# Patient Record
Sex: Female | Born: 1945
Health system: Southern US, Community
[De-identification: ages and names within clinical notes are randomized; demographics above are authoritative.]

## PROBLEM LIST (undated history)

## (undated) DIAGNOSIS — M810 Age-related osteoporosis without current pathological fracture: Secondary | ICD-10-CM

## (undated) DIAGNOSIS — D7289 Other specified disorders of white blood cells: Secondary | ICD-10-CM

## (undated) DIAGNOSIS — E876 Hypokalemia: Secondary | ICD-10-CM

## (undated) DIAGNOSIS — M1990 Unspecified osteoarthritis, unspecified site: Secondary | ICD-10-CM

## (undated) DIAGNOSIS — G43909 Migraine, unspecified, not intractable, without status migrainosus: Secondary | ICD-10-CM

## (undated) DIAGNOSIS — J4489 Other specified chronic obstructive pulmonary disease: Secondary | ICD-10-CM

## (undated) DIAGNOSIS — F32A Depression, unspecified: Secondary | ICD-10-CM

## (undated) DIAGNOSIS — J438 Other emphysema: Secondary | ICD-10-CM

## (undated) DIAGNOSIS — M7750 Other enthesopathy of unspecified foot: Secondary | ICD-10-CM

## (undated) DIAGNOSIS — H269 Unspecified cataract: Secondary | ICD-10-CM

## (undated) DIAGNOSIS — I10 Essential (primary) hypertension: Secondary | ICD-10-CM

## (undated) DIAGNOSIS — E785 Hyperlipidemia, unspecified: Secondary | ICD-10-CM

## (undated) DIAGNOSIS — R930 Abnormal findings on diagnostic imaging of skull and head, not elsewhere classified: Secondary | ICD-10-CM

## (undated) DIAGNOSIS — F329 Major depressive disorder, single episode, unspecified: Secondary | ICD-10-CM

## (undated) DIAGNOSIS — R002 Palpitations: Secondary | ICD-10-CM

## (undated) DIAGNOSIS — R0902 Hypoxemia: Secondary | ICD-10-CM

## (undated) DIAGNOSIS — F3289 Other specified depressive episodes: Secondary | ICD-10-CM

## (undated) DIAGNOSIS — D72829 Elevated white blood cell count, unspecified: Secondary | ICD-10-CM

## (undated) DIAGNOSIS — G709 Myoneural disorder, unspecified: Secondary | ICD-10-CM

## (undated) DIAGNOSIS — J439 Emphysema, unspecified: Secondary | ICD-10-CM

## (undated) DIAGNOSIS — Z720 Tobacco use: Secondary | ICD-10-CM

## (undated) DIAGNOSIS — A770 Spotted fever due to Rickettsia rickettsii: Secondary | ICD-10-CM

## (undated) DIAGNOSIS — E079 Disorder of thyroid, unspecified: Secondary | ICD-10-CM

## (undated) DIAGNOSIS — J449 Chronic obstructive pulmonary disease, unspecified: Secondary | ICD-10-CM

## (undated) HISTORY — DX: Other specified chronic obstructive pulmonary disease: J44.89

## (undated) HISTORY — DX: Other enthesopathy of unspecified foot and ankle: M77.50

## (undated) HISTORY — DX: Essential (primary) hypertension: I10

## (undated) HISTORY — DX: Migraine, unspecified, not intractable, without status migrainosus: G43.909

## (undated) HISTORY — DX: Other emphysema: J43.8

## (undated) HISTORY — DX: Myoneural disorder, unspecified: G70.9

## (undated) HISTORY — DX: Other specified disorders of white blood cells: D72.89

## (undated) HISTORY — DX: Elevated white blood cell count, unspecified: D72.829

## (undated) HISTORY — DX: Palpitations: R00.2

## (undated) HISTORY — DX: Hypokalemia: E87.6

## (undated) HISTORY — DX: Unspecified cataract: H26.9

## (undated) HISTORY — PX: TUBAL LIGATION: SHX77

## (undated) HISTORY — DX: Age-related osteoporosis without current pathological fracture: M81.0

## (undated) HISTORY — DX: Other specified depressive episodes: F32.89

## (undated) HISTORY — DX: Abnormal findings on diagnostic imaging of skull and head, not elsewhere classified: R93.0

## (undated) HISTORY — DX: Major depressive disorder, single episode, unspecified: F32.9

## (undated) HISTORY — DX: Unspecified osteoarthritis, unspecified site: M19.90

## (undated) HISTORY — DX: Hypoxemia: R09.02

## (undated) HISTORY — DX: Hyperlipidemia, unspecified: E78.5

## (undated) HISTORY — DX: Disorder of thyroid, unspecified: E07.9

## (undated) HISTORY — PX: EYE SURGERY: SHX253

## (undated) HISTORY — DX: Emphysema, unspecified: J43.9

## (undated) HISTORY — DX: Depression, unspecified: F32.A

## (undated) HISTORY — DX: Tobacco use: Z72.0

## (undated) HISTORY — PX: FRACTURE SURGERY: SHX138

## (undated) HISTORY — DX: Chronic obstructive pulmonary disease, unspecified: J44.9

---
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
## 1997-09-18 ENCOUNTER — Emergency Department (HOSPITAL_COMMUNITY): Admission: EM | Admit: 1997-09-18 | Discharge: 1997-09-18 | Payer: Self-pay | Admitting: Emergency Medicine

## 1998-08-02 ENCOUNTER — Emergency Department (HOSPITAL_COMMUNITY): Admission: EM | Admit: 1998-08-02 | Discharge: 1998-08-02 | Payer: Self-pay | Admitting: Emergency Medicine

## 1999-07-24 ENCOUNTER — Emergency Department (HOSPITAL_COMMUNITY): Admission: EM | Admit: 1999-07-24 | Discharge: 1999-07-24 | Payer: Self-pay | Admitting: Emergency Medicine

## 1999-07-24 ENCOUNTER — Encounter: Payer: Self-pay | Admitting: Emergency Medicine

## 2000-02-15 ENCOUNTER — Other Ambulatory Visit: Admission: RE | Admit: 2000-02-15 | Discharge: 2000-02-15 | Payer: Self-pay | Admitting: Gynecology

## 2000-08-07 ENCOUNTER — Encounter: Admission: RE | Admit: 2000-08-07 | Discharge: 2000-08-07 | Payer: Self-pay | Admitting: Gynecology

## 2000-08-07 ENCOUNTER — Encounter: Payer: Self-pay | Admitting: Gynecology

## 2000-12-27 ENCOUNTER — Encounter: Payer: Self-pay | Admitting: Emergency Medicine

## 2000-12-27 ENCOUNTER — Inpatient Hospital Stay (HOSPITAL_COMMUNITY): Admission: EM | Admit: 2000-12-27 | Discharge: 2000-12-30 | Payer: Self-pay | Admitting: Emergency Medicine

## 2001-08-12 ENCOUNTER — Encounter: Payer: Self-pay | Admitting: Internal Medicine

## 2001-08-12 ENCOUNTER — Encounter: Admission: RE | Admit: 2001-08-12 | Discharge: 2001-08-12 | Payer: Self-pay | Admitting: Internal Medicine

## 2002-07-16 ENCOUNTER — Other Ambulatory Visit: Admission: RE | Admit: 2002-07-16 | Discharge: 2002-07-16 | Payer: Self-pay | Admitting: Gynecology

## 2002-08-15 ENCOUNTER — Encounter: Payer: Self-pay | Admitting: Gynecology

## 2002-08-15 ENCOUNTER — Encounter: Admission: RE | Admit: 2002-08-15 | Discharge: 2002-08-15 | Payer: Self-pay | Admitting: Gynecology

## 2003-04-12 ENCOUNTER — Emergency Department (HOSPITAL_COMMUNITY): Admission: EM | Admit: 2003-04-12 | Discharge: 2003-04-12 | Payer: Self-pay | Admitting: Emergency Medicine

## 2003-07-20 ENCOUNTER — Other Ambulatory Visit: Admission: RE | Admit: 2003-07-20 | Discharge: 2003-07-20 | Payer: Self-pay | Admitting: Gynecology

## 2003-09-04 ENCOUNTER — Encounter: Admission: RE | Admit: 2003-09-04 | Discharge: 2003-09-04 | Payer: Self-pay | Admitting: Gynecology

## 2005-03-29 ENCOUNTER — Encounter: Admission: RE | Admit: 2005-03-29 | Discharge: 2005-03-29 | Payer: Self-pay | Admitting: Gynecology

## 2005-03-29 ENCOUNTER — Other Ambulatory Visit: Admission: RE | Admit: 2005-03-29 | Discharge: 2005-03-29 | Payer: Self-pay | Admitting: Gynecology

## 2005-09-06 ENCOUNTER — Encounter: Admission: RE | Admit: 2005-09-06 | Discharge: 2005-09-06 | Payer: Self-pay | Admitting: Orthopedic Surgery

## 2006-04-23 ENCOUNTER — Encounter: Admission: RE | Admit: 2006-04-23 | Discharge: 2006-04-23 | Payer: Self-pay | Admitting: Gynecology

## 2006-04-23 ENCOUNTER — Other Ambulatory Visit: Admission: RE | Admit: 2006-04-23 | Discharge: 2006-04-23 | Payer: Self-pay | Admitting: Gynecology

## 2007-05-01 ENCOUNTER — Encounter: Admission: RE | Admit: 2007-05-01 | Discharge: 2007-05-01 | Payer: Self-pay | Admitting: Gynecology

## 2007-07-10 LAB — HM DEXA SCAN

## 2008-05-07 ENCOUNTER — Encounter: Admission: RE | Admit: 2008-05-07 | Discharge: 2008-05-07 | Payer: Self-pay | Admitting: Gynecology

## 2008-07-22 ENCOUNTER — Ambulatory Visit: Payer: Self-pay | Admitting: Internal Medicine

## 2008-07-23 ENCOUNTER — Telehealth: Payer: Self-pay | Admitting: Internal Medicine

## 2008-08-12 ENCOUNTER — Telehealth: Payer: Self-pay | Admitting: Internal Medicine

## 2008-08-14 ENCOUNTER — Ambulatory Visit: Payer: Self-pay | Admitting: Internal Medicine

## 2008-08-14 HISTORY — PX: COLONOSCOPY: SHX174

## 2009-04-06 LAB — HM COLONOSCOPY: HM Colonoscopy: NORMAL

## 2009-05-10 ENCOUNTER — Encounter: Admission: RE | Admit: 2009-05-10 | Discharge: 2009-05-10 | Payer: Self-pay | Admitting: Gynecology

## 2010-06-03 ENCOUNTER — Other Ambulatory Visit: Payer: Self-pay | Admitting: Gynecology

## 2010-06-03 DIAGNOSIS — Z1231 Encounter for screening mammogram for malignant neoplasm of breast: Secondary | ICD-10-CM

## 2010-06-21 ENCOUNTER — Ambulatory Visit
Admission: RE | Admit: 2010-06-21 | Discharge: 2010-06-21 | Disposition: A | Discharge status: Home / Self Care | Payer: Medicare Other | Source: Ambulatory Visit | Attending: Gynecology | Admitting: Gynecology

## 2010-06-21 DIAGNOSIS — Z1231 Encounter for screening mammogram for malignant neoplasm of breast: Secondary | ICD-10-CM

## 2010-08-19 NOTE — Discharge Summary (Signed)
[place]  Patient:    BARB, SHEAR Visit Number: [id number] MRN: [id number]          Service Type: TRA Location: [id number] Attending Physician:  Trauma, Md Dictated by:   Shawn Rayburn, P.A. Admit Date:  12/26/2000 Discharge Date: 12/30/2000                             Discharge Summary  ADMITTING TRAUMA SURGEON:  Dr. Johna Sheriff.  CONSULTANTS:  None.  DISCHARGE DIAGNOSES: 1. Blunt abdominal and chest trauma. 2. Spleen laceration. 3. Liver laceration. 4. Left eighth rib fracture. 5. Pharyngitis.  HISTORY:  This is a 65 year old female who was kicked by a horse in the left upper quadrant at approximately 9:30 p.m. on the day of admission, December 27, 2000.  She was brought to the [facility] Emergency Room by EMS with stable vital signs and complaining of diffuse abdominal pain.  On exam she had moderate diffuse tenderness about the abdomen and a bruise over the left upper quadrant.  Workup at this time, chest x-ray showed a fracture of the left eighth rib with no pneumothorax.  CT of the abdomen showed a minor laceration to the left lobe of the liver and laceration to the lower pole of the spleen with moderate blood in the pelvis.  The patient was admitted for observation for bleeding and occult bowel injury as well as for pain control.  The patient remained hemodynamically stable throughout this admission.  Her hemoglobin and hematocrit have stabilized with a hemoglobin of 11.9, hematocrit of 35.1, admission hemoglobin was 14.0, hematocrit 42.0.  White blood cell count 9100 and platelets of 243,000.  The patient was mobilized and tolerated this well.  She is taking Darvocet only for pain.  Her main complaint at the time of discharge was pharyngitis.  She did have some blistering and erythema, but no exudates in her throat.  This was cultured and the patient will be empirically sent home on amoxicillin.  DISCHARGE  MEDICATIONS:  Other medications at the time of discharge include: 1. Darvocet-N 100 one to two p.o. q.4-6h. p.r.n. pain. 2. Prozac per usual home dose.  ACTIVITY:  To tolerance.  No driving until follow up with trauma service.  FOLLOW-UP:  Follow up with trauma service on October 8, at 9:30 in the morning. Dictated by:   Shawn Rayburn, P.A. Attending Physician:  Trauma, Md DD:  12/30/00 TD:  12/30/00 Job: [id number]

## 2010-09-25 ENCOUNTER — Emergency Department (HOSPITAL_COMMUNITY): Payer: Medicare Other

## 2010-09-25 ENCOUNTER — Emergency Department (HOSPITAL_COMMUNITY)
Admission: EM | Admit: 2010-09-25 | Discharge: 2010-09-25 | Disposition: A | Discharge status: Home / Self Care | Payer: Medicare Other | Attending: Emergency Medicine | Admitting: Emergency Medicine

## 2010-09-25 DIAGNOSIS — R059 Cough, unspecified: Secondary | ICD-10-CM | POA: Insufficient documentation

## 2010-09-25 DIAGNOSIS — I10 Essential (primary) hypertension: Secondary | ICD-10-CM | POA: Insufficient documentation

## 2010-09-25 DIAGNOSIS — J4489 Other specified chronic obstructive pulmonary disease: Secondary | ICD-10-CM | POA: Insufficient documentation

## 2010-09-25 DIAGNOSIS — F17200 Nicotine dependence, unspecified, uncomplicated: Secondary | ICD-10-CM | POA: Insufficient documentation

## 2010-09-25 DIAGNOSIS — R05 Cough: Secondary | ICD-10-CM | POA: Insufficient documentation

## 2010-09-25 DIAGNOSIS — E876 Hypokalemia: Secondary | ICD-10-CM | POA: Insufficient documentation

## 2010-09-25 DIAGNOSIS — J449 Chronic obstructive pulmonary disease, unspecified: Secondary | ICD-10-CM | POA: Insufficient documentation

## 2010-09-25 DIAGNOSIS — R0602 Shortness of breath: Secondary | ICD-10-CM | POA: Insufficient documentation

## 2010-09-25 DIAGNOSIS — E7800 Pure hypercholesterolemia, unspecified: Secondary | ICD-10-CM | POA: Insufficient documentation

## 2010-09-25 LAB — DIFFERENTIAL
Basophils Absolute: 0 10*3/uL (ref 0.0–0.1)
Basophils Relative: 0 % (ref 0–1)
Eosinophils Absolute: 0.3 10*3/uL (ref 0.0–0.7)
Eosinophils Relative: 2 % (ref 0–5)
Lymphocytes Relative: 26 % (ref 12–46)
Lymphs Abs: 3 10*3/uL (ref 0.7–4.0)
Monocytes Absolute: 0.7 10*3/uL (ref 0.1–1.0)
Monocytes Relative: 6 % (ref 3–12)
Neutro Abs: 7.4 10*3/uL (ref 1.7–7.7)
Neutrophils Relative %: 65 % (ref 43–77)

## 2010-09-25 LAB — D-DIMER, QUANTITATIVE: D-Dimer, Quant: 0.32 ug/mL-FEU (ref 0.00–0.48)

## 2010-09-25 LAB — CBC
HCT: 42 % (ref 36.0–46.0)
Hemoglobin: 14.7 g/dL (ref 12.0–15.0)
MCH: 31.7 pg (ref 26.0–34.0)
MCHC: 35 g/dL (ref 30.0–36.0)
RBC: 4.64 MIL/uL (ref 3.87–5.11)
RDW: 13.2 % (ref 11.5–15.5)

## 2010-09-25 LAB — BASIC METABOLIC PANEL
BUN: 13 mg/dL (ref 6–23)
CO2: 27 mEq/L (ref 19–32)
Calcium: 8.9 mg/dL (ref 8.4–10.5)
Chloride: 101 mEq/L (ref 96–112)
Creatinine, Ser: 0.8 mg/dL (ref 0.50–1.10)
GFR calc Af Amer: >60 mL/min (ref >60)
GFR calc non Af Amer: >60 mL/min (ref >60)
Glucose, Bld: 143 mg/dL — ABNORMAL HIGH (ref 70–99)
Sodium: 138 mEq/L (ref 135–145)

## 2010-09-25 LAB — CK TOTAL AND CKMB (NOT AT ARMC)
CK, MB: 1.8 ng/mL (ref 0.3–4.0)
Total CK: 65 U/L (ref 7–177)

## 2010-09-25 LAB — PRO B NATRIURETIC PEPTIDE: Pro B Natriuretic peptide (BNP): 55.9 pg/mL (ref 0–125)

## 2010-09-25 LAB — TROPONIN I: Troponin I: <0.3 ng/mL (ref <0.30)

## 2010-10-27 ENCOUNTER — Ambulatory Visit
Admission: RE | Admit: 2010-10-27 | Discharge: 2010-10-27 | Disposition: A | Discharge status: Home / Self Care | Payer: Medicare Other | Source: Ambulatory Visit | Attending: Internal Medicine | Admitting: Internal Medicine

## 2010-10-27 ENCOUNTER — Other Ambulatory Visit: Payer: Self-pay | Admitting: Internal Medicine

## 2010-10-27 DIAGNOSIS — Z09 Encounter for follow-up examination after completed treatment for conditions other than malignant neoplasm: Secondary | ICD-10-CM

## 2010-11-09 ENCOUNTER — Encounter: Payer: Self-pay | Admitting: Internal Medicine

## 2010-11-10 ENCOUNTER — Ambulatory Visit (INDEPENDENT_AMBULATORY_CARE_PROVIDER_SITE_OTHER): Payer: Medicare Other | Admitting: Internal Medicine

## 2010-11-10 ENCOUNTER — Encounter: Payer: Self-pay | Admitting: Internal Medicine

## 2010-11-10 VITALS — BP 118/82 | HR 81 | Temp 98.0°F | Ht 65.0 in | Wt 154.8 lb

## 2010-11-10 DIAGNOSIS — D72829 Elevated white blood cell count, unspecified: Secondary | ICD-10-CM

## 2010-11-10 DIAGNOSIS — F17210 Nicotine dependence, cigarettes, uncomplicated: Secondary | ICD-10-CM | POA: Insufficient documentation

## 2010-11-10 DIAGNOSIS — Z720 Tobacco use: Secondary | ICD-10-CM

## 2010-11-10 DIAGNOSIS — F17200 Nicotine dependence, unspecified, uncomplicated: Secondary | ICD-10-CM

## 2010-11-10 DIAGNOSIS — J449 Chronic obstructive pulmonary disease, unspecified: Secondary | ICD-10-CM

## 2010-11-10 DIAGNOSIS — J4489 Other specified chronic obstructive pulmonary disease: Secondary | ICD-10-CM

## 2010-11-10 NOTE — Assessment & Plan Note (Signed)
#  COPD I think copd/emphysema explains shortness of breath and coughing Nurse will walk you for oxygen levels Please have full PFT breathing test and return to see me; if my appointment is too far out you can see me NP if you wish just to get started on medication

## 2010-11-10 NOTE — Patient Instructions (Signed)
#  COPD I think copd/emphysema explains shortness of breath and coughing Nurse will walk you for oxygen levels Please have full PFT breathing test and return to see me; if my appointment is too far out you can see me NP if you wish just to get started on medication #High white count  - not sure if related to lungs  - please follow with hematologist #Smoking - please work on quitting smoking  - write down some ways you can cut down smoking or quit and bring it with you at next visit #Followup - after PFT

## 2010-11-10 NOTE — Assessment & Plan Note (Signed)
#  High white count  - not sure if related to lungs  - please follow with hematologist

## 2010-11-10 NOTE — Progress Notes (Signed)
Subjective:    Patient ID: Gabrielle White, female    DOB: [birth year], 65 y.o.   MRN: [id number]  HPI IOV 11/10/2010: 65 year old female. Smoker. Referred by Dr. Allena Katz for possible copd.   Reports insidious onset chronic cough for few years. Slowly progressive. Moderate in severity. Associated thin white sputum + of small amounts present. Associated dogs, cats and horses present but she is not sure that this makes cough worse though she feels that the house she has lived in for past 5 years might be related to cough onset though she does not know what. States house is clean though dog hair present. Cough made worse occ by lying down and cig smoke but not always. Cough improved by a 5 day prednisone course in end June 2012 when she went to ER for acute dyspnea. Of note, Kouffman Reflux Symptom Index Score (RSI) is 11 and therefore against dx of LPR.   Also, reports 1  Year chronic dyspnea of insidious onset. Heat and activity makes dyspnea worse. Walking dogs 1/2 mile is no problem but any heavier activity more than walking makes her more dyspneic. Also, progressive. Rates dyspnea as moderate. Improved by rest  On 09/25/2010 wennt to ER with acute dyspnea after being in horse stall - ddimer, ck, ekg, trop (reviewed) all normal. CXR showed emphysema and was sent home on 5 day prednisone that helped immensely ("oh yeah"). Note, did not desaturate walking 185 feet x 3 laps in office  Ongoing tobacco abuse. Quit for 15 years at age 31. 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 Interested in quitting smoking. Has taken chantix in past but does not remember if any problems with it.   Denies associated chest pain, gerd, edema, sputum, inhaler intake but noted to have WC 11.5k - 15K past 2 months on labs. Heme cx pending per hx   Review of Systems  Constitutional: Negative for fever and unexpected weight change.  HENT: Negative for ear pain, nosebleeds,  congestion, sore throat, rhinorrhea, sneezing, trouble swallowing, dental problem, postnasal drip and sinus pressure.   Eyes: Negative for redness and itching.  Respiratory: Positive for cough and shortness of breath. Negative for chest tightness and wheezing.   Cardiovascular: Negative for palpitations and leg swelling.  Gastrointestinal: Negative for nausea and vomiting.  Genitourinary: Negative for dysuria.  Musculoskeletal: Negative for joint swelling.  Skin: Negative for rash.  Neurological: Negative for headaches.  Hematological: Does not bruise/bleed easily.  Psychiatric/Behavioral: Negative for dysphoric mood. The patient is not nervous/anxious.        Objective:   Physical Exam  Vitals reviewed. Constitutional: She is oriented to person, place, and time. She appears well-developed and well-nourished. No distress.  HENT:  Head: Normocephalic and atraumatic.  Right Ear: External ear normal.  Left Ear: External ear normal.  Mouth/Throat: Oropharynx is clear and moist. No oropharyngeal exudate.  Eyes: Conjunctivae and EOM are normal. Pupils are equal, round, and reactive to light. Right eye exhibits no discharge. Left eye exhibits no discharge. No scleral icterus.  Neck: Normal range of motion. Neck supple. No JVD present. No tracheal deviation present. No thyromegaly present.  Cardiovascular: Normal rate, regular rhythm, normal heart sounds and intact distal pulses.  Exam reveals no gallop and no friction rub.   No murmur heard. Pulmonary/Chest: Effort normal and breath sounds normal. No respiratory distress. She has no wheezes. She has no rales. She exhibits no tenderness.  Abdominal: Soft. Bowel sounds are  normal. She exhibits no distension and no mass. There is no tenderness. There is no rebound and no guarding.  Musculoskeletal: Normal range of motion. She exhibits no edema and no tenderness.  Lymphadenopathy:    She has no cervical adenopathy.  Neurological: She is alert  and oriented to person, place, and time. She has normal reflexes. No cranial nerve deficit. She exhibits normal muscle tone. Coordination normal.  Skin: Skin is warm and dry. No rash noted. She is not diaphoretic. No erythema. No pallor.  Psychiatric: She has a normal mood and affect. Her behavior is normal. Judgment and thought content normal.          Assessment & Plan:

## 2010-11-10 NOTE — Assessment & Plan Note (Signed)
#  Smoking - please work on quitting smoking  - write down some ways you can cut down smoking or quit and bring it with you at next visit #Followup - after PFT

## 2010-11-18 ENCOUNTER — Encounter: Payer: Self-pay | Admitting: Adult Health

## 2010-11-18 ENCOUNTER — Other Ambulatory Visit (HOSPITAL_COMMUNITY): Payer: Self-pay | Admitting: Oncology

## 2010-11-18 ENCOUNTER — Encounter (HOSPITAL_BASED_OUTPATIENT_CLINIC_OR_DEPARTMENT_OTHER): Payer: Medicare Other | Admitting: Oncology

## 2010-11-18 ENCOUNTER — Ambulatory Visit (INDEPENDENT_AMBULATORY_CARE_PROVIDER_SITE_OTHER): Payer: Medicare Other | Admitting: Internal Medicine

## 2010-11-18 ENCOUNTER — Ambulatory Visit (INDEPENDENT_AMBULATORY_CARE_PROVIDER_SITE_OTHER): Payer: Medicare Other | Admitting: Adult Health

## 2010-11-18 VITALS — BP 112/74 | HR 71 | Temp 97.3°F | Ht 64.0 in | Wt 156.0 lb

## 2010-11-18 DIAGNOSIS — J449 Chronic obstructive pulmonary disease, unspecified: Secondary | ICD-10-CM

## 2010-11-18 DIAGNOSIS — R739 Hyperglycemia, unspecified: Secondary | ICD-10-CM | POA: Insufficient documentation

## 2010-11-18 DIAGNOSIS — D72829 Elevated white blood cell count, unspecified: Secondary | ICD-10-CM

## 2010-11-18 LAB — CBC WITH DIFFERENTIAL/PLATELET
BASO%: 1.1 % (ref 0.0–2.0)
EOS%: 2 % (ref 0.0–7.0)
HCT: 40.7 % (ref 34.8–46.6)
LYMPH%: 28.3 % (ref 14.0–49.7)
MCH: 32 pg (ref 25.1–34.0)
MCHC: 34.2 g/dL (ref 31.5–36.0)
MONO#: 0.5 10*3/uL (ref 0.1–0.9)
MONO%: 4.2 % (ref 0.0–14.0)
NEUT%: 64.4 % (ref 38.4–76.8)
Platelets: 235 10*3/uL (ref 145–400)
RBC: 4.35 10*6/uL (ref 3.70–5.45)
WBC: 11.3 10*3/uL — ABNORMAL HIGH (ref 3.9–10.3)

## 2010-11-18 LAB — PULMONARY FUNCTION TEST

## 2010-11-18 LAB — CHCC SMEAR

## 2010-11-18 NOTE — Progress Notes (Signed)
PFT done today. 

## 2010-11-18 NOTE — Patient Instructions (Addendum)
Most important goal is to quit smoking  Try a lot of the helpful quit smoking suggestions we discussed.  follow up Dr. Marchelle Gearing in 3 months and As needed

## 2010-11-19 LAB — SEDIMENTATION RATE: Sed Rate: 1 mm/h (ref 0–22)

## 2010-11-19 LAB — COMPREHENSIVE METABOLIC PANEL WITH GFR
ALT: 12 U/L (ref 0–35)
AST: 14 U/L (ref 0–37)
Albumin: 4.1 g/dL (ref 3.5–5.2)
Alkaline Phosphatase: 90 U/L (ref 39–117)
BUN: 11 mg/dL (ref 6–23)
CO2: 29 meq/L (ref 19–32)
Calcium: 9.4 mg/dL (ref 8.4–10.5)
Chloride: 101 meq/L (ref 96–112)
Creatinine, Ser: 0.82 mg/dL (ref 0.50–1.10)
Glucose, Bld: 162 mg/dL — ABNORMAL HIGH (ref 70–99)
Potassium: 3.7 meq/L (ref 3.5–5.3)
Sodium: 141 meq/L (ref 135–145)
Total Bilirubin: 0.5 mg/dL (ref 0.3–1.2)
Total Protein: 6.2 g/dL (ref 6.0–8.3)

## 2010-11-24 NOTE — Progress Notes (Signed)
Subjective:    Patient ID: Gabrielle White, female    DOB: 12-05-1945, 65 y.o.   MRN: [id number]  HPI IOV 11/10/2010: 65 year old female. Smoker. Referred by Dr. Allena Katz for possible copd.   Reports insidious onset chronic cough for few years. Slowly progressive. Moderate in severity. Associated thin white sputum + of small amounts present. Associated dogs, cats and horses present but she is not sure that this makes cough worse though she feels that the house she has lived in for past 5 years might be related to cough onset though she does not know what. States house is clean though dog hair present. Cough made worse occ by lying down and cig smoke but not always. Cough improved by a 5 day prednisone course in end June 2012 when she went to ER for acute dyspnea. Of note, Kouffman Reflux Symptom Index Score (RSI) is 11 and therefore against dx of LPR.  Also, reports 1  Year chronic dyspnea of insidious onset. Heat and activity makes dyspnea worse. Walking dogs 1/2 mile is no problem but any heavier activity more than walking makes her more dyspneic. Also, progressive. Rates dyspnea as moderate. Improved by rest  On   09/25/2010 wennt to ER with acute dyspnea after being in horse stall - ddimer, ck, ekg, trop (reviewed) all normal. CXR showed emphysema and was sent home on 5 day prednisone that helped immensely ("oh yeah"). Note, did not desaturate walking 185 feet x 3 laps in office Ongoing tobacco abuse. Quit for 15 years at age 30. 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 Interested in quitting smoking. Has taken chantix in past but does not remember if any problems with it.  Denies associated chest pain, gerd, edema, sputum, inhaler intake but noted to have WC 11.5k - 15K past 2 months on labs. Heme cx pending per hx  11/18/10 Follow up and PFT review Pt returns for follow up . Today PFTs showed. FEV1 of 1.91 L (90%), ratio of 73, DLCO 75%, no sign change  with SABA .  She continues to smoke. We discussed several options for smoking cesstation w/ pt education .  She is maintained on Symbicort and tolerating well with no increased use of SABA .   Review of Systems  Constitutional: Negative for fever and unexpected weight change.  HENT: Negative for ear pain, nosebleeds, congestion, sore throat, rhinorrhea, sneezing, trouble swallowing, dental problem, postnasal drip and sinus pressure.   Eyes: Negative for redness and itching.  Respiratory: Positive for cough and shortness of breath. Negative for chest tightness and wheezing.   Cardiovascular: Negative for palpitations and leg swelling.  Gastrointestinal: Negative for nausea and vomiting.  Genitourinary: Negative for dysuria.  Musculoskeletal: Negative for joint swelling.  Skin: Negative for rash.  Neurological: Negative for headaches.  Hematological: Does not bruise/bleed easily.  Psychiatric/Behavioral: Negative for dysphoric mood. The patient is not nervous/anxious.        Objective:   Physical Exam  GEN: A/Ox3; pleasant , NAD, well nourished   HEENT:  [place]/AT,  EACs-clear, TMs-wnl, NOSE-clear, THROAT-clear, no lesions, no postnasal drip or exudate noted.   NECK:  Supple w/ fair ROM; no JVD; normal carotid impulses w/o bruits; no thyromegaly or nodules palpated; no lymphadenopathy.  RESP  Clear  P & A; w/o, wheezes/ rales/ or rhonchi.no accessory muscle use, no dullness to percussion  CARD:  RRR, no m/r/g  , no peripheral edema, pulses intact, no cyanosis or clubbing.  GI:   Soft & nt; nml bowel sounds; no organomegaly or masses detected.  Musco: Warm bil, no deformities or joint swelling noted.   Neuro: alert, no focal deficits noted.    Skin: Warm, no lesions or rashes          Assessment & Plan:

## 2010-11-24 NOTE — Assessment & Plan Note (Addendum)
PFT today shows preserved lung fxn . We discussed that her main goal is to quit smoking.  Long discussion on smoking cesstation   Plan:  Most important goal is to quit smoking  Try a lot of the helpful quit smoking suggestions we discussed.  follow up Dr. Marchelle Gearing in 3 months and As needed

## 2010-11-25 NOTE — Progress Notes (Signed)
Reviweed spirometry which is normal ? Effect of drug. Smoking cessation main issue . I agree with NP Plan

## 2010-12-13 ENCOUNTER — Encounter: Payer: Self-pay | Admitting: Internal Medicine

## 2010-12-19 ENCOUNTER — Encounter (HOSPITAL_BASED_OUTPATIENT_CLINIC_OR_DEPARTMENT_OTHER): Payer: Medicare Other | Admitting: Oncology

## 2010-12-19 ENCOUNTER — Other Ambulatory Visit (HOSPITAL_COMMUNITY): Payer: Self-pay | Admitting: Oncology

## 2010-12-19 DIAGNOSIS — D72829 Elevated white blood cell count, unspecified: Secondary | ICD-10-CM

## 2010-12-19 LAB — CBC WITH DIFFERENTIAL/PLATELET
BASO%: 0.4 % (ref 0.0–2.0)
HCT: 43 % (ref 34.8–46.6)
LYMPH%: 34.2 % (ref 14.0–49.7)
MCHC: 34.4 g/dL (ref 31.5–36.0)
MCV: 93 fL (ref 79.5–101.0)
MONO#: 0.7 10*3/uL (ref 0.1–0.9)
MONO%: 6.6 % (ref 0.0–14.0)
NEUT%: 56.4 % (ref 38.4–76.8)
Platelets: 262 10*3/uL (ref 145–400)
WBC: 10.9 10*3/uL — ABNORMAL HIGH (ref 3.9–10.3)

## 2011-01-13 ENCOUNTER — Encounter (HOSPITAL_BASED_OUTPATIENT_CLINIC_OR_DEPARTMENT_OTHER): Payer: Medicare Other | Admitting: Oncology

## 2011-01-13 ENCOUNTER — Other Ambulatory Visit (HOSPITAL_COMMUNITY): Payer: Self-pay | Admitting: Oncology

## 2011-01-13 DIAGNOSIS — D72829 Elevated white blood cell count, unspecified: Secondary | ICD-10-CM

## 2011-01-13 LAB — CBC WITH DIFFERENTIAL/PLATELET
BASO%: 0.4 % (ref 0.0–2.0)
EOS%: 3.6 % (ref 0.0–7.0)
HCT: 44.6 % (ref 34.8–46.6)
LYMPH%: 36.5 % (ref 14.0–49.7)
MCH: 31.6 pg (ref 25.1–34.0)
MCHC: 34 g/dL (ref 31.5–36.0)
NEUT%: 52.2 % (ref 38.4–76.8)
Platelets: 257 10*3/uL (ref 145–400)
RBC: 4.79 10*6/uL (ref 3.70–5.45)
lymph#: 4.2 10*3/uL — ABNORMAL HIGH (ref 0.9–3.3)

## 2011-02-03 ENCOUNTER — Ambulatory Visit: Payer: Medicare Other | Admitting: Internal Medicine

## 2011-02-03 ENCOUNTER — Encounter: Payer: Self-pay | Admitting: Internal Medicine

## 2011-02-03 ENCOUNTER — Telehealth: Payer: Self-pay | Admitting: *Deleted

## 2011-02-03 ENCOUNTER — Ambulatory Visit (INDEPENDENT_AMBULATORY_CARE_PROVIDER_SITE_OTHER): Payer: Medicare Other | Admitting: Internal Medicine

## 2011-02-03 VITALS — BP 110/70 | HR 81 | Temp 98.3°F | Ht 65.0 in | Wt 157.8 lb

## 2011-02-03 DIAGNOSIS — Z720 Tobacco use: Secondary | ICD-10-CM

## 2011-02-03 DIAGNOSIS — R0989 Other specified symptoms and signs involving the circulatory and respiratory systems: Secondary | ICD-10-CM

## 2011-02-03 DIAGNOSIS — R0600 Dyspnea, unspecified: Secondary | ICD-10-CM

## 2011-02-03 DIAGNOSIS — R05 Cough: Secondary | ICD-10-CM

## 2011-02-03 DIAGNOSIS — R0609 Other forms of dyspnea: Secondary | ICD-10-CM

## 2011-02-03 DIAGNOSIS — J449 Chronic obstructive pulmonary disease, unspecified: Secondary | ICD-10-CM | POA: Insufficient documentation

## 2011-02-03 DIAGNOSIS — R059 Cough, unspecified: Secondary | ICD-10-CM

## 2011-02-03 DIAGNOSIS — F17200 Nicotine dependence, unspecified, uncomplicated: Secondary | ICD-10-CM

## 2011-02-03 NOTE — Assessment & Plan Note (Signed)
In June 2012 RSI score was < 15 and against LPR cough. Hx wise sounds much like chronic bronchitis. Symbicort did not help though her compliance was in doubt. PFTs only show mild reduction in dlco. I will get CPST and reassess. Might need to get CT chest depending on CPST results

## 2011-02-03 NOTE — Patient Instructions (Signed)
#  smoking  - try to work on quitting #cough  - unclear why but let us figure out the shortness of breath first #shortness of breath  - unclear why so have CPX bike exercise test with exercise induced challenge for asthma #Followup  - after cpx bike test

## 2011-02-03 NOTE — Progress Notes (Signed)
Subjective:    Patient ID: Gabrielle White, female    DOB: 23-Apr-1945, 65 y.o.   MRN: [id number]  HPI IOV 11/10/2010: 65 year old female. Smoker. Referred by Dr. Allena Katz for possible copd.   Reports insidious onset chronic cough for few years. Slowly progressive. Moderate in severity. Associated thin white sputum + of small amounts present. Associated dogs, cats and horses present but she is not sure that this makes cough worse though she feels that the house she has lived in for past 5 years might be related to cough onset though she does not know what. States house is clean though dog hair present. Cough made worse occ by lying down and cig smoke but not always. Cough improved by a 5 day prednisone course in end June 2012 when she went to ER for acute dyspnea. Of note, Kouffman Reflux Symptom Index Score (RSI) is 11 and therefore against dx of LPR.   Also, reports 1  Year chronic dyspnea of insidious onset. Heat and activity makes dyspnea worse. Walking dogs 1/2 mile is no problem but any heavier activity more than walking makes her more dyspneic. Also, progressive. Rates dyspnea as moderate. Improved by rest  On   09/25/2010 wennt to ER with acute dyspnea after being in horse stall - ddimer, ck, ekg, trop (reviewed) all normal. CXR showed emphysema and was sent home on 5 day prednisone that helped immensely ("oh yeah"). Note, did not desaturate walking 185 feet x 3 laps in office Ongoing tobacco abuse. Quit for 15 years at age 70. 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 Interested in quitting smoking. Has taken chantix in past but does not remember if any problems with it. Denies associated chest pain, gerd, edema, sputum, inhaler intake but noted to have WC 11.5k - 15K past 2 months on labs. Heme cx pending per hx oday  . She continues to smoke. We discussed several options for smoking cesstation w/ pt education . She is maintained on Symbicort and  tolerating well with no increased use of SABA .   REC Most important goal is to quit smoking  Try a lot of the helpful quit smoking suggestions we discussed.  follow up Dr. Marchelle Gearing in 3 months and As needed   OV 02/03/11: Followup smoking, cough, dyspnea.   Still smoking. Unable to quit  Still with cough. Moderate intensity. Increased when she lies down or when she laughs. Does not feel tickle in throat. Associated white-yellow mucus present; small amounts. Occ sinus drainage +. Very rare heartburn +. Overall stable wihtout change   Still dyspneic. Unchanged. Dyspnea with exertion. Relieved by rest. For class 2-3 activities. Did not think symbicort helped when she took it for 1 month and then quit. Not taken symbicort in 2-3 months Also reports 4 dogs and 1 cat in small house indoors   PFTs 11/18/10: FEV1 of 1.91 L (90%), ratio of 73, DLCO  75% No sign change with SABA  Past, social, Family: Feels tired all the time. Worse end of the day. She is wondering if this is related to SSRI, and xanax. Feels bored and lonely other than being on farm and horses. Misses having a female companion physically and emotionally in life. No crying spells. Due to see PMD in dec 2012. Also reports 4 dogs and 1 cat in small house indoors    Review of Systems  Constitutional: Negative for fever and unexpected weight change.  HENT: Negative for ear  pain, nosebleeds, congestion, sore throat, rhinorrhea, sneezing, trouble swallowing, dental problem, postnasal drip and sinus pressure.   Eyes: Negative for redness and itching.  Respiratory: Positive for cough and shortness of breath. Negative for chest tightness and wheezing.   Cardiovascular: Negative for palpitations and leg swelling.  Gastrointestinal: Negative for nausea and vomiting.  Genitourinary: Negative for dysuria.  Musculoskeletal: Negative for joint swelling.  Skin: Negative for rash.  Neurological: Negative for headaches.  Hematological: Does not  bruise/bleed easily.  Psychiatric/Behavioral: Negative for dysphoric mood. The patient is not nervous/anxious.        Objective:   Physical Exam GEN: A/Ox3; pleasant , NAD, well nourished   HEENT:  /AT,  EACs-clear, TMs-wnl, NOSE-clear, THROAT-clear, no lesions, no postnasal drip or exudate noted.   NECK:  Supple w/ fair ROM; no JVD; normal carotid impulses w/o bruits; no thyromegaly or nodules palpated; no lymphadenopathy.  RESP  Clear  P & A; w/o, wheezes/ rales/ or rhonchi.no accessory muscle use, no dullness to percussion  CARD:  RRR, no m/r/g  , no peripheral edema, pulses intact, no cyanosis or clubbing.  GI:   Soft & nt; nml bowel sounds; no organomegaly or masses detected.  Musco: Warm bil, no deformities or joint swelling noted.   Neuro: alert, no focal deficits noted.    Skin: Warm, no lesions or rashes            Assessment & Plan:

## 2011-02-03 NOTE — Assessment & Plan Note (Signed)
PFTs only  Have mild reduction in dlco. CXR is clear. Dyspnea is class 2 and out of proportion to cxr and pft findings. Discussed empiric symbicort but this did not help in past. So, we agreed to move forward with CPST wit EIB challenge. ROV after CPST

## 2011-02-03 NOTE — Assessment & Plan Note (Signed)
Mentioned she has to quit smoking

## 2011-02-03 NOTE — Telephone Encounter (Signed)
Error

## 2011-02-10 ENCOUNTER — Other Ambulatory Visit (HOSPITAL_COMMUNITY): Payer: Self-pay | Admitting: Oncology

## 2011-02-10 ENCOUNTER — Other Ambulatory Visit (HOSPITAL_BASED_OUTPATIENT_CLINIC_OR_DEPARTMENT_OTHER): Payer: Medicare Other

## 2011-02-10 DIAGNOSIS — D72829 Elevated white blood cell count, unspecified: Secondary | ICD-10-CM

## 2011-02-10 LAB — CBC WITH DIFFERENTIAL/PLATELET
BASO%: 0.6 % (ref 0.0–2.0)
Basophils Absolute: 0.1 10*3/uL (ref 0.0–0.1)
Eosinophils Absolute: 0.4 10*3/uL (ref 0.0–0.5)
HCT: 41.7 % (ref 34.8–46.6)
HGB: 14.1 g/dL (ref 11.6–15.9)
LYMPH%: 43.1 % (ref 14.0–49.7)
MCHC: 33.9 g/dL (ref 31.5–36.0)
MONO#: 0.6 10*3/uL (ref 0.1–0.9)
NEUT%: 46.9 % (ref 38.4–76.8)
Platelets: 256 10*3/uL (ref 145–400)
WBC: 10.3 10*3/uL (ref 3.9–10.3)

## 2011-02-13 ENCOUNTER — Encounter (HOSPITAL_COMMUNITY): Payer: Medicare Other

## 2011-02-16 ENCOUNTER — Telehealth: Payer: Self-pay | Admitting: Oncology

## 2011-02-16 NOTE — Telephone Encounter (Signed)
Lvm advising 12/14 appt has been cx'd due to Epic. Advised in vm, we will call back to r/s.

## 2011-02-17 ENCOUNTER — Telehealth: Payer: Self-pay

## 2011-02-17 NOTE — Telephone Encounter (Signed)
Pt called b/c she received a phone call but did not understand the message. Clarified that we cancelled her Dec appt d/t EPIC and will call her to reschedule probably sometime in January.

## 2011-03-06 ENCOUNTER — Telehealth: Payer: Self-pay | Admitting: Oncology

## 2011-03-06 NOTE — Telephone Encounter (Signed)
S/w pt, gave appt 04/17/11 @ 3.30pm.

## 2011-04-13 ENCOUNTER — Telehealth: Payer: Self-pay | Admitting: Oncology

## 2011-04-13 NOTE — Telephone Encounter (Signed)
PT CALLED TO CANCEL HER 04/17/11 LAB/MD AND STATES TAHT SHE WILL C/B TO R/S,ADVISED HER HOW HIS [facility] HAS LOOKING FOR THE NEXT FEW MONTHS AND ENCOURAGED HER TO R/S BUT SHE WOULD NOT.   AOM

## 2011-04-17 ENCOUNTER — Ambulatory Visit: Payer: Medicare Other | Admitting: Oncology

## 2011-04-17 ENCOUNTER — Other Ambulatory Visit: Payer: Medicare Other

## 2011-06-07 ENCOUNTER — Other Ambulatory Visit: Payer: Self-pay | Admitting: Gynecology

## 2011-06-07 DIAGNOSIS — Z1231 Encounter for screening mammogram for malignant neoplasm of breast: Secondary | ICD-10-CM

## 2011-06-23 ENCOUNTER — Ambulatory Visit
Admission: RE | Admit: 2011-06-23 | Discharge: 2011-06-23 | Disposition: A | Discharge status: Home / Self Care | Payer: Medicare Other | Source: Ambulatory Visit | Attending: Gynecology | Admitting: Gynecology

## 2011-06-23 DIAGNOSIS — Z1231 Encounter for screening mammogram for malignant neoplasm of breast: Secondary | ICD-10-CM

## 2012-08-05 ENCOUNTER — Other Ambulatory Visit: Payer: Self-pay

## 2012-08-05 DIAGNOSIS — Z1231 Encounter for screening mammogram for malignant neoplasm of breast: Secondary | ICD-10-CM

## 2012-08-19 ENCOUNTER — Encounter: Payer: Self-pay | Admitting: *Deleted

## 2012-08-20 ENCOUNTER — Encounter: Payer: Self-pay | Admitting: Internal Medicine

## 2012-08-20 ENCOUNTER — Ambulatory Visit (INDEPENDENT_AMBULATORY_CARE_PROVIDER_SITE_OTHER): Payer: 59 | Admitting: Internal Medicine

## 2012-08-20 VITALS — BP 122/84 | HR 70 | Temp 98.3°F | Resp 14 | Ht 65.0 in | Wt 162.6 lb

## 2012-08-20 DIAGNOSIS — R0989 Other specified symptoms and signs involving the circulatory and respiratory systems: Secondary | ICD-10-CM

## 2012-08-20 DIAGNOSIS — R5381 Other malaise: Secondary | ICD-10-CM

## 2012-08-20 DIAGNOSIS — R059 Cough, unspecified: Secondary | ICD-10-CM

## 2012-08-20 DIAGNOSIS — I10 Essential (primary) hypertension: Secondary | ICD-10-CM

## 2012-08-20 DIAGNOSIS — M79609 Pain in unspecified limb: Secondary | ICD-10-CM

## 2012-08-20 DIAGNOSIS — E039 Hypothyroidism, unspecified: Secondary | ICD-10-CM

## 2012-08-20 DIAGNOSIS — R5383 Other fatigue: Secondary | ICD-10-CM

## 2012-08-20 DIAGNOSIS — R05 Cough: Secondary | ICD-10-CM

## 2012-08-20 DIAGNOSIS — E785 Hyperlipidemia, unspecified: Secondary | ICD-10-CM

## 2012-08-20 DIAGNOSIS — F17200 Nicotine dependence, unspecified, uncomplicated: Secondary | ICD-10-CM

## 2012-08-20 DIAGNOSIS — M79645 Pain in left finger(s): Secondary | ICD-10-CM

## 2012-08-20 DIAGNOSIS — R0600 Dyspnea, unspecified: Secondary | ICD-10-CM

## 2012-08-20 DIAGNOSIS — Z720 Tobacco use: Secondary | ICD-10-CM

## 2012-08-20 NOTE — Patient Instructions (Signed)
Stop atorvastatin

## 2012-08-21 ENCOUNTER — Telehealth: Payer: Self-pay | Admitting: *Deleted

## 2012-08-21 DIAGNOSIS — M79645 Pain in left finger(s): Secondary | ICD-10-CM | POA: Insufficient documentation

## 2012-08-21 LAB — COMPREHENSIVE METABOLIC PANEL
ALT: 13 IU/L (ref 0–32)
AST: 13 IU/L (ref 0–40)
Albumin/Globulin Ratio: 2 (ref 1.1–2.5)
Alkaline Phosphatase: 109 IU/L (ref 39–117)
BUN/Creatinine Ratio: 18 (ref 11–26)
Creatinine, Ser: 0.76 mg/dL (ref 0.57–1.00)
GFR calc Af Amer: 94 mL/min/{1.73_m2} (ref >59)
GFR calc non Af Amer: 81 mL/min/{1.73_m2} (ref >59)
Globulin, Total: 2.2 g/dL (ref 1.5–4.5)
Potassium: 4.7 mmol/L (ref 3.5–5.2)
Sodium: 142 mmol/L (ref 134–144)
Total Bilirubin: 0.2 mg/dL (ref 0.0–1.2)

## 2012-08-21 LAB — SPECIMEN STATUS REPORT

## 2012-08-21 LAB — CBC WITH DIFFERENTIAL/PLATELET
Basos: 1 % (ref 0–3)
Eos: 5 % (ref 0–5)
Immature Grans (Abs): 0 10*3/uL (ref 0.0–0.1)
Lymphs: 41 % (ref 14–46)
Neutrophils Relative %: 46 % (ref 40–74)
RBC: 4.75 x10E6/uL (ref 3.77–5.28)
WBC: 10.8 10*3/uL (ref 3.4–10.8)

## 2012-08-21 LAB — LIPID PANEL
HDL: 43 mg/dL (ref >39)
LDL Calculated: 68 mg/dL (ref 0–99)
VLDL Cholesterol Cal: 42 mg/dL — ABNORMAL HIGH (ref 5–40)

## 2012-08-21 NOTE — Telephone Encounter (Signed)
Patient stated she would just wait for [place] to test her oxygen

## 2012-08-21 NOTE — Telephone Encounter (Signed)
Patient called and was wondering about her referrals. I told her that her orthopedic referral will be don this week. As far as the oxygen goes she would have to be seen because we would have to test her oxygen with and with out oxygen,

## 2012-08-21 NOTE — Addendum Note (Signed)
Addended by: Kimber Relic on: 08/21/2012 10:12 AM   Modules accepted: Orders

## 2012-08-21 NOTE — Progress Notes (Signed)
  Subjective:    Patient ID: Gabrielle White, female    DOB: 1945-12-19, 67 y.o.   MRN: [id number]  HPI Dyspnea: Chronically short of breath. Continues to smoke. Chronic cough.  Tobacco abuse: Continues to smoke, despite pulmonary symptoms.  Chronic cough: Related to smoking and chronic bronchial irritation  Other and unspecified hyperlipidemia: Has been using atorvastatin. She is not sure whether this is part of the reason that she feels so fatigued.  Unspecified hypothyroidism : Followup needed for recheck. It has been over a year since TSH was done.  Other malaise and fatigue: Exhausted" all the time". Says she is sleeping well. No other physical complaints. Denies headaches. No fever, night sweats, or palpitations.  Review of Systems  Constitutional: Positive for fatigue. Negative for fever, chills, diaphoresis, activity change, appetite change and unexpected weight change.  HENT: Negative.   Eyes: Negative.   Respiratory: Positive for cough and shortness of breath. Negative for wheezing.   Cardiovascular: Negative for chest pain, palpitations and leg swelling.  Gastrointestinal: Negative for nausea, vomiting, abdominal pain and abdominal distention.  Endocrine:       History hypothyroidism  Genitourinary: Negative.   Musculoskeletal: Negative for myalgias, back pain, arthralgias and gait problem.  Skin: Negative.   Neurological: Negative for dizziness, tremors, syncope, speech difficulty, weakness, light-headedness and headaches.  Psychiatric/Behavioral:       Feeling depressed sometimes.       Objective:   Physical Exam  Constitutional: She is oriented to person, place, and time. She appears well-developed and well-nourished. No distress.  HENT:  Head: Normocephalic and atraumatic.  Right Ear: External ear normal.  Left Ear: External ear normal.  Nose: Nose normal.  Eyes: Conjunctivae and EOM are normal. Pupils are equal, round, and reactive to light. Left eye exhibits  no discharge.  Corrective lenses.  Neck: Normal range of motion. Neck supple. No JVD present. No tracheal deviation present. No thyromegaly present.  Cardiovascular: Normal rate, regular rhythm, normal heart sounds and intact distal pulses.  Exam reveals no gallop and no friction rub.   No murmur heard. Pulmonary/Chest: Effort normal and breath sounds normal. No respiratory distress. She has no wheezes. She has no rales.  Abdominal: Soft. Bowel sounds are normal. She exhibits no distension and no mass. There is no tenderness.  Musculoskeletal: Normal range of motion. She exhibits no edema and no tenderness.  Lymphadenopathy:    She has no cervical adenopathy.  Neurological: She is alert and oriented to person, place, and time. No cranial nerve deficit. Coordination normal.  Skin: Skin is warm and dry. No rash noted. She is not diaphoretic. No erythema. No pallor.  Psychiatric: She has a normal mood and affect. Her behavior is normal. Thought content normal.          Assessment & Plan:  1. Dyspnea Chronic lung disease. Aggravated by persistent smoking.  2. Tobacco abuse Encourage to stop smoking.  3. Chronic cough As result of chronic bronchial irritation and smoking.  4. Other and unspecified hyperlipidemia Recheck lipid panel  5. Unspecified hypothyroidism Recheck TSH - TSH - Lipid panel - CBC with Differential; Future - CBC with Differential  6. Other malaise and fatigue Recheck TSH. Stop atorvastatin. Return in 2 months with repeat lipid panel. - CMP - CBC with Differential

## 2012-09-07 ENCOUNTER — Other Ambulatory Visit: Payer: Self-pay | Admitting: Internal Medicine

## 2012-09-09 ENCOUNTER — Ambulatory Visit: Payer: Medicare Other

## 2012-10-07 ENCOUNTER — Encounter: Payer: Self-pay | Admitting: Internal Medicine

## 2012-10-22 ENCOUNTER — Ambulatory Visit (INDEPENDENT_AMBULATORY_CARE_PROVIDER_SITE_OTHER): Payer: 59 | Admitting: Internal Medicine

## 2012-10-22 ENCOUNTER — Encounter: Payer: Self-pay | Admitting: Internal Medicine

## 2012-10-22 VITALS — BP 110/62 | HR 85 | Temp 97.4°F | Resp 16 | Ht 65.0 in | Wt 157.6 lb

## 2012-10-22 DIAGNOSIS — Z720 Tobacco use: Secondary | ICD-10-CM

## 2012-10-22 DIAGNOSIS — M79645 Pain in left finger(s): Secondary | ICD-10-CM

## 2012-10-22 DIAGNOSIS — R5383 Other fatigue: Secondary | ICD-10-CM

## 2012-10-22 DIAGNOSIS — R5381 Other malaise: Secondary | ICD-10-CM

## 2012-10-22 DIAGNOSIS — E039 Hypothyroidism, unspecified: Secondary | ICD-10-CM

## 2012-10-22 DIAGNOSIS — F17200 Nicotine dependence, unspecified, uncomplicated: Secondary | ICD-10-CM

## 2012-10-22 DIAGNOSIS — R002 Palpitations: Secondary | ICD-10-CM | POA: Insufficient documentation

## 2012-10-22 DIAGNOSIS — M79609 Pain in unspecified limb: Secondary | ICD-10-CM

## 2012-10-22 DIAGNOSIS — E785 Hyperlipidemia, unspecified: Secondary | ICD-10-CM

## 2012-10-22 DIAGNOSIS — R05 Cough: Secondary | ICD-10-CM

## 2012-10-22 DIAGNOSIS — R0989 Other specified symptoms and signs involving the circulatory and respiratory systems: Secondary | ICD-10-CM

## 2012-10-22 DIAGNOSIS — R0600 Dyspnea, unspecified: Secondary | ICD-10-CM

## 2012-10-22 MED ORDER — ATORVASTATIN CALCIUM 40 MG PO TABS: ORAL_TABLET | ORAL | Status: DC

## 2012-10-22 MED ORDER — LEVOTHYROXINE SODIUM 25 MCG PO TABS: ORAL_TABLET | ORAL | Status: DC

## 2012-10-22 NOTE — Progress Notes (Signed)
Subjective:    Patient ID: Gabrielle White, female    DOB: 1945/06/29, 67 y.o.   MRN: [id number]  HPI  Dyspnea: Chronically short of breath. Felt worse a few weeks ago. Worried about O2 Sat. Today it is fine.  Tobacco abuse: Continues to smoke, despite pulmonary symptoms. Continues to smoke. Chronic cough.  Chronic cough: Persistent related to smoking and chronic bronchial irritation  Other and unspecified hyperlipidemia: Stopped atorvastatin last visit. She was not sure whether this is part of the reason that she feels so fatigued. Has not felt any better off it. She does not want to go back on it.  Unspecified hypothyroidism : slightly low TSH in may 2014.  Other malaise and fatigue: Exhausted" all the time". Says she is sleeping well. No other physical complaints. Denies headaches. No fever, night sweats, or palpitations.  Horse stepped on left foot a few years ago. Has an altered sensation in 3-4-5th toes.  Pain in the left 5th finger is better.  Does not have chest pain, but she has awareness of "heavy" heart beats.  Current Outpatient Prescriptions on File Prior to Visit  Medication Sig Dispense Refill  . albuterol (PROVENTIL,VENTOLIN) 90 MCG/ACT inhaler Inhale 2 puffs into the lungs every 4 (four) hours as needed.        . ALPRAZolam (XANAX) 0.5 MG tablet Take 0.5 mg by mouth at bedtime as needed.        . budesonide-formoterol (SYMBICORT) 80-4.5 MCG/ACT inhaler Inhale 2 puffs into the lungs 2 (two) times daily.        . Cholecalciferol (VITAMIN D3) 1000 UNITS CAPS Take 1 capsule by mouth daily.        . hydrochlorothiazide 25 MG tablet Take 25 mg by mouth daily.        Marland Kitchen losartan (COZAAR) 50 MG tablet Take one tablet once daily      . sertraline (ZOLOFT) 100 MG tablet Take 100 mg by mouth daily.        . VENTOLIN HFA 108 (90 BASE) MCG/ACT inhaler INHALE TWO PUFFS EVERY 4 HOURS AS NEEDED FOR COUGH OR SHORTNESS OF BREATH.  18 each  0        Review of Systems   Constitutional: Positive for fatigue. Negative for fever, chills, diaphoresis, activity change, appetite change and unexpected weight change.  HENT: Negative.   Eyes: Negative.   Respiratory: Positive for cough and shortness of breath. Negative for wheezing.   Cardiovascular: Negative for chest pain, palpitations and leg swelling.  Gastrointestinal: Negative for nausea, vomiting, abdominal pain and abdominal distention.  Endocrine:       History hypothyroidism  Genitourinary: Negative.   Musculoskeletal: Negative for myalgias, back pain, arthralgias and gait problem.  Skin: Negative.   Neurological: Negative for dizziness, tremors, syncope, speech difficulty, weakness, light-headedness and headaches.  Psychiatric/Behavioral:       Feeling depressed sometimes.       Objective:BP 110/62  Pulse 85  Temp(Src) 97.4 F (36.3 C) (Oral)  Resp 16  Ht 5\' 5"  (1.651 m)  Wt 157 lb 9.6 oz (71.487 kg)  BMI 26.23 kg/m2  SpO2 99%    Physical Exam  Constitutional: She is oriented to person, place, and time. She appears well-developed and well-nourished. No distress.  HENT:  Head: Normocephalic and atraumatic.  Right Ear: External ear normal.  Left Ear: External ear normal.  Nose: Nose normal.  Eyes: Conjunctivae and EOM are normal. Pupils are equal, round, and reactive to light. Left eye exhibits no  discharge.  Corrective lenses.  Neck: Normal range of motion. Neck supple. No JVD present. No tracheal deviation present. No thyromegaly present.  Cardiovascular: Normal rate, regular rhythm, normal heart sounds and intact distal pulses.  Exam reveals no gallop and no friction rub.   No murmur heard. Pulmonary/Chest: Effort normal and breath sounds normal. No respiratory distress. She has no wheezes. She has no rales.  Abdominal: Soft. Bowel sounds are normal. She exhibits no distension and no mass. There is no tenderness.  Musculoskeletal: Normal range of motion. She exhibits no edema and no  tenderness.  Lymphadenopathy:    She has no cervical adenopathy.  Neurological: She is alert and oriented to person, place, and time. No cranial nerve deficit. Coordination normal.  Skin: Skin is warm and dry. No rash noted. She is not diaphoretic. No erythema. No pallor.  Psychiatric: She has a normal mood and affect. Her behavior is normal. Thought content normal.     Office Visit on 08/20/2012  Component Date Value Range Status  . Glucose 08/20/2012 94  65 - 99 mg/dL Final  . BUN 82/95/6213 14  8 - 27 mg/dL Final  . Creatinine, Ser 08/20/2012 0.76  0.57 - 1.00 mg/dL Final  . GFR calc non Af Amer 08/20/2012 81  >59 mL/min/1.73 Final  . GFR calc Af Amer 08/20/2012 94  >59 mL/min/1.73 Final  . BUN/Creatinine Ratio 08/20/2012 18  11 - 26 Final  . Sodium 08/20/2012 142  134 - 144 mmol/L Final  . Potassium 08/20/2012 4.7  3.5 - 5.2 mmol/L Final  . Chloride 08/20/2012 102  97 - 108 mmol/L Final  . CO2 08/20/2012 26  19 - 28 mmol/L Final  . Calcium 08/20/2012 10.2  8.6 - 10.2 mg/dL Final  . Total Protein 08/20/2012 6.6  6.0 - 8.5 g/dL Final  . Albumin 08/65/7846 4.4  3.6 - 4.8 g/dL Final  . Globulin, Total 08/20/2012 2.2  1.5 - 4.5 g/dL Final  . Albumin/Globulin Ratio 08/20/2012 2.0  1.1 - 2.5 Final  . Total Bilirubin 08/20/2012 0.2  0.0 - 1.2 mg/dL Final  . Alkaline Phosphatase 08/20/2012 109  39 - 117 IU/L Final  . AST 08/20/2012 13  0 - 40 IU/L Final  . ALT 08/20/2012 13  0 - 32 IU/L Final  . WBC 08/20/2012 CANCELED   Final-Edited   Comment: Please refer to the following specimen for additional lab results.                          see [phone]                                                    Result canceled by the ancillary  . nRBC 08/20/2012 CANCELED   Final-Edited   Comment: Please refer to the following specimen for additional lab results.                          see [phone]  Result canceled by the ancillary   . TSH 08/20/2012 0.427* 0.450 - 4.500 uIU/mL Final  . Cholesterol, Total 08/20/2012 153  100 - 199 mg/dL Final  . Triglycerides 08/20/2012 211* 0 - 149 mg/dL Final  . HDL 16/01/9603 43  >39 mg/dL Final   Comment: According to ATP-III Guidelines, HDL-C >59 mg/dL is considered a                          negative risk factor for CHD.  Marland Kitchen VLDL Cholesterol Cal 08/20/2012 42* 5 - 40 mg/dL Final  . LDL Calculated 08/20/2012 68  0 - 99 mg/dL Final  . Chol/HDL Ratio 08/20/2012 3.6  0.0 - 4.4 ratio units Final  . WBC 08/20/2012 10.8  3.4 - 10.8 x10E3/uL Final  . RBC 08/20/2012 4.75  3.77 - 5.28 x10E6/uL Final  . Hemoglobin 08/20/2012 14.8  11.1 - 15.9 g/dL Final  . HCT 54/12/8117 43.3  34.0 - 46.6 % Final  . MCV 08/20/2012 91  79 - 97 fL Final  . MCH 08/20/2012 31.2  26.6 - 33.0 pg Final  . MCHC 08/20/2012 34.2  31.5 - 35.7 g/dL Final  . RDW 14/78/2956 13.1  12.3 - 15.4 % Final  . Neutrophils Relative % 08/20/2012 46  40 - 74 % Final  . Lymphs 08/20/2012 41  14 - 46 % Final  . Monocytes 08/20/2012 7  4 - 12 % Final  . Eos 08/20/2012 5  0 - 5 % Final  . Basos 08/20/2012 1  0 - 3 % Final  . Neutrophils Absolute 08/20/2012 5.0  1.4 - 7.0 x10E3/uL Final  . Lymphocytes Absolute 08/20/2012 4.4* 0.7 - 3.1 x10E3/uL Final  . Monocytes Absolute 08/20/2012 0.7  0.1 - 0.9 x10E3/uL Final  . Eosinophils Absolute 08/20/2012 0.5* 0.0 - 0.4 x10E3/uL Final  . Basophils Absolute 08/20/2012 0.1  0.0 - 0.2 x10E3/uL Final  . Immature Granulocytes 08/20/2012 0  0 - 2 % Final  . Immature Grans (Abs) 08/20/2012 0.0  0.0 - 0.1 x10E3/uL Final  . specimen status report 08/20/2012    Preliminary   Comment: Ambiguous Test Order                          Ambiguous Test Order         Assessment & Plan:  1. Dyspnea Chronic lung disease. Aggravated by persistent smoking.  2. Tobacco abuse Encourage to stop smoking.  3. Chronic cough As result of chronic bronchial irritation and smoking.  4. Other and unspecified  hyperlipidemia Resume atorvastatin. Return in 3 months with repeat lipid panel.  5. Unspecified hypothyroidism Recheck TSH prior to next visit  6. Other malaise and fatigue: persistent. Undetermined etiology.  7. Palpitations: EKG today

## 2012-10-22 NOTE — Patient Instructions (Signed)
Get Zostavax at you pharmacy and let me know when you have had the injection.

## 2012-10-27 ENCOUNTER — Other Ambulatory Visit: Payer: Self-pay | Admitting: Internal Medicine

## 2012-10-29 ENCOUNTER — Encounter: Payer: Self-pay | Admitting: Internal Medicine

## 2012-10-29 ENCOUNTER — Other Ambulatory Visit: Payer: Self-pay | Admitting: Geriatric Medicine

## 2012-10-29 ENCOUNTER — Other Ambulatory Visit: Payer: Self-pay | Admitting: Internal Medicine

## 2012-10-29 MED ORDER — ALPRAZOLAM 0.5 MG PO TABS: 0.5000 mg | ORAL_TABLET | Freq: Every evening | ORAL | Status: DC | PRN

## 2012-11-04 ENCOUNTER — Encounter: Payer: Self-pay | Admitting: Internal Medicine

## 2012-11-18 ENCOUNTER — Encounter: Payer: Self-pay | Admitting: Nurse Practitioner

## 2012-11-18 ENCOUNTER — Ambulatory Visit (INDEPENDENT_AMBULATORY_CARE_PROVIDER_SITE_OTHER): Payer: 59 | Admitting: Nurse Practitioner

## 2012-11-18 VITALS — BP 126/80 | HR 76 | Temp 98.7°F | Resp 18 | Wt 159.4 lb

## 2012-11-18 DIAGNOSIS — J449 Chronic obstructive pulmonary disease, unspecified: Secondary | ICD-10-CM | POA: Insufficient documentation

## 2012-11-18 DIAGNOSIS — J4489 Other specified chronic obstructive pulmonary disease: Secondary | ICD-10-CM

## 2012-11-18 MED ORDER — BUDESONIDE-FORMOTEROL FUMARATE 80-4.5 MCG/ACT IN AERO: 2.0000 | INHALATION_SPRAY | Freq: Two times a day (BID) | RESPIRATORY_TRACT | Status: DC

## 2012-11-18 NOTE — Progress Notes (Signed)
Patient ID: Gabrielle White, female   DOB: 1945-07-16, 67 y.o.   MRN: [id number]   Allergies  Allergen Reactions  . Cafergot   . Codeine   . Fenoprofen Calcium   . Nalfon [Fenoprofen]     Chief Complaint  Patient presents with  . Acute Visit    trouble breathing    HPI: Patient is a 67 y.o. female seen in the office today for cough and shortness of breath after taking albuterol 2 days ago. Overall shortness of breath is better but was concerned due to the coughing after taking the medication.  Pt was previously not on any rescue inhaler until 1 month ago also does not take symbicort. pt reports she is a current smoker; took albuterol 2 days ago twice for increase in shortness of breath after being outside and after her second dose she noted a worsening cough. No fevers or chills. No worsening cough or congestion, no worsening cough since. Reports aggravating factors includes the 4 dogs in her house and heat outside, pt also has horses. Pt reports breathing has improved each day and she currently is at baseline. Review of Systems:  Review of Systems  Constitutional: Negative for fever, chills and malaise/fatigue.  Respiratory: Positive for cough and shortness of breath. Negative for sputum production and wheezing.        Pt with chronic cough and shortness of breath. As of today this is at baseline per pt.   Cardiovascular: Negative for chest pain and palpitations.  Neurological: Negative for weakness and headaches.     Past Medical History  Diagnosis Date  . Emphysema   . Hyperlipidemia   . Leukocytosis   . Tobacco abuse   . Depressive disorder   . HTN (hypertension)   . Nonspecific (abnormal) findings on radiological and other examination of skull and head   . Enthesopathy of ankle and tarsus, unspecified   . Hypopotassemia   . Other specified disease of white blood cells   . Chronic airway obstruction, not elsewhere classified   . Palpitations   . Other emphysema   .  Osteoarthrosis, unspecified whether generalized or localized, unspecified site   . Depressive disorder, not elsewhere classified   . Migraine, unspecified, without mention of intractable migraine without mention of status migrainosus    Past Surgical History  Procedure Laterality Date  . Cesarean section    . Colonoscopy  08/14/2008    Dr.. Lina Sar   Social History:   reports that she has been smoking Cigarettes.  She has a 28 pack-year smoking history. She does not have any smokeless tobacco history on file. She reports that  drinks alcohol. She reports that she does not use illicit drugs.  Family History  Problem Relation Age of Onset  . Alzheimer's disease Father   . Heart disease Father   . Diabetes Father   . Skin cancer Father   . Heart disease Mother   . Breast cancer Mother     Medications: Patient's Medications  New Prescriptions   No medications on file  Previous Medications   ALBUTEROL (PROVENTIL,VENTOLIN) 90 MCG/ACT INHALER    Inhale 2 puffs into the lungs every 4 (four) hours as needed.     ALPRAZOLAM (XANAX) 0.5 MG TABLET    TAKE ONE TABLET BY MOUTH EVERY NIGHT AT BEDTIME AS NEEDED FOR SLEEP   ATORVASTATIN (LIPITOR) 40 MG TABLET    One daily to lower cholesterol   BUDESONIDE-FORMOTEROL (SYMBICORT) 80-4.5 MCG/ACT INHALER    Inhale  2 puffs into the lungs 2 (two) times daily.     CHOLECALCIFEROL (VITAMIN D3) 1000 UNITS CAPS    Take 1 capsule by mouth daily.     HYDROCHLOROTHIAZIDE 25 MG TABLET    Take 25 mg by mouth daily.     LEVOTHYROXINE (LEVOTHROID) 25 MCG TABLET    One daily for thyroid supplement   LOSARTAN (COZAAR) 50 MG TABLET    Take one tablet once daily   SERTRALINE (ZOLOFT) 100 MG TABLET    Take 100 mg by mouth daily.     VENTOLIN HFA 108 (90 BASE) MCG/ACT INHALER    INHALE TWO PUFFS EVERY 4 HOURS AS NEEDED FOR COUGH OR SHORTNESS OF BREATH.  Modified Medications   No medications on file  Discontinued Medications   No medications on file      Physical Exam:  Filed Vitals:   11/18/12 1556  BP: 126/80  Pulse: 76  Temp: 98.7 F (37.1 C)  TempSrc: Oral  Resp: 18  Weight: 159 lb 6.4 oz (72.303 kg)  SpO2: 95%    Physical Exam  Constitutional: She is oriented to person, place, and time and well-developed, well-nourished, and in no distress. No distress.  Cardiovascular: Normal rate, regular rhythm and normal heart sounds.   Pulmonary/Chest: Effort normal. No respiratory distress. She has wheezes (throughout).  Abdominal: Soft. Bowel sounds are normal. She exhibits no distension.  Musculoskeletal: She exhibits no edema and no tenderness.  Neurological: She is alert and oriented to person, place, and time.  Skin: Skin is warm and dry. She is not diaphoretic.  Psychiatric: Affect normal.     Labs reviewed: Basic Metabolic Panel:  Recent Labs  16/10/96 1649  NA 142  K 4.7  CL 102  CO2 26  GLUCOSE 94  BUN 14  CREATININE 0.76  CALCIUM 10.2  TSH 0.427*   Liver Function Tests:  Recent Labs  08/20/12 1649  AST 13  ALT 13  ALKPHOS 109  BILITOT 0.2  PROT 6.6   No results found for this basename: LIPASE, AMYLASE,  in the last 8760 hours No results found for this basename: AMMONIA,  in the last 8760 hours CBC:  Recent Labs  08/20/12 1649 08/20/12 1702  WBC CANCELED 10.8  NEUTROABS  --  5.0  HGB  --  14.8  HCT  --  43.3  MCV  --  91   Lipid Panel:  Recent Labs  08/20/12 1649  HDL 43  LDLCALC 68  TRIG 211*  CHOLHDL 3.6     Assessment/Plan  1. Chronic obstructive asthma, unspecified Pt appears stable at this time and is reluctant to try anything new at this time. Does not feel she needs any additional therapy or treatment.  Encouraged pt to take medication as prescribed- will refill symbicort for her to take twice daily Encouraged her to follow up with pulmonary and quit smoking, avoid aggravating factors  Can take mucinex DM 1 tablet 1 12 has needed for cough and congestion with  increase water intake To use albuterol as needed  To follow up if symptoms get worse.

## 2012-11-18 NOTE — Patient Instructions (Addendum)
To take symbicort 2 puffs twice daily Avoid irritant and exposures that make breathing worse QUIT smoking Follow up with pulmonary  May take mucinex DM 1 tablet twice daily with full glass of water for cough

## 2012-12-03 ENCOUNTER — Ambulatory Visit
Admission: RE | Admit: 2012-12-03 | Discharge: 2012-12-03 | Disposition: A | Discharge status: Home / Self Care | Payer: Medicare HMO | Source: Ambulatory Visit

## 2012-12-03 DIAGNOSIS — Z1231 Encounter for screening mammogram for malignant neoplasm of breast: Secondary | ICD-10-CM

## 2012-12-16 ENCOUNTER — Other Ambulatory Visit: Payer: Self-pay | Admitting: *Deleted

## 2012-12-16 MED ORDER — SERTRALINE HCL 100 MG PO TABS: 100.0000 mg | ORAL_TABLET | Freq: Every day | ORAL | Status: DC

## 2012-12-20 ENCOUNTER — Encounter: Payer: Self-pay | Admitting: Internal Medicine

## 2012-12-20 ENCOUNTER — Ambulatory Visit (INDEPENDENT_AMBULATORY_CARE_PROVIDER_SITE_OTHER): Payer: 59 | Admitting: Internal Medicine

## 2012-12-20 ENCOUNTER — Ambulatory Visit
Admission: RE | Admit: 2012-12-20 | Discharge: 2012-12-20 | Disposition: A | Discharge status: Home / Self Care | Payer: Medicare PPO | Source: Ambulatory Visit | Attending: Internal Medicine | Admitting: Internal Medicine

## 2012-12-20 VITALS — BP 138/80 | HR 64 | Temp 97.3°F | Wt 157.6 lb

## 2012-12-20 DIAGNOSIS — J209 Acute bronchitis, unspecified: Secondary | ICD-10-CM

## 2012-12-20 DIAGNOSIS — Z716 Tobacco abuse counseling: Secondary | ICD-10-CM

## 2012-12-20 DIAGNOSIS — J438 Other emphysema: Secondary | ICD-10-CM

## 2012-12-20 DIAGNOSIS — E785 Hyperlipidemia, unspecified: Secondary | ICD-10-CM

## 2012-12-20 DIAGNOSIS — J439 Emphysema, unspecified: Secondary | ICD-10-CM

## 2012-12-20 DIAGNOSIS — I10 Essential (primary) hypertension: Secondary | ICD-10-CM

## 2012-12-20 DIAGNOSIS — Z7189 Other specified counseling: Secondary | ICD-10-CM

## 2012-12-20 MED ORDER — LOSARTAN POTASSIUM 50 MG PO TABS: ORAL_TABLET | ORAL | Status: DC

## 2012-12-20 MED ORDER — ALBUTEROL SULFATE HFA 108 (90 BASE) MCG/ACT IN AERS: INHALATION_SPRAY | RESPIRATORY_TRACT | Status: DC

## 2012-12-20 MED ORDER — ATORVASTATIN CALCIUM 40 MG PO TABS: ORAL_TABLET | ORAL | Status: DC

## 2012-12-20 NOTE — Progress Notes (Signed)
Patient ID: Gabrielle [name], female   DOB: [birth year], 67 y.o.   MRN: [id number] Location:  [facility]   Allergies  Allergen Reactions  . Cafergot   . Codeine   . Fenoprofen Calcium   . Nalfon [Fenoprofen]     Chief Complaint  Patient presents with  . Cough     Ongoing concern since last OV 11/18/2012. Patient coughed so much she is having pain in rib area   . Medication Refill    renew Atorvastatin, albuterol inhaler, and losartan     HPI: Patient is a 67 y.o. female seen in the office today for coughing for a month.   Has cut down on smoking--does want to quit.  Having periods of coughing for 20 mins in the middle of the night.  Wonders if she has allergies b/c she has dogs, dust, and maybe mold.   Nose gets stuffed up and runny.   Substitute teaches.  Coughs there too and does not cough as much when outside.   Dogs had been outside before--never all 4 in house at once at a time.  Some have long and one with short hair.  Tries to keep the place vacuumed up well.  Also has cat, horses, pig.   Has allergen thing she sprays.  Has two air purifiers.   Has chronic tinnitus.  A little bit of sinus pressure last week and one day had swelling beneath eyes.  Will sometimes hurt on right cheek.  Never previously bothered by sinuses.   Gabrielle [name] prescribed symbicort but [facility] didn't get it for some reason.  Wants scripts sent to [facility] now 90 days.  Review of Systems:  Review of Systems  Constitutional: Positive for malaise/fatigue. Negative for fever.  HENT: Positive for sore throat and tinnitus. Negative for ear pain.   Eyes: Negative for blurred vision.  Respiratory: Positive for cough, sputum production, shortness of breath and wheezing. Negative for hemoptysis.   Cardiovascular: Negative for chest pain.  Gastrointestinal: Negative for abdominal pain.  Genitourinary: Negative for dysuria.  Musculoskeletal: Negative for myalgias.   Skin: Negative for rash.  Neurological: Positive for headaches. Negative for dizziness.  Psychiatric/Behavioral: Negative for depression.     Past Medical History  Diagnosis Date  . Emphysema   . Hyperlipidemia   . Leukocytosis   . Tobacco abuse   . Depressive disorder   . HTN (hypertension)   . Nonspecific (abnormal) findings on radiological and other examination of skull and head   . Enthesopathy of ankle and tarsus, unspecified   . Hypopotassemia   . Other specified disease of white blood cells   . Chronic airway obstruction, not elsewhere classified   . Palpitations   . Other emphysema   . Osteoarthrosis, unspecified whether generalized or localized, unspecified site   . Depressive disorder, not elsewhere classified   . Migraine, unspecified, without mention of intractable migraine without mention of status migrainosus     Past Surgical History  Procedure Laterality Date  . Cesarean section    . Colonoscopy  08/14/2008    Dr.. Lina Sar    Social History:   reports that she has been smoking Cigarettes.  She has a 28 pack-year smoking history. She does not have any smokeless tobacco history on file. She reports that  drinks alcohol. She reports that she does not use illicit drugs.  Family History  Problem Relation Age of Onset  . Alzheimer's disease Father   . Heart  disease Father   . Diabetes Father   . Skin cancer Father   . Heart disease Mother   . Breast cancer Mother     Medications: Patient's Medications  New Prescriptions   No medications on file  Previous Medications   ALPRAZOLAM (XANAX) 0.5 MG TABLET    TAKE ONE TABLET BY MOUTH EVERY NIGHT AT BEDTIME AS NEEDED FOR SLEEP   ATORVASTATIN (LIPITOR) 40 MG TABLET    One daily to lower cholesterol   CHOLECALCIFEROL (VITAMIN D3) 1000 UNITS CAPS    Take 1 capsule by mouth daily.     LEVOTHYROXINE (LEVOTHROID) 25 MCG TABLET    One daily for thyroid supplement   LOSARTAN (COZAAR) 50 MG TABLET    Take one  tablet once daily   SERTRALINE (ZOLOFT) 100 MG TABLET    Take 1 tablet (100 mg total) by mouth daily.   VENTOLIN HFA 108 (90 BASE) MCG/ACT INHALER    INHALE TWO PUFFS EVERY 4 HOURS AS NEEDED FOR COUGH OR SHORTNESS OF BREATH.  Modified Medications   No medications on file  Discontinued Medications   BUDESONIDE-FORMOTEROL (SYMBICORT) 80-4.5 MCG/ACT INHALER    Inhale 2 puffs into the lungs 2 (two) times daily.   HYDROCHLOROTHIAZIDE 25 MG TABLET    Take 25 mg by mouth daily.       Physical Exam: Filed Vitals:   12/20/12 1055  BP: 138/80  Pulse: 64  Temp: 97.3 F (36.3 C)  TempSrc: Oral  Weight: 157 lb 9.6 oz (71.487 kg)  SpO2: 97%  Physical Exam  Constitutional: She is oriented to person, place, and time. She appears well-developed and well-nourished. No distress.  [name] female  HENT:  Head: Normocephalic and atraumatic.  Cardiovascular: Normal rate, regular rhythm, normal heart sounds and intact distal pulses.   Pulmonary/Chest: Effort normal. She has no wheezes. She has no rales.  Coarse wet rhonchi throughout bilateral lungs on anterior and posterior exams  Abdominal: Soft. Bowel sounds are normal. She exhibits no distension. There is no tenderness.  Musculoskeletal: Normal range of motion.  Neurological: She is alert and oriented to person, place, and time.  Skin: Skin is warm and dry. There is pallor.    Labs reviewed: Basic Metabolic Panel:  Recent Labs  21/30/86 1649  NA 142  K 4.7  CL 102  CO2 26  GLUCOSE 94  BUN 14  CREATININE 0.76  CALCIUM 10.2  TSH 0.427*   Liver Function Tests:  Recent Labs  08/20/12 1649  AST 13  ALT 13  ALKPHOS 109  BILITOT 0.2  PROT 6.6  CBC:  Recent Labs  08/20/12 1649 08/20/12 1702  WBC CANCELED 10.8  NEUTROABS  --  5.0  HGB  --  14.8  HCT  --  43.3  MCV  --  91   Lipid Panel:  Recent Labs  08/20/12 1649  HDL 43  LDLCALC 68  TRIG 211*  CHOLHDL 3.6    Assessment/Plan 1. Other and unspecified  hyperlipidemia - continued current therapy with lipitor - atorvastatin (LIPITOR) 40 MG tablet; One daily to lower cholesterol  Dispense: 90 tablet; Refill: 1  2. Unspecified essential hypertension -at goal with curent bp meds--wants prescriptions sent to [facility]--needs to call us back with pharmacy name so they can be faxed - losartan (COZAAR) 50 MG tablet; Take one tablet once daily  Dispense: 90 tablet; Refill: 1  3. Emphysema -seems she is having a subacute exacerbation with current symptoms - albuterol (VENTOLIN HFA) 108 (90 BASE) MCG/ACT  inhaler; INHALE TWO PUFFS EVERY 4 HOURS AS NEEDED FOR COUGH OR SHORTNESS OF BREATH.  Dispense: 18 each; Refill: 3 - DG Chest 2 View; Future to check for acute  Bronchitic changes  4. Acute bronchitis - DG Chest 2 View; Future -will need steroids, possibly abx depending on CXR results  5. Encounter for smoking cessation counseling -pt is interested in quitting again--did in the past when she was pregnant, but picked the habit up again -will call the smoking cessation support group number that was provided today  Labs/tests ordered:  CXR Next appt: as scheduled with Dr. Chilton Si

## 2012-12-20 NOTE — Progress Notes (Signed)
Patient ID: NAKEMA FAKE, female   DOB: [birth year], 67 y.o.   MRN: [id number] Chest xray results returned without any acute changes.  Shows chronic bronchitis only.  I recommend a prednisone taper to help with the inflammation that seems to be worse in her bronchi at this time based on her exam.  Also, drink plenty of water.  Avoid smoking.  If she is not getting better, may want to consider seeing an allergy specialist (with new changes of 4 dogs inside at once).

## 2012-12-24 ENCOUNTER — Telehealth: Payer: Self-pay

## 2012-12-24 NOTE — Telephone Encounter (Signed)
Spoke with patient, discussed results below.Patient verbalized understanding,  RX called into [facility] for Prednisone Taper Pak   Chest xray results returned without any acute changes. Shows chronic bronchitis only. I recommend a prednisone taper to help with the inflammation that seems to be worse in her bronchi at this time based on her exam. Also, drink plenty of water. Avoid smoking. If she is not getting better, may want to consider seeing an allergy specialist (with new changes of 4 dogs inside at once).

## 2012-12-26 ENCOUNTER — Telehealth: Payer: Self-pay

## 2012-12-26 NOTE — Telephone Encounter (Signed)
Left message on VM for patient to return call- reason for call-? Name of mail order company to send rx's, patient was here on the 19th and indicated [name] has a new mail order company but was unsure of the name of company.

## 2013-01-01 NOTE — Telephone Encounter (Signed)
Patient returned call- left message on triage voicemail: Patients mail order company is [employer]

## 2013-01-06 ENCOUNTER — Telehealth: Payer: Self-pay | Admitting: *Deleted
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
## 2013-01-07 NOTE — Telephone Encounter (Signed)
LMOM to return call.

## 2013-01-08 ENCOUNTER — Ambulatory Visit (INDEPENDENT_AMBULATORY_CARE_PROVIDER_SITE_OTHER): Payer: 59 | Admitting: Nurse Practitioner

## 2013-01-08 ENCOUNTER — Encounter: Payer: Self-pay | Admitting: Nurse Practitioner

## 2013-01-08 VITALS — BP 110/70 | HR 74 | Temp 97.7°F | Wt 155.2 lb

## 2013-01-08 DIAGNOSIS — J439 Emphysema, unspecified: Secondary | ICD-10-CM

## 2013-01-08 DIAGNOSIS — J438 Other emphysema: Secondary | ICD-10-CM

## 2013-01-08 DIAGNOSIS — F411 Generalized anxiety disorder: Secondary | ICD-10-CM | POA: Insufficient documentation

## 2013-01-08 DIAGNOSIS — Z23 Encounter for immunization: Secondary | ICD-10-CM

## 2013-01-08 DIAGNOSIS — J441 Chronic obstructive pulmonary disease with (acute) exacerbation: Secondary | ICD-10-CM

## 2013-01-08 MED ORDER — ALBUTEROL SULFATE HFA 108 (90 BASE) MCG/ACT IN AERS: INHALATION_SPRAY | RESPIRATORY_TRACT | Status: DC

## 2013-01-08 MED ORDER — PREDNISONE 20 MG PO TABS: ORAL_TABLET | ORAL | Status: DC

## 2013-01-08 MED ORDER — ALPRAZOLAM 0.5 MG PO TABS: ORAL_TABLET | ORAL | Status: DC

## 2013-01-08 MED ORDER — ALBUTEROL SULFATE HFA 108 (90 BASE) MCG/ACT IN AERS
INHALATION_SPRAY | RESPIRATORY_TRACT | Status: DC
Start: 2013-01-08 — End: 2013-01-16

## 2013-01-08 NOTE — Progress Notes (Signed)
Patient ID: Gabrielle White, female   DOB: 1945-12-02, 67 y.o.   MRN: [id number]   Allergies  Allergen Reactions  . Cafergot   . Codeine   . Fenoprofen Calcium   . Nalfon [Fenoprofen]     Chief Complaint  Patient presents with  . Acute Visit    chest congestion, SOB x 2-3 days    HPI: Patient is a 67 y.o. female seen in the office today for cough and congestion, and chest tightness for 2-3 days. Has cut back smoking; now smoking half. Has been taking albuterol twice daily for the past week. Was previously on symbicort but currently not taking.  Review of Systems:  Review of Systems  Constitutional: Negative for fever and chills.  HENT: Positive for congestion. Negative for sore throat.   Respiratory: Positive for cough and shortness of breath (worse at night; albuterol helps for a little while).   Cardiovascular: Positive for claudication. Negative for chest pain and palpitations.  Neurological: Negative for headaches.     Past Medical History  Diagnosis Date  . Emphysema   . Hyperlipidemia   . Leukocytosis   . Tobacco abuse   . Depressive disorder   . HTN (hypertension)   . Nonspecific (abnormal) findings on radiological and other examination of skull and head   . Enthesopathy of ankle and tarsus, unspecified   . Hypopotassemia   . Other specified disease of white blood cells   . Chronic airway obstruction, not elsewhere classified   . Palpitations   . Other emphysema   . Osteoarthrosis, unspecified whether generalized or localized, unspecified site   . Depressive disorder, not elsewhere classified   . Migraine, unspecified, without mention of intractable migraine without mention of status migrainosus    Past Surgical History  Procedure Laterality Date  . Cesarean section    . Colonoscopy  08/14/2008    Dr.. Lina Sar   Social History:   reports that she has been smoking Cigarettes.  She has a 28 pack-year smoking history. She does not have any smokeless tobacco  history on file. She reports that she drinks alcohol. She reports that she does not use illicit drugs.  Family History  Problem Relation Age of Onset  . Alzheimer's disease Father   . Heart disease Father   . Diabetes Father   . Skin cancer Father   . Heart disease Mother   . Breast cancer Mother     Medications: Patient's Medications  New Prescriptions   No medications on file  Previous Medications   ALBUTEROL (VENTOLIN HFA) 108 (90 BASE) MCG/ACT INHALER    INHALE TWO PUFFS EVERY 4 HOURS AS NEEDED FOR COUGH OR SHORTNESS OF BREATH.   ALPRAZOLAM (XANAX) 0.5 MG TABLET    TAKE ONE TABLET BY MOUTH EVERY NIGHT AT BEDTIME AS NEEDED FOR SLEEP   ATORVASTATIN (LIPITOR) 40 MG TABLET    One daily to lower cholesterol   CHOLECALCIFEROL (VITAMIN D3) 1000 UNITS CAPS    Take 1 capsule by mouth daily.     LEVOTHYROXINE (LEVOTHROID) 25 MCG TABLET    One daily for thyroid supplement   LOSARTAN (COZAAR) 50 MG TABLET    Take one tablet once daily   SERTRALINE (ZOLOFT) 100 MG TABLET    Take 1 tablet (100 mg total) by mouth daily.  Modified Medications   No medications on file  Discontinued Medications   No medications on file     Physical Exam:  Filed Vitals:   01/08/13 0821  BP:  110/70  Pulse: 74  Temp: 97.7 F (36.5 C)  TempSrc: Oral  Weight: 155 lb 3.2 oz (70.398 kg)  SpO2: 96%    Physical Exam  Constitutional: She is well-developed, well-nourished, and in no distress. No distress.  HENT:  Head: Normocephalic and atraumatic.  Mouth/Throat: Oropharynx is clear and moist. No oropharyngeal exudate.  Eyes: Conjunctivae and EOM are normal. Pupils are equal, round, and reactive to light.  Neck: Normal range of motion. Neck supple.  Cardiovascular: Normal rate, regular rhythm and normal heart sounds.   Pulmonary/Chest: Effort normal. No respiratory distress. She has wheezes. She has rales.  Wheezing throughout  Abdominal: Soft. Bowel sounds are normal.  Lymphadenopathy:    She has no  cervical adenopathy.  Skin: She is not diaphoretic.    Labs reviewed: Basic Metabolic Panel:  Recent Labs  08/65/78 1649  NA 142  K 4.7  CL 102  CO2 26  GLUCOSE 94  BUN 14  CREATININE 0.76  CALCIUM 10.2  TSH 0.427*   Liver Function Tests:  Recent Labs  08/20/12 1649  AST 13  ALT 13  ALKPHOS 109  BILITOT 0.2  PROT 6.6   No results found for this basename: LIPASE, AMYLASE,  in the last 8760 hours No results found for this basename: AMMONIA,  in the last 8760 hours CBC:  Recent Labs  08/20/12 1649 08/20/12 1702  WBC CANCELED 10.8  NEUTROABS  --  5.0  HGB  --  14.8  HCT  --  43.3  MCV  --  91   Lipid Panel:  Recent Labs  08/20/12 1649  HDL 43  LDLCALC 68  TRIG 211*  CHOLHDL 3.6    Assessment/Plan 1. Emphysema - albuterol (VENTOLIN HFA) 108 (90 BASE) MCG/ACT inhaler; INHALE TWO PUFFS EVERY 4 HOURS AS NEEDED FOR COUGH OR SHORTNESS OF BREATH.  Dispense: 18 each; Refill: 3 - to make follow up appt if still needing albuterol daily after course of steroids   2. Bronchitis, chronic obstructive, with exacerbation Will give prednisone course; and to use albuterol as needed; to seek medical attention if symptoms fail to improve or get worse -mucinex DM 1 tablet twice daily with full glass of water - albuterol (VENTOLIN HFA) 108 (90 BASE) MCG/ACT inhaler; INHALE TWO PUFFS EVERY 4 HOURS AS NEEDED FOR COUGH OR SHORTNESS OF BREATH.  Dispense: 18 each; Refill: 3 - predniSONE (DELTASONE) 20 MG tablet; Take 3 tablets daily for 3 days then 2 tablets daily for 2 days for exacerbation  Dispense: 13 tablet; Refill: 0  3. Anxiety state, unspecified Pt reports pills were stolen; pt filled a police report and this was sent to the office; refill provided at this time.  - ALPRAZolam (XANAX) 0.5 MG tablet; TAKE ONE TABLET BY MOUTH EVERY NIGHT AT BEDTIME AS NEEDED FOR SLEEP  Dispense: 30 tablet; Refill: 0   4. Smoking cessation  Encouraged to keep cutting back on cigarettes  and to quit smoking

## 2013-01-08 NOTE — Patient Instructions (Addendum)
-  mucinex DM 1 tablet twice daily with full glass of water -albuterol 1-2 puffs every 4 hours as needed -course of steroids sent to your pharmacy -if you are requiring your albuterol frequently (daily) after this exacerbation and course of steroids please make another appt  Chronic Obstructive Pulmonary Disease Exacerbation  Chronic obstructive pulmonary disease (COPD) is a lung disease. The lungs become damaged, making it hard to get air in and out of your lungs. COPD exacerbation means that your COPD has gotten worse. If you do not get help, this can be a life-threatening problem. HOME CARE  Do not smoke.  Avoid tobacco smoke and other things that bother your lungs.  If given, take your antibiotic medicine as told. Finish the medicine even if you start to feel better.  Only take medicines as told by your doctor.  Drink enough fluids to keep your pee (urine) clear or pale yellow.  Use a cool mist machine (vaporizer).  If you use oxygen or a machine that turns liquid medicine into a mist (nebulizer), continue to use them as told.  Keep up with shots (vaccinations) as told by your doctor.  Exercise regularly.  Eat healthy foods.  Keep all doctor visits as told. GET HELP RIGHT AWAY IF:  You are very short of breath.  You have trouble talking.  You have bad chest pain.  You have blood in your spit (sputum).  You have a fever, or you keep throwing up (vomiting).  You feel weak, or you pass out (faint).  You feel confused.  You keep getting worse. MAKE SURE YOU:   Understand these instructions.  Will watch your condition.  Will get help right away if you are not doing well or get worse. Document Released: 03/09/2011 Document Revised: 06/12/2011 Document Reviewed: 03/09/2011 [facility] Patient Information 2014 [place], [place].

## 2013-01-15 ENCOUNTER — Telehealth: Payer: Self-pay | Admitting: *Deleted

## 2013-01-15 NOTE — Telephone Encounter (Signed)
I called Shanda Bumps because Patient wants another round of Prednisone called in because she states she cannot breath and wants it  Called in. Shanda Bumps stated that patient needs an appointment to follow up if she is no better after having a round of Prednisone and doing inhalers, also states that patient needs to stop smoking and Mette Southgate need a pulmonologist appointment. I called patient and told her this and  Patient is Screaming at me through the phone that she wants medication and she wants it now and i better call it in. Patient states she cannot breath and needs something done now.Told her if she was having such a hard time breathing she needed to go to the ER or Urgent Care Center and she told me she was not going to do that and that I could forget it. She stated that she had a Dr. And they needed to call in the medication. Told patient she needs a follow up appointment and she starts screaming at me again. She states that if she dies she is going to SUE her pants off. (put patient on hold and got Aram Beecham). Per Aram Beecham she also told patient she needed to go to ER or Urgent Care and patient refused.

## 2013-01-16 ENCOUNTER — Other Ambulatory Visit: Payer: Self-pay | Admitting: *Deleted

## 2013-01-16 ENCOUNTER — Encounter: Payer: Self-pay | Admitting: Internal Medicine

## 2013-01-16 ENCOUNTER — Ambulatory Visit (INDEPENDENT_AMBULATORY_CARE_PROVIDER_SITE_OTHER): Payer: Medicare HMO | Admitting: Internal Medicine

## 2013-01-16 ENCOUNTER — Other Ambulatory Visit: Payer: Self-pay | Admitting: Nurse Practitioner

## 2013-01-16 VITALS — BP 110/66 | HR 85 | Temp 98.2°F | Ht 65.0 in | Wt 156.0 lb

## 2013-01-16 DIAGNOSIS — R0609 Other forms of dyspnea: Secondary | ICD-10-CM

## 2013-01-16 DIAGNOSIS — J449 Chronic obstructive pulmonary disease, unspecified: Secondary | ICD-10-CM

## 2013-01-16 DIAGNOSIS — R0600 Dyspnea, unspecified: Secondary | ICD-10-CM

## 2013-01-16 DIAGNOSIS — F17200 Nicotine dependence, unspecified, uncomplicated: Secondary | ICD-10-CM

## 2013-01-16 MED ORDER — PREDNISONE (PAK) 10 MG PO TABS: ORAL_TABLET | ORAL | Status: DC

## 2013-01-16 MED ORDER — BUDESONIDE-FORMOTEROL FUMARATE 160-4.5 MCG/ACT IN AERO: INHALATION_SPRAY | RESPIRATORY_TRACT | Status: DC

## 2013-01-16 NOTE — Progress Notes (Addendum)
Subjective:    Patient ID: Gabrielle White, female    DOB: 08/11/1945, 67 y.o.   MRN: [id number]  HPI IOV 11/10/2010:  Smoker. Referred by Dr. Allena Katz for possible copd.   Reports insidious onset chronic cough for few years. Slowly progressive. Moderate in severity. Associated thin white sputum + of small amounts present. Associated dogs, cats and horses present but she is not sure that this makes cough worse though she feels that the house she has lived in for past 5 years might be related to cough onset though she does not know what. States house is clean though dog hair present. Cough made worse occ by lying down and cig smoke but not always. Cough improved by a 5 day prednisone course in end June 2012 when she went to ER for acute dyspnea. Of note, Kouffman Reflux Symptom Index Score (RSI) is 11 and therefore against dx of LPR.   Also, reports 1  Year chronic dyspnea of insidious onset. Heat and activity makes dyspnea worse. Walking dogs 1/2 mile is no problem but any heavier activity more than walking makes her more dyspneic. Also, progressive. Rates dyspnea as moderate. Improved by rest  On   09/25/2010 wennt to ER with acute dyspnea after being in horse stall - ddimer, ck, ekg, trop (reviewed) all normal. CXR showed emphysema and was sent home on 5 day prednisone that helped immensely ("oh yeah"). Note, did not desaturate walking 185 feet x 3 laps in office Ongoing tobacco abuse. Quit for 15 years at age 23. 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 Interested in quitting smoking. Has taken chantix in past but does not remember if any problems with it. Denies associated chest pain, gerd, edema, sputum, inhaler intake but noted to have WC 11.5k - 15K past 2 months on labs. Heme cx pending per hx oday  . She continues to smoke. We discussed several options for smoking cesstation w/ pt education . She is maintained on Symbicort and tolerating well with no  increased use of SABA .   REC Most important goal is to quit smoking  Try a lot of the helpful quit smoking suggestions we discussed.  follow up Dr. Marchelle Gearing in 3 months and As needed   OV 02/03/11: Followup smoking, cough, dyspnea.   Still smoking. Unable to quit  Still with cough. Moderate intensity. Increased when she lies down or when she laughs. Does not feel tickle in throat. Associated white-yellow mucus present; small amounts. Occ sinus drainage +. Very rare heartburn +. Overall stable wihtout change   Still dyspneic. Unchanged. Dyspnea with exertion. Relieved by rest. For class 2-3 activities. Did not think symbicort helped when she took it for 1 month and then quit. Not taken symbicort in 2-3 months Also reports 4 dogs and 1 cat in small house indoors   PFTs 11/18/10: FEV1 of 1.91 L (90%), ratio of 73, DLCO  75% No sign change with SABA  #smoking  - try to work on quitting #cough  - unclear why but let us figure out the shortness of breath first #shortness of breath  - unclear why so have CPX bike exercise test with exercise induced challenge for asthma #Followup  - after cpx bike test > did not happen    01/16/2013  Acute  ov/Riordan Walle re: sob Chief Complaint  Patient presents with  . Acute Visit    Pt c/o increased SOB for the past month.  She states that she  gets SOB when at night when she lies down and when she first wakes up in the am.  She has been using her rescue inhaler approx 4 times per day. She also c/o prod cough with moderate white sputum.  saba makes it better x a few hours ,  Much better p prednisone in past, not better on advair.  p saba only sob with more than slow adls, before saba sob at rest   No obvious pattern day to day or daytime variabilty or assoc chronic cough or cp or chest tightness, subjective wheeze overt sinus or hb symptoms. No unusual exp hx or h/o childhood pna/ asthma or knowledge of premature birth.   Also denies any obvious fluctuation  of symptoms with weather or environmental changes or other aggravating or alleviating factors except as outlined above   Current Medications, Allergies, Complete Past Medical History, Past Surgical History, Family History, and Social History were reviewed in [employer] record.  ROS  The following are not active complaints unless bolded sore throat, dysphagia, dental problems, itching, sneezing,  nasal congestion or excess/ purulent secretions, ear ache,   fever, chills, sweats, unintended wt loss, pleuritic or exertional cp, hemoptysis,  orthopnea pnd or leg swelling, presyncope, palpitations, heartburn, abdominal pain, anorexia, nausea, vomiting, diarrhea  or change in bowel or urinary habits, change in stools or urine, dysuria,hematuria,  rash, arthralgias, visual complaints, headache, numbness weakness or ataxia or problems with walking or coordination,  change in mood/affect or memory.           Objective:   Physical Exam GEN: A/Ox3; pleasant , NAD, well nourished  - last used saba 2 h prior to OV   Wt Readings from Last 3 Encounters:  01/16/13 156 lb (70.761 kg)  01/08/13 155 lb 3.2 oz (70.398 kg)  12/20/12 157 lb 9.6 oz (71.487 kg)      HEENT:  [place]/AT,  EACs-clear, TMs-wnl, NOSE-clear, THROAT-clear, no lesions, no postnasal drip or exudate noted.   NECK:  Supple w/ fair ROM; no JVD; normal carotid impulses w/o bruits; no thyromegaly or nodules palpated; no lymphadenopathy.  RESP  Mid exp sonorous rhonchi sym bilaterally  CARD:  RRR, no m/r/g  , no peripheral edema, pulses intact, no cyanosis or clubbing.  GI:   Soft & nt; nml bowel sounds; no organomegaly or masses detected.  Musco: Warm bil, no deformities or joint swelling noted.   Neuro: alert, no focal deficits noted.    Skin: Warm, no lesions or rashes     cxr 12/20/12 No acute cardiopulmonary abnormality seen.        Assessment & Plan:

## 2013-01-16 NOTE — Patient Instructions (Addendum)
Symbicort Take 2 puffs first thing in am and then another 2 puffs about 12 hours later.  Prednisone 10 mg take  4 each am x 2 days,   2 each am x 2 days,  1 each am x 2 days and stop    Only use your albuterol (ventolin) as a rescue medication to be used if you can't catch your breath by resting or doing a relaxed purse lip breathing pattern.  - The less you use it, the better it will work when you need it. - Ok to use up to every 4 hours if you must but call for immediate appointment if use goes up over your usual need - Don't leave home without it !!  (think of it like your spare tire for your car)   Work on inhaler technique:  relax and gently blow all the way out then take a nice smooth deep breath back in, triggering the inhaler at same time you start breathing in.  Hold for up to 5 seconds if you can.  Rinse and gargle with water when done  The key is to stop smoking completely before smoking completely stops you!

## 2013-01-17 ENCOUNTER — Other Ambulatory Visit: Payer: Self-pay | Admitting: *Deleted

## 2013-01-17 ENCOUNTER — Encounter: Payer: Self-pay | Admitting: Internal Medicine

## 2013-01-17 MED ORDER — PREDNISONE (PAK) 10 MG PO TABS: ORAL_TABLET | ORAL | Status: DC

## 2013-01-17 NOTE — Assessment & Plan Note (Addendum)
DDX of  difficult airways managment all start with A and  include Adherence, Ace Inhibitors, Acid Reflux, Active Sinus Disease, Alpha 1 Antitripsin deficiency, Anxiety masquerading as Airways dz,  ABPA,  allergy(esp in young), Aspiration (esp in elderly), Adverse effects of DPI,  Active smokers, plus two Bs  = Bronchiectasis and Beta blocker use..and one C= CHF  She clearly as rhonchi 2 h p saba so is likely asthmatic with nl baseline pfts but difficult to control   Adherence is always the initial "prime suspect" and is a multilayered concern that requires a "trust but verify" approach in every patient - starting with knowing how to use medications, especially inhalers, correctly, keeping up with refills and understanding the fundamental difference between maintenance and prns vs those medications only taken for a very short course and then stopped and not refilled. The proper method of use, as well as anticipated side effects, of a metered-dose inhaler are discussed and demonstrated to the patient. Improved effectiveness after extensive coaching during this visit to a level of approximately  75% but needs more work before we declare her unresponsive to ics/laba.  Active smoking is also at the top of the list > discussed separately

## 2013-01-17 NOTE — Addendum Note (Signed)
Addended by: Caryl Ada on: 01/17/2013 02:41 PM   Modules accepted: Orders

## 2013-01-17 NOTE — Assessment & Plan Note (Signed)

## 2013-02-17 ENCOUNTER — Other Ambulatory Visit: Payer: 59

## 2013-02-17 DIAGNOSIS — E785 Hyperlipidemia, unspecified: Secondary | ICD-10-CM

## 2013-02-17 DIAGNOSIS — E039 Hypothyroidism, unspecified: Secondary | ICD-10-CM

## 2013-02-18 LAB — LIPID PANEL
Chol/HDL Ratio: 3 ratio units (ref 0.0–4.4)
Cholesterol, Total: 184 mg/dL (ref 100–199)
HDL: 61 mg/dL (ref >39)
LDL Calculated: 104 mg/dL — ABNORMAL HIGH (ref 0–99)
Triglycerides: 94 mg/dL (ref 0–149)
VLDL Cholesterol Cal: 19 mg/dL (ref 5–40)

## 2013-02-19 ENCOUNTER — Ambulatory Visit (INDEPENDENT_AMBULATORY_CARE_PROVIDER_SITE_OTHER): Payer: 59 | Admitting: Internal Medicine

## 2013-02-19 ENCOUNTER — Encounter: Payer: Self-pay | Admitting: Internal Medicine

## 2013-02-19 VITALS — BP 128/80 | HR 82 | Temp 98.0°F | Wt 160.6 lb

## 2013-02-19 DIAGNOSIS — E785 Hyperlipidemia, unspecified: Secondary | ICD-10-CM

## 2013-02-19 DIAGNOSIS — F17200 Nicotine dependence, unspecified, uncomplicated: Secondary | ICD-10-CM

## 2013-02-19 DIAGNOSIS — R05 Cough: Secondary | ICD-10-CM

## 2013-02-19 DIAGNOSIS — E039 Hypothyroidism, unspecified: Secondary | ICD-10-CM

## 2013-02-19 DIAGNOSIS — J449 Chronic obstructive pulmonary disease, unspecified: Secondary | ICD-10-CM

## 2013-02-19 DIAGNOSIS — Z23 Encounter for immunization: Secondary | ICD-10-CM

## 2013-02-19 DIAGNOSIS — F411 Generalized anxiety disorder: Secondary | ICD-10-CM

## 2013-02-19 MED ORDER — ZOSTER VACCINE LIVE 19400 UNT/0.65ML ~~LOC~~ SOLR: 0.6500 mL | Freq: Once | SUBCUTANEOUS | Status: DC

## 2013-02-19 NOTE — Patient Instructions (Signed)
Continue current medications. 

## 2013-02-19 NOTE — Progress Notes (Signed)
Subjective:    Patient ID: Gabrielle White, female    DOB: 14-Oct-1945, 67 y.o.   MRN: [id number]  Chief Complaint  Patient presents with  . Medical Managment of Chronic Issues    4 month f/u  . Immunizations    will get RX for shingles vaccine  . other     colonoscopy 2-3 yrs ago, normal    HPI Since was this patient, she has been seen by our nurse practitioner, disc material. She also went to see Dr. Sherene Sires. She has been put on Symbicort implanted this has been helpful with her breathing. She continues to have cough and bronchial rattle. There has been no fever. Sputum is clear to white.  Chronic bronchial conditions likely have a multifactorial etiology. The most immediately reversible one is her persistence with smoking. Additional factors are probably related to her constant exposure to animals including dogs and horses as well as headache. She believes her house may have mold in it, but has not had it checked.  She points out an area of tissue at the right greater trochanter which has a lipomatous texture. She says the dermatologist has taken a soft in the past, but it always returns. It occasionally has some discomfort, but is generally not painful. It measures about 1 inch in diameter.  Current Outpatient Prescriptions on File Prior to Visit  Medication Sig Dispense Refill  . albuterol (VENTOLIN HFA) 108 (90 BASE) MCG/ACT inhaler INHALE TWO PUFFS EVERY 4 HOURS AS NEEDED FOR COUGH OR SHORTNESS OF BREATH.  18 each  3  . atorvastatin (LIPITOR) 40 MG tablet One daily to lower cholesterol  90 tablet  1  . budesonide-formoterol (SYMBICORT) 160-4.5 MCG/ACT inhaler Take 2 puffs first thing in am and then another 2 puffs about 12 hours later.  1 Inhaler  12  . Cholecalciferol (VITAMIN D3) 1000 UNITS CAPS Take 1 capsule by mouth daily.        Marland Kitchen levothyroxine (LEVOTHROID) 25 MCG tablet One daily for thyroid supplement  90 tablet  4  . losartan (COZAAR) 50 MG tablet Take one tablet once daily   90 tablet  1  . sertraline (ZOLOFT) 100 MG tablet Take 1 tablet (100 mg total) by mouth daily.  30 tablet  5  . ALPRAZolam (XANAX) 0.5 MG tablet TAKE ONE TABLET BY MOUTH EVERY NIGHT AT BEDTIME AS NEEDED FOR SLEEP  30 tablet  0   No current facility-administered medications on file prior to visit.    Review of Systems  Constitutional: Positive for fatigue. Negative for fever, chills, diaphoresis, activity change, appetite change and unexpected weight change.  HENT: Negative.   Eyes: Negative.   Respiratory: Positive for cough and shortness of breath. Negative for wheezing.   Cardiovascular: Negative for chest pain, palpitations and leg swelling.  Gastrointestinal: Negative for nausea, vomiting, abdominal pain and abdominal distention.  Endocrine:       History hypothyroidism  Genitourinary: Negative.   Musculoskeletal: Negative for arthralgias, back pain, gait problem and myalgias.  Skin: Negative.   Neurological: Negative for dizziness, tremors, syncope, speech difficulty, weakness, light-headedness and headaches.  Psychiatric/Behavioral:       Feeling depressed sometimes.       Objective:   Physical Exam  Constitutional: She is oriented to person, place, and time. She appears well-developed and well-nourished. No distress.  HENT:  Head: Normocephalic and atraumatic.  Right Ear: External ear normal.  Left Ear: External ear normal.  Nose: Nose normal.  Eyes: Conjunctivae and EOM  are normal. Pupils are equal, round, and reactive to light. Left eye exhibits no discharge.  Corrective lenses.  Neck: Normal range of motion. Neck supple. No JVD present. No tracheal deviation present. No thyromegaly present.  Cardiovascular: Normal rate, regular rhythm, normal heart sounds and intact distal pulses.  Exam reveals no gallop and no friction rub.   No murmur heard. Pulmonary/Chest: Effort normal. No respiratory distress. She has no wheezes. She has rales.  Bilateral bronchial rattle.  Partial clearing with cough. No wheezing. No dyspnea at rest and when talking.  Abdominal: Soft. Bowel sounds are normal. She exhibits no distension and no mass. There is no tenderness.  Musculoskeletal: Normal range of motion. She exhibits no edema and no tenderness.  Lymphadenopathy:    She has no cervical adenopathy.  Neurological: She is alert and oriented to person, place, and time. No cranial nerve deficit. Coordination normal.  Skin: Skin is warm and dry. No rash noted. She is not diaphoretic. No erythema. No pallor.  1S diameter lipoma of the right greater trochanter with some scarring around it secondary to previous removals.  Psychiatric: She has a normal mood and affect. Her behavior is normal. Thought content normal.      Office Visit on 02/19/2013  Component Date Value Range Status  . HM Colonoscopy 04/06/2009 normal, repeat 10 yrs   Final  . HM Dexa Scan 07/10/2007 Narda Amber, Osteopenia   Final  Appointment on 02/17/2013  Component Date Value Range Status  . TSH 02/17/2013 5.860* 0.450 - 4.500 uIU/mL Final  . Cholesterol, Total 02/17/2013 184  100 - 199 mg/dL Final  . Triglycerides 02/17/2013 94  0 - 149 mg/dL Final  . HDL 16/01/9603 61  >39 mg/dL Final   Comment: According to ATP-III Guidelines, HDL-C >59 mg/dL is considered a                          negative risk factor for CHD.  Marland Kitchen VLDL Cholesterol Cal 02/17/2013 19  5 - 40 mg/dL Final  . LDL Calculated 02/17/2013 540* 0 - 99 mg/dL Final  . Chol/HDL Ratio 02/17/2013 3.0  0.0 - 4.4 ratio units Final   Comment:                                   T. Chol/HDL Ratio                                                                      Men  Women                                                        1/2 Avg.Risk  3.4    3.3  Avg.Risk  5.0    4.4                                                         2X Avg.Risk  9.6    7.1                                                          3X Avg.Risk 23.4   11.0       Assessment & Plan:  1. Need for prophylactic vaccination and inoculation against other combinations of diseases Prescription is written for this patient. She will get at pharmacy. - zoster vaccine live, PF, (ZOSTAVAX) 41324 UNT/0.65ML injection; Inject 19,400 Units into the skin once.  Dispense: 1 each; Refill: 0  2. Anxiety state, unspecified Chronic and unchanging. Recently had a person in her neighborhood andl her alprazolam. She has decided not to take this medication.  3. Unspecified hypothyroidism Slightly high TSH despite taking levothyroxine. She admits that she skips this drug in the mornings frequently.  4. Chronic cough Related to chronic bronchitis secondary to smoking and environmental exposures  5. Chronic obstructive asthma, unspecified Improved on Symbicort  6. Other and unspecified hyperlipidemia Controlled  7. Smoker Encouraged to stop

## 2013-04-10 ENCOUNTER — Telehealth: Payer: Self-pay | Admitting: *Deleted

## 2013-04-10 ENCOUNTER — Other Ambulatory Visit: Payer: Self-pay | Admitting: *Deleted

## 2013-04-10 NOTE — Telephone Encounter (Signed)
Acyclovir 200 mg, Disp 50 , Sig: Take one 5 times daily until rash is improving. Refill 5 times.

## 2013-04-10 NOTE — Telephone Encounter (Signed)
Patient called and stated that you gave her a Herpes medication before in the past and would like a refill on it for an outbreak. Please Advise.

## 2013-04-11 ENCOUNTER — Other Ambulatory Visit: Payer: Self-pay | Admitting: *Deleted

## 2013-04-11 MED ORDER — ACYCLOVIR 200 MG PO CAPS: ORAL_CAPSULE | ORAL | Status: DC

## 2013-04-11 NOTE — Telephone Encounter (Signed)
Patient notified and Rx faxed into Pharmacy

## 2013-04-19 DIAGNOSIS — L02229 Furuncle of trunk, unspecified: Secondary | ICD-10-CM | POA: Insufficient documentation

## 2013-05-23 ENCOUNTER — Other Ambulatory Visit: Payer: Self-pay | Admitting: Internal Medicine

## 2013-06-17 ENCOUNTER — Encounter: Payer: Self-pay | Admitting: Internal Medicine

## 2013-06-17 ENCOUNTER — Ambulatory Visit (INDEPENDENT_AMBULATORY_CARE_PROVIDER_SITE_OTHER): Payer: 59 | Admitting: Internal Medicine

## 2013-06-17 VITALS — BP 132/80 | HR 87 | Temp 98.6°F | Resp 20 | Ht 65.0 in | Wt 165.2 lb

## 2013-06-17 DIAGNOSIS — L02229 Furuncle of trunk, unspecified: Secondary | ICD-10-CM

## 2013-06-17 DIAGNOSIS — R0609 Other forms of dyspnea: Secondary | ICD-10-CM

## 2013-06-17 DIAGNOSIS — F17200 Nicotine dependence, unspecified, uncomplicated: Secondary | ICD-10-CM

## 2013-06-17 DIAGNOSIS — E785 Hyperlipidemia, unspecified: Secondary | ICD-10-CM

## 2013-06-17 DIAGNOSIS — R739 Hyperglycemia, unspecified: Secondary | ICD-10-CM

## 2013-06-17 DIAGNOSIS — R002 Palpitations: Secondary | ICD-10-CM

## 2013-06-17 DIAGNOSIS — R0989 Other specified symptoms and signs involving the circulatory and respiratory systems: Secondary | ICD-10-CM

## 2013-06-17 DIAGNOSIS — R5381 Other malaise: Secondary | ICD-10-CM

## 2013-06-17 DIAGNOSIS — R0600 Dyspnea, unspecified: Secondary | ICD-10-CM

## 2013-06-17 DIAGNOSIS — R053 Chronic cough: Secondary | ICD-10-CM

## 2013-06-17 DIAGNOSIS — R5383 Other fatigue: Secondary | ICD-10-CM

## 2013-06-17 DIAGNOSIS — F411 Generalized anxiety disorder: Secondary | ICD-10-CM

## 2013-06-17 DIAGNOSIS — R059 Cough, unspecified: Secondary | ICD-10-CM

## 2013-06-17 DIAGNOSIS — R7309 Other abnormal glucose: Secondary | ICD-10-CM

## 2013-06-17 DIAGNOSIS — R05 Cough: Secondary | ICD-10-CM

## 2013-06-17 DIAGNOSIS — E039 Hypothyroidism, unspecified: Secondary | ICD-10-CM

## 2013-06-17 DIAGNOSIS — J449 Chronic obstructive pulmonary disease, unspecified: Secondary | ICD-10-CM

## 2013-06-17 MED ORDER — ALPRAZOLAM 0.5 MG PO TABS: ORAL_TABLET | ORAL | Status: DC

## 2013-06-17 NOTE — Progress Notes (Signed)
Patient ID: Gabrielle White, female   DOB: [birth year], 68 y.o.   MRN: [id number]    Location:    [facility]  Place of Service:  OFFICE   Allergies  Allergen Reactions  . Cafergot   . Codeine   . Fenoprofen Calcium   . Nalfon [Fenoprofen]     Chief Complaint  Patient presents with  . Follow-up    HPI:  Left foot at the last three toes has been numb for the last several months.  Anxiety state, unspecified - benefits from ALPRAZolam (XANAX) 0.5 MG tablet  Unspecified hypothyroidism: recheck  Other and unspecified hyperlipidemia: recheck in the future  Chronic cough: peersists  Hyperglycemia: needs recheck  Other malaise and fatigue: persists  Smoker: continues  Dyspnea: unchanged  Palpitations: rare  Chronic obstructive asthma, unspecified: unchanged  Boil of trunk: Jan 2015. Resolved. Residual scar and sensitive area.    Medications: Patient's Medications  New Prescriptions   No medications on file  Previous Medications   ACYCLOVIR (ZOVIRAX) 200 MG CAPSULE    Take one tablet five times daily until rash improves   ALBUTEROL (VENTOLIN HFA) 108 (90 BASE) MCG/ACT INHALER    INHALE TWO PUFFS EVERY 4 HOURS AS NEEDED FOR COUGH OR SHORTNESS OF BREATH.   ATORVASTATIN (LIPITOR) 40 MG TABLET    TAKE 1 TABLET EVERY DAY  TO  LOWER  CHOLESTEROL   BUDESONIDE-FORMOTEROL (SYMBICORT) 160-4.5 MCG/ACT INHALER    Take 2 puffs first thing in am and then another 2 puffs about 12 hours later.   CHOLECALCIFEROL (VITAMIN D3) 1000 UNITS CAPS    Take 1 capsule by mouth daily.     LEVOTHYROXINE (LEVOTHROID) 25 MCG TABLET    One daily for thyroid supplement   LOSARTAN (COZAAR) 50 MG TABLET    Take one tablet once daily   SERTRALINE (ZOLOFT) 100 MG TABLET    Take 1 tablet (100 mg total) by mouth daily.   ZOSTER VACCINE LIVE, PF, (ZOSTAVAX) 25427 UNT/0.65ML INJECTION    Inject 19,400 Units into the skin once.  Modified Medications   Modified Medication Previous Medication   ALPRAZOLAM (XANAX)  0.5 MG TABLET ALPRAZolam (XANAX) 0.5 MG tablet      TAKE ONE TABLET BY MOUTH EVERY NIGHT AT BEDTIME AS NEEDED FOR SLEEP    TAKE ONE TABLET BY MOUTH EVERY NIGHT AT BEDTIME AS NEEDED FOR SLEEP  Discontinued Medications   No medications on file     Review of Systems  Constitutional: Positive for fatigue. Negative for fever, chills, diaphoresis, activity change, appetite change and unexpected weight change.  HENT: Negative.   Eyes: Negative.   Respiratory: Positive for cough and shortness of breath. Negative for wheezing.   Cardiovascular: Negative for chest pain, palpitations and leg swelling.  Gastrointestinal: Negative for nausea, vomiting, abdominal pain and abdominal distention.  Endocrine:       History hypothyroidism  Genitourinary: Negative.   Musculoskeletal: Negative for arthralgias, back pain, gait problem and myalgias.  Skin: Negative.   Neurological: Negative for dizziness, tremors, syncope, speech difficulty, weakness, light-headedness and headaches.  Psychiatric/Behavioral:       Feeling depressed sometimes.    Filed Vitals:   06/17/13 1509  BP: 132/80  Pulse: 87  Temp: 98.6 F (37 C)  TempSrc: Oral  Resp: 20  Height: 5\' 5"  (1.651 m)  Weight: 165 lb 3.2 oz (74.934 kg)  SpO2: 94%   Physical Exam  Constitutional: She is oriented to person, place, and time. She appears well-developed and well-nourished. No  distress.  HENT:  Head: Normocephalic and atraumatic.  Right Ear: External ear normal.  Left Ear: External ear normal.  Nose: Nose normal.  Eyes: Conjunctivae and EOM are normal. Pupils are equal, round, and reactive to light. Left eye exhibits no discharge.  Corrective lenses.  Neck: Normal range of motion. Neck supple. No JVD present. No tracheal deviation present. No thyromegaly present.  Cardiovascular: Normal rate, regular rhythm, normal heart sounds and intact distal pulses.  Exam reveals no gallop and no friction rub.   No murmur  heard. Pulmonary/Chest: Effort normal. No respiratory distress. She has wheezes. She has rales.  Bilateral bronchial rattle. Partial clearing with cough. No wheezing. No dyspnea at rest and when talking.  Abdominal: Soft. Bowel sounds are normal. She exhibits no distension and no mass. There is no tenderness.  Musculoskeletal: Normal range of motion. She exhibits no edema and no tenderness.  Lymphadenopathy:    She has no cervical adenopathy.  Neurological: She is alert and oriented to person, place, and time. No cranial nerve deficit. Coordination normal.  Skin: Skin is warm and dry. No rash noted. She is not diaphoretic. No erythema. No pallor.   lipoma of the right greater trochanter with some scarring around it secondary to previous removals. Small scar under right ribs from prior boil.  Psychiatric: She has a normal mood and affect. Her behavior is normal. Thought content normal.     Labs reviewed: No visits with results within 3 Month(s) from this visit. Latest known visit with results is:  Office Visit on 02/19/2013  Component Date Value Ref Range Status  . HM Colonoscopy 04/06/2009 normal, repeat 10 yrs   Final  . HM Dexa Scan 07/10/2007 [place], Osteopenia   Final      Assessment/Plan  Anxiety state, unspecified - benefits from ALPRAZolam Duanne Moron) 0.5 MG tablet  Unspecified hypothyroidism: needs recheck  Other and unspecified hyperlipidemia: needs recheck  Chronic cough: persists. Related to chronic secretions from smoking and possible allergy to hay and mold and animal dander.  Hyperglycemia: normal at last check up  Other malaise and fatigue: persistent. Related to depression and chronic lung problems. 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 Resolved, but has a residual scar.

## 2013-06-17 NOTE — Patient Instructions (Signed)
Continue current medications. 

## 2013-06-18 LAB — HEMOGLOBIN A1C
Est. average glucose Bld gHb Est-mCnc: 123 mg/dL
HEMOGLOBIN A1C: 5.9 % — AB (ref 4.8–5.6)

## 2013-06-18 LAB — TSH: TSH: 8.89 u[IU]/mL — AB (ref 0.450–4.500)

## 2013-06-19 ENCOUNTER — Other Ambulatory Visit: Payer: Self-pay | Admitting: *Deleted

## 2013-06-19 MED ORDER — LEVOTHYROXINE SODIUM 50 MCG PO TABS: ORAL_TABLET | ORAL | Status: DC

## 2013-06-19 NOTE — Telephone Encounter (Signed)
Pt notified VIA phone and RX sent to the pharmacy for the 50 mcg Levothyroxine to [facility]

## 2013-07-29 ENCOUNTER — Encounter: Payer: Self-pay | Admitting: Internal Medicine

## 2013-09-29 ENCOUNTER — Other Ambulatory Visit: Payer: Self-pay | Admitting: *Deleted

## 2013-09-29 DIAGNOSIS — F411 Generalized anxiety disorder: Secondary | ICD-10-CM

## 2013-09-29 MED ORDER — ALPRAZOLAM 0.5 MG PO TABS: ORAL_TABLET | ORAL | Status: DC

## 2013-09-29 NOTE — Telephone Encounter (Signed)
Patient called and wanted her Rx called into pharmacy at [facility] Called in and notified patient.

## 2013-10-07 ENCOUNTER — Encounter: Payer: Self-pay | Admitting: Internal Medicine

## 2013-10-07 ENCOUNTER — Ambulatory Visit (INDEPENDENT_AMBULATORY_CARE_PROVIDER_SITE_OTHER): Payer: 59 | Admitting: Internal Medicine

## 2013-10-07 VITALS — BP 126/74 | HR 86 | Wt 167.4 lb

## 2013-10-07 DIAGNOSIS — J441 Chronic obstructive pulmonary disease with (acute) exacerbation: Secondary | ICD-10-CM

## 2013-10-07 DIAGNOSIS — R739 Hyperglycemia, unspecified: Secondary | ICD-10-CM

## 2013-10-07 DIAGNOSIS — R0789 Other chest pain: Secondary | ICD-10-CM | POA: Insufficient documentation

## 2013-10-07 DIAGNOSIS — I10 Essential (primary) hypertension: Secondary | ICD-10-CM | POA: Insufficient documentation

## 2013-10-07 DIAGNOSIS — E039 Hypothyroidism, unspecified: Secondary | ICD-10-CM

## 2013-10-07 DIAGNOSIS — R079 Chest pain, unspecified: Secondary | ICD-10-CM | POA: Insufficient documentation

## 2013-10-07 DIAGNOSIS — E785 Hyperlipidemia, unspecified: Secondary | ICD-10-CM

## 2013-10-07 DIAGNOSIS — R7309 Other abnormal glucose: Secondary | ICD-10-CM

## 2013-10-07 MED ORDER — LOSARTAN POTASSIUM 50 MG PO TABS: ORAL_TABLET | ORAL | Status: DC

## 2013-10-07 MED ORDER — ALBUTEROL SULFATE HFA 108 (90 BASE) MCG/ACT IN AERS: INHALATION_SPRAY | RESPIRATORY_TRACT | Status: DC

## 2013-10-07 MED ORDER — LEVOTHYROXINE SODIUM 50 MCG PO TABS: ORAL_TABLET | ORAL | Status: DC

## 2013-10-07 NOTE — Progress Notes (Signed)
Patient ID: Gabrielle White, female   DOB: 09/26/1945, 68 y.o.   MRN: [id number]    Location:    [facility]  Place of Service:  OFFICE    Allergies  Allergen Reactions  . Cafergot   . Codeine   . Fenoprofen Calcium   . Nalfon [Fenoprofen]     Chief Complaint  Patient presents with  . Acute Visit    pain under ribs x 1 month times (3-4 times). & cramping in feet/toes/lower legs    HPI:  Unspecified essential hypertension - controlled  Bronchitis, chronic obstructive, with exacerbation - generally controlled with her inhalers. Still smoking  Chest pain, unspecified: mainly at the left lower chest. Intermittent. Not accompanied by fever, increased dyspnea, nausea, diaphoresis, or radiation of the pain to the neck, arm, or interscapular area. Denies reflux. No change in stools.  Unspecified hypothyroidism - compensated  Hyperglycemia: follow lab  Other and unspecified hyperlipidemia -follow lab    Medications: Patient's Medications  New Prescriptions   No medications on file  Previous Medications   ALPRAZOLAM (XANAX) 0.5 MG TABLET    TAKE ONE TABLET BY MOUTH EVERY NIGHT AT BEDTIME AS NEEDED FOR SLEEP   ATORVASTATIN (LIPITOR) 40 MG TABLET    TAKE 1 TABLET EVERY DAY  TO  LOWER  CHOLESTEROL   BUDESONIDE-FORMOTEROL (SYMBICORT) 160-4.5 MCG/ACT INHALER    Take 2 puffs first thing in am and then another 2 puffs about 12 hours later.   CHOLECALCIFEROL (VITAMIN D3) 1000 UNITS CAPS    Take 1 capsule by mouth daily.     SERTRALINE (ZOLOFT) 100 MG TABLET    Take 1 tablet (100 mg total) by mouth daily.   ZOSTER VACCINE LIVE, PF, (ZOSTAVAX) 29518 UNT/0.65ML INJECTION    Inject 19,400 Units into the skin once.  Modified Medications   Modified Medication Previous Medication   ALBUTEROL (VENTOLIN HFA) 108 (90 BASE) MCG/ACT INHALER albuterol (VENTOLIN HFA) 108 (90 BASE) MCG/ACT inhaler      INHALE TWO PUFFS EVERY 4 HOURS AS NEEDED FOR COUGH OR SHORTNESS OF BREATH.    INHALE TWO PUFFS EVERY 4  HOURS AS NEEDED FOR COUGH OR SHORTNESS OF BREATH.   LEVOTHYROXINE (SYNTHROID, LEVOTHROID) 50 MCG TABLET levothyroxine (SYNTHROID, LEVOTHROID) 50 MCG tablet      Take 1 tablet by mouth daily before breakfast for thyroid supplement    Take 1 tablet by mouth daily before breakfast for Hypothyriodism   LOSARTAN (COZAAR) 50 MG TABLET losartan (COZAAR) 50 MG tablet      Take one tablet once daily    Take one tablet once daily  Discontinued Medications   ACYCLOVIR (ZOVIRAX) 200 MG CAPSULE    Take one tablet five times daily until rash improves     Review of Systems  Constitutional: Positive for fatigue. Negative for fever, chills, diaphoresis, activity change, appetite change and unexpected weight change.  HENT: Negative.   Eyes: Negative.   Respiratory: Positive for cough and shortness of breath. Negative for wheezing.   Cardiovascular: Negative for chest pain, palpitations and leg swelling.  Gastrointestinal: Negative for nausea, vomiting, abdominal pain and abdominal distention.  Endocrine:       History hypothyroidism  Genitourinary: Negative.   Musculoskeletal: Negative for arthralgias, back pain, gait problem and myalgias.  Skin: Negative.   Neurological: Negative for dizziness, tremors, syncope, speech difficulty, weakness, light-headedness and headaches.  Psychiatric/Behavioral:       Feeling depressed sometimes.    Filed Vitals:   10/07/13 1630  BP: 126/74  Pulse: 86  Weight: 167 lb 6.4 oz (75.932 kg)   Body mass index is 27.86 kg/(m^2).  Physical Exam  Constitutional: She is oriented to person, place, and time. She appears well-developed and well-nourished. No distress.  HENT:  Head: Normocephalic and atraumatic.  Right Ear: External ear normal.  Left Ear: External ear normal.  Nose: Nose normal.  Eyes: Conjunctivae and EOM are normal. Pupils are equal, round, and reactive to light. Left eye exhibits no discharge.  Corrective lenses.  Neck: Normal range of motion. Neck  supple. No JVD present. No tracheal deviation present. No thyromegaly present.  Cardiovascular: Normal rate, regular rhythm, normal heart sounds and intact distal pulses.  Exam reveals no gallop and no friction rub.   No murmur heard. Pulmonary/Chest: Effort normal. No respiratory distress. She has wheezes. She has rales. She exhibits tenderness (left lower chest and to a lesser degree right lower chest).  Bilateral bronchial rattle. Partial clearing with cough. No wheezing. No dyspnea at rest and when talking.  Abdominal: Soft. Bowel sounds are normal. She exhibits no distension and no mass. There is no tenderness.  Musculoskeletal: Normal range of motion. She exhibits no edema and no tenderness.  Lymphadenopathy:    She has no cervical adenopathy.  Neurological: She is alert and oriented to person, place, and time. No cranial nerve deficit. Coordination normal.  Skin: Skin is warm and dry. No rash noted. She is not diaphoretic. No erythema. No pallor.   lipoma of the right greater trochanter with some scarring around it secondary to previous removals. Small scar under right ribs from prior boil.  Psychiatric: She has a normal mood and affect. Her behavior is normal. Thought content normal.     Labs reviewed: No visits with results within 3 Month(s) from this visit. Latest known visit with results is:  Office Visit on 06/17/2013  Component Date Value Ref Range Status  . TSH 06/17/2013 8.890* 0.450 - 4.500 uIU/mL Final  . Hemoglobin A1C 06/17/2013 5.9* 4.8 - 5.6 % Final   Comment:          Increased risk for diabetes: 5.7 - 6.4                                   Diabetes: >6.4                                   Glycemic control for adults with diabetes: <7.0  . Estimated average glucose 06/17/2013 123   Final      Assessment/Plan  1. Unspecified essential hypertension - losartan (COZAAR) 50 MG tablet; Take one tablet once daily  Dispense: 90 tablet; Refill: 0 - Comprehensive  metabolic panel; Future  2. Bronchitis, chronic obstructive, with exacerbation Stop smoking - albuterol (VENTOLIN HFA) 108 (90 BASE) MCG/ACT inhaler; INHALE TWO PUFFS EVERY 4 HOURS AS NEEDED FOR COUGH OR SHORTNESS OF BREATH.  Dispense: 18 each; Refill: 3  3. Chest pain, unspecified Etiology is not clear. I am not suspicious of CAD or PE. Abdomen is not tender. There is a history of being kicked by a horse in 2002 which caused tear in the spleen and liver. She also fell off a horse in 2005 and fractured left 3, 4, 5, and 6 ribs. Discussed further diagnostic testing such as CXR (normal in Sept 2014), CT abd,, or cardiac testing. I think  it is safe to just observe for now, so she has decided to forego further tests for the time being.  4. Unspecified hypothyroidism - levothyroxine (SYNTHROID, LEVOTHROID) 50 MCG tablet; Take 1 tablet by mouth daily before breakfast for thyroid supplement  Dispense: 90 tablet; Refill: 1 - TSH; Future  5. Hyperglycemia -cmp future  6. Other and unspecified hyperlipidemia - lipids in futurre - Lipid panel; Future

## 2013-10-08 ENCOUNTER — Other Ambulatory Visit: Payer: Self-pay | Admitting: *Deleted

## 2013-10-08 DIAGNOSIS — F411 Generalized anxiety disorder: Secondary | ICD-10-CM

## 2013-10-08 MED ORDER — ALPRAZOLAM 0.5 MG PO TABS: ORAL_TABLET | ORAL | Status: DC

## 2013-10-08 NOTE — Telephone Encounter (Signed)
Patient Requested 

## 2013-10-15 ENCOUNTER — Ambulatory Visit: Payer: 59 | Admitting: Internal Medicine

## 2013-10-20 ENCOUNTER — Telehealth: Payer: Self-pay | Admitting: *Deleted

## 2013-10-20 MED ORDER — PREDNISONE 5 MG PO TABS: ORAL_TABLET | ORAL | Status: DC

## 2013-10-20 NOTE — Telephone Encounter (Signed)
Patient called stating she wanted you to get the message that she is SOB. States that she was up all night last night with trouble breathing. Is on Ventolin and Symbicort but states that you have done Prednisone in the past and wants to know if we can call this in. Please Advise.

## 2013-10-20 NOTE — Telephone Encounter (Signed)
Patient came into the office. Spoke with patient and gave her medication directions and faxed to pharmacy

## 2013-10-20 NOTE — Telephone Encounter (Signed)
Rx Prednisone 5mg   Disp: 21 tablets  Sig: 6 tablets on day 1, 5 on day 2, 4 on day 3, 3 on day 4, 2 on day, and one on day 6 to help breathing.

## 2013-10-29 ENCOUNTER — Ambulatory Visit (INDEPENDENT_AMBULATORY_CARE_PROVIDER_SITE_OTHER): Payer: 59 | Admitting: Internal Medicine

## 2013-10-29 ENCOUNTER — Encounter: Payer: Self-pay | Admitting: Internal Medicine

## 2013-10-29 VITALS — BP 118/72 | HR 86 | Temp 98.1°F | Resp 14 | Ht 65.0 in | Wt 167.2 lb

## 2013-10-29 DIAGNOSIS — R32 Unspecified urinary incontinence: Secondary | ICD-10-CM | POA: Insufficient documentation

## 2013-10-29 LAB — POCT URINALYSIS DIPSTICK
Bilirubin, UA: NEGATIVE
Glucose, UA: NEGATIVE
Ketones, UA: NEGATIVE
NITRITE UA: NEGATIVE
PROTEIN UA: NEGATIVE
Spec Grav, UA: 1.01
UROBILINOGEN UA: NEGATIVE
pH, UA: 5

## 2013-10-29 MED ORDER — SOLIFENACIN SUCCINATE 10 MG PO TABS: ORAL_TABLET | ORAL | Status: DC

## 2013-10-29 NOTE — Progress Notes (Signed)
Patient ID: Gabrielle White, female   DOB: 12/10/1945, 68 y.o.   MRN: [id number]    Location:    [facility]  Place of Service:  OFFICE    Allergies  Allergen Reactions  . Cafergot   . Codeine   . Fenoprofen Calcium   . Nalfon [Fenoprofen]     Chief Complaint  Patient presents with  . Acute Visit    Possible UTI. Complains of leakage. No burning and No pressure    HPI:  Urinary incontinence, unspecified incontinence type - Plan: POC Urinalysis Dipstick, Culture, Urine  Unspecified urinary incontinence    Medications: Patient's Medications  New Prescriptions   No medications on file  Previous Medications   ALBUTEROL (VENTOLIN HFA) 108 (90 BASE) MCG/ACT INHALER    INHALE TWO PUFFS EVERY 4 HOURS AS NEEDED FOR COUGH OR SHORTNESS OF BREATH.   ALPRAZOLAM (XANAX) 0.5 MG TABLET    Take one tablet by mouth at bedtime as needed for rest   ATORVASTATIN (LIPITOR) 40 MG TABLET    TAKE 1 TABLET EVERY DAY  TO  LOWER  CHOLESTEROL   BUDESONIDE-FORMOTEROL (SYMBICORT) 160-4.5 MCG/ACT INHALER    Take 2 puffs first thing in am and then another 2 puffs about 12 hours later.   CHOLECALCIFEROL (VITAMIN D3) 1000 UNITS CAPS    Take 1 capsule by mouth daily.     LEVOTHYROXINE (SYNTHROID, LEVOTHROID) 50 MCG TABLET    Take 1 tablet by mouth daily before breakfast for thyroid supplement   LOSARTAN (COZAAR) 50 MG TABLET    Take one tablet once daily   SERTRALINE (ZOLOFT) 100 MG TABLET    Take 1 tablet (100 mg total) by mouth daily.   ZOSTER VACCINE LIVE, PF, (ZOSTAVAX) 82993 UNT/0.65ML INJECTION    Inject 19,400 Units into the skin once.  Modified Medications   No medications on file  Discontinued Medications   PREDNISONE (DELTASONE) 5 MG TABLET    Take 6 tablet on day 1, Take 5 tablets on day 2, Take 4 tablets on day 3, Take 3 tablets on day 4, Take 2 tablets on day 5 and Take 1 tablet on day 6 for shortness of breath     Review of Systems  Filed Vitals:   10/29/13 1340  BP: 118/72  Pulse: 86    Temp: 98.1 F (36.7 C)  TempSrc: Oral  Resp: 14  Height: 5\' 5"  (1.651 m)  Weight: 167 lb 3.2 oz (75.841 kg)  SpO2: 96%   Body mass index is 27.82 kg/(m^2).  Physical Exam  Constitutional: She is oriented to person, place, and time. She appears well-developed and well-nourished. No distress.  HENT:  Head: Normocephalic and atraumatic.  Right Ear: External ear normal.  Left Ear: External ear normal.  Nose: Nose normal.  Eyes: Conjunctivae and EOM are normal. Pupils are equal, round, and reactive to light. Left eye exhibits no discharge.  Corrective lenses.  Neck: Normal range of motion. Neck supple. No JVD present. No tracheal deviation present. No thyromegaly present.  Cardiovascular: Normal rate, regular rhythm, normal heart sounds and intact distal pulses.  Exam reveals no gallop and no friction rub.   No murmur heard. Pulmonary/Chest: Effort normal. No respiratory distress. She has wheezes. She has rales. She exhibits tenderness (left lower chest and to a lesser degree right lower chest).  Bilateral bronchial rattle. Partial clearing with cough. No wheezing. No dyspnea at rest and when talking.  Abdominal: Soft. Bowel sounds are normal. She exhibits no distension and no mass.  There is no tenderness.  Musculoskeletal: Normal range of motion. She exhibits no edema and no tenderness.  Lymphadenopathy:    She has no cervical adenopathy.  Neurological: She is alert and oriented to person, place, and time. No cranial nerve deficit. Coordination normal.  Skin: Skin is warm and dry. No rash noted. She is not diaphoretic. No erythema. No pallor.   lipoma of the right greater trochanter with some scarring around it secondary to previous removals. Small scar under right ribs from prior boil.  Psychiatric: She has a normal mood and affect. Her behavior is normal. Thought content normal.     Labs reviewed: Office Visit on 10/29/2013  Component Date Value Ref Range Status  . Color, UA  10/29/2013 amber   Final  . Clarity, UA 10/29/2013 cloudy   Final  . Glucose, UA 10/29/2013 negative   Final  . Bilirubin, UA 10/29/2013 negative   Final  . Ketones, UA 10/29/2013 negative   Final  . Spec Grav, UA 10/29/2013 1.010   Final  . Blood, UA 10/29/2013 trace   Final  . pH, UA 10/29/2013 5.0   Final  . Protein, UA 10/29/2013 negative   Final  . Urobilinogen, UA 10/29/2013 negative   Final  . Nitrite, UA 10/29/2013 negative   Final  . Leukocytes, UA 10/29/2013 small (1+)   Final      Assessment/Plan  1. Urinary incontinence, unspecified incontinence type - POC Urinalysis Dipstick - Culture, Urine  2. Unspecified urinary incontinence - solifenacin (VESICARE) 10 MG tablet; One daily to control bladder  Dispense: 28 tablet; Refill: 0

## 2013-10-31 LAB — URINE CULTURE: ORGANISM ID, BACTERIA: NO GROWTH

## 2013-11-10 ENCOUNTER — Telehealth: Payer: Self-pay | Admitting: *Deleted

## 2013-11-10 NOTE — Telephone Encounter (Signed)
Patient called and stated that she wanted another Rx for Prednisone for her breathing. Due to the hot weather she is having a hard time. The Prednisone worked the last time and wants more called in. Please Advise.

## 2013-11-10 NOTE — Telephone Encounter (Signed)
Renew prednisone 5 mg dosepack (6-5-4-3-2-1)

## 2013-11-11 MED ORDER — PREDNISONE (PAK) 5 MG PO TABS: ORAL_TABLET | ORAL | Status: DC

## 2013-11-11 NOTE — Telephone Encounter (Signed)
Patient Notified and faxed Rx into pharmacy 

## 2013-11-24 ENCOUNTER — Other Ambulatory Visit: Payer: Self-pay

## 2013-11-24 DIAGNOSIS — Z1231 Encounter for screening mammogram for malignant neoplasm of breast: Secondary | ICD-10-CM

## 2013-11-27 ENCOUNTER — Other Ambulatory Visit: Payer: Self-pay | Admitting: Internal Medicine

## 2013-11-27 NOTE — Telephone Encounter (Signed)
Patient Requested. Faxed to pharmacy.  

## 2013-12-03 ENCOUNTER — Telehealth: Payer: Self-pay | Admitting: *Deleted

## 2013-12-03 NOTE — Telephone Encounter (Signed)
Already had 2 rounds of prednsione, will need to be assessed clinically to provide more.

## 2013-12-03 NOTE — Telephone Encounter (Signed)
resent

## 2013-12-03 NOTE — Telephone Encounter (Signed)
Patient called wanting Prednisone called in again for SOB. Dr. Nyoka Cowden had called in Prednisone on 10/07/13 and 11/10/13. Patient wants it called into pharmacy again for the SOB. Please Advise.

## 2013-12-03 NOTE — Telephone Encounter (Signed)
Patient stated that she didn't want to make another appointment and that she already has one on the 16th and she will wait till then.

## 2013-12-04 ENCOUNTER — Ambulatory Visit
Admission: RE | Admit: 2013-12-04 | Discharge: 2013-12-04 | Disposition: A | Discharge status: Home / Self Care | Payer: Medicare PPO | Source: Ambulatory Visit

## 2013-12-04 DIAGNOSIS — Z1231 Encounter for screening mammogram for malignant neoplasm of breast: Secondary | ICD-10-CM

## 2013-12-09 ENCOUNTER — Other Ambulatory Visit: Payer: Self-pay | Admitting: Internal Medicine

## 2013-12-10 ENCOUNTER — Other Ambulatory Visit: Payer: 59

## 2013-12-10 DIAGNOSIS — I10 Essential (primary) hypertension: Secondary | ICD-10-CM

## 2013-12-10 DIAGNOSIS — E039 Hypothyroidism, unspecified: Secondary | ICD-10-CM

## 2013-12-10 DIAGNOSIS — E785 Hyperlipidemia, unspecified: Secondary | ICD-10-CM

## 2013-12-11 LAB — COMPREHENSIVE METABOLIC PANEL
ALT: 14 IU/L (ref 0–32)
AST: 13 IU/L (ref 0–40)
Albumin/Globulin Ratio: 1.8 (ref 1.1–2.5)
Albumin: 4.1 g/dL (ref 3.6–4.8)
Alkaline Phosphatase: 102 IU/L (ref 39–117)
BUN/Creatinine Ratio: 14 (ref 11–26)
BUN: 11 mg/dL (ref 8–27)
CALCIUM: 9.7 mg/dL (ref 8.7–10.3)
CO2: 24 mmol/L (ref 18–29)
CREATININE: 0.78 mg/dL (ref 0.57–1.00)
Chloride: 101 mmol/L (ref 97–108)
GFR calc Af Amer: 90 mL/min/{1.73_m2} (ref >59)
GFR calc non Af Amer: 78 mL/min/{1.73_m2} (ref >59)
GLOBULIN, TOTAL: 2.3 g/dL (ref 1.5–4.5)
Glucose: 92 mg/dL (ref 65–99)
Potassium: 4.8 mmol/L (ref 3.5–5.2)
SODIUM: 140 mmol/L (ref 134–144)
Total Bilirubin: 0.2 mg/dL (ref 0.0–1.2)
Total Protein: 6.4 g/dL (ref 6.0–8.5)

## 2013-12-11 LAB — LIPID PANEL
Chol/HDL Ratio: 3.4 ratio units (ref 0.0–4.4)
Cholesterol, Total: 183 mg/dL (ref 100–199)
HDL: 54 mg/dL (ref >39)
LDL CALC: 102 mg/dL — AB (ref 0–99)
TRIGLYCERIDES: 133 mg/dL (ref 0–149)
VLDL Cholesterol Cal: 27 mg/dL (ref 5–40)

## 2013-12-11 LAB — TSH: TSH: 5.21 u[IU]/mL — ABNORMAL HIGH (ref 0.450–4.500)

## 2013-12-17 ENCOUNTER — Encounter: Payer: Self-pay | Admitting: Internal Medicine

## 2013-12-17 ENCOUNTER — Ambulatory Visit (INDEPENDENT_AMBULATORY_CARE_PROVIDER_SITE_OTHER): Payer: 59 | Admitting: Internal Medicine

## 2013-12-17 VITALS — BP 130/80 | HR 78 | Temp 98.6°F | Resp 18 | Ht 65.0 in | Wt 163.2 lb

## 2013-12-17 DIAGNOSIS — F17200 Nicotine dependence, unspecified, uncomplicated: Secondary | ICD-10-CM

## 2013-12-17 DIAGNOSIS — I10 Essential (primary) hypertension: Secondary | ICD-10-CM

## 2013-12-17 DIAGNOSIS — N3946 Mixed incontinence: Secondary | ICD-10-CM

## 2013-12-17 DIAGNOSIS — F40240 Claustrophobia: Secondary | ICD-10-CM

## 2013-12-17 DIAGNOSIS — F40298 Other specified phobia: Secondary | ICD-10-CM

## 2013-12-17 DIAGNOSIS — J449 Chronic obstructive pulmonary disease, unspecified: Secondary | ICD-10-CM

## 2013-12-17 DIAGNOSIS — Z23 Encounter for immunization: Secondary | ICD-10-CM

## 2013-12-17 MED ORDER — PREDNISONE (PAK) 5 MG PO TABS: ORAL_TABLET | ORAL | Status: DC

## 2013-12-17 MED ORDER — BUPROPION HCL 100 MG PO TABS: ORAL_TABLET | ORAL | Status: DC

## 2013-12-17 MED ORDER — MIRABEGRON ER 25 MG PO TB24: ORAL_TABLET | ORAL | Status: DC

## 2013-12-18 ENCOUNTER — Telehealth: Payer: Self-pay | Admitting: *Deleted

## 2013-12-18 MED ORDER — DOXYCYCLINE HYCLATE 100 MG PO TABS
ORAL_TABLET | ORAL | Status: DC
Start: 2013-12-18 — End: 2014-02-17

## 2013-12-18 NOTE — Telephone Encounter (Signed)
Doxycycline 100 mg  Disp: 14 Sig: One twice daily for infection

## 2013-12-18 NOTE — Telephone Encounter (Signed)
Patient Notified and faxed Rx to pharmacy. 

## 2013-12-18 NOTE — Telephone Encounter (Signed)
Patient called complaining about a sore throat and not feeling well. Was seen yesterday and received a flu injection and thinks it is coming from that. Would like to know if you would call her in an antibiotic. Please Advise.

## 2013-12-24 ENCOUNTER — Other Ambulatory Visit: Payer: Self-pay | Admitting: Internal Medicine

## 2014-01-21 NOTE — Progress Notes (Signed)
Patient ID: Gabrielle White, female   DOB: Jun 02, 1945, 68 y.o.   MRN: [id number]    Facility  [facility]    Place of Service:   OFFICE   Allergies  Allergen Reactions  . Cafergot   . Codeine   . Fenoprofen Calcium   . Nalfon [Fenoprofen]     Chief Complaint  Patient presents with  . Medical Management of Chronic Issues    blader still leaking, feels as though she can't catch her breath, throat pain x  2 mos    HPI:  Unspecified essential hypertension: controlled  Tobacco use disorder: continues to smoke despite chronic coough and dyspnea. She is aware of the adverse effect smoking is having on here health.  Chronic obstructive asthma, unspecified - continues with dyspnea. Occasional wheeze  Claustrophobia - worse  Mixed incontinence: worries about bladder leakage. Occurs with cough, bending over, and 'key in the door' episodes.  Need for prophylactic vaccination and inoculation against influenza    Medications: Patient's Medications  New Prescriptions   BUPROPION (WELLBUTRIN) 100 MG TABLET    One daily to help nerves   DOXYCYCLINE (VIBRA-TABS) 100 MG TABLET    Take one tablet by mouth twice daily for infection   MIRABEGRON ER (MYRBETRIQ) 25 MG TB24 TABLET    One daily to help bladder control   PREDNISONE (STERAPRED UNI-PAK) 5 MG TABS TABLET    6 tablets day 1, 5 on day 2, 4 on day 3, 3 on day 4, 2 on day 5, and 1 on day 6.  Previous Medications   ALBUTEROL (VENTOLIN HFA) 108 (90 BASE) MCG/ACT INHALER    INHALE TWO PUFFS EVERY 4 HOURS AS NEEDED FOR COUGH OR SHORTNESS OF BREATH.   ATORVASTATIN (LIPITOR) 40 MG TABLET    TAKE 1 TABLET EVERY DAY  TO  LOWER  CHOLESTEROL   BUDESONIDE-FORMOTEROL (SYMBICORT) 160-4.5 MCG/ACT INHALER    Take 2 puffs first thing in am and then another 2 puffs about 12 hours later.   CHOLECALCIFEROL (VITAMIN D3) 1000 UNITS CAPS    Take 1 capsule by mouth daily.     LEVOTHYROXINE (SYNTHROID, LEVOTHROID) 50 MCG TABLET    Take 1 tablet by mouth daily before  breakfast for thyroid supplement   LOSARTAN (COZAAR) 50 MG TABLET    TAKE 1 TABLET ONE TIME DAILY   PREDNISONE (STERAPRED UNI-PAK) 5 MG TABS TABLET    Take 6 tablets by mouth on day 1, Take 5 tablets by mouth on day 2, Take 4 tablets by mouth on day 3, Take 3 tablets by mouth on day 4, Take 2 tablets by mouth on day 5, and take 1 tablets by mouth on day 6.   SERTRALINE (ZOLOFT) 100 MG TABLET    Take 1 tablet (100 mg total) by mouth daily.   ZOSTER VACCINE LIVE, PF, (ZOSTAVAX) 37902 UNT/0.65ML INJECTION    Inject 19,400 Units into the skin once.  Modified Medications   Modified Medication Previous Medication   ALPRAZOLAM (XANAX) 0.5 MG TABLET ALPRAZolam (XANAX) 0.5 MG tablet      TAKE 1 TABLET BY MOUTH EVERY NIGHT AT BEDTIME AS NEEDED FOR SLEEP    TAKE 1 TABLET BY MOUTH EVERY NIGHT AT BEDTIME AS NEEDED FOR SLEEP  Discontinued Medications   SOLIFENACIN (VESICARE) 10 MG TABLET    One daily to control bladder     Review of Systems  Constitutional: Positive for fatigue. Negative for fever, chills, diaphoresis, activity change, appetite change and unexpected weight change.  HENT:  Negative.   Eyes: Negative.   Respiratory: Positive for cough and shortness of breath. Negative for wheezing.   Cardiovascular: Negative for chest pain, palpitations and leg swelling.  Gastrointestinal: Negative for nausea, vomiting, abdominal pain and abdominal distention.  Endocrine:       History hypothyroidism  Genitourinary: Negative.   Musculoskeletal: Negative for arthralgias, back pain, gait problem and myalgias.  Skin: Negative.   Neurological: Negative for dizziness, tremors, syncope, speech difficulty, weakness, light-headedness and headaches.  Psychiatric/Behavioral:       Feeling depressed sometimes.    Filed Vitals:   12/17/13 1639  BP: 130/80  Pulse: 78  Temp: 98.6 F (37 C)  TempSrc: Oral  Resp: 18  Height: 5\' 5"  (1.651 m)  Weight: 163 lb 3.2 oz (74.027 kg)  SpO2: 93%   Body mass index is  27.16 kg/(m^2).  Physical Exam  Constitutional: She is oriented to person, place, and time. She appears well-developed and well-nourished. No distress.  HENT:  Head: Normocephalic and atraumatic.  Right Ear: External ear normal.  Left Ear: External ear normal.  Nose: Nose normal.  Eyes: Conjunctivae and EOM are normal. Pupils are equal, round, and reactive to light. Left eye exhibits no discharge.  Corrective lenses.  Neck: Normal range of motion. Neck supple. No JVD present. No tracheal deviation present. No thyromegaly present.  Cardiovascular: Normal rate, regular rhythm, normal heart sounds and intact distal pulses.  Exam reveals no gallop and no friction rub.   No murmur heard. Pulmonary/Chest: Effort normal. No respiratory distress. She has wheezes. She has rales. She exhibits tenderness (left lower chest and to a lesser degree right lower chest).  Bilateral bronchial rattle. Partial clearing with cough. No wheezing. No dyspnea at rest and when talking.  Abdominal: Soft. Bowel sounds are normal. She exhibits no distension and no mass. There is no tenderness.  Musculoskeletal: Normal range of motion. She exhibits no edema and no tenderness.  Lymphadenopathy:    She has no cervical adenopathy.  Neurological: She is alert and oriented to person, place, and time. No cranial nerve deficit. Coordination normal.  Skin: Skin is warm and dry. No rash noted. She is not diaphoretic. No erythema. No pallor.   lipoma of the right greater trochanter with some scarring around it secondary to previous removals. Small scar under right ribs from prior boil.  Psychiatric: She has a normal mood and affect. Her behavior is normal. Thought content normal.     Labs reviewed: Appointment on 12/10/2013  Component Date Value Ref Range Status  . TSH 12/10/2013 5.210* 0.450 - 4.500 uIU/mL Final  . Cholesterol, Total 12/10/2013 183  100 - 199 mg/dL Final  . Triglycerides 12/10/2013 133  0 - 149 mg/dL Final   . HDL 12/10/2013 54  >39 mg/dL Final   Comment: According to ATP-III Guidelines, HDL-C >59 mg/dL is considered a                          negative risk factor for CHD.  Marland Kitchen VLDL Cholesterol Cal 12/10/2013 27  5 - 40 mg/dL Final  . LDL Calculated 12/10/2013 102* 0 - 99 mg/dL Final  . Chol/HDL Ratio 12/10/2013 3.4  0.0 - 4.4 ratio units Final   Comment:                                   T. Chol/HDL Ratio  Men  Women                                                        1/2 Avg.Risk  3.4    3.3                                                            Avg.Risk  5.0    4.4                                                         2X Avg.Risk  9.6    7.1                                                         3X Avg.Risk 23.4   11.0  . Glucose 12/10/2013 92  65 - 99 mg/dL Final  . BUN 12/10/2013 11  8 - 27 mg/dL Final  . Creatinine, Ser 12/10/2013 0.78  0.57 - 1.00 mg/dL Final  . GFR calc non Af Amer 12/10/2013 78  >59 mL/min/1.73 Final  . GFR calc Af Amer 12/10/2013 90  >59 mL/min/1.73 Final  . BUN/Creatinine Ratio 12/10/2013 14  11 - 26 Final  . Sodium 12/10/2013 140  134 - 144 mmol/L Final  . Potassium 12/10/2013 4.8  3.5 - 5.2 mmol/L Final  . Chloride 12/10/2013 101  97 - 108 mmol/L Final  . CO2 12/10/2013 24  18 - 29 mmol/L Final  . Calcium 12/10/2013 9.7  8.7 - 10.3 mg/dL Final  . Total Protein 12/10/2013 6.4  6.0 - 8.5 g/dL Final  . Albumin 12/10/2013 4.1  3.6 - 4.8 g/dL Final  . Globulin, Total 12/10/2013 2.3  1.5 - 4.5 g/dL Final  . Albumin/Globulin Ratio 12/10/2013 1.8  1.1 - 2.5 Final  . Total Bilirubin 12/10/2013 0.2  0.0 - 1.2 mg/dL Final  . Alkaline Phosphatase 12/10/2013 102  39 - 117 IU/L Final  . AST 12/10/2013 13  0 - 40 IU/L Final  . ALT 12/10/2013 14  0 - 32 IU/L Final  Office Visit on 10/29/2013  Component Date Value Ref Range Status  . Color, UA 10/29/2013 amber   Final  . Clarity, UA 10/29/2013  cloudy   Final  . Glucose, UA 10/29/2013 negative   Final  . Bilirubin, UA 10/29/2013 negative   Final  . Ketones, UA 10/29/2013 negative   Final  . Spec Grav, UA 10/29/2013 1.010   Final  . Blood, UA 10/29/2013 trace   Final  . pH, UA 10/29/2013 5.0   Final  . Protein, UA 10/29/2013 negative   Final  . Urobilinogen, UA 10/29/2013 negative   Final  . Nitrite, UA 10/29/2013 negative   Final  . Leukocytes, UA 10/29/2013 small (1+)   Final  .  Urine Culture, Routine 10/29/2013 Final report   Final  . Result 1 10/29/2013 No growth   Final     Assessment/Plan  1. Unspecified essential hypertension controlled  2. Tobacco use disorder Advised to stop smoking  3. Chronic obstructive asthma, unspecified - predniSONE (STERAPRED UNI-PAK) 5 MG TABS tablet; 6 tablets day 1, 5 on day 2, 4 on day 3, 3 on day 4, 2 on day 5, and 1 on day 6.  Dispense: 21 tablet; Refill: 0  4. Claustrophobia - buPROPion (WELLBUTRIN) 100 MG tablet; One daily to help nerves  Dispense: 30 tablet; Refill: 4  5. Mixed incontinence Urology referral when she desires  6. Need for prophylactic vaccination and inoculation against influenza

## 2014-01-22 ENCOUNTER — Other Ambulatory Visit: Payer: Self-pay | Admitting: Nurse Practitioner

## 2014-01-26 ENCOUNTER — Other Ambulatory Visit: Payer: Self-pay | Admitting: Internal Medicine

## 2014-02-05 ENCOUNTER — Other Ambulatory Visit: Payer: Self-pay | Admitting: Internal Medicine

## 2014-02-06 ENCOUNTER — Other Ambulatory Visit: Payer: Self-pay | Admitting: *Deleted

## 2014-02-06 MED ORDER — LOSARTAN POTASSIUM 50 MG PO TABS: ORAL_TABLET | ORAL | Status: DC

## 2014-02-06 NOTE — Telephone Encounter (Signed)
[facility] 

## 2014-02-17 ENCOUNTER — Encounter: Payer: Self-pay | Admitting: Internal Medicine

## 2014-02-17 ENCOUNTER — Ambulatory Visit (INDEPENDENT_AMBULATORY_CARE_PROVIDER_SITE_OTHER): Payer: 59 | Admitting: Internal Medicine

## 2014-02-17 VITALS — BP 130/80 | HR 76 | Temp 97.6°F | Ht 65.0 in | Wt 165.0 lb

## 2014-02-17 DIAGNOSIS — J449 Chronic obstructive pulmonary disease, unspecified: Secondary | ICD-10-CM

## 2014-02-17 DIAGNOSIS — R053 Chronic cough: Secondary | ICD-10-CM

## 2014-02-17 DIAGNOSIS — R002 Palpitations: Secondary | ICD-10-CM

## 2014-02-17 DIAGNOSIS — R05 Cough: Secondary | ICD-10-CM

## 2014-02-17 DIAGNOSIS — E039 Hypothyroidism, unspecified: Secondary | ICD-10-CM

## 2014-02-17 DIAGNOSIS — F17200 Nicotine dependence, unspecified, uncomplicated: Secondary | ICD-10-CM

## 2014-02-17 DIAGNOSIS — F40240 Claustrophobia: Secondary | ICD-10-CM

## 2014-02-17 DIAGNOSIS — Z720 Tobacco use: Secondary | ICD-10-CM

## 2014-02-17 DIAGNOSIS — B370 Candidal stomatitis: Secondary | ICD-10-CM | POA: Insufficient documentation

## 2014-02-17 DIAGNOSIS — I10 Essential (primary) hypertension: Secondary | ICD-10-CM

## 2014-02-17 DIAGNOSIS — N3941 Urge incontinence: Secondary | ICD-10-CM

## 2014-02-17 DIAGNOSIS — J4489 Other specified chronic obstructive pulmonary disease: Secondary | ICD-10-CM

## 2014-02-17 MED ORDER — FLUCONAZOLE 100 MG PO TABS: ORAL_TABLET | ORAL | Status: DC

## 2014-02-17 NOTE — Progress Notes (Signed)
Patient ID: Gabrielle White, female   DOB: [birth year], 68 y.o.   MRN: [id number]    Facility  [facility]     Place of Service:   OFFICE   Allergies  Allergen Reactions  . Cafergot   . Codeine   . Fenoprofen Calcium   . Nalfon [Fenoprofen]     Chief Complaint  Patient presents with  . Follow-up    HPI:  About 3 weeks ago, she came home from school and and fell asleep on the couch. couch. Then fed her animals. Called her friend, who then told her  She had called her earlier and told her the exact same thing.  No headache, fever, or chills or other illness.  Moniliasis of mouth: no physical burning from time to time. Difficult to drink acid beverages.  Chronic cough: persistent mainly dry cough  Claustrophobia:patient only took the bupropion for about a week. It didn't seem to be helping the claustrophobia or helping to curb smoking.  Chronic obstructive airway disease with asthma: continues with occasional chest rattle and wheeze  Palpitations: rare  Smoker: continues to smoke approximately one half pack per day. She is aware of the risk for continued respiratory problems as long as she is smoking.  Essential hypertension: controlled  Urge incontinence of urine: improved on Myrbetriq  Hypothyroidism, unspecified hypothyroidism type: needs follow-up in the future    Medications: Patient's Medications  New Prescriptions   No medications on file  Previous Medications   ALBUTEROL (VENTOLIN HFA) 108 (90 BASE) MCG/ACT INHALER    INHALE TWO PUFFS EVERY 4 HOURS AS NEEDED FOR COUGH OR SHORTNESS OF BREATH.   ALPRAZOLAM (XANAX) 0.5 MG TABLET    TAKE 1 TABLET BY MOUTH EVERY NIGHT AT BEDTIME AS NEEDED FOR SLEEP   ATORVASTATIN (LIPITOR) 40 MG TABLET    TAKE 1 TABLET EVERY DAY  TO  LOWER  CHOLESTEROL   CHOLECALCIFEROL (VITAMIN D3) 1000 UNITS CAPS    Take 1 capsule by mouth daily.     LEVOTHYROXINE (SYNTHROID, LEVOTHROID) 50 MCG TABLET    Take 1 tablet by mouth daily before breakfast for  thyroid supplement   LOSARTAN (COZAAR) 50 MG TABLET    Take one tablet by mouth once daily for blood pressure   MIRABEGRON ER (MYRBETRIQ) 25 MG TB24 TABLET    One daily to help bladder control   PREDNISONE (STERAPRED UNI-PAK) 5 MG TABS TABLET    Take 6 tablets by mouth on day 1, Take 5 tablets by mouth on day 2, Take 4 tablets by mouth on day 3, Take 3 tablets by mouth on day 4, Take 2 tablets by mouth on day 5, and take 1 tablets by mouth on day 6.   SERTRALINE (ZOLOFT) 100 MG TABLET    Take 1 tablet (100 mg total) by mouth daily.   SYMBICORT 160-4.5 MCG/ACT INHALER    INHALE 2 PUFFS INTO THE LUNGS EVERY 12 HOURS(MORNING AND THEN 12 HOURS LATER)   ZOSTER VACCINE LIVE, PF, (ZOSTAVAX) 81829 UNT/0.65ML INJECTION    Inject 19,400 Units into the skin once.  Modified Medications   No medications on file  Discontinued Medications   BUPROPION (WELLBUTRIN) 100 MG TABLET    One daily to help nerves   DOXYCYCLINE (VIBRA-TABS) 100 MG TABLET    Take one tablet by mouth twice daily for infection   PREDNISONE (STERAPRED UNI-PAK) 5 MG TABS TABLET    6 tablets day 1, 5 on day 2, 4 on day 3, 3 on day  4, 2 on day 5, and 1 on day 6.     Review of Systems  Constitutional: Positive for fatigue. Negative for fever, chills, diaphoresis, activity change, appetite change and unexpected weight change.  HENT: Negative.   Eyes: Negative.   Respiratory: Positive for cough, shortness of breath and wheezing.   Cardiovascular: Negative for chest pain, palpitations and leg swelling.  Gastrointestinal: Negative for nausea, vomiting, abdominal pain and abdominal distention.  Endocrine:       History hypothyroidism  Genitourinary:       Episodes of urinary incontinence. Improved on Myrbetriq.  Musculoskeletal: Negative for myalgias, back pain, arthralgias and gait problem.  Skin: Negative.   Neurological: Negative for dizziness, tremors, syncope, speech difficulty, weakness, light-headedness and headaches.    Psychiatric/Behavioral:       Feeling depressed sometimes.    Filed Vitals:   02/17/14 1659  BP: 130/80  Pulse: 76  Temp: 97.6 F (36.4 C)  TempSrc: Oral  Height: 5\' 5"  (1.651 m)  Weight: 165 lb (74.844 kg)  SpO2: 94%   Body mass index is 27.46 kg/(m^2).  Physical Exam  Constitutional: She is oriented to person, place, and time. She appears well-developed and well-nourished. No distress.  HENT:  Head: Normocephalic and atraumatic.  Right Ear: External ear normal.  Left Ear: External ear normal.  Nose: Nose normal.  Eyes: Conjunctivae and EOM are normal. Pupils are equal, round, and reactive to light. Left eye exhibits no discharge.  Corrective lenses.  Neck: Normal range of motion. Neck supple. No JVD present. No tracheal deviation present. No thyromegaly present.  Cardiovascular: Normal rate, regular rhythm, normal heart sounds and intact distal pulses.  Exam reveals no gallop and no friction rub.   No murmur heard. Pulmonary/Chest: Effort normal. No respiratory distress. She has wheezes. She has rales. She exhibits tenderness (left lower chest and to a lesser degree right lower chest).  Bilateral bronchial rattle. Partial clearing with cough. No wheezing. No dyspnea at rest and when talking.  Abdominal: Soft. Bowel sounds are normal. She exhibits no distension and no mass. There is no tenderness.  Musculoskeletal: Normal range of motion. She exhibits no edema or tenderness.  Lymphadenopathy:    She has no cervical adenopathy.  Neurological: She is alert and oriented to person, place, and time. No cranial nerve deficit. Coordination normal.  Skin: Skin is warm and dry. No rash noted. She is not diaphoretic. No erythema. No pallor.   lipoma of the right greater trochanter with some scarring around it secondary to previous removals. Small scar under right ribs from prior boil.  Psychiatric: She has a normal mood and affect. Her behavior is normal. Thought content normal.      Labs reviewed: Appointment on 12/10/2013  Component Date Value Ref Range Status  . TSH 12/10/2013 5.210* 0.450 - 4.500 uIU/mL Final  . Cholesterol, Total 12/10/2013 183  100 - 199 mg/dL Final  . Triglycerides 12/10/2013 133  0 - 149 mg/dL Final  . HDL 12/10/2013 54  >39 mg/dL Final   Comment: According to ATP-III Guidelines, HDL-C >59 mg/dL is considered a                          negative risk factor for CHD.  Marland Kitchen VLDL Cholesterol Cal 12/10/2013 27  5 - 40 mg/dL Final  . LDL Calculated 12/10/2013 102* 0 - 99 mg/dL Final  . Chol/HDL Ratio 12/10/2013 3.4  0.0 - 4.4 ratio units Final  Comment:                                   T. Chol/HDL Ratio                                                                      Men  Women                                                        1/2 Avg.Risk  3.4    3.3                                                            Avg.Risk  5.0    4.4                                                         2X Avg.Risk  9.6    7.1                                                         3X Avg.Risk 23.4   11.0  . Glucose 12/10/2013 92  65 - 99 mg/dL Final  . BUN 12/10/2013 11  8 - 27 mg/dL Final  . Creatinine, Ser 12/10/2013 0.78  0.57 - 1.00 mg/dL Final  . GFR calc non Af Amer 12/10/2013 78  >59 mL/min/1.73 Final  . GFR calc Af Amer 12/10/2013 90  >59 mL/min/1.73 Final  . BUN/Creatinine Ratio 12/10/2013 14  11 - 26 Final  . Sodium 12/10/2013 140  134 - 144 mmol/L Final  . Potassium 12/10/2013 4.8  3.5 - 5.2 mmol/L Final  . Chloride 12/10/2013 101  97 - 108 mmol/L Final  . CO2 12/10/2013 24  18 - 29 mmol/L Final  . Calcium 12/10/2013 9.7  8.7 - 10.3 mg/dL Final  . Total Protein 12/10/2013 6.4  6.0 - 8.5 g/dL Final  . Albumin 12/10/2013 4.1  3.6 - 4.8 g/dL Final  . Globulin, Total 12/10/2013 2.3  1.5 - 4.5 g/dL Final  . Albumin/Globulin Ratio 12/10/2013 1.8  1.1 - 2.5 Final  . Total Bilirubin 12/10/2013 0.2  0.0 - 1.2 mg/dL Final  . Alkaline  Phosphatase 12/10/2013 102  39 - 117 IU/L Final  . AST 12/10/2013 13  0 - 40 IU/L Final  . ALT 12/10/2013 14  0 - 32 IU/L Final     Assessment/Plan  1. Moniliasis of mouth - fluconazole (DIFLUCAN) 100 MG tablet; One  daily to treat oral thrush  Dispense: 3 tablet; Refill: 0  2. Chronic cough Advised to quit smoking  3. Claustrophobia Advised to resume bupropion and take it for at least 3-4 weeks prior to deciding if it is helping or not.  4. Chronic obstructive airway disease with asthma Advised to quit smoking . 5. Palpitations improved  6. Smoker Advised to quit smoking.  7. Essential hypertension controlled  8. Urge incontinence of urine Resume Myrbetriq  9. Hypothyroidism, unspecified hypothyroidism type -TSH, future

## 2014-02-23 ENCOUNTER — Telehealth: Payer: Self-pay | Admitting: Internal Medicine

## 2014-02-23 NOTE — Telephone Encounter (Signed)
Pt called this am, has a sore throat.  Suggested an appointment, pt declined, says she was just in the office.  Discussed with Ivin Booty, CMA.  Recommend urgent care. Called pt again to discuss problem with sore throat...[facility]

## 2014-02-27 ENCOUNTER — Other Ambulatory Visit: Payer: Self-pay | Admitting: Nurse Practitioner

## 2014-03-02 ENCOUNTER — Other Ambulatory Visit: Payer: Self-pay | Admitting: *Deleted

## 2014-03-02 MED ORDER — ALPRAZOLAM 0.5 MG PO TABS: 0.5000 mg | ORAL_TABLET | Freq: Every evening | ORAL | Status: DC | PRN

## 2014-03-02 NOTE — Telephone Encounter (Signed)
[facility] [address] 

## 2014-03-10 ENCOUNTER — Other Ambulatory Visit: Payer: Self-pay | Admitting: Internal Medicine

## 2014-03-16 ENCOUNTER — Other Ambulatory Visit: Payer: Self-pay | Admitting: *Deleted

## 2014-03-16 ENCOUNTER — Other Ambulatory Visit: Payer: Self-pay | Admitting: Internal Medicine

## 2014-03-16 MED ORDER — SERTRALINE HCL 100 MG PO TABS: ORAL_TABLET | ORAL | Status: DC

## 2014-03-16 NOTE — Telephone Encounter (Signed)
Patient called and requested 30 day supply until she can get her mail order

## 2014-04-05 ENCOUNTER — Other Ambulatory Visit: Payer: Self-pay | Admitting: Internal Medicine

## 2014-04-06 ENCOUNTER — Other Ambulatory Visit: Payer: Self-pay | Admitting: *Deleted

## 2014-04-06 MED ORDER — LEVOTHYROXINE SODIUM 50 MCG PO TABS: ORAL_TABLET | ORAL | Status: DC

## 2014-04-06 NOTE — Telephone Encounter (Signed)
Patient requested refill until she can get her mail order medication.

## 2014-04-17 ENCOUNTER — Other Ambulatory Visit: Payer: Self-pay | Admitting: *Deleted

## 2014-04-17 MED ORDER — LEVOTHYROXINE SODIUM 50 MCG PO TABS: ORAL_TABLET | ORAL | Status: DC

## 2014-04-17 NOTE — Telephone Encounter (Signed)
Patient requested to be faxed to [facility].  

## 2014-05-15 ENCOUNTER — Other Ambulatory Visit: Payer: Self-pay | Admitting: Nurse Practitioner

## 2014-06-01 ENCOUNTER — Other Ambulatory Visit: Payer: Self-pay | Admitting: Internal Medicine

## 2014-06-02 ENCOUNTER — Other Ambulatory Visit: Payer: Self-pay | Admitting: Internal Medicine

## 2014-06-08 ENCOUNTER — Other Ambulatory Visit: Payer: Self-pay | Admitting: *Deleted

## 2014-06-08 DIAGNOSIS — J441 Chronic obstructive pulmonary disease with (acute) exacerbation: Secondary | ICD-10-CM

## 2014-06-08 MED ORDER — ALBUTEROL SULFATE HFA 108 (90 BASE) MCG/ACT IN AERS: INHALATION_SPRAY | RESPIRATORY_TRACT | Status: DC

## 2014-06-08 NOTE — Telephone Encounter (Signed)
Patient Requested 

## 2014-06-09 ENCOUNTER — Other Ambulatory Visit: Payer: Self-pay | Admitting: *Deleted

## 2014-06-09 DIAGNOSIS — J441 Chronic obstructive pulmonary disease with (acute) exacerbation: Secondary | ICD-10-CM

## 2014-06-09 MED ORDER — ALBUTEROL SULFATE HFA 108 (90 BASE) MCG/ACT IN AERS: INHALATION_SPRAY | RESPIRATORY_TRACT | Status: DC

## 2014-06-09 NOTE — Telephone Encounter (Signed)
Patient requested to be refaxed because [facility] stated they never received. Refaxed.

## 2014-06-17 ENCOUNTER — Ambulatory Visit: Payer: Self-pay | Admitting: Internal Medicine

## 2014-07-07 ENCOUNTER — Ambulatory Visit (INDEPENDENT_AMBULATORY_CARE_PROVIDER_SITE_OTHER): Payer: Medicare PPO | Admitting: Internal Medicine

## 2014-07-07 ENCOUNTER — Encounter: Payer: Self-pay | Admitting: Internal Medicine

## 2014-07-07 ENCOUNTER — Other Ambulatory Visit: Payer: Self-pay | Admitting: *Deleted

## 2014-07-07 VITALS — BP 132/80 | HR 75 | Temp 98.5°F | Resp 18 | Ht 65.0 in | Wt 159.6 lb

## 2014-07-07 DIAGNOSIS — R5381 Other malaise: Secondary | ICD-10-CM | POA: Diagnosis not present

## 2014-07-07 DIAGNOSIS — E039 Hypothyroidism, unspecified: Secondary | ICD-10-CM | POA: Diagnosis not present

## 2014-07-07 DIAGNOSIS — Z720 Tobacco use: Secondary | ICD-10-CM | POA: Diagnosis not present

## 2014-07-07 DIAGNOSIS — M533 Sacrococcygeal disorders, not elsewhere classified: Secondary | ICD-10-CM

## 2014-07-07 DIAGNOSIS — R29818 Other symptoms and signs involving the nervous system: Secondary | ICD-10-CM | POA: Diagnosis not present

## 2014-07-07 DIAGNOSIS — R2689 Other abnormalities of gait and mobility: Secondary | ICD-10-CM | POA: Insufficient documentation

## 2014-07-07 DIAGNOSIS — J449 Chronic obstructive pulmonary disease, unspecified: Secondary | ICD-10-CM

## 2014-07-07 DIAGNOSIS — I10 Essential (primary) hypertension: Secondary | ICD-10-CM | POA: Diagnosis not present

## 2014-07-07 DIAGNOSIS — F17200 Nicotine dependence, unspecified, uncomplicated: Secondary | ICD-10-CM

## 2014-07-07 MED ORDER — PREDNISONE (PAK) 5 MG PO TABS: ORAL_TABLET | ORAL | Status: DC

## 2014-07-07 NOTE — Progress Notes (Signed)
Patient ID: Gabrielle White, female   DOB: 1945-10-29, 69 y.o.   MRN: [id number]    Facility  [facility]    Place of Service:   OFFICE   Allergies  Allergen Reactions  . Cafergot   . Codeine   . Fenoprofen Calcium   . Nalfon [Fenoprofen]     Chief Complaint  Patient presents with  . Medical Management of Chronic Issues    fell x 2wks ago,tailbone still hurts,numbness in left little toe, pain in diaphragm area.    HPI:  Essential hypertension - controlled  Chronic obstructive airway disease with asthma -improved. Less shortness of breath. She would like to keep on hand predniSONE (STERAPRED UNI-PAK) 5 MG TABS tablet for episodes where she gets short of breath since first wheeze.  Malaise: Says that she just generally doesn't feel very well. Seems depressed. Sometimes isolated[place] Does not have very many friends.  Smoker: Continues to smoke, but is cutting back. Claims that she does not inhale.  Coccygodynia: [place] about 2 weeks ago. Sustained bruising of her elbows. He she hit her head when she fell on the ice. These things have all resolved. She does have continued discomfort at her coccyx.  Balance problem: Feels off balance when walking. Denies true vertigo.  Hypothyroidism, unspecified hypothyroidism type -last lab showed a modest elevation in the TSH. She continues to take her levothyroxine.    Medications: Patient's Medications  New Prescriptions   No medications on file  Previous Medications   ALBUTEROL (VENTOLIN HFA) 108 (90 BASE) MCG/ACT INHALER    INHALE TWO PUFFS EVERY 4 HOURS AS NEEDED FOR COUGH OR SHORTNESS OF BREATH.   ALPRAZOLAM (XANAX) 0.5 MG TABLET    TAKE 1 TABLET BY MOUTH AT BEDTIME AS NEEDED FOR SLEEP   ATORVASTATIN (LIPITOR) 40 MG TABLET    TAKE 1 TABLET EVERY DAY  TO  LOWER  CHOLESTEROL   CHOLECALCIFEROL (VITAMIN D3) 1000 UNITS CAPS    Take 1 capsule by mouth daily.     FLUCONAZOLE (DIFLUCAN) 100 MG TABLET    One daily to  treat oral thrush   LEVOTHYROXINE (SYNTHROID, LEVOTHROID) 50 MCG TABLET    Take one tablet by mouth 30 minutes before breakfast for thyroid   LOSARTAN (COZAAR) 50 MG TABLET    Take one tablet by mouth once daily for blood pressure   PREDNISONE (STERAPRED UNI-PAK) 5 MG TABS TABLET    Take 6 tablets by mouth on day 1, Take 5 tablets by mouth on day 2, Take 4 tablets by mouth on day 3, Take 3 tablets by mouth on day 4, Take 2 tablets by mouth on day 5, and take 1 tablets by mouth on day 6.   SERTRALINE (ZOLOFT) 100 MG TABLET    TAKE 1 TABLET EVERY DAY  FOR  DEPRESSION  AND  ANXIETY   SYMBICORT 160-4.5 MCG/ACT INHALER    INHALE 2 PUFFS INTO THE LUNGS EVERY 12 HOURS(MORNING AND THEN 12 HOURS LATER)   ZOSTER VACCINE LIVE, PF, (ZOSTAVAX) 78295 UNT/0.65ML INJECTION    Inject 19,400 Units into the skin once.  Modified Medications   No medications on file  Discontinued Medications   BUPROPION (WELLBUTRIN) 100 MG TABLET       MIRABEGRON ER (MYRBETRIQ) 25 MG TB24 TABLET    One daily to help bladder control     Review of Systems  Constitutional: Positive for fatigue. Negative for fever, chills, diaphoresis, activity change, appetite change and unexpected  weight change.  HENT: Negative.   Eyes: Negative.   Respiratory: Positive for cough, shortness of breath and wheezing.   Cardiovascular: Negative for chest pain, palpitations and leg swelling.  Gastrointestinal: Negative for nausea, vomiting, abdominal pain and abdominal distention.  Endocrine:       History hypothyroidism  Genitourinary:       Episodes of urinary incontinence. Improved on Myrbetriq.  Musculoskeletal: Negative for myalgias, back pain, arthralgias and gait problem.  Skin: Negative.   Neurological: Negative for dizziness, tremors, syncope, speech difficulty, weakness, light-headedness and headaches.  Psychiatric/Behavioral:       Feeling depressed sometimes.    Filed Vitals:   07/07/14 1657  BP: 132/80  Pulse: 75  Temp: 98.5 F  (36.9 C)  TempSrc: Oral  Resp: 18  Height: 5\' 5"  (1.651 m)  Weight: 159 lb 9.6 oz (72.394 kg)  SpO2: 95%   Body mass index is 26.56 kg/(m^2).  Physical Exam  Constitutional: She is oriented to person, place, and time. She appears well-developed and well-nourished. No distress.  HENT:  Head: Normocephalic and atraumatic.  Right Ear: External ear normal.  Left Ear: External ear normal.  Nose: Nose normal.  Eyes: Conjunctivae and EOM are normal. Pupils are equal, round, and reactive to light. Left eye exhibits no discharge.  Corrective lenses.  Neck: Normal range of motion. Neck supple. No JVD present. No tracheal deviation present. No thyromegaly present.  Cardiovascular: Normal rate, regular rhythm, normal heart sounds and intact distal pulses.  Exam reveals no gallop and no friction rub.   No murmur heard. Pulmonary/Chest: Effort normal. No respiratory distress. She has wheezes. She has rales. She exhibits tenderness (left lower chest and to a lesser degree right lower chest).  Bilateral bronchial rattle. Partial clearing with cough. No wheezing. No dyspnea at rest and when talking.  Abdominal: Soft. Bowel sounds are normal. She exhibits no distension and no mass. There is no tenderness.  Musculoskeletal: Normal range of motion. She exhibits no edema or tenderness.  Tender at the tip of the coccyx.  Lymphadenopathy:    She has no cervical adenopathy.  Neurological: She is alert and oriented to person, place, and time. No cranial nerve deficit. Coordination normal.  Skin: Skin is warm and dry. No rash noted. She is not diaphoretic. No erythema. No pallor.   lipoma of the right greater trochanter with some scarring around it secondary to previous removals. Small scar under right ribs from prior boil.  Psychiatric: She has a normal mood and affect. Her behavior is normal. Thought content normal.     Labs reviewed: No visits with results within 3 Month(s) from this visit. Latest  known visit with results is:  Appointment on 12/10/2013  Component Date Value Ref Range Status  . TSH 12/10/2013 5.210* 0.450 - 4.500 uIU/mL Final  . Cholesterol, Total 12/10/2013 183  100 - 199 mg/dL Final  . Triglycerides 12/10/2013 133  0 - 149 mg/dL Final  . HDL 12/10/2013 54  >39 mg/dL Final   Comment: According to ATP-III Guidelines, HDL-C >59 mg/dL is considered a                          negative risk factor for CHD.  Marland Kitchen VLDL Cholesterol Cal 12/10/2013 27  5 - 40 mg/dL Final  . LDL Calculated 12/10/2013 102* 0 - 99 mg/dL Final  . Chol/HDL Ratio 12/10/2013 3.4  0.0 - 4.4 ratio units Final   Comment:  T. Chol/HDL Ratio                                                                      Men  Women                                                        1/2 Avg.Risk  3.4    3.3                                                            Avg.Risk  5.0    4.4                                                         2X Avg.Risk  9.6    7.1                                                         3X Avg.Risk 23.4   11.0  . Glucose 12/10/2013 92  65 - 99 mg/dL Final  . BUN 12/10/2013 11  8 - 27 mg/dL Final  . Creatinine, Ser 12/10/2013 0.78  0.57 - 1.00 mg/dL Final  . GFR calc non Af Amer 12/10/2013 78  >59 mL/min/1.73 Final  . GFR calc Af Amer 12/10/2013 90  >59 mL/min/1.73 Final  . BUN/Creatinine Ratio 12/10/2013 14  11 - 26 Final  . Sodium 12/10/2013 140  134 - 144 mmol/L Final  . Potassium 12/10/2013 4.8  3.5 - 5.2 mmol/L Final  . Chloride 12/10/2013 101  97 - 108 mmol/L Final  . CO2 12/10/2013 24  18 - 29 mmol/L Final  . Calcium 12/10/2013 9.7  8.7 - 10.3 mg/dL Final  . Total Protein 12/10/2013 6.4  6.0 - 8.5 g/dL Final  . Albumin 12/10/2013 4.1  3.6 - 4.8 g/dL Final  . Globulin, Total 12/10/2013 2.3  1.5 - 4.5 g/dL Final  . Albumin/Globulin Ratio 12/10/2013 1.8  1.1 - 2.5 Final  . Total Bilirubin 12/10/2013 0.2  0.0 - 1.2 mg/dL Final  . Alkaline  Phosphatase 12/10/2013 102  39 - 117 IU/L Final  . AST 12/10/2013 13  0 - 40 IU/L Final  . ALT 12/10/2013 14  0 - 32 IU/L Final     Assessment/Plan 1. Essential hypertension Continue losartan - Comprehensive metabolic panel; Future  2. Chronic obstructive airway disease with asthma Emphasized quitting tobacco - predniSONE (STERAPRED UNI-PAK) 5 MG TABS tablet; Take 6 tablets by mouth on day 1, Take 5 tablets by mouth on day 2, Take 4  tablets by mouth on day 3, Take 3 tablets by mouth on day 4, Take 2 tablets by mouth on day 5, and take 1 tablets by mouth on day 6.  Dispense: 21 tablet; Refill: 0  3. Malaise Likely linked to depression  4. Smoker Advised to stop smoking  5. Coccygodynia Recommended seat cushion with cut out area for coccyx  6. Balance problem no other neurologic abnormalities  7. Hypothyroidism, unspecified hypothyroidism type - TSH; Future

## 2014-07-22 ENCOUNTER — Other Ambulatory Visit: Payer: Self-pay | Admitting: Internal Medicine

## 2014-07-28 ENCOUNTER — Telehealth: Payer: Self-pay | Admitting: *Deleted

## 2014-07-28 NOTE — Telephone Encounter (Signed)
Patient called and left message that she was not feeling good, No energy, Feet Numb. Patient wanting bloodwork done to make sure something isn't wrong. Called patient back and left voice message that she needed to schedule an appointment to be seen first.

## 2014-07-29 ENCOUNTER — Other Ambulatory Visit: Payer: Self-pay | Admitting: Internal Medicine

## 2014-08-27 ENCOUNTER — Other Ambulatory Visit: Payer: Self-pay | Admitting: Internal Medicine

## 2014-08-28 ENCOUNTER — Other Ambulatory Visit: Payer: Self-pay | Admitting: Internal Medicine

## 2014-09-23 ENCOUNTER — Other Ambulatory Visit: Payer: Self-pay | Admitting: Internal Medicine

## 2014-09-28 ENCOUNTER — Other Ambulatory Visit: Payer: Self-pay

## 2014-09-30 ENCOUNTER — Other Ambulatory Visit: Payer: Self-pay | Admitting: Internal Medicine

## 2014-10-02 ENCOUNTER — Telehealth: Payer: Self-pay

## 2014-10-02 ENCOUNTER — Other Ambulatory Visit: Payer: Self-pay | Admitting: Internal Medicine

## 2014-10-02 MED ORDER — ATORVASTATIN CALCIUM 40 MG PO TABS: ORAL_TABLET | ORAL | Status: DC

## 2014-10-02 NOTE — Telephone Encounter (Signed)
Patient's Atorvastatin hasn't come in yet from [facility], could we call in #10 to [facility]

## 2014-10-21 ENCOUNTER — Other Ambulatory Visit: Payer: Self-pay | Admitting: *Deleted

## 2014-10-21 DIAGNOSIS — I10 Essential (primary) hypertension: Secondary | ICD-10-CM

## 2014-10-21 DIAGNOSIS — R5381 Other malaise: Secondary | ICD-10-CM

## 2014-10-21 DIAGNOSIS — F17200 Nicotine dependence, unspecified, uncomplicated: Secondary | ICD-10-CM

## 2014-10-21 DIAGNOSIS — E038 Other specified hypothyroidism: Secondary | ICD-10-CM

## 2014-10-26 ENCOUNTER — Other Ambulatory Visit: Payer: Medicare PPO

## 2014-10-26 DIAGNOSIS — F17200 Nicotine dependence, unspecified, uncomplicated: Secondary | ICD-10-CM

## 2014-10-26 DIAGNOSIS — I10 Essential (primary) hypertension: Secondary | ICD-10-CM

## 2014-10-26 DIAGNOSIS — E038 Other specified hypothyroidism: Secondary | ICD-10-CM

## 2014-10-26 DIAGNOSIS — E039 Hypothyroidism, unspecified: Secondary | ICD-10-CM

## 2014-10-26 DIAGNOSIS — R5381 Other malaise: Secondary | ICD-10-CM

## 2014-10-27 LAB — COMPREHENSIVE METABOLIC PANEL
ALBUMIN: 4.1 g/dL (ref 3.5–4.8)
ALT: 12 IU/L (ref 0–32)
AST: 13 IU/L (ref 0–40)
Albumin/Globulin Ratio: 1.7 (ref 1.1–2.5)
Alkaline Phosphatase: 110 IU/L (ref 39–117)
BILIRUBIN TOTAL: 0.4 mg/dL (ref 0.0–1.2)
BUN/Creatinine Ratio: 12 (ref 11–26)
BUN: 10 mg/dL (ref 8–27)
CALCIUM: 9.3 mg/dL (ref 8.7–10.3)
CO2: 24 mmol/L (ref 18–29)
Chloride: 100 mmol/L (ref 97–108)
Creatinine, Ser: 0.81 mg/dL (ref 0.57–1.00)
GFR calc Af Amer: 85 mL/min/{1.73_m2} (ref >59)
GFR, EST NON AFRICAN AMERICAN: 74 mL/min/{1.73_m2} (ref >59)
GLUCOSE: 96 mg/dL (ref 65–99)
Globulin, Total: 2.4 g/dL (ref 1.5–4.5)
Potassium: 4.2 mmol/L (ref 3.5–5.2)
SODIUM: 140 mmol/L (ref 134–144)
Total Protein: 6.5 g/dL (ref 6.0–8.5)

## 2014-10-27 LAB — CBC WITH DIFFERENTIAL/PLATELET
Basophils Absolute: 0.1 10*3/uL (ref 0.0–0.2)
Basos: 1 %
EOS (ABSOLUTE): 0.3 10*3/uL (ref 0.0–0.4)
EOS: 4 %
HEMATOCRIT: 46.5 % (ref 34.0–46.6)
Hemoglobin: 15.3 g/dL (ref 11.1–15.9)
Immature Grans (Abs): 0 10*3/uL (ref 0.0–0.1)
Immature Granulocytes: 0 %
Lymphocytes Absolute: 2.5 10*3/uL (ref 0.7–3.1)
Lymphs: 30 %
MCH: 31 pg (ref 26.6–33.0)
MCHC: 32.9 g/dL (ref 31.5–35.7)
MCV: 94 fL (ref 79–97)
MONOCYTES: 7 %
MONOS ABS: 0.6 10*3/uL (ref 0.1–0.9)
NEUTROS ABS: 5 10*3/uL (ref 1.4–7.0)
Neutrophils: 58 %
Platelets: 274 10*3/uL (ref 150–379)
RBC: 4.94 x10E6/uL (ref 3.77–5.28)
RDW: 13.9 % (ref 12.3–15.4)
WBC: 8.4 10*3/uL (ref 3.4–10.8)

## 2014-10-27 LAB — LIPID PANEL
Chol/HDL Ratio: 3.2 ratio units (ref 0.0–4.4)
Cholesterol, Total: 168 mg/dL (ref 100–199)
HDL: 52 mg/dL (ref >39)
LDL Calculated: 82 mg/dL (ref 0–99)
TRIGLYCERIDES: 171 mg/dL — AB (ref 0–149)
VLDL Cholesterol Cal: 34 mg/dL (ref 5–40)

## 2014-10-27 LAB — TSH: TSH: 7.74 u[IU]/mL — AB (ref 0.450–4.500)

## 2014-10-28 ENCOUNTER — Encounter: Payer: Self-pay | Admitting: Internal Medicine

## 2014-10-28 ENCOUNTER — Ambulatory Visit (INDEPENDENT_AMBULATORY_CARE_PROVIDER_SITE_OTHER): Payer: Medicare PPO | Admitting: Internal Medicine

## 2014-10-28 VITALS — BP 130/90 | HR 77 | Temp 98.1°F | Resp 20 | Ht 65.0 in | Wt 162.8 lb

## 2014-10-28 DIAGNOSIS — L02229 Furuncle of trunk, unspecified: Secondary | ICD-10-CM | POA: Diagnosis not present

## 2014-10-28 DIAGNOSIS — I10 Essential (primary) hypertension: Secondary | ICD-10-CM

## 2014-10-28 DIAGNOSIS — J449 Chronic obstructive pulmonary disease, unspecified: Secondary | ICD-10-CM | POA: Diagnosis not present

## 2014-10-28 DIAGNOSIS — B370 Candidal stomatitis: Secondary | ICD-10-CM

## 2014-10-28 DIAGNOSIS — F411 Generalized anxiety disorder: Secondary | ICD-10-CM

## 2014-10-28 DIAGNOSIS — E039 Hypothyroidism, unspecified: Secondary | ICD-10-CM | POA: Diagnosis not present

## 2014-10-28 DIAGNOSIS — R739 Hyperglycemia, unspecified: Secondary | ICD-10-CM | POA: Diagnosis not present

## 2014-10-28 DIAGNOSIS — Z720 Tobacco use: Secondary | ICD-10-CM | POA: Diagnosis not present

## 2014-10-28 DIAGNOSIS — F329 Major depressive disorder, single episode, unspecified: Secondary | ICD-10-CM | POA: Insufficient documentation

## 2014-10-28 DIAGNOSIS — F17200 Nicotine dependence, unspecified, uncomplicated: Secondary | ICD-10-CM

## 2014-10-28 DIAGNOSIS — E785 Hyperlipidemia, unspecified: Secondary | ICD-10-CM | POA: Diagnosis not present

## 2014-10-28 DIAGNOSIS — F32A Depression, unspecified: Secondary | ICD-10-CM

## 2014-10-28 DIAGNOSIS — J4489 Other specified chronic obstructive pulmonary disease: Secondary | ICD-10-CM

## 2014-10-28 MED ORDER — ATORVASTATIN CALCIUM 40 MG PO TABS: ORAL_TABLET | ORAL | Status: DC

## 2014-10-28 MED ORDER — ALPRAZOLAM 0.5 MG PO TABS: ORAL_TABLET | ORAL | Status: DC

## 2014-10-28 MED ORDER — LOSARTAN POTASSIUM 50 MG PO TABS: ORAL_TABLET | ORAL | Status: DC

## 2014-10-28 MED ORDER — CEPHALEXIN 500 MG PO CAPS: ORAL_CAPSULE | ORAL | Status: DC

## 2014-10-28 MED ORDER — BUDESONIDE-FORMOTEROL FUMARATE 160-4.5 MCG/ACT IN AERO: INHALATION_SPRAY | RESPIRATORY_TRACT | Status: DC

## 2014-10-28 MED ORDER — LEVOTHYROXINE SODIUM 75 MCG PO TABS: ORAL_TABLET | ORAL | Status: DC

## 2014-10-28 NOTE — Progress Notes (Signed)
Patient ID: Gabrielle White, female   DOB: 17-Oct-1945, 69 y.o.   MRN: [id number]    HISTORY AND PHYSICAL  Location:    [facility]   Place of Service:   OFFICE  Extended Emergency Contact Information Primary Emergency Contact: Zacarias,Ian Address: [address]          [place], [place] [address] [place] Phone: [phone] Relation: Son  Ambulance person Complaint  Patient presents with  . Annual Exam    Annual exam    HPI:  Feet and toes freeze in one position sometimes. Occurs about 2-3 times weekly. Lasts about 5 min. No pain. Started about 6-7 months ago.  Hoarse. Worries about throat cancer since she still smokes. Smoking more since a friend died about 3 weeks ago. Died of heart attack.  Very depressed. Sertraline is not working. Cries frequently.  Hurts in the upper abdomen. No obvious factors to relieve or initiate pain.   Alprazolam only works about half the time. She uses another 1/2 which will usually help. Wants too be relaxed enough to go to sleep.  Has episodes of nervousness and shaking. Occurs up to a couple of times per month.  Memory is going. Forgets appointments. Forgets what she is hunting for when she goes in a room. Forgets what she tells people.  Boil under the right breast for a week. Painful, inflamed.  Has eye appt next week.   To get bone density in Oct 2016 at [facility].    Past Medical History  Diagnosis Date  . Emphysema   . Hyperlipidemia   . Leukocytosis   . Tobacco abuse   . Depressive disorder   . HTN (hypertension)   . Nonspecific (abnormal) findings on radiological and other examination of skull and head   . Enthesopathy of ankle and tarsus, unspecified   . Hypopotassemia   . Other specified disease of white blood cells   . Chronic airway obstruction, not elsewhere classified   . Palpitations   . Other emphysema   . Osteoarthrosis, unspecified whether generalized or  localized, unspecified site   . Depressive disorder, not elsewhere classified   . Migraine, unspecified, without mention of intractable migraine without mention of status migrainosus     Past Surgical History  Procedure Laterality Date  . Cesarean section    . Colonoscopy  08/14/2008    Dr.. Delfin Edis    Patient Care Team: Estill Dooms, MD as PCP - General (Internal Medicine)  History   Social History  . Marital Status: Divorced    Spouse Name: N/A  . Number of Children: N/A  . Years of Education: N/A   Occupational History  . retired Pharmacist, hospital    Social History Main Topics  . Smoking status: Current Every Day Smoker -- 1.00 packs/day for 28 years    Types: Cigarettes  . Smokeless tobacco: Not on file  . Alcohol Use: Yes     Comment: occ  . Drug Use: No  . Sexual Activity: Not on file   Other Topics Concern  . Not on file   Social History Narrative     reports that she has been smoking Cigarettes.  She has a 28 pack-year smoking history. She does not have any smokeless tobacco history on file. She reports that she drinks alcohol. She reports that she does not use illicit drugs.  Family History  Problem Relation Age of Onset  . Alzheimer's disease Father   .  Heart disease Father   . Diabetes Father   . Skin cancer Father   . Heart disease Mother   . Breast cancer Mother    Family Status  Relation Status Death Age  . Father Deceased     Alzheimer's disease  . Mother Deceased     Natural causes  . Brother Alive   . Son Alive     Immunization History  Administered Date(s) Administered  . DTaP 04/04/2004  . Influenza Split 01/01/2013  . Influenza,inj,Quad PF,36+ Mos 01/08/2013, 12/17/2013  . Pneumococcal Polysaccharide-23 04/11/2011  . Td 04/04/2004  . Zoster 01/12/2014    Allergies  Allergen Reactions  . Cafergot   . Codeine   . Fenoprofen Calcium   . Nalfon [Fenoprofen]     Medications: Patient's Medications  New Prescriptions   No  medications on file  Previous Medications   ALBUTEROL (VENTOLIN HFA) 108 (90 BASE) MCG/ACT INHALER    INHALE TWO PUFFS EVERY 4 HOURS AS NEEDED FOR COUGH OR SHORTNESS OF BREATH.   ALPRAZOLAM (XANAX) 0.5 MG TABLET    TAKE 1 TABLET BY MOUTH EVERY NIGHT AT BEDTIME AS NEEDED FOR SLEEP   ATORVASTATIN (LIPITOR) 40 MG TABLET    Take one tablet daily for cholesterol   CHOLECALCIFEROL (VITAMIN D3) 1000 UNITS CAPS    Take 1 capsule by mouth daily.     FLUCONAZOLE (DIFLUCAN) 100 MG TABLET    One daily to treat oral thrush   LEVOTHYROXINE (SYNTHROID, LEVOTHROID) 50 MCG TABLET    Take one tablet by mouth 30 minutes before breakfast for thyroid   LOSARTAN (COZAAR) 50 MG TABLET    Take one tablet by mouth once daily for blood pressure   PREDNISONE (STERAPRED UNI-PAK) 5 MG TABS TABLET    Take 6 tablets by mouth on day 1, Take 5 tablets by mouth on day 2, Take 4 tablets by mouth on day 3, Take 3 tablets by mouth on day 4, Take 2 tablets by mouth on day 5, and take 1 tablets by mouth on day 6.   SERTRALINE (ZOLOFT) 100 MG TABLET    TAKE 1 TABLET EVERY DAY  FOR  DEPRESSION  AND  ANXIETY   SYMBICORT 160-4.5 MCG/ACT INHALER    INHALE 2 PUFFS INTO THE LUNGS EVERY 12 HOURS(MORNING AND THEN 12 HOURS LATER)   ZOSTER VACCINE LIVE, PF, (ZOSTAVAX) 16109 UNT/0.65ML INJECTION    Inject 19,400 Units into the skin once.  Modified Medications   No medications on file  Discontinued Medications   No medications on file    Review of Systems  Constitutional: Positive for fatigue. Negative for fever, chills, diaphoresis, activity change, appetite change and unexpected weight change.  HENT: Positive for sore throat.   Eyes: Negative.   Respiratory: Positive for cough, shortness of breath and wheezing.   Cardiovascular: Negative for chest pain, palpitations and leg swelling.  Gastrointestinal: Negative for nausea, vomiting, abdominal pain and abdominal distention.  Endocrine:       History hypothyroidism  Genitourinary:        Episodes of urinary incontinence. Improved on Myrbetriq.  Musculoskeletal: Negative for myalgias, back pain, arthralgias and gait problem.  Skin: Negative.   Neurological: Negative for dizziness, tremors, syncope, speech difficulty, weakness, light-headedness and headaches.  Psychiatric/Behavioral:       Feeling depressed sometimes.    Filed Vitals:   10/28/14 1322 10/28/14 1327  BP: 138/100 130/90  Pulse: 77   Temp: 98.1 F (36.7 C)   TempSrc: Oral   Resp:  20   Height: 5\' 5"  (1.651 m)   Weight: 162 lb 12.8 oz (73.846 kg)   SpO2: 95%    Body mass index is 27.09 kg/(m^2).  Physical Exam  Constitutional: She is oriented to person, place, and time. She appears well-developed and well-nourished. No distress.  HENT:  Head: Normocephalic and atraumatic.  Right Ear: External ear normal.  Left Ear: External ear normal.  Nose: Nose normal.  3 yeast plaques on the uvula and soft palate  Eyes: Conjunctivae and EOM are normal. Pupils are equal, round, and reactive to light. Left eye exhibits no discharge.  Corrective lenses.  Neck: Normal range of motion. Neck supple. No JVD present. No tracheal deviation present. No thyromegaly present.  Cardiovascular: Normal rate, regular rhythm, normal heart sounds and intact distal pulses.  Exam reveals no gallop and no friction rub.   No murmur heard. Pulmonary/Chest: Effort normal. No respiratory distress. She has wheezes. She has rales. She exhibits tenderness (left lower chest and to a lesser degree right lower chest).  Bilateral bronchial rattle. Partial clearing with cough. No wheezing. No dyspnea at rest and when talking.  Abdominal: Soft. Bowel sounds are normal. She exhibits no distension and no mass. There is no tenderness.  Genitourinary:  Went to [facility].  Musculoskeletal: Normal range of motion. She exhibits no edema or tenderness.  Tender at the tip of the coccyx.  Lymphadenopathy:    She has no cervical adenopathy.    Neurological: She is alert and oriented to person, place, and time. No cranial nerve deficit. Coordination normal.  Skin: Skin is warm and dry. No rash noted. She is not diaphoretic. No erythema. No pallor.   lipoma of the right greater trochanter with some scarring around it secondary to previous removals. Small scar under right ribs from prior boil.  Psychiatric: She has a normal mood and affect. Her behavior is normal. Thought content normal.     Labs reviewed: Appointment on 10/26/2014  Component Date Value Ref Range Status  . TSH 10/26/2014 7.740* 0.450 - 4.500 uIU/mL Final  . Glucose 10/26/2014 96  65 - 99 mg/dL Final  . BUN 10/26/2014 10  8 - 27 mg/dL Final  . Creatinine, Ser 10/26/2014 0.81  0.57 - 1.00 mg/dL Final  . GFR calc non Af Amer 10/26/2014 74  >59 mL/min/1.73 Final  . GFR calc Af Amer 10/26/2014 85  >59 mL/min/1.73 Final  . BUN/Creatinine Ratio 10/26/2014 12  11 - 26 Final  . Sodium 10/26/2014 140  134 - 144 mmol/L Final  . Potassium 10/26/2014 4.2  3.5 - 5.2 mmol/L Final  . Chloride 10/26/2014 100  97 - 108 mmol/L Final  . CO2 10/26/2014 24  18 - 29 mmol/L Final  . Calcium 10/26/2014 9.3  8.7 - 10.3 mg/dL Final  . Total Protein 10/26/2014 6.5  6.0 - 8.5 g/dL Final  . Albumin 10/26/2014 4.1  3.5 - 4.8 g/dL Final  . Globulin, Total 10/26/2014 2.4  1.5 - 4.5 g/dL Final  . Albumin/Globulin Ratio 10/26/2014 1.7  1.1 - 2.5 Final  . Bilirubin Total 10/26/2014 0.4  0.0 - 1.2 mg/dL Final  . Alkaline Phosphatase 10/26/2014 110  39 - 117 IU/L Final  . AST 10/26/2014 13  0 - 40 IU/L Final  . ALT 10/26/2014 12  0 - 32 IU/L Final  . Cholesterol, Total 10/26/2014 168  100 - 199 mg/dL Final  . Triglycerides 10/26/2014 171* 0 - 149 mg/dL Final  . HDL 10/26/2014 52  >  39 mg/dL Final   Comment: According to ATP-III Guidelines, HDL-C >59 mg/dL is considered a negative risk factor for CHD.   Marland Kitchen VLDL Cholesterol Cal 10/26/2014 34  5 - 40 mg/dL Final  . LDL Calculated 10/26/2014 82   0 - 99 mg/dL Final  . Chol/HDL Ratio 10/26/2014 3.2  0.0 - 4.4 ratio units Final   Comment:                                   T. Chol/HDL Ratio                                             Men  Women                               1/2 Avg.Risk  3.4    3.3                                   Avg.Risk  5.0    4.4                                2X Avg.Risk  9.6    7.1                                3X Avg.Risk 23.4   11.0   . WBC 10/26/2014 8.4  3.4 - 10.8 x10E3/uL Final  . RBC 10/26/2014 4.94  3.77 - 5.28 x10E6/uL Final  . Hemoglobin 10/26/2014 15.3  11.1 - 15.9 g/dL Final  . Hematocrit 10/26/2014 46.5  34.0 - 46.6 % Final  . MCV 10/26/2014 94  79 - 97 fL Final  . MCH 10/26/2014 31.0  26.6 - 33.0 pg Final  . MCHC 10/26/2014 32.9  31.5 - 35.7 g/dL Final  . RDW 10/26/2014 13.9  12.3 - 15.4 % Final  . Platelets 10/26/2014 274  150 - 379 x10E3/uL Final  . Neutrophils 10/26/2014 58   Final  . Lymphs 10/26/2014 30   Final  . Monocytes 10/26/2014 7   Final  . Eos 10/26/2014 4   Final  . Basos 10/26/2014 1   Final  . Neutrophils Absolute 10/26/2014 5.0  1.4 - 7.0 x10E3/uL Final  . Lymphocytes Absolute 10/26/2014 2.5  0.7 - 3.1 x10E3/uL Final  . Monocytes Absolute 10/26/2014 0.6  0.1 - 0.9 x10E3/uL Final  . EOS (ABSOLUTE) 10/26/2014 0.3  0.0 - 0.4 x10E3/uL Final  . Basophils Absolute 10/26/2014 0.1  0.0 - 0.2 x10E3/uL Final  . Immature Granulocytes 10/26/2014 0   Final  . Immature Grans (Abs) 10/26/2014 0.0  0.0 - 0.1 x10E3/uL Final    No results found.   Assessment/Plan  1. Boil of trunk - cephALEXin (KEFLEX) 500 MG capsule; Take one 3 times daily for infection  Dispense: 21 capsule; Refill: 1  2. Essential hypertension - losartan (COZAAR) 50 MG tablet; Take one tablet by mouth once daily for blood pressure  Dispense: 90 tablet; Refill: 3  3. Anxiety state Likely associated with depression - Add Fetzima and titrate to 40 mg  daily - ALPRAZolam (XANAX) 0.5 MG tablet; One up to twice  daily for nervousness or sleep  Dispense: 180 tablet; Refill: 2  4. Chronic obstructive airway disease with asthma -Sample Anoro: 1 inhalation every 24 hourso help breathing  Dispense: 10.2 g; Refill: 5  - budesonide-formoterol (SYMBICORT) 160-4.5 MCG/ACT inhaler; INHALE 2 PUFFS INTO THE LUNGS EVERY 12 HOURS(MORNING AND THEN 12 HOURS LATER) <hold while using Anoro>  5. Hypothyroidism, unspecified hypothyroidism type - levothyroxine (SYNTHROID, LEVOTHROID) 75 MCG tablet; One daily for thyroid supplement  Dispense: 90 tablet; Refill: 3  6. Smoker STOP  7. Hyperglycemia Controlled  8. Hyperlipidemia - atorvastatin (LIPITOR) 40 MG tablet; Take one tablet daily for cholesterol  Dispense: 90 tablet; Refill: 1  9. Oral moniliasis -Diflucan 100 mg, 1 tablet daily for 3 days.

## 2014-10-28 NOTE — Progress Notes (Deleted)
Patient ID: Gabrielle White, female   DOB: [birth year], 69 y.o.   MRN: [id number]    Facility  [facility]    Place of Service:   OFFICE    Allergies  Allergen Reactions  . Cafergot   . Codeine   . Fenoprofen Calcium   . Nalfon [Fenoprofen]     Chief Complaint  Patient presents with  . Annual Exam    Annual exam    HPI:  ***  Medications: Patient's Medications  New Prescriptions   No medications on file  Previous Medications   ALBUTEROL (VENTOLIN HFA) 108 (90 BASE) MCG/ACT INHALER    INHALE TWO PUFFS EVERY 4 HOURS AS NEEDED FOR COUGH OR SHORTNESS OF BREATH.   ALPRAZOLAM (XANAX) 0.5 MG TABLET    TAKE 1 TABLET BY MOUTH EVERY NIGHT AT BEDTIME AS NEEDED FOR SLEEP   ATORVASTATIN (LIPITOR) 40 MG TABLET    Take one tablet daily for cholesterol   CHOLECALCIFEROL (VITAMIN D3) 1000 UNITS CAPS    Take 1 capsule by mouth daily.     FLUCONAZOLE (DIFLUCAN) 100 MG TABLET    One daily to treat oral thrush   LEVOTHYROXINE (SYNTHROID, LEVOTHROID) 50 MCG TABLET    Take one tablet by mouth 30 minutes before breakfast for thyroid   LOSARTAN (COZAAR) 50 MG TABLET    Take one tablet by mouth once daily for blood pressure   PREDNISONE (STERAPRED UNI-PAK) 5 MG TABS TABLET    Take 6 tablets by mouth on day 1, Take 5 tablets by mouth on day 2, Take 4 tablets by mouth on day 3, Take 3 tablets by mouth on day 4, Take 2 tablets by mouth on day 5, and take 1 tablets by mouth on day 6.   SERTRALINE (ZOLOFT) 100 MG TABLET    TAKE 1 TABLET EVERY DAY  FOR  DEPRESSION  AND  ANXIETY   SYMBICORT 160-4.5 MCG/ACT INHALER    INHALE 2 PUFFS INTO THE LUNGS EVERY 12 HOURS(MORNING AND THEN 12 HOURS LATER)   ZOSTER VACCINE LIVE, PF, (ZOSTAVAX) 54270 UNT/0.65ML INJECTION    Inject 19,400 Units into the skin once.  Modified Medications   No medications on file  Discontinued Medications   No medications on file     Review of Systems  Filed Vitals:   10/28/14 1322 10/28/14 1327  BP: 138/100 130/90  Pulse: 77   Temp: 98.1  F (36.7 C)   TempSrc: Oral   Resp: 20   Height: 5\' 5"  (1.651 m)   Weight: 162 lb 12.8 oz (73.846 kg)   SpO2: 95%    Body mass index is 27.09 kg/(m^2).  Physical Exam   Labs reviewed: Appointment on 10/26/2014  Component Date Value Ref Range Status  . TSH 10/26/2014 7.740* 0.450 - 4.500 uIU/mL Final  . Glucose 10/26/2014 96  65 - 99 mg/dL Final  . BUN 10/26/2014 10  8 - 27 mg/dL Final  . Creatinine, Ser 10/26/2014 0.81  0.57 - 1.00 mg/dL Final  . GFR calc non Af Amer 10/26/2014 74  >59 mL/min/1.73 Final  . GFR calc Af Amer 10/26/2014 85  >59 mL/min/1.73 Final  . BUN/Creatinine Ratio 10/26/2014 12  11 - 26 Final  . Sodium 10/26/2014 140  134 - 144 mmol/L Final  . Potassium 10/26/2014 4.2  3.5 - 5.2 mmol/L Final  . Chloride 10/26/2014 100  97 - 108 mmol/L Final  . CO2 10/26/2014 24  18 - 29 mmol/L Final  . Calcium 10/26/2014 9.3  8.7 -  10.3 mg/dL Final  . Total Protein 10/26/2014 6.5  6.0 - 8.5 g/dL Final  . Albumin 10/26/2014 4.1  3.5 - 4.8 g/dL Final  . Globulin, Total 10/26/2014 2.4  1.5 - 4.5 g/dL Final  . Albumin/Globulin Ratio 10/26/2014 1.7  1.1 - 2.5 Final  . Bilirubin Total 10/26/2014 0.4  0.0 - 1.2 mg/dL Final  . Alkaline Phosphatase 10/26/2014 110  39 - 117 IU/L Final  . AST 10/26/2014 13  0 - 40 IU/L Final  . ALT 10/26/2014 12  0 - 32 IU/L Final  . Cholesterol, Total 10/26/2014 168  100 - 199 mg/dL Final  . Triglycerides 10/26/2014 171* 0 - 149 mg/dL Final  . HDL 10/26/2014 52  >39 mg/dL Final   Comment: According to ATP-III Guidelines, HDL-C >59 mg/dL is considered a negative risk factor for CHD.   Marland Kitchen VLDL Cholesterol Cal 10/26/2014 34  5 - 40 mg/dL Final  . LDL Calculated 10/26/2014 82  0 - 99 mg/dL Final  . Chol/HDL Ratio 10/26/2014 3.2  0.0 - 4.4 ratio units Final   Comment:                                   T. Chol/HDL Ratio                                             Men  Women                               1/2 Avg.Risk  3.4    3.3                                    Avg.Risk  5.0    4.4                                2X Avg.Risk  9.6    7.1                                3X Avg.Risk 23.4   11.0   . WBC 10/26/2014 8.4  3.4 - 10.8 x10E3/uL Final  . RBC 10/26/2014 4.94  3.77 - 5.28 x10E6/uL Final  . Hemoglobin 10/26/2014 15.3  11.1 - 15.9 g/dL Final  . Hematocrit 10/26/2014 46.5  34.0 - 46.6 % Final  . MCV 10/26/2014 94  79 - 97 fL Final  . MCH 10/26/2014 31.0  26.6 - 33.0 pg Final  . MCHC 10/26/2014 32.9  31.5 - 35.7 g/dL Final  . RDW 10/26/2014 13.9  12.3 - 15.4 % Final  . Platelets 10/26/2014 274  150 - 379 x10E3/uL Final  . Neutrophils 10/26/2014 58   Final  . Lymphs 10/26/2014 30   Final  . Monocytes 10/26/2014 7   Final  . Eos 10/26/2014 4   Final  . Basos 10/26/2014 1   Final  . Neutrophils Absolute 10/26/2014 5.0  1.4 - 7.0 x10E3/uL Final  . Lymphocytes Absolute 10/26/2014 2.5  0.7 - 3.1 x10E3/uL Final  . Monocytes Absolute 10/26/2014 0.6  0.1 - 0.9  x10E3/uL Final  . EOS (ABSOLUTE) 10/26/2014 0.3  0.0 - 0.4 x10E3/uL Final  . Basophils Absolute 10/26/2014 0.1  0.0 - 0.2 x10E3/uL Final  . Immature Granulocytes 10/26/2014 0   Final  . Immature Grans (Abs) 10/26/2014 0.0  0.0 - 0.1 x10E3/uL Final     Assessment/Plan

## 2014-10-28 NOTE — Patient Instructions (Addendum)
Do not use the Symbicort while using Anoro. Stop Sertraline.  Alternate days of levothyroxine with 2 tablets and then one tablet until the 50 mcg tablets are used up.

## 2014-10-29 ENCOUNTER — Telehealth: Payer: Self-pay | Admitting: *Deleted

## 2014-10-29 MED ORDER — FLUCONAZOLE 100 MG PO TABS: ORAL_TABLET | ORAL | Status: DC

## 2014-10-29 NOTE — Telephone Encounter (Signed)
I finished her note today. I went ahead and sent the prescription and to her mail-in pharmacy. You can notify her that she should be getting the prescription through the mail soon.

## 2014-10-29 NOTE — Telephone Encounter (Signed)
Patient called and stated that she was seen yesterday and you were going to call in Fluconazole for oral thrush, but its not at the pharmacy. I have reviewed the chart note for yesterday and there is no mention of oral thrush from patient. Please Advise.

## 2014-10-29 NOTE — Telephone Encounter (Signed)
Patient notified and agreed.  

## 2014-11-24 ENCOUNTER — Encounter: Payer: Self-pay | Admitting: Internal Medicine

## 2014-11-24 ENCOUNTER — Ambulatory Visit (INDEPENDENT_AMBULATORY_CARE_PROVIDER_SITE_OTHER): Payer: Medicare PPO | Admitting: Internal Medicine

## 2014-11-24 VITALS — BP 122/82 | HR 83 | Temp 97.7°F | Resp 20 | Ht 65.0 in | Wt 162.8 lb

## 2014-11-24 DIAGNOSIS — R053 Chronic cough: Secondary | ICD-10-CM

## 2014-11-24 DIAGNOSIS — R05 Cough: Secondary | ICD-10-CM | POA: Diagnosis not present

## 2014-11-24 DIAGNOSIS — F32A Depression, unspecified: Secondary | ICD-10-CM

## 2014-11-24 DIAGNOSIS — M79645 Pain in left finger(s): Secondary | ICD-10-CM | POA: Diagnosis not present

## 2014-11-24 DIAGNOSIS — I10 Essential (primary) hypertension: Secondary | ICD-10-CM | POA: Diagnosis not present

## 2014-11-24 DIAGNOSIS — Z1231 Encounter for screening mammogram for malignant neoplasm of breast: Secondary | ICD-10-CM | POA: Insufficient documentation

## 2014-11-24 DIAGNOSIS — R29818 Other symptoms and signs involving the nervous system: Secondary | ICD-10-CM

## 2014-11-24 DIAGNOSIS — J449 Chronic obstructive pulmonary disease, unspecified: Secondary | ICD-10-CM

## 2014-11-24 DIAGNOSIS — R2689 Other abnormalities of gait and mobility: Secondary | ICD-10-CM

## 2014-11-24 DIAGNOSIS — F329 Major depressive disorder, single episode, unspecified: Secondary | ICD-10-CM

## 2014-11-24 DIAGNOSIS — B370 Candidal stomatitis: Secondary | ICD-10-CM | POA: Diagnosis not present

## 2014-11-24 MED ORDER — ANORO ELLIPTA 62.5-25 MCG/INH IN AEPB: INHALATION_SPRAY | RESPIRATORY_TRACT | Status: DC

## 2014-11-24 NOTE — Progress Notes (Signed)
Patient ID: Gabrielle White, female   DOB: [birth year], 69 y.o.   MRN: [id number]    Facility  [facility]    Place of Service:   OFFICE    Allergies  Allergen Reactions  . Cafergot   . Codeine   . Fenoprofen Calcium   . Nalfon [Fenoprofen]     Chief Complaint  Patient presents with  . Medical Management of Chronic Issues    4 week follow-up    HPI:  Depression - patient was unable to tolerate that smoke. Made her feel "weird". She felt like her memory got worse. She did not feel any better on this medication. Continues to have problems with early awakening at 2 AM.  Essential hypertension - controlled  Moniliasis of mouth - improved  Pain in finger of left hand - improved  Balance problem - continues to feel off balance. Has not fallen, but tripped over an electric  Chronic obstructive airway disease with asthma - continues to have problems with breathing related to animal dander, smoking, and possible grass allergies. Anoro did seem to help.    Medications: Patient's Medications  New Prescriptions   No medications on file  Previous Medications   ALBUTEROL (VENTOLIN HFA) 108 (90 BASE) MCG/ACT INHALER    INHALE TWO PUFFS EVERY 4 HOURS AS NEEDED FOR COUGH OR SHORTNESS OF BREATH.   ALPRAZOLAM (XANAX) 0.5 MG TABLET    One up to twice daily for nervousness or sleep   ANORO ELLIPTA 62.5-25 MCG/INH AEPB    INL 1 PUFF D TO HELP WITH BREATHING   ATORVASTATIN (LIPITOR) 40 MG TABLET    Take one tablet daily for cholesterol   BUDESONIDE-FORMOTEROL (SYMBICORT) 160-4.5 MCG/ACT INHALER    INHALE 2 PUFFS INTO THE LUNGS EVERY 12 HOURS(MORNING AND THEN 12 HOURS LATER) to help breathing   CHOLECALCIFEROL (VITAMIN D3) 1000 UNITS CAPS    Take 1 capsule by mouth daily.     FLUCONAZOLE (DIFLUCAN) 100 MG TABLET    One daily to treat oral thrush   LEVOTHYROXINE (SYNTHROID, LEVOTHROID) 75 MCG TABLET    One daily for thyroid supplement   LOSARTAN (COZAAR) 50 MG TABLET    Take one tablet by mouth once  daily for blood pressure   PREDNISONE (STERAPRED UNI-PAK) 5 MG TABS TABLET    Take 6 tablets by mouth on day 1, Take 5 tablets by mouth on day 2, Take 4 tablets by mouth on day 3, Take 3 tablets by mouth on day 4, Take 2 tablets by mouth on day 5, and take 1 tablets by mouth on day 6.  Modified Medications   No medications on file  Discontinued Medications   CEPHALEXIN (KEFLEX) 500 MG CAPSULE    Take one 3 times daily for infection     Review of Systems  Constitutional: Positive for fatigue. Negative for fever, chills, diaphoresis, activity change, appetite change and unexpected weight change.  HENT: Negative for congestion, ear discharge, ear pain, hearing loss, postnasal drip, rhinorrhea, sore throat, tinnitus, trouble swallowing and voice change.   Eyes: Negative.  Negative for pain, redness, itching and visual disturbance.  Respiratory: Positive for cough, shortness of breath and wheezing. Negative for choking.   Cardiovascular: Negative for chest pain, palpitations and leg swelling.  Gastrointestinal: Negative for nausea, vomiting, abdominal pain, diarrhea, constipation and abdominal distention.  Endocrine: Negative for cold intolerance, heat intolerance, polydipsia, polyphagia and polyuria.       History hypothyroidism  Genitourinary: Negative for dysuria, urgency, frequency, hematuria, flank  pain, vaginal discharge, difficulty urinating and pelvic pain.       Episodes of urinary incontinence. Improved on Myrbetriq.  Musculoskeletal: Negative for myalgias, back pain, arthralgias, gait problem, neck pain and neck stiffness.  Skin: Negative for color change, pallor and rash.  Allergic/Immunologic: Negative.   Neurological: Positive for dizziness. Negative for tremors, seizures, syncope, speech difficulty, weakness, light-headedness, numbness and headaches.  Hematological: Negative for adenopathy. Does not bruise/bleed easily.  Psychiatric/Behavioral: Positive for decreased concentration.  Negative for suicidal ideas, hallucinations, behavioral problems, confusion, sleep disturbance, dysphoric mood and agitation. The patient is not nervous/anxious and is not hyperactive.        Feeling depressed sometimes.    Filed Vitals:   11/24/14 1457  BP: 122/82  Pulse: 83  Temp: 97.7 F (36.5 C)  TempSrc: Oral  Resp: 20  Height: 5\' 5"  (1.651 m)  Weight: 162 lb 12.8 oz (73.846 kg)  SpO2: 95%   Body mass index is 27.09 kg/(m^2).  Physical Exam  Constitutional: She is oriented to person, place, and time. She appears well-developed and well-nourished. No distress.  HENT:  Head: Normocephalic and atraumatic.  Right Ear: External ear normal.  Left Ear: External ear normal.  Nose: Nose normal.  3 yeast plaques on the uvula and soft palate  Eyes: Conjunctivae and EOM are normal. Pupils are equal, round, and reactive to light. Left eye exhibits no discharge.  Corrective lenses.  Neck: Normal range of motion. Neck supple. No JVD present. No tracheal deviation present. No thyromegaly present.  Cardiovascular: Normal rate, regular rhythm, normal heart sounds and intact distal pulses.  Exam reveals no gallop and no friction rub.   No murmur heard. Pulmonary/Chest: Effort normal. No respiratory distress. She has wheezes. She has rales. She exhibits tenderness (left lower chest and to a lesser degree right lower chest).  Bilateral bronchial rattle. Partial clearing with cough. No wheezing. No dyspnea at rest and when talking.  Abdominal: Soft. Bowel sounds are normal. She exhibits no distension and no mass. There is no tenderness.  Genitourinary:  Went to [facility].  Musculoskeletal: Normal range of motion. She exhibits no edema or tenderness.  Tender at the tip of the coccyx.  Lymphadenopathy:    She has no cervical adenopathy.  Neurological: She is alert and oriented to person, place, and time. No cranial nerve deficit. Coordination normal.  Skin: Skin is warm and dry. No  rash noted. She is not diaphoretic. No erythema. No pallor.   lipoma of the right greater trochanter with some scarring around it secondary to previous removals. Small scar under right ribs from prior boil.  Psychiatric: She has a normal mood and affect. Her behavior is normal. Thought content normal.     Labs reviewed: Appointment on 10/26/2014  Component Date Value Ref Range Status  . TSH 10/26/2014 7.740* 0.450 - 4.500 uIU/mL Final  . Glucose 10/26/2014 96  65 - 99 mg/dL Final  . BUN 10/26/2014 10  8 - 27 mg/dL Final  . Creatinine, Ser 10/26/2014 0.81  0.57 - 1.00 mg/dL Final  . GFR calc non Af Amer 10/26/2014 74  >59 mL/min/1.73 Final  . GFR calc Af Amer 10/26/2014 85  >59 mL/min/1.73 Final  . BUN/Creatinine Ratio 10/26/2014 12  11 - 26 Final  . Sodium 10/26/2014 140  134 - 144 mmol/L Final  . Potassium 10/26/2014 4.2  3.5 - 5.2 mmol/L Final  . Chloride 10/26/2014 100  97 - 108 mmol/L Final  . CO2 10/26/2014 24  18 - 29 mmol/L Final  . Calcium 10/26/2014 9.3  8.7 - 10.3 mg/dL Final  . Total Protein 10/26/2014 6.5  6.0 - 8.5 g/dL Final  . Albumin 10/26/2014 4.1  3.5 - 4.8 g/dL Final  . Globulin, Total 10/26/2014 2.4  1.5 - 4.5 g/dL Final  . Albumin/Globulin Ratio 10/26/2014 1.7  1.1 - 2.5 Final  . Bilirubin Total 10/26/2014 0.4  0.0 - 1.2 mg/dL Final  . Alkaline Phosphatase 10/26/2014 110  39 - 117 IU/L Final  . AST 10/26/2014 13  0 - 40 IU/L Final  . ALT 10/26/2014 12  0 - 32 IU/L Final  . Cholesterol, Total 10/26/2014 168  100 - 199 mg/dL Final  . Triglycerides 10/26/2014 171* 0 - 149 mg/dL Final  . HDL 10/26/2014 52  >39 mg/dL Final   Comment: According to ATP-III Guidelines, HDL-C >59 mg/dL is considered a negative risk factor for CHD.   Marland Kitchen VLDL Cholesterol Cal 10/26/2014 34  5 - 40 mg/dL Final  . LDL Calculated 10/26/2014 82  0 - 99 mg/dL Final  . Chol/HDL Ratio 10/26/2014 3.2  0.0 - 4.4 ratio units Final   Comment:                                   T. Chol/HDL Ratio                                              Men  Women                               1/2 Avg.Risk  3.4    3.3                                   Avg.Risk  5.0    4.4                                2X Avg.Risk  9.6    7.1                                3X Avg.Risk 23.4   11.0   . WBC 10/26/2014 8.4  3.4 - 10.8 x10E3/uL Final  . RBC 10/26/2014 4.94  3.77 - 5.28 x10E6/uL Final  . Hemoglobin 10/26/2014 15.3  11.1 - 15.9 g/dL Final  . Hematocrit 10/26/2014 46.5  34.0 - 46.6 % Final  . MCV 10/26/2014 94  79 - 97 fL Final  . MCH 10/26/2014 31.0  26.6 - 33.0 pg Final  . MCHC 10/26/2014 32.9  31.5 - 35.7 g/dL Final  . RDW 10/26/2014 13.9  12.3 - 15.4 % Final  . Platelets 10/26/2014 274  150 - 379 x10E3/uL Final  . Neutrophils 10/26/2014 58   Final  . Lymphs 10/26/2014 30   Final  . Monocytes 10/26/2014 7   Final  . Eos 10/26/2014 4   Final  . Basos 10/26/2014 1   Final  . Neutrophils Absolute 10/26/2014 5.0  1.4 - 7.0 x10E3/uL Final  . Lymphocytes Absolute 10/26/2014 2.5  0.7 -  3.1 x10E3/uL Final  . Monocytes Absolute 10/26/2014 0.6  0.1 - 0.9 x10E3/uL Final  . EOS (ABSOLUTE) 10/26/2014 0.3  0.0 - 0.4 x10E3/uL Final  . Basophils Absolute 10/26/2014 0.1  0.0 - 0.2 x10E3/uL Final  . Immature Granulocytes 10/26/2014 0   Final  . Immature Grans (Abs) 10/26/2014 0.0  0.0 - 0.1 x10E3/uL Final     Assessment/Plan 1. Depression Discontinued Fetzima Start Cymbalta 30 mg 1 daily  2. Essential hypertension Controlled  3. Moniliasis of mouth Resolved  4. Pain in finger of left hand Improved  5. Balance problem No new orders  6. Chronic obstructive airway disease with asthma Stop smoking - DG Chest 2 View; Future - ANORO ELLIPTA 62.5-25 MCG/INH AEPB; INL 1 PUFF DAILY TO HELP WITH BREATHING  Dispense: 60 each; Refill: 5  7. Chronic cough - ANORO ELLIPTA 62.5-25 MCG/INH AEPB; INL 1 PUFF DAILY TO HELP WITH BREATHING  Dispense: 60 each; Refill: 5  8. Screening mammogram for high-risk  patient - MM DIGITAL SCREENING BILATERAL; Future

## 2014-11-24 NOTE — Patient Instructions (Signed)
Take either the Fluoxetine (Prozac) or the Cymbalta.

## 2014-11-29 MED ORDER — DULOXETINE HCL 30 MG PO CPEP: ORAL_CAPSULE | ORAL | Status: DC

## 2014-12-14 ENCOUNTER — Telehealth: Payer: Self-pay | Admitting: *Deleted

## 2014-12-14 NOTE — Telephone Encounter (Signed)
Patient called requesting a letter to be dismissed from jury duty due to a cough. Please Advise.

## 2014-12-16 ENCOUNTER — Ambulatory Visit
Admission: RE | Admit: 2014-12-16 | Discharge: 2014-12-16 | Disposition: A | Discharge status: Home / Self Care | Payer: Medicare PPO | Source: Ambulatory Visit | Attending: Internal Medicine | Admitting: Internal Medicine

## 2014-12-16 ENCOUNTER — Encounter: Payer: Self-pay | Admitting: Internal Medicine

## 2014-12-16 DIAGNOSIS — Z1231 Encounter for screening mammogram for malignant neoplasm of breast: Secondary | ICD-10-CM

## 2014-12-16 NOTE — Telephone Encounter (Signed)
Letter written and printed and patient notified to pick up

## 2014-12-30 ENCOUNTER — Ambulatory Visit (INDEPENDENT_AMBULATORY_CARE_PROVIDER_SITE_OTHER): Payer: Medicare PPO | Admitting: Internal Medicine

## 2014-12-30 ENCOUNTER — Encounter: Payer: Self-pay | Admitting: Internal Medicine

## 2014-12-30 ENCOUNTER — Ambulatory Visit
Admission: RE | Admit: 2014-12-30 | Discharge: 2014-12-30 | Disposition: A | Discharge status: Home / Self Care | Payer: Medicare PPO | Source: Ambulatory Visit | Attending: Internal Medicine | Admitting: Internal Medicine

## 2014-12-30 ENCOUNTER — Telehealth: Payer: Self-pay

## 2014-12-30 VITALS — BP 130/90 | HR 74 | Temp 97.7°F | Resp 22 | Ht 65.0 in | Wt 157.4 lb

## 2014-12-30 DIAGNOSIS — R059 Cough, unspecified: Secondary | ICD-10-CM

## 2014-12-30 DIAGNOSIS — R05 Cough: Secondary | ICD-10-CM

## 2014-12-30 DIAGNOSIS — M25422 Effusion, left elbow: Secondary | ICD-10-CM

## 2014-12-30 DIAGNOSIS — Z720 Tobacco use: Secondary | ICD-10-CM

## 2014-12-30 DIAGNOSIS — M25522 Pain in left elbow: Secondary | ICD-10-CM | POA: Diagnosis not present

## 2014-12-30 DIAGNOSIS — R0600 Dyspnea, unspecified: Secondary | ICD-10-CM

## 2014-12-30 DIAGNOSIS — J449 Chronic obstructive pulmonary disease, unspecified: Secondary | ICD-10-CM | POA: Diagnosis not present

## 2014-12-30 DIAGNOSIS — F17200 Nicotine dependence, unspecified, uncomplicated: Secondary | ICD-10-CM

## 2014-12-30 DIAGNOSIS — R053 Chronic cough: Secondary | ICD-10-CM

## 2014-12-30 MED ORDER — PREDNISONE 5 MG PO TABS: ORAL_TABLET | ORAL | Status: DC

## 2014-12-30 MED ORDER — IPRATROPIUM-ALBUTEROL 0.5-2.5 (3) MG/3ML IN SOLN: RESPIRATORY_TRACT | Status: DC

## 2014-12-30 NOTE — Progress Notes (Signed)
Patient ID: Gabrielle White, female   DOB: Apr 25, 1945, 69 y.o.   MRN: [id number]    Facility  [facility]    Place of Service:   OFFICE    Allergies  Allergen Reactions  . Cafergot   . Codeine   . Fenoprofen Calcium   . Nalfon [Fenoprofen]     Chief Complaint  Patient presents with  . Acute Visit    lt elbow swollen from fall   . Medical Management of Chronic Issues    HPI:  Having increased difficulty breathing. 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 Denies fever or chills.  Cough - chronic. Non-productive.  Pain and swelling of left elbow - traumatic left ulnar bursa swelling. Pain-free at present.  Smoker -" I hardly smoke now"  Chronic obstructive airway disease with asthma - worse  Dyspnea - worse    Medications: Patient's Medications  New Prescriptions   No medications on file  Previous Medications   ALBUTEROL (VENTOLIN HFA) 108 (90 BASE) MCG/ACT INHALER    INHALE TWO PUFFS EVERY 4 HOURS AS NEEDED FOR COUGH OR SHORTNESS OF BREATH.   ALPRAZOLAM (XANAX) 0.5 MG TABLET    One up to twice daily for nervousness or sleep   ANORO ELLIPTA 62.5-25 MCG/INH AEPB    INL 1 PUFF DAILY TO HELP WITH BREATHING   ATORVASTATIN (LIPITOR) 40 MG TABLET    Take one tablet daily for cholesterol   BUDESONIDE-FORMOTEROL (SYMBICORT) 160-4.5 MCG/ACT INHALER    INHALE 2 PUFFS INTO THE LUNGS EVERY 12 HOURS(MORNING AND THEN 12 HOURS LATER) to help breathing   CHOLECALCIFEROL (VITAMIN D3) 1000 UNITS CAPS    Take 1 capsule by mouth daily.     DULOXETINE (CYMBALTA) 30 MG CAPSULE    One daly to help depression   FLUCONAZOLE (DIFLUCAN) 100 MG TABLET    One daily to treat oral thrush   LEVOTHYROXINE (SYNTHROID, LEVOTHROID) 75 MCG TABLET    One daily for thyroid supplement   LOSARTAN (COZAAR) 50 MG TABLET    Take one tablet by mouth once daily for blood pressure   PREDNISONE (STERAPRED UNI-PAK) 5 MG TABS TABLET    Take 6 tablets by mouth on day 1, Take 5 tablets by  mouth on day 2, Take 4 tablets by mouth on day 3, Take 3 tablets by mouth on day 4, Take 2 tablets by mouth on day 5, and take 1 tablets by mouth on day 6.  Modified Medications   No medications on file  Discontinued Medications   No medications on file     Review of Systems  Constitutional: Positive for fatigue. Negative for fever, chills, diaphoresis, activity change, appetite change and unexpected weight change.  HENT: Negative for congestion, ear discharge, ear pain, hearing loss, postnasal drip, rhinorrhea, sore throat, tinnitus, trouble swallowing and voice change.   Eyes: Negative.  Negative for pain, redness, itching and visual disturbance.  Respiratory: Positive for cough, shortness of breath and wheezing. Negative for choking.   Cardiovascular: Negative for chest pain, palpitations and leg swelling.  Gastrointestinal: Negative for nausea, vomiting, abdominal pain, diarrhea, constipation and abdominal distention.  Endocrine: Negative for cold intolerance, heat intolerance, polydipsia, polyphagia and polyuria.       History hypothyroidism  Genitourinary: Negative for dysuria, urgency, frequency, hematuria, flank pain, vaginal discharge, difficulty urinating and pelvic pain.       Episodes of urinary incontinence. Improved on Myrbetriq.  Musculoskeletal: Negative for myalgias, back pain, arthralgias, gait problem, neck pain  and neck stiffness.  Skin: Negative for color change, pallor and rash.  Allergic/Immunologic: Negative.   Neurological: Positive for dizziness. Negative for tremors, seizures, syncope, speech difficulty, weakness, light-headedness, numbness and headaches.  Hematological: Negative for adenopathy. Does not bruise/bleed easily.  Psychiatric/Behavioral: Positive for decreased concentration. Negative for suicidal ideas, hallucinations, behavioral problems, confusion, sleep disturbance, dysphoric mood and agitation. The patient is not nervous/anxious and is not  hyperactive.        Feeling depressed sometimes.    Filed Vitals:   12/30/14 1216  BP: 130/90  Pulse: 74  Temp: 97.7 F (36.5 C)  TempSrc: Oral  Resp: 22  Height: 5' 5" (1.651 m)  Weight: 157 lb 6.4 oz (71.396 kg)  SpO2: 90%   Body mass index is 26.19 kg/(m^2).  Physical Exam  Constitutional: She is oriented to person, place, and time. She appears well-developed and well-nourished. No distress.  HENT:  Head: Normocephalic and atraumatic.  Right Ear: External ear normal.  Left Ear: External ear normal.  Nose: Nose normal.  3 yeast plaques on the uvula and soft palate  Eyes: Conjunctivae and EOM are normal. Pupils are equal, round, and reactive to light. Left eye exhibits no discharge.  Corrective lenses.  Neck: Normal range of motion. Neck supple. No JVD present. No tracheal deviation present. No thyromegaly present.  Cardiovascular: Normal rate, regular rhythm, normal heart sounds and intact distal pulses.  Exam reveals no gallop and no friction rub.   No murmur heard. Pulmonary/Chest: She is in respiratory distress. She has wheezes. She has rales. She exhibits tenderness (left lower chest and to a lesser degree right lower chest).  Bilateral bronchial rattle. Partial clearing with cough. Wheezing. Has dyspnea at rest and when talking.  Abdominal: Soft. Bowel sounds are normal. She exhibits no distension and no mass. There is no tenderness.  Genitourinary:  Goes to [facility].  Musculoskeletal: Normal range of motion. She exhibits no edema or tenderness.  Tender at the tip of the coccyx.  Lymphadenopathy:    She has no cervical adenopathy.  Neurological: She is alert and oriented to person, place, and time. No cranial nerve deficit. Coordination normal.  Skin: Skin is warm and dry. No rash noted. She is not diaphoretic. No erythema. No pallor.   lipoma of the right greater trochanter with some scarring around it secondary to previous removals. Small scar under  right ribs from prior boil.  Psychiatric: She has a normal mood and affect. Her behavior is normal. Thought content normal.     Labs reviewed: Lab Summary Latest Ref Rng 10/26/2014 12/10/2013  Hemoglobin 11.1 - 15.9 g/dL 15.3 (None)  Hematocrit 34.0 - 46.6 % 46.5 (None)  White count 3.4 - 10.8 x10E3/uL 8.4 (None)  Platelet count 150 - 379 x10E3/uL 274 (None)  Sodium 134 - 144 mmol/L 140 140  Potassium 3.5 - 5.2 mmol/L 4.2 4.8  Calcium 8.7 - 10.3 mg/dL 9.3 9.7  Phosphorus - (None) (None)  Creatinine 0.57 - 1.00 mg/dL 0.81 0.78  AST 0 - 40 IU/L 13 13  Alk Phos 39 - 117 IU/L 110 102  Bilirubin 0.0 - 1.2 mg/dL 0.4 0.2  Glucose 65 - 99 mg/dL 96 92  Cholesterol - (None) (None)  HDL cholesterol >39 mg/dL 52 54  Triglycerides 0 - 149 mg/dL 171(H) 133  LDL Direct - (None) (None)  LDL Calc 0 - 99 mg/dL 82 102(H)  Total protein - (None) (None)  Albumin 3.5 - 4.8 g/dL 4.1 4.1   Lab  Results  Component Value Date   TSH 7.740* 10/26/2014   Lab Results  Component Value Date   BUN 10 10/26/2014   Lab Results  Component Value Date   HGBA1C 5.9* 06/17/2013       Assessment/Plan 1. Cough - DG Chest 2 View; Future  2. Pain and swelling of left elbow Ulnar swelling. Counseled her to sleepsince there is very minimal tenderness there. Also recommended a elbow sleeve that puts a little compression over the elbow.  3. Chronic cough CXR  4. Smoker Must stop smoking.  5. Chronic obstructive airway disease with asthma -Prednisone 5 mg dose pack - DME Nebulizer machine - ipratropium-albuterol (DUONEB) 0.5-2.5 (3) MG/3ML SOLN; Use every 6 hours to help breathing  Dispense: 360 mL; Refill: 3  6. Dyspnea -Prednisone 5 mg dose pack - ipratropium-albuterol (DUONEB) 0.5-2.5 (3) MG/3ML SOLN; Use every 6 hours to help breathing  Dispense: 360 mL; Refill: 3

## 2014-12-30 NOTE — Telephone Encounter (Addendum)
Patient is at the pharmacy because she was told a prednisone pack would be sent in and they have nothing for her at the pharmacy. Patient would like rx sent in ASAP.  After reviewing note it appears rx was to be sent in, rx was sent to [facility] ([place]) per patient request

## 2015-01-01 NOTE — Addendum Note (Signed)
Addended by: Rafael Bihari A on: 01/01/2015 08:46 AM   Modules accepted: Orders

## 2015-01-04 ENCOUNTER — Telehealth: Payer: Self-pay | Admitting: *Deleted

## 2015-01-04 NOTE — Telephone Encounter (Signed)
Received form from [place] [phone] for Nebulizer and Mediation Ipratropium/Albuterol. Given to Dr. Nyoka Cowden to review and sign. To fax back to #: [phone]

## 2015-01-06 ENCOUNTER — Ambulatory Visit (INDEPENDENT_AMBULATORY_CARE_PROVIDER_SITE_OTHER): Payer: Medicare PPO | Admitting: Internal Medicine

## 2015-01-06 ENCOUNTER — Encounter: Payer: Self-pay | Admitting: Internal Medicine

## 2015-01-06 VITALS — BP 110/88 | HR 83 | Temp 98.4°F | Resp 20 | Ht 65.0 in | Wt 158.2 lb

## 2015-01-06 DIAGNOSIS — Z720 Tobacco use: Secondary | ICD-10-CM | POA: Diagnosis not present

## 2015-01-06 DIAGNOSIS — J45909 Unspecified asthma, uncomplicated: Secondary | ICD-10-CM | POA: Diagnosis not present

## 2015-01-06 DIAGNOSIS — I10 Essential (primary) hypertension: Secondary | ICD-10-CM

## 2015-01-06 DIAGNOSIS — J449 Chronic obstructive pulmonary disease, unspecified: Secondary | ICD-10-CM

## 2015-01-06 DIAGNOSIS — R0600 Dyspnea, unspecified: Secondary | ICD-10-CM

## 2015-01-06 DIAGNOSIS — R05 Cough: Secondary | ICD-10-CM

## 2015-01-06 DIAGNOSIS — F17200 Nicotine dependence, unspecified, uncomplicated: Secondary | ICD-10-CM

## 2015-01-06 DIAGNOSIS — R059 Cough, unspecified: Secondary | ICD-10-CM

## 2015-01-06 MED ORDER — PREDNISONE 5 MG PO TABS: ORAL_TABLET | ORAL | Status: DC

## 2015-01-06 MED ORDER — LOSARTAN POTASSIUM 50 MG PO TABS: ORAL_TABLET | ORAL | Status: DC

## 2015-01-06 NOTE — Progress Notes (Signed)
Patient ID: Gabrielle White, female   DOB: [birth year], 69 y.o.   MRN: [id number]    Facility  [facility]    Place of Service:   OFFICE    Allergies  Allergen Reactions  . Cafergot   . Codeine   . Fenoprofen Calcium   . Nalfon [Fenoprofen]     Chief Complaint  Patient presents with  . Medical Management of Chronic Issues    6 month follow-up for Hypertension, Hypothroidism, Hyperlipidemia, Hyperglycemia    HPI:  Patient returns today for follow-up of her COPD and respiratory distress from the last visit. She has benefited from the prednisone dose pack and regular use of inhaled medications. She finds it difficult to use the DuoNeb as frequently as she feels that she needs it because she is continuing to work. LABA is recommended for longer acting effects.  Cough - slightly improved. Still producing white phlegm.  Dyspnea - improved. Still notes wheezing. Cough with exertion.  Essential hypertension - controlled  Smoker - strongly advised to quit smoking. Environmental allergens (dogs, horses, hay, dust) should be minimized as she can arrange it.    Medications: Patient's Medications  New Prescriptions   No medications on file  Previous Medications   ALBUTEROL (VENTOLIN HFA) 108 (90 BASE) MCG/ACT INHALER    INHALE TWO PUFFS EVERY 4 HOURS AS NEEDED FOR COUGH OR SHORTNESS OF BREATH.   ALPRAZOLAM (XANAX) 0.5 MG TABLET    One up to twice daily for nervousness or sleep   ATORVASTATIN (LIPITOR) 40 MG TABLET    Take one tablet daily for cholesterol   BUDESONIDE-FORMOTEROL (SYMBICORT) 160-4.5 MCG/ACT INHALER    INHALE 2 PUFFS INTO THE LUNGS EVERY 12 HOURS(MORNING AND THEN 12 HOURS LATER) to help breathing   CHOLECALCIFEROL (VITAMIN D3) 1000 UNITS CAPS    Take 1 capsule by mouth daily.     FLUCONAZOLE (DIFLUCAN) 100 MG TABLET    One daily to treat oral thrush   IPRATROPIUM-ALBUTEROL (DUONEB) 0.5-2.5 (3) MG/3ML SOLN    Use every 6 hours to help breathing   LEVOTHYROXINE (SYNTHROID,  LEVOTHROID) 75 MCG TABLET    One daily for thyroid supplement   LOSARTAN (COZAAR) 50 MG TABLET    Take one tablet by mouth once daily for blood pressure   PREDNISONE (DELTASONE) 5 MG TABLET    Take 6 tablets by mouth on day 1, Take 5 tablets by mouth on day 2, Take 4 tablets by mouth on day 3, Take 3 tablets by mouth on day 4, Take 2 tablets by mouth on day 5, and take 1 tablets by mouth on day 6.  Modified Medications   No medications on file  Discontinued Medications   ANORO ELLIPTA 62.5-25 MCG/INH AEPB    INL 1 PUFF DAILY TO HELP WITH BREATHING   DULOXETINE (CYMBALTA) 30 MG CAPSULE    One daly to help depression    Review of Systems  Constitutional: Positive for fatigue. Negative for fever, chills, diaphoresis, activity change, appetite change and unexpected weight change.  HENT: Negative for congestion, ear discharge, ear pain, hearing loss, postnasal drip, rhinorrhea, sore throat, tinnitus, trouble swallowing and voice change.   Eyes: Negative.  Negative for pain, redness, itching and visual disturbance.  Respiratory: Positive for cough, shortness of breath and wheezing. Negative for choking.   Cardiovascular: Negative for chest pain, palpitations and leg swelling.  Gastrointestinal: Negative for nausea, vomiting, abdominal pain, diarrhea, constipation and abdominal distention.  Endocrine: Negative for cold intolerance, heat intolerance, polydipsia, polyphagia  and polyuria.       History hypothyroidism  Genitourinary: Negative for dysuria, urgency, frequency, hematuria, flank pain, vaginal discharge, difficulty urinating and pelvic pain.       Episodes of urinary incontinence. Improved on Myrbetriq.  Musculoskeletal: Negative for myalgias, back pain, arthralgias, gait problem, neck pain and neck stiffness.  Skin: Negative for color change, pallor and rash.  Allergic/Immunologic: Negative.   Neurological: Positive for dizziness. Negative for tremors, seizures, syncope, speech difficulty,  weakness, light-headedness, numbness and headaches.  Hematological: Negative for adenopathy. Does not bruise/bleed easily.  Psychiatric/Behavioral: Positive for decreased concentration. Negative for suicidal ideas, hallucinations, behavioral problems, confusion, sleep disturbance, dysphoric mood and agitation. The patient is not nervous/anxious and is not hyperactive.        Feeling depressed sometimes.    Filed Vitals:   01/06/15 1512  BP: 110/88  Pulse: 83  Temp: 98.4 F (36.9 C)  TempSrc: Oral  Resp: 20  Height: '5\' 5"'  (1.651 m)  Weight: 158 lb 3.2 oz (71.759 kg)  SpO2: 96%   Body mass index is 26.33 kg/(m^2).  Physical Exam  Constitutional: She is oriented to person, place, and time. She appears well-developed and well-nourished. No distress.  HENT:  Head: Normocephalic and atraumatic.  Right Ear: External ear normal.  Left Ear: External ear normal.  Nose: Nose normal.  3 yeast plaques on the uvula and soft palate  Eyes: Conjunctivae and EOM are normal. Pupils are equal, round, and reactive to light. Left eye exhibits no discharge.  Corrective lenses.  Neck: Normal range of motion. Neck supple. No JVD present. No tracheal deviation present. No thyromegaly present.  Cardiovascular: Normal rate, regular rhythm, normal heart sounds and intact distal pulses.  Exam reveals no gallop and no friction rub.   No murmur heard. Pulmonary/Chest: She is in respiratory distress. She has wheezes. She has rales. She exhibits tenderness (left lower chest and to a lesser degree right lower chest).  Bilateral bronchial rattle. Partial clearing with cough. Wheezing. Has dyspnea at rest and when talking.  Abdominal: Soft. Bowel sounds are normal. She exhibits no distension and no mass. There is no tenderness.  Genitourinary:  Goes to [facility].  Musculoskeletal: Normal range of motion. She exhibits no edema or tenderness.  Tender at the tip of the coccyx.  Lymphadenopathy:    She  has no cervical adenopathy.  Neurological: She is alert and oriented to person, place, and time. No cranial nerve deficit. Coordination normal.  Skin: Skin is warm and dry. No rash noted. She is not diaphoretic. No erythema. No pallor.   lipoma of the right greater trochanter with some scarring around it secondary to previous removals. Small scar under right ribs from prior boil.  Psychiatric: She has a normal mood and affect. Her behavior is normal. Thought content normal.    Labs reviewed: Lab Summary Latest Ref Rng 10/26/2014 12/10/2013  Hemoglobin 11.1 - 15.9 g/dL 15.3 (None)  Hematocrit 34.0 - 46.6 % 46.5 (None)  White count 3.4 - 10.8 x10E3/uL 8.4 (None)  Platelet count 150 - 379 x10E3/uL 274 (None)  Sodium 134 - 144 mmol/L 140 140  Potassium 3.5 - 5.2 mmol/L 4.2 4.8  Calcium 8.7 - 10.3 mg/dL 9.3 9.7  Phosphorus - (None) (None)  Creatinine 0.57 - 1.00 mg/dL 0.81 0.78  AST 0 - 40 IU/L 13 13  Alk Phos 39 - 117 IU/L 110 102  Bilirubin 0.0 - 1.2 mg/dL 0.4 0.2  Glucose 65 - 99 mg/dL 96 92  Cholesterol - (None) (None)  HDL cholesterol >39 mg/dL 52 54  Triglycerides 0 - 149 mg/dL 171(H) 133  LDL Direct - (None) (None)  LDL Calc 0 - 99 mg/dL 82 102(H)  Total protein - (None) (None)  Albumin 3.5 - 4.8 g/dL 4.1 4.1   Lab Results  Component Value Date   TSH 7.740* 10/26/2014   Lab Results  Component Value Date   BUN 10 10/26/2014   Lab Results  Component Value Date   HGBA1C 5.9* 06/17/2013    Assessment/Plan  1. Chronic obstructive airway disease with asthma ([place]) Prednisone Dosepak was ordered for her to keep on hand in case she gets suddenly worse again. - predniSONE (DELTASONE) 5 MG tablet; Take 6 tablets by mouth on day 1, Take 5 tablets by mouth on day 2, Take 4 tablets by mouth on day 3, Take 3 tablets by mouth on day 4, Take 2 tablets by mouth on day 5, and take 1 tablets by mouth on day 6.  Dispense: 21 tablet; Refill: 0 - arformoterol (BROVANA) 15 MCG/2ML NEBU;  Inhale one ampule using nebulizer every 12 hours to help breathing  Dispense: 120 mL; Refill: 5  2. Cough Continue current inhalers including the change to Brovana  3. Dyspnea Monitor  4. Essential hypertension Continue current medication - losartan (COZAAR) 50 MG tablet; Take one tablet by mouth once daily for blood pressure  Dispense: 90 tablet; Refill: 3  5. Smoker Must stop smoking and manage environmental allergens.

## 2015-01-07 ENCOUNTER — Telehealth: Payer: Self-pay | Admitting: *Deleted

## 2015-01-07 MED ORDER — ARFORMOTEROL TARTRATE 15 MCG/2ML IN NEBU: INHALATION_SOLUTION | RESPIRATORY_TRACT | Status: DC

## 2015-01-07 NOTE — Telephone Encounter (Signed)
Please call [facility] [phone] and fax to: [phone] when completed.

## 2015-01-07 NOTE — Telephone Encounter (Signed)
Gabrielle White with [employer] stopped by the office requesting Dr. Nyoka Cowden to add an addendum to his OV note stating he changed patient to [facility] instead of Duoneb. Medicare has to have it noted in chart note to cover.

## 2015-01-07 NOTE — Telephone Encounter (Signed)
Notes printed and faxed

## 2015-01-08 ENCOUNTER — Telehealth: Payer: Self-pay | Admitting: *Deleted

## 2015-01-08 NOTE — Telephone Encounter (Signed)
Received fax from [place] [phone] to sign orders for patient to receive Brovan and Ipratropium/Albuterol given to Dr. Nyoka Cowden to review and sign. (Dr. Rolly Salter required signature). To be faxed back to [facility] Fax: [phone]

## 2015-01-11 ENCOUNTER — Other Ambulatory Visit: Payer: Self-pay | Admitting: *Deleted

## 2015-01-11 DIAGNOSIS — E785 Hyperlipidemia, unspecified: Secondary | ICD-10-CM

## 2015-01-11 MED ORDER — ATORVASTATIN CALCIUM 40 MG PO TABS: ORAL_TABLET | ORAL | Status: DC

## 2015-01-11 NOTE — Telephone Encounter (Signed)
Patient requested to be faxed to pharmacy 

## 2015-01-12 ENCOUNTER — Encounter: Payer: Self-pay | Admitting: Internal Medicine

## 2015-01-12 ENCOUNTER — Ambulatory Visit (INDEPENDENT_AMBULATORY_CARE_PROVIDER_SITE_OTHER): Payer: Medicare PPO | Admitting: Internal Medicine

## 2015-01-12 VITALS — BP 142/92 | HR 66 | Temp 97.8°F | Wt 159.0 lb

## 2015-01-12 DIAGNOSIS — J45909 Unspecified asthma, uncomplicated: Secondary | ICD-10-CM | POA: Diagnosis not present

## 2015-01-12 DIAGNOSIS — R0600 Dyspnea, unspecified: Secondary | ICD-10-CM | POA: Diagnosis not present

## 2015-01-12 DIAGNOSIS — E785 Hyperlipidemia, unspecified: Secondary | ICD-10-CM

## 2015-01-12 DIAGNOSIS — B370 Candidal stomatitis: Secondary | ICD-10-CM

## 2015-01-12 DIAGNOSIS — J449 Chronic obstructive pulmonary disease, unspecified: Secondary | ICD-10-CM

## 2015-01-12 DIAGNOSIS — R053 Chronic cough: Secondary | ICD-10-CM

## 2015-01-12 DIAGNOSIS — R05 Cough: Secondary | ICD-10-CM

## 2015-01-12 MED ORDER — MAGIC MOUTHWASH: ORAL | Status: DC

## 2015-01-12 MED ORDER — FLUCONAZOLE 100 MG PO TABS: ORAL_TABLET | ORAL | Status: DC

## 2015-01-12 MED ORDER — ATORVASTATIN CALCIUM 40 MG PO TABS: ORAL_TABLET | ORAL | Status: DC

## 2015-01-13 NOTE — Progress Notes (Signed)
Patient ID: Gabrielle White, female   DOB: Jun 05, 1945, 69 y.o.   MRN: [id number]    Facility  [facility]    Place of Service:   OFFICE    Allergies  Allergen Reactions  . Cafergot   . Codeine   . Fenoprofen Calcium   . Nalfon [Fenoprofen]     Chief Complaint  Patient presents with  . Medication Reaction    Saturday used nebulizer, couldn't breath. She looked at throat had little white stuff on it. Now looks like a sore and throat sore.     HPI:  Patient has had an episode where she "couldn't breathe". When she looked at her throat and had little white areas on it. Now it looks like it is erythematous sore throat. Her throat is sore.  Patient is overusing the inhalers and nebulizers that she was given. She has been using albuterol via hand-held nebulizer more frequently than every 4 hours. In addition she has been using the DuoNeb's every 6 hours. She just got her probiotic today so that one has not been used. She overused her Symbicort by using it every 6 hours, so she is out of this medication. She still has her Anoro inhaler.  Medications: Patient's Medications  New Prescriptions   MAGIC MOUTHWASH SOLN    [facility] magic mouthwash:  Gargle 2-5 cc 4 times daily for oral yeast infectioin  Previous Medications   ALBUTEROL (VENTOLIN HFA) 108 (90 BASE) MCG/ACT INHALER    INHALE TWO PUFFS EVERY 4 HOURS AS NEEDED FOR COUGH OR SHORTNESS OF BREATH.   ALPRAZOLAM (XANAX) 0.5 MG TABLET    One up to twice daily for nervousness or sleep   ARFORMOTEROL (BROVANA) 15 MCG/2ML NEBU    Inhale one ampule using nebulizer every 12 hours to help breathing   BUDESONIDE-FORMOTEROL (SYMBICORT) 160-4.5 MCG/ACT INHALER    INHALE 2 PUFFS INTO THE LUNGS EVERY 12 HOURS(MORNING AND THEN 12 HOURS LATER) to help breathing   CHOLECALCIFEROL (VITAMIN D3) 1000 UNITS CAPS    Take 1 capsule by mouth daily.     IPRATROPIUM-ALBUTEROL (DUONEB) 0.5-2.5 (3) MG/3ML SOLN    Use every 6 hours to help breathing   LEVOTHYROXINE  (SYNTHROID, LEVOTHROID) 75 MCG TABLET    One daily for thyroid supplement   LOSARTAN (COZAAR) 50 MG TABLET    Take one tablet by mouth once daily for blood pressure   PREDNISONE (DELTASONE) 5 MG TABLET    Take 6 tablets by mouth on day 1, Take 5 tablets by mouth on day 2, Take 4 tablets by mouth on day 3, Take 3 tablets by mouth on day 4, Take 2 tablets by mouth on day 5, and take 1 tablets by mouth on day 6.  Modified Medications   Modified Medication Previous Medication   ATORVASTATIN (LIPITOR) 40 MG TABLET atorvastatin (LIPITOR) 40 MG tablet      Take one tablet by mouth once daily for cholesterol    Take one tablet by mouth once daily for cholesterol   FLUCONAZOLE (DIFLUCAN) 100 MG TABLET fluconazole (DIFLUCAN) 100 MG tablet      One daily to treat oral thrush    One daily to treat oral thrush  Discontinued Medications   No medications on file    Review of Systems  Constitutional: Positive for fatigue. Negative for fever, chills, diaphoresis, activity change, appetite change and unexpected weight change.  HENT: Positive for sore throat. Negative for congestion, ear discharge, ear pain, hearing loss, postnasal drip, rhinorrhea, tinnitus, trouble  swallowing and voice change.   Eyes: Negative.  Negative for pain, redness, itching and visual disturbance.  Respiratory: Positive for cough, shortness of breath and wheezing. Negative for choking.   Cardiovascular: Negative for chest pain, palpitations and leg swelling.  Gastrointestinal: Negative for nausea, vomiting, abdominal pain, diarrhea, constipation and abdominal distention.  Endocrine: Negative for cold intolerance, heat intolerance, polydipsia, polyphagia and polyuria.       History hypothyroidism  Genitourinary: Negative for dysuria, urgency, frequency, hematuria, flank pain, vaginal discharge, difficulty urinating and pelvic pain.       Episodes of urinary incontinence. Improved on Myrbetriq.  Musculoskeletal: Negative for myalgias,  back pain, arthralgias, gait problem, neck pain and neck stiffness.  Skin: Negative for color change, pallor and rash.  Allergic/Immunologic: Negative.   Neurological: Positive for dizziness. Negative for tremors, seizures, syncope, speech difficulty, weakness, light-headedness, numbness and headaches.  Hematological: Negative for adenopathy. Does not bruise/bleed easily.  Psychiatric/Behavioral: Positive for decreased concentration. Negative for suicidal ideas, hallucinations, behavioral problems, confusion, sleep disturbance, dysphoric mood and agitation. The patient is not nervous/anxious and is not hyperactive.        Feeling depressed sometimes.    Filed Vitals:   01/12/15 1500  BP: 142/92  Pulse: 66  Temp: 97.8 F (36.6 C)  TempSrc: Oral  Weight: 159 lb (72.122 kg)  SpO2: 95%   Body mass index is 26.46 kg/(m^2).  Physical Exam  Constitutional: She is oriented to person, place, and time. She appears well-developed and well-nourished. No distress.  HENT:  Head: Normocephalic and atraumatic.  Right Ear: External ear normal.  Left Ear: External ear normal.  Nose: Nose normal.  yeast plaques on the uvula and soft palate. Erythema of tonsillar crypts bilaterally. No adenopathy.  Eyes: Conjunctivae and EOM are normal. Pupils are equal, round, and reactive to light. Left eye exhibits no discharge.  Corrective lenses.  Neck: Normal range of motion. Neck supple. No JVD present. No tracheal deviation present. No thyromegaly present.  Cardiovascular: Normal rate, regular rhythm, normal heart sounds and intact distal pulses.  Exam reveals no gallop and no friction rub.   No murmur heard. Pulmonary/Chest: She is in respiratory distress. She has wheezes. She has rales. She exhibits tenderness (left lower chest and to a lesser degree right lower chest).  Bilateral bronchial rattle. Partial clearing with cough. Wheezing. Has dyspnea at rest and when talking.  Abdominal: Soft. Bowel sounds  are normal. She exhibits no distension and no mass. There is no tenderness.  Genitourinary:  Goes to [facility].  Musculoskeletal: Normal range of motion. She exhibits no edema or tenderness.  Tender at the tip of the coccyx.  Lymphadenopathy:    She has no cervical adenopathy.  Neurological: She is alert and oriented to person, place, and time. No cranial nerve deficit. Coordination normal.  Skin: Skin is warm and dry. No rash noted. She is not diaphoretic. No erythema. No pallor.   lipoma of the right greater trochanter with some scarring around it secondary to previous removals. Small scar under right ribs from prior boil.  Psychiatric: She has a normal mood and affect. Her behavior is normal. Thought content normal.    Labs reviewed: Lab Summary Latest Ref Rng 10/26/2014  Hemoglobin 11.1 - 15.9 g/dL 15.3  Hematocrit 34.0 - 46.6 % 46.5  White count 3.4 - 10.8 x10E3/uL 8.4  Platelet count 150 - 379 x10E3/uL 274  Sodium 134 - 144 mmol/L 140  Potassium 3.5 - 5.2 mmol/L 4.2  Calcium 8.7 -  10.3 mg/dL 9.3  Phosphorus - (None)  Creatinine 0.57 - 1.00 mg/dL 0.81  AST 0 - 40 IU/L 13  Alk Phos 39 - 117 IU/L 110  Bilirubin 0.0 - 1.2 mg/dL 0.4  Glucose 65 - 99 mg/dL 96  Cholesterol - (None)  HDL cholesterol >39 mg/dL 52  Triglycerides 0 - 149 mg/dL 171(H)  LDL Direct - (None)  LDL Calc 0 - 99 mg/dL 82  Total protein - (None)  Albumin 3.5 - 4.8 g/dL 4.1   Lab Results  Component Value Date   TSH 7.740* 10/26/2014   Lab Results  Component Value Date   BUN 10 10/26/2014   Lab Results  Component Value Date   HGBA1C 5.9* 06/17/2013    Assessment/Plan 1. Moniliasis of mouth - fluconazole (DIFLUCAN) 100 MG tablet; One daily to treat oral thrush  Dispense: 3 tablet; Refill: 0 - magic mouthwash SOLN; [facility] magic mouthwash:  Gargle 2-5 cc 4 times daily for oral yeast infectioin  Dispense: 150 mL; Refill: 1  2. Hyperlipidemia - atorvastatin (LIPITOR) 40 MG tablet; Take  one tablet by mouth once daily for cholesterol  Dispense: 90 tablet; Refill: 1  3. Chronic obstructive airway disease with asthma ([place]) I reviewed her medications with her extensively. She is now to use Brovana twice daily and Symbicort twice daily. Her rescue inhalers will be either albuterol sulfate via hand-held nebulizer or DuoNeb via nebulizer. I recommended use of the nebulizer whenever the situation would allow.  - Ambulatory referral to Pulmonology  4. Chronic cough - Ambulatory referral to Pulmonology  5. Dyspnea Medications reviewed

## 2015-01-21 ENCOUNTER — Other Ambulatory Visit: Payer: Self-pay | Admitting: Internal Medicine

## 2015-01-22 ENCOUNTER — Ambulatory Visit (INDEPENDENT_AMBULATORY_CARE_PROVIDER_SITE_OTHER): Payer: Medicare PPO | Admitting: Internal Medicine

## 2015-01-22 ENCOUNTER — Encounter: Payer: Self-pay | Admitting: Internal Medicine

## 2015-01-22 VITALS — BP 118/80 | HR 90 | Ht 65.0 in | Wt 156.4 lb

## 2015-01-22 DIAGNOSIS — J45909 Unspecified asthma, uncomplicated: Secondary | ICD-10-CM

## 2015-01-22 DIAGNOSIS — Z720 Tobacco use: Secondary | ICD-10-CM | POA: Diagnosis not present

## 2015-01-22 DIAGNOSIS — F17210 Nicotine dependence, cigarettes, uncomplicated: Secondary | ICD-10-CM

## 2015-01-22 DIAGNOSIS — J449 Chronic obstructive pulmonary disease, unspecified: Secondary | ICD-10-CM | POA: Diagnosis not present

## 2015-01-22 DIAGNOSIS — R0600 Dyspnea, unspecified: Secondary | ICD-10-CM | POA: Diagnosis not present

## 2015-01-22 MED ORDER — BUDESONIDE-FORMOTEROL FUMARATE 160-4.5 MCG/ACT IN AERO: INHALATION_SPRAY | RESPIRATORY_TRACT | Status: DC

## 2015-01-22 MED ORDER — PREDNISONE 10 MG PO TABS: ORAL_TABLET | ORAL | Status: DC

## 2015-01-22 NOTE — Assessment & Plan Note (Addendum)
pfts 11/18/10  FEV1  2.0 (95%) ratio 73 and no change p saba and DLCO 75 corrects to 100%     Chronically poor control and much worse x 4 weeks.  DDX of  difficult airways management all start with A and  include Adherence, Ace Inhibitors, Acid Reflux, Active Sinus Disease, Alpha 1 Antitripsin deficiency, Anxiety masquerading as Airways dz,  ABPA,  allergy(esp in young), Aspiration (esp in elderly), Adverse effects of meds,  Active smokers, A bunch of PE's (a small clot burden can't cause this syndrome unless there is already severe underlying pulm or vascular dz with poor reserve) plus two Bs  = Bronchiectasis and Beta blocker use..and one C= CHF  In this case Adherence is the biggest issue and starts with  inability to use HFA effectively and also  understand that SABA treats the symptoms but doesn't get to the underlying problem (inflammation).  I used  the analogy of putting steroid cream on a rash to help explain the meaning of topical therapy and the need to get the drug to the target tissue.   -The proper method of use, as well as anticipated side effects, of a metered-dose inhaler are discussed and demonstrated to the patient. Improved effectiveness after extensive coaching during this visit to a level of approximately  75% from a baseline of < 50%   Active smoking the obvious other big concern > see sep a/p  ? Acid (or non-acid) GERD > always difficult to exclude as up to 75% of pts in some series report no assoc GI/ Heartburn symptoms> rec max (24h)  acid suppression and diet restrictions/ reviewed and instructions given in writing.   ? Anxiety > usually at the bottom of this list of usual suspects but should be much higher on this pt's based on H and P and note already on psychotropics .    I had an extended discussion with the patient reviewing all relevant studies completed to date and  lasting 35  minutes of a 60  minute visit    Each maintenance medication was reviewed in detail  including most importantly the difference between maintenance and prns and under what circumstances the prns are to be triggered using an action plan format that is not reflected in the computer generated alphabetically organized AVS.    Please see instructions for details which were reviewed in writing and the patient given a copy highlighting the part that I personally wrote and discussed at today's ov.

## 2015-01-22 NOTE — Assessment & Plan Note (Signed)

## 2015-01-22 NOTE — Patient Instructions (Addendum)
Start Prednisone 10mg  Take 4 for two days three for two days two for two days one for two days   Plan A =  Automatic = symbicort 160 Take 2 puffs first thing in am and then another 2 puffs about 12 hours later.   Work on Engineer, technical sales technique:  relax and gently blow all the way out then take a nice smooth deep breath back in, triggering the inhaler at same time you start breathing in.  Hold for up to 5 seconds if you can. Blow out thru nose. Rinse and gargle with water when done  Plan B = Backup  Only use your albuterol as a rescue medication to be used if you can't catch your breath by resting or doing a relaxed purse lip breathing pattern.  - The less you use it, the better it will work when you need it. - Ok to use up to 2 puffs  every 4 hours if you must but call for immediate appointment if use goes up over your usual need - Don't leave home without it !!  (think of it like the spare tire for your car)   Plan C = crisis  Only use nebulizer iprotropium-albuterol (duoneb) if you try the ventolin first and it fails to help > ok to use up to every 4 hours   Plan D = doctor call us if not improving   Plan E = ER > go there if all else fails  Try prilosec otc 20mg   Take 30-60 min before first meal of the day and Pepcid ac (famotidine) 20 mg one @  bedtime     GERD (REFLUX)  is an extremely common cause of respiratory symptoms just like yours , many times with no obvious heartburn at all.    It can be treated with medication, but also with lifestyle changes including elevation of the head of your bed (ideally with 6 inch  bed blocks),   Smoking cessation, avoidance of late meals, excessive alcohol, and avoid fatty foods, chocolate, peppermint, colas, red wine, and acidic juices such as orange juice.  NO MINT OR MENTHOL PRODUCTS SO NO COUGH DROPS  USE SUGARLESS CANDY INSTEAD (Jolley ranchers or Stover's or Life Savers) or even ice chips will also do - the key is to swallow to prevent  all throat clearing. NO OIL BASED VITAMINS - use powdered substitutes.    Please schedule a follow up office visit in 6 weeks, call sooner if needed with pfts on return

## 2015-01-22 NOTE — Progress Notes (Signed)
Subjective:    Patient ID: Gabrielle White, female    DOB: 19-Oct-1945 .   MRN: [id number]  HPI  Ramaswamy note IOV 11/10/2010:  Smoker. Referred by Dr. Posey Pronto for possible copd.   Reports insidious onset chronic cough for few years. Slowly progressive. Moderate in severity. Associated thin white sputum + of small amounts present. Associated dogs, cats and horses present but she is not sure that this makes cough worse though she feels that the house she has lived in for past 5 years might be related to cough onset though she does not know what. States house is clean though dog hair present. Cough made worse occ by lying down and cig smoke but not always. Cough improved by a 5 day prednisone course in end June 2012 when she went to ER for acute dyspnea. Of note, Kouffman Reflux Symptom Index Score (RSI) is 11 and therefore against dx of LPR.   Also, reports 1  Year chronic dyspnea of insidious onset. Heat and activity makes dyspnea worse. Walking dogs 1/2 mile is no problem but any heavier activity more than walking makes her more dyspneic. Also, progressive. Rates dyspnea as moderate. Improved by rest  On   09/25/2010 wennt to ER with acute dyspnea after being in horse stall - ddimer, ck, ekg, trop (reviewed) all normal. CXR showed emphysema and was sent home on 5 day prednisone that helped immensely ("oh yeah"). Note, did not desaturate walking 185 feet x 3 laps in office Ongoing tobacco abuse. Quit for 15 years at age 34. 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 Interested in quitting smoking. Has taken chantix in past but does not remember if any problems with it. Denies associated chest pain, gerd, edema, sputum, inhaler intake but noted to have WC 11.5k - 15K past 2 months on labs. Heme cx pending per hx oday  . She continues to smoke. We discussed several options for smoking cesstation w/ pt education . She is maintained on Symbicort and tolerating well  with no increased use of SABA .   REC Most important goal is to quit smoking  Try a lot of the helpful quit smoking suggestions we discussed.  follow up Dr. Chase Caller in 3 months and As needed   OV 02/03/11: Followup smoking, cough, dyspnea.   Still smoking. Unable to quit  Still with cough. Moderate intensity. Increased when she lies down or when she laughs. Does not feel tickle in throat. Associated white-yellow mucus present; small amounts. Occ sinus drainage +. Very rare heartburn +. Overall stable wihtout change   Still dyspneic. Unchanged. Dyspnea with exertion. Relieved by rest. For class 2-3 activities. Did not think symbicort helped when she took it for 1 month and then quit. Not taken symbicort in 2-3 months Also reports 4 dogs and 1 cat in small house indoors   PFTs 11/18/10: FEV1 of 1.91 L (90%), ratio of 73, DLCO  75% No sign change with SABA  #smoking  - try to work on quitting #cough  - unclear why but let us figure out the shortness of breath first #shortness of breath  - unclear why so have CPX bike exercise test with exercise induced challenge for asthma #Followup  - after cpx bike test > did not happen    01/16/2013  Acute  ov/Elyssia Strausser re: sob/ AB  Chief Complaint  Patient presents with  . Acute Visit    Pt c/o increased SOB for the past month.  She states that she gets SOB when at night when she lies down and when she first wakes up in the am.  She has been using her rescue inhaler approx 4 times per day. She also c/o prod cough with moderate white sputum.  saba makes it better x a few hours ,  Much better p prednisone in past, not better on advair.  p saba only sob with more than slow adls, before saba sob at rest rec Symbicort Take 2 puffs first thing in am and then another 2 puffs about 12 hours later.  Prednisone 10 mg take  4 each am x 2 days,   2 each am x 2 days,  1 each am x 2 days and stop  Only use your albuterol (ventolin) as a rescue medication Work on  inhaler technique:  The key is to stop smoking completely before smoking completely stops you!       01/22/2015 acute extended re-establish ov/Madalen Gavin re:  AB/ still smoking  Chief Complaint  Patient presents with  . Pulmonary Consult    pt last seen in 2014. pt states DR. Green wanted her to follow up. pt states somethines her throat feels like it closes up and she cant breath. pt states she feels like she has to take really deep breaths. pt c/o chest tightness, and prod cough white in color.pt c/o thrush she thiks may come from the inhailers.     Last better breathing  x 4 weeks prior to OV  p using prednisone but benefit only for a few days p stopped despite maint rx with symbicort (though hfa poor)  Nl routine is symbicort 160 2 bid and maybe ventolin and occ brovana no recent duoneb need  In retrospect really hasn't been able to really stay well x 4 y Cough is harsh and congested worse day than noct    No obvious pattern day to day or daytime variabilty or assoc purulent sputum or  cp or   subjective wheeze overt sinus or hb symptoms. No unusual exp hx or h/o childhood pna/ asthma or knowledge of premature birth.   Also denies any obvious fluctuation of symptoms with weather or environmental changes or other aggravating or alleviating factors except as outlined above   Current Medications, Allergies, Complete Past Medical History, Past Surgical History, Family History, and Social History were reviewed in [facility] record.  ROS  The following are not active complaints unless bolded sore throat, dysphagia, dental problems, itching, sneezing,  nasal congestion or excess/ purulent secretions, ear ache,   fever, chills, sweats, unintended wt loss, pleuritic or exertional cp, hemoptysis,  orthopnea pnd or leg swelling, presyncope, palpitations, heartburn, abdominal pain, anorexia, nausea, vomiting, diarrhea  or change in bowel or urinary habits, change in stools or  urine, dysuria,hematuria,  rash, arthralgias, visual complaints, headache, numbness weakness or ataxia or problems with walking or coordination,  change in mood/affect or memory.           Objective:   Physical Exam GEN: A/Ox3; pleasant , NAD, ambulatory well nourished  -  Congested cough on fvc      01/22/2015      156     01/16/13 156 lb (70.761 kg)  01/08/13 155 lb 3.2 oz (70.398 kg)  12/20/12 157 lb 9.6 oz (71.487 kg)      HEENT:  [place]/AT,  EACs-clear, TMs-wnl, NOSE-clear, THROAT-clear, no lesions, no postnasal drip or exudate noted.   NECK:  Supple w/ fair ROM;  no JVD; normal carotid impulses w/o bruits; no thyromegaly or nodules palpated; no lymphadenopathy.  RESP  Pan exp sonorous rhonchi sym bilaterally  CARD:  RRR, no m/r/g  , no peripheral edema, pulses intact, no cyanosis or clubbing.  GI:   Soft & nt; nml bowel sounds; no organomegaly or masses detected.  Musco: Warm bil, no deformities or joint swelling noted.   Neuro: alert, no focal deficits noted.    Skin: Warm, no lesions or rashes      I personally reviewed images and agree with radiology impression as follows:  CXR: 12/30/14   No active disease.       Assessment & Plan:

## 2015-02-04 ENCOUNTER — Telehealth: Payer: Self-pay | Admitting: Internal Medicine

## 2015-02-04 NOTE — Telephone Encounter (Signed)
lmomtcb x1 

## 2015-02-04 NOTE — Telephone Encounter (Signed)
Would make absolutely sure it's gone for at least 2 weeks with no cough meds or urge to even clear her throat before tapering it off.

## 2015-02-04 NOTE — Telephone Encounter (Signed)
Spoke with the pt  She states her cough is much improved  She is asking if she needs to continue to take prilosec and pepcid  Pt aware MW out of the office until next wk and okay with waiting until then for response

## 2015-02-05 NOTE — Telephone Encounter (Signed)
lmomtcb x 2  

## 2015-02-05 NOTE — Telephone Encounter (Signed)
Patient notified of Dr. Gustavus Bryant recommendations. Patient verbalized understanding. Nothing further needed. Closing encounter

## 2015-02-05 NOTE — Telephone Encounter (Signed)
[phone], pt cb

## 2015-02-10 ENCOUNTER — Telehealth: Payer: Self-pay

## 2015-02-10 NOTE — Telephone Encounter (Signed)
Fax received indicating overnight oximetry was cancelled because patient refused. Fax was placed on Dr.Green's ledge for review.

## 2015-03-08 ENCOUNTER — Ambulatory Visit (INDEPENDENT_AMBULATORY_CARE_PROVIDER_SITE_OTHER): Payer: Medicare PPO | Admitting: Internal Medicine

## 2015-03-08 ENCOUNTER — Encounter: Payer: Self-pay | Admitting: Internal Medicine

## 2015-03-08 VITALS — BP 122/82 | HR 81 | Ht 65.0 in | Wt 162.0 lb

## 2015-03-08 DIAGNOSIS — Z720 Tobacco use: Secondary | ICD-10-CM | POA: Diagnosis not present

## 2015-03-08 DIAGNOSIS — F17210 Nicotine dependence, cigarettes, uncomplicated: Secondary | ICD-10-CM

## 2015-03-08 DIAGNOSIS — J45909 Unspecified asthma, uncomplicated: Secondary | ICD-10-CM

## 2015-03-08 DIAGNOSIS — R0600 Dyspnea, unspecified: Secondary | ICD-10-CM | POA: Diagnosis not present

## 2015-03-08 DIAGNOSIS — J449 Chronic obstructive pulmonary disease, unspecified: Secondary | ICD-10-CM | POA: Diagnosis not present

## 2015-03-08 LAB — PULMONARY FUNCTION TEST
DL/VA % PRED: 72 %
DL/VA: 3.58 ml/min/mmHg/L
DLCO UNC: 16.13 ml/min/mmHg
DLCO unc % pred: 63 %
FEF 25-75 POST: 1.67 L/s
FEF 25-75 Pre: 1.26 L/sec
FEF2575-%Change-Post: 32 %
FEF2575-%PRED-POST: 86 %
FEF2575-%Pred-Pre: 65 %
FEV1-%CHANGE-POST: 5 %
FEV1-%PRED-PRE: 74 %
FEV1-%Pred-Post: 78 %
FEV1-Post: 1.85 L
FEV1-Pre: 1.74 L
FEV1FVC-%Change-Post: -1 %
FEV1FVC-%PRED-PRE: 97 %
FEV6-%Change-Post: 6 %
FEV6-%Pred-Post: 84 %
FEV6-%Pred-Pre: 79 %
FEV6-Post: 2.5 L
FEV6-Pre: 2.35 L
FEV6FVC-%Change-Post: 0 %
FEV6FVC-%Pred-Post: 103 %
FEV6FVC-%Pred-Pre: 104 %
FVC-%Change-Post: 8 %
FVC-%PRED-POST: 81 %
FVC-%PRED-PRE: 75 %
FVC-POST: 2.53 L
FVC-PRE: 2.35 L
POST FEV1/FVC RATIO: 73 %
PRE FEV6/FVC RATIO: 100 %
Post FEV6/FVC ratio: 100 %
Pre FEV1/FVC ratio: 74 %
RV % pred: 118 %
RV: 2.67 L
TLC % pred: 99 %
TLC: 5.16 L

## 2015-03-08 NOTE — Progress Notes (Signed)
PFT done today. 

## 2015-03-08 NOTE — Progress Notes (Signed)
Subjective:    Patient ID: Gabrielle White, female    DOB: 08/02/1945 .   MRN: [id number]  HPI   Ramaswamy note IOV 11/10/2010:  Smoker. Referred by Dr. Posey Pronto for possible copd.  Cc insidious onset chronic cough for few years. Slowly progressive. Moderate in severity. Associated thin white sputum + of small amounts present. Associated dogs, cats and horses present but she is not sure that this makes cough worse though she feels that the house she has lived in for past 5 years might be related to cough onset though she does not know what. States house is clean though dog hair present. Cough made worse occ by lying down and cig smoke but not always. Cough improved by a 5 day prednisone course in end June 2012 when she went to ER for acute dyspnea. Of note, Kouffman Reflux Symptom Index Score (RSI) is 11 and therefore against dx of LPR.   Also, reports 1  Year chronic dyspnea of insidious onset. Heat and activity makes dyspnea worse. Walking dogs 1/2 mile is no problem but any heavier activity more than walking makes her more dyspneic. Also, progressive. Rates dyspnea as moderate. Improved by rest  On   09/25/2010 wennt to ER with acute dyspnea after being in horse stall - ddimer, ck, ekg, trop (reviewed) all normal. CXR showed emphysema and was sent home on 5 day prednisone that helped immensely ("oh yeah"). Note, did not desaturate walking 185 feet x 3 laps in office Ongoing tobacco abuse. Quit for 15 years at age 69. 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 Interested in quitting smoking. Has taken chantix in past but does not remember if any problems with it. Denies associated chest pain, gerd, edema, sputum, inhaler intake but noted to have WC 11.5k - 15K past 2 months on labs. Heme cx pending per hx oday  . She continues to smoke. We discussed several options for smoking cesstation w/ pt education . She is maintained on Symbicort and tolerating well with no  increased use of SABA .  w/u PFTs 11/18/10: FEV1 of 1.91 L (90%), ratio of 73, DLCO  75% No sign change with SABA rec stop smoking     01/16/2013  Acute  ov/Keyvin Rison re: sob/ AB  Chief Complaint  Patient presents with  . Acute Visit    Pt c/o increased SOB for the past month.  She states that she gets SOB when at night when she lies down and when she first wakes up in the am.  She has been using her rescue inhaler approx 4 times per day. She also c/o prod cough with moderate white sputum.  saba makes it better x a few hours ,  Much better p prednisone in past, not better on advair.  p saba only sob with more than slow adls, before saba sob at rest rec Symbicort Take 2 puffs first thing in am and then another 2 puffs about 12 hours later.  Prednisone 10 mg take  4 each am x 2 days,   2 each am x 2 days,  1 each am x 2 days and stop  Only use your albuterol (ventolin) as a rescue medication Work on inhaler technique:  The key is to stop smoking completely before smoking completely stops you!       01/22/2015 acute extended re-establish ov/Meah Jiron re:  AB/ still smoking  Chief Complaint  Patient presents with  . Pulmonary Consult  pt last seen in 2014. pt states DR. Green wanted her to follow up. pt states somethines her throat feels like it closes up and she cant breath. pt states she feels like she has to take really deep breaths. pt c/o chest tightness, and prod cough white in color.pt c/o thrush she thiks may come from the inhailers.     Last better breathing  x 4 weeks prior to OV  p using prednisone but benefit only for a few days p stopped despite maint rx with symbicort (though hfa poor)  Nl routine is symbicort 160 2 bid and maybe ventolin and occ brovana no recent duoneb need  In retrospect really hasn't been able to really stay well x 4 y Cough is harsh and congested worse day than noct  rec Start Prednisone 10mg  Take 4 for two days three for two days two for two days one for two  days  Plan A =  Automatic = symbicort 160 Take 2 puffs first thing in am and then another 2 puffs about 12 hours later.  Work on Interior and spatial designer: Plan B = Backup  Only use your albuterol  Plan C = crisis  Only use nebulizer iprotropium-albuterol (duoneb) if you try the ventolin first and it fails to help > ok to use up to every 4 hours  Plan D = doctor call us if not improving  Plan E = ER > go there if all else fails Try prilosec otc 20mg   Take 30-60 min before first meal of the day and Pepcid ac (famotidine) 20 mg one @  bedtime    GERD (REFLUX) diet      03/08/2015  f/u ov/Jamielee Mchale re: COPD/ AB  GOLD 0/ still smoking  Chief Complaint  Patient presents with  . Follow-up    PFT done today. Pt states that her breathing has improved greatly. She is able to do more activities. Pt c/o occasional cough that quickly resolves and occasional dyspnea with exertion. Pt denies wheeze/CP/tightness.      No obvious pattern day to day or daytime variabilty or assoc purulent sputum or  cp or   subjective wheeze overt sinus or hb symptoms. No unusual exp hx or h/o childhood pna/ asthma or knowledge of premature birth.   Also denies any obvious fluctuation of symptoms with weather or environmental changes or other aggravating or alleviating factors except as outlined above   Current Medications, Allergies, Complete Past Medical History, Past Surgical History, Family History, and Social History were reviewed in [facility] record.  ROS  The following are not active complaints unless bolded sore throat, dysphagia, dental problems, itching, sneezing,  nasal congestion or excess/ purulent secretions, ear ache,   fever, chills, sweats, unintended wt loss, pleuritic or exertional cp, hemoptysis,  orthopnea pnd or leg swelling, presyncope, palpitations, heartburn, abdominal pain, anorexia, nausea, vomiting, diarrhea  or change in bowel or urinary habits, change in stools or  urine, dysuria,hematuria,  rash, arthralgias, visual complaints, headache, numbness weakness or ataxia or problems with walking or coordination,  change in mood/affect or memory.           Objective:   Physical Exam GEN: A/Ox3; pleasant , NAD, ambulatory well nourished  Still some smoker's rattle    01/22/2015      156 > 03/08/2015   162     01/16/13 156 lb (70.761 kg)  01/08/13 155 lb 3.2 oz (70.398 kg)  12/20/12 157 lb 9.6 oz (71.487 kg)  HEENT:  [place]/AT,  EACs-clear, TMs-wnl, NOSE-clear, THROAT-clear, no lesions, no postnasal drip or exudate noted.   NECK:  Supple w/ fair ROM; no JVD; normal carotid impulses w/o bruits; no thyromegaly or nodules palpated; no lymphadenopathy.  RESP   Completely clear bilaterally   CARD:  RRR, no m/r/g  , no peripheral edema, pulses intact, no cyanosis or clubbing.  GI:   Soft & nt; nml bowel sounds; no organomegaly or masses detected.  Musco: Warm bil, no deformities or joint swelling noted.   Neuro: alert, no focal deficits noted.    Skin: Warm, no lesions or rashes      I personally reviewed images and agree with radiology impression as follows:  CXR: 12/30/14   No active disease.       Assessment & Plan:

## 2015-03-08 NOTE — Patient Instructions (Signed)
The key is to stop smoking completely before smoking completely stops you!   Ok to stop the pm dose of symbicort 160 and add back if any problems  Please schedule a follow up visit in 3 months but call sooner if needed

## 2015-03-09 NOTE — Assessment & Plan Note (Addendum)
pfts 11/18/10  FEV1  2.0 (95%) ratio 73 and no change p saba and DLCO 75 corrects to 100%    - 03/08/2015  extensive coaching HFA effectiveness =    90% from a baseline of 75 %  -PFT's  03/08/2015  FEV1 1.85 (78 % ) ratio 73  p 5 % improvement from saba with DLCO  63 % corrects to 72 % for alv volume and erv 17    I had an extended  summary discussion with the patient reviewing all relevant studies completed to date and  lasting 15 to 20 minutes of a 25 minute visit on the following issues:    1) still does not meet criteria for copd though suspect she'll eventually develop it if continues to smoke (see sep a/p)  2) has typical smoker's cough  3) now that that hfa so good ok to cut back to qam symbicort as c/o too expensive  4) Each maintenance medication was reviewed in detail including most importantly the difference between maintenance and as needed and under what circumstances the prns are to be used.  Please see instructions for details which were reviewed in writing and the patient given a copy.    5) pulmonary  f/u can be q 3 m

## 2015-03-09 NOTE — Assessment & Plan Note (Signed)

## 2015-03-10 ENCOUNTER — Ambulatory Visit (INDEPENDENT_AMBULATORY_CARE_PROVIDER_SITE_OTHER): Payer: Medicare PPO | Admitting: Internal Medicine

## 2015-03-10 ENCOUNTER — Encounter: Payer: Self-pay | Admitting: Internal Medicine

## 2015-03-10 VITALS — BP 130/82 | HR 87 | Temp 98.0°F | Resp 20 | Ht 65.0 in | Wt 163.0 lb

## 2015-03-10 DIAGNOSIS — J449 Chronic obstructive pulmonary disease, unspecified: Secondary | ICD-10-CM | POA: Diagnosis not present

## 2015-03-10 DIAGNOSIS — Z720 Tobacco use: Secondary | ICD-10-CM

## 2015-03-10 DIAGNOSIS — E039 Hypothyroidism, unspecified: Secondary | ICD-10-CM

## 2015-03-10 DIAGNOSIS — F411 Generalized anxiety disorder: Secondary | ICD-10-CM

## 2015-03-10 DIAGNOSIS — B370 Candidal stomatitis: Secondary | ICD-10-CM | POA: Diagnosis not present

## 2015-03-10 DIAGNOSIS — J45909 Unspecified asthma, uncomplicated: Secondary | ICD-10-CM

## 2015-03-10 DIAGNOSIS — J441 Chronic obstructive pulmonary disease with (acute) exacerbation: Secondary | ICD-10-CM | POA: Diagnosis not present

## 2015-03-10 DIAGNOSIS — R739 Hyperglycemia, unspecified: Secondary | ICD-10-CM | POA: Diagnosis not present

## 2015-03-10 DIAGNOSIS — Z23 Encounter for immunization: Secondary | ICD-10-CM | POA: Diagnosis not present

## 2015-03-10 DIAGNOSIS — I10 Essential (primary) hypertension: Secondary | ICD-10-CM

## 2015-03-10 DIAGNOSIS — F17210 Nicotine dependence, cigarettes, uncomplicated: Secondary | ICD-10-CM

## 2015-03-10 DIAGNOSIS — E785 Hyperlipidemia, unspecified: Secondary | ICD-10-CM

## 2015-03-10 MED ORDER — ATORVASTATIN CALCIUM 40 MG PO TABS: ORAL_TABLET | ORAL | Status: DC

## 2015-03-10 MED ORDER — LEVOTHYROXINE SODIUM 75 MCG PO TABS: ORAL_TABLET | ORAL | Status: DC

## 2015-03-10 MED ORDER — ALBUTEROL SULFATE HFA 108 (90 BASE) MCG/ACT IN AERS: INHALATION_SPRAY | RESPIRATORY_TRACT | Status: DC

## 2015-03-10 MED ORDER — ALPRAZOLAM 0.5 MG PO TABS: ORAL_TABLET | ORAL | Status: DC

## 2015-03-10 MED ORDER — MAGIC MOUTHWASH: ORAL | Status: DC

## 2015-03-10 MED ORDER — LOSARTAN POTASSIUM 50 MG PO TABS: ORAL_TABLET | ORAL | Status: DC

## 2015-03-10 NOTE — Progress Notes (Signed)
Patient ID: Gabrielle White, female   DOB: 1946-02-09, 69 y.o.   MRN: [id number]    Facility  [facility]    Place of Service:   OFFICE    Allergies  Allergen Reactions  . Cafergot   . Codeine   . Fenoprofen Calcium   . Nalfon [Fenoprofen]     Chief Complaint  Patient presents with  . Medical Management of Chronic Issues    2 mo f/u    HPI:  Patient saw Dr. Melvyn Novas, pulmonologist, on 03/08/2015. He believes she has chronic asthmatic bronchitis, but does not meet criteria for COPD based on pulmonary function tests.Marland Kitchen He encouraged her to quit smoking. She remains on Symbicort and DuoNeb nebs every 6 hours. Advised her that she could cut back on the morning Symbicort. She continues to have albuterol inhaler for rescue inhaler.  Medications: Patient's Medications  New Prescriptions   No medications on file  Previous Medications   ALBUTEROL (VENTOLIN HFA) 108 (90 BASE) MCG/ACT INHALER    INHALE TWO PUFFS EVERY 4 HOURS AS NEEDED FOR COUGH OR SHORTNESS OF BREATH.   ALPRAZOLAM (XANAX) 0.5 MG TABLET    One up to twice daily for nervousness or sleep   ATORVASTATIN (LIPITOR) 40 MG TABLET    Take one tablet by mouth once daily for cholesterol   BUDESONIDE-FORMOTEROL (SYMBICORT) 160-4.5 MCG/ACT INHALER    Take 2 puffs first thing in am and then another 2 puffs about 12 hours later.   CHOLECALCIFEROL (VITAMIN D3) 1000 UNITS CAPS    Take 1 capsule by mouth daily.     FAMOTIDINE (PEPCID) 10 MG TABLET    Take 10 mg by mouth at bedtime.   IPRATROPIUM-ALBUTEROL (DUONEB) 0.5-2.5 (3) MG/3ML SOLN    Use every 6 hours to help breathing   LANSOPRAZOLE (PREVACID) 15 MG CAPSULE    Take 15 mg by mouth at bedtime.   LEVOTHYROXINE (SYNTHROID, LEVOTHROID) 75 MCG TABLET    One daily for thyroid supplement   LOSARTAN (COZAAR) 50 MG TABLET    Take one tablet by mouth once daily for blood pressure   MAGIC MOUTHWASH SOLN    [employer] magic mouthwash:  Gargle 2-5 cc 4 times daily for oral yeast infectioin  Modified  Medications   No medications on file  Discontinued Medications   No medications on file    Review of Systems  Constitutional: Positive for fatigue. Negative for fever, chills, diaphoresis, activity change, appetite change and unexpected weight change.  HENT: Positive for sore throat. Negative for congestion, ear discharge, ear pain, hearing loss, postnasal drip, rhinorrhea, tinnitus, trouble swallowing and voice change.   Eyes: Negative.  Negative for pain, redness, itching and visual disturbance.  Respiratory: Positive for cough, shortness of breath and wheezing. Negative for choking.   Cardiovascular: Negative for chest pain, palpitations and leg swelling.  Gastrointestinal: Negative for nausea, vomiting, abdominal pain, diarrhea, constipation and abdominal distention.  Endocrine: Negative for cold intolerance, heat intolerance, polydipsia, polyphagia and polyuria.       History hypothyroidism  Genitourinary: Negative for dysuria, urgency, frequency, hematuria, flank pain, vaginal discharge, difficulty urinating and pelvic pain.       Episodes of urinary incontinence. Improved on Myrbetriq.  Musculoskeletal: Negative for myalgias, back pain, arthralgias, gait problem, neck pain and neck stiffness.  Skin: Negative for color change, pallor and rash.  Allergic/Immunologic: Negative.   Neurological: Positive for dizziness. Negative for tremors, seizures, syncope, speech difficulty, weakness, light-headedness, numbness and headaches.  Hematological: Negative for adenopathy. Does not  bruise/bleed easily.  Psychiatric/Behavioral: Positive for decreased concentration. Negative for suicidal ideas, hallucinations, behavioral problems, confusion, sleep disturbance, dysphoric mood and agitation. The patient is not nervous/anxious and is not hyperactive.        Feeling depressed sometimes.    Filed Vitals:   03/10/15 1143  BP: 130/82  Pulse: 87  Temp: 98 F (36.7 C)  TempSrc: Oral  Resp: 20    Height: '5\' 5"'  (1.651 m)  Weight: 163 lb (73.936 kg)  SpO2: 94%   Body mass index is 27.12 kg/(m^2).  Physical Exam  Constitutional: She is oriented to person, place, and time. She appears well-developed and well-nourished. No distress.  HENT:  Head: Normocephalic and atraumatic.  Right Ear: External ear normal.  Left Ear: External ear normal.  Nose: Nose normal.  yeast plaques on the uvula and soft palate. Erythema of tonsillar crypts bilaterally. No adenopathy.  Eyes: Conjunctivae and EOM are normal. Pupils are equal, round, and reactive to light. Left eye exhibits no discharge.  Corrective lenses.  Neck: Normal range of motion. Neck supple. No JVD present. No tracheal deviation present. No thyromegaly present.  Cardiovascular: Normal rate, regular rhythm, normal heart sounds and intact distal pulses.  Exam reveals no gallop and no friction rub.   No murmur heard. Pulmonary/Chest: She is in respiratory distress. She has wheezes. She has rales. She exhibits tenderness (left lower chest and to a lesser degree right lower chest).  Bilateral bronchial rattle. Partial clearing with cough. Wheezing. Has dyspnea at rest and when talking.  Abdominal: Soft. Bowel sounds are normal. She exhibits no distension and no mass. There is no tenderness.  Genitourinary:  Goes to [facility].  Musculoskeletal: Normal range of motion. She exhibits no edema or tenderness.  Tender at the tip of the coccyx.  Lymphadenopathy:    She has no cervical adenopathy.  Neurological: She is alert and oriented to person, place, and time. No cranial nerve deficit. Coordination normal.  Skin: Skin is warm and dry. No rash noted. She is not diaphoretic. No erythema. No pallor.   lipoma of the right greater trochanter with some scarring around it secondary to previous removals. Small scar under right ribs from prior boil.  Psychiatric: She has a normal mood and affect. Her behavior is normal. Thought content  normal.    Labs reviewed: Lab Summary Latest Ref Rng 10/26/2014  Hemoglobin 11.1 - 15.9 g/dL 15.3  Hematocrit 34.0 - 46.6 % 46.5  White count 3.4 - 10.8 x10E3/uL 8.4  Platelet count 150 - 379 x10E3/uL 274  Sodium 134 - 144 mmol/L 140  Potassium 3.5 - 5.2 mmol/L 4.2  Calcium 8.7 - 10.3 mg/dL 9.3  Phosphorus - (None)  Creatinine 0.57 - 1.00 mg/dL 0.81  AST 0 - 40 IU/L 13  Alk Phos 39 - 117 IU/L 110  Bilirubin 0.0 - 1.2 mg/dL 0.4  Glucose 65 - 99 mg/dL 96  Cholesterol - (None)  HDL cholesterol >39 mg/dL 52  Triglycerides 0 - 149 mg/dL 171(H)  LDL Direct - (None)  LDL Calc 0 - 99 mg/dL 82  Total protein - (None)  Albumin 3.5 - 4.8 g/dL 4.1   Lab Results  Component Value Date   TSH 7.740* 10/26/2014   Lab Results  Component Value Date   BUN 10 10/26/2014   Lab Results  Component Value Date   HGBA1C 5.9* 06/17/2013    Assessment/Plan  1. Asthmatic bronchitis , chronic ([place]) Continues with a rattling cough, but reviews problems of dyspnea  have improved.  2. Essential hypertension - losartan (COZAAR) 50 MG tablet; Take one tablet by mouth once daily for blood pressure  Dispense: 90 tablet; Refill: 3 - Comprehensive metabolic panel; Future  3. Hyperglycemia - Comprehensive metabolic panel; Future  4. Hyperlipidemia - atorvastatin (LIPITOR) 40 MG tablet; Take one tablet by mouth once daily for cholesterol  Dispense: 90 tablet; Refill: 1  5. Hypothyroidism, unspecified hypothyroidism type - levothyroxine (SYNTHROID, LEVOTHROID) 75 MCG tablet; One daily for thyroid supplement  Dispense: 90 tablet; Refill: 3 - TSH; Future  6. Chronic obstructive airway disease with asthma ([place]) This problem will be removed from the list. Dr. Melvyn Novas does not feel that she qualifies for the diagnosis based on recent pulmonary function tests.  7. Cigarette smoker 15 minute discussion regarding stopping  8. Bronchitis, chronic obstructive, with exacerbation (HCC) - albuterol (VENTOLIN  HFA) 108 (90 BASE) MCG/ACT inhaler; INHALE TWO PUFFS EVERY 4 HOURS AS NEEDED FOR COUGH OR SHORTNESS OF BREATH.  Dispense: 18 each; Refill: 3  9. Anxiety state - ALPRAZolam (XANAX) 0.5 MG tablet; One up to twice daily for nervousness or sleep  Dispense: 180 tablet; Refill: 2  10. Moniliasis of mouth - magic mouthwash SOLN; [employer] magic mouthwash:  Gargle 2-5 cc 4 times daily for oral yeast infectioin  Dispense: 150 mL; Refill: 1  11. Need for prophylactic vaccination and inoculation against influenza - Flu Vaccine QUAD 36+ mos PF IM (Fluarix & Fluzone Quad PF)

## 2015-04-23 ENCOUNTER — Other Ambulatory Visit: Payer: Self-pay | Admitting: Internal Medicine

## 2015-05-27 ENCOUNTER — Telehealth: Payer: Self-pay

## 2015-05-27 DIAGNOSIS — E039 Hypothyroidism, unspecified: Secondary | ICD-10-CM

## 2015-05-27 NOTE — Telephone Encounter (Signed)
Refill was received for Levothyroxine 50 mcg, patient's current medications list has patient taken 50 mcg and 75 mcg, I need to clarify dose

## 2015-05-28 MED ORDER — LEVOTHYROXINE SODIUM 75 MCG PO TABS: ORAL_TABLET | ORAL | Status: DC

## 2015-05-28 NOTE — Telephone Encounter (Signed)
Left message on voicemail for patient to return call when available   

## 2015-05-28 NOTE — Telephone Encounter (Signed)
Spoke with patient, patient is currently taking 50 mcg, based on last labs 10/26/14 and OV 10/28/14 patient is suppose to be on 75 mcg. Patient is now aware that she needs to make this change. I will send rx for 75 mcg to the pharmacy and remove 50 mcg from medication list.

## 2015-06-07 ENCOUNTER — Encounter: Payer: Self-pay | Admitting: Internal Medicine

## 2015-06-07 ENCOUNTER — Ambulatory Visit (INDEPENDENT_AMBULATORY_CARE_PROVIDER_SITE_OTHER): Payer: Medicare Other | Admitting: Internal Medicine

## 2015-06-07 VITALS — BP 142/98 | HR 72 | Ht 65.0 in | Wt 170.2 lb

## 2015-06-07 DIAGNOSIS — Z720 Tobacco use: Secondary | ICD-10-CM

## 2015-06-07 DIAGNOSIS — F17210 Nicotine dependence, cigarettes, uncomplicated: Secondary | ICD-10-CM

## 2015-06-07 DIAGNOSIS — J449 Chronic obstructive pulmonary disease, unspecified: Secondary | ICD-10-CM

## 2015-06-07 DIAGNOSIS — J45909 Unspecified asthma, uncomplicated: Secondary | ICD-10-CM

## 2015-06-07 MED ORDER — FLUTTER DEVI: Status: DC

## 2015-06-07 NOTE — Assessment & Plan Note (Signed)
>   3 min Discussed the risks and costs (both direct and indirect)  of smoking relative to the benefits of quitting but patient unwilling to commit at this point to a specific quit date.   Follow up per Primary Care planned     

## 2015-06-07 NOTE — Patient Instructions (Addendum)
mucinex or mucinex dm up to 1200 mg every 12 hours and use the flutter as much as you can  Ok to try off the acid suppression to see what difference if any this makes   GERD (REFLUX)  is an extremely common cause of respiratory symptoms just like yours , many times with no obvious heartburn at all.    It can be treated with medication, but also with lifestyle changes including elevation of the head of your bed (ideally with 6 inch  bed blocks),  Smoking cessation, avoidance of late meals, excessive alcohol, and avoid fatty foods, chocolate, peppermint, colas, red wine, and acidic juices such as orange juice.  NO MINT OR MENTHOL PRODUCTS SO NO COUGH DROPS  USE SUGARLESS CANDY INSTEAD (Jolley ranchers or Stover's or Life Savers) or even ice chips will also do - the key is to swallow to prevent all throat clearing. NO OIL BASED VITAMINS - use powdered substitutes.    If you are satisfied with your treatment plan,  let your doctor know and he/she can either refill your medications or you can return here when your prescription runs out.     If in any way you are not 100% satisfied,  please tell us.  If 100% better, tell your friends!  Pulmonary follow up is as needed

## 2015-06-07 NOTE — Assessment & Plan Note (Addendum)
-   PFTs  11/18/10  FEV1  2.0 (95%) ratio 73 and no change p saba and DLCO 75 corrects to 100%    - 03/08/2015  extensive coaching HFA effectiveness =    90% from a baseline of 75 %  -PFT's  03/08/2015  FEV1 1.85 (78 % ) ratio 73  p 5 % improvement from saba with DLCO  63 % corrects to 72 % for alv volume and erv 17    Unfortunately little to offer s commitment to quit smoking > she can certainly use her albuterol more often, up to every 4 hours if she must, and if that doesn't suffice we can call in a short course of prednisone but at this point no active infection so main issue is poor mucociliary > rec add max mucinex/ flutter valve and continue symbicort 160 2bid and work harder on quit smoking (see separate a/p)   Not really clear low dose acid suppression helping any of her resp symptoms so ok with me if she tries off   I had an extended discussion with the patient reviewing all relevant studies completed to date and  lasting 15 to 20 minutes of a 25 minute visit    Each maintenance medication was reviewed in detail including most importantly the difference between maintenance and prns and under what circumstances the prns are to be triggered using an action plan format that is not reflected in the computer generated alphabetically organized AVS.    Please see instructions for details which were reviewed in writing and the patient given a copy highlighting the part that I personally wrote and discussed at today's ov.

## 2015-06-07 NOTE — Progress Notes (Signed)
Subjective:   Patient ID: Gabrielle White, female    DOB: April 14, 1945 .   MRN: [id number]    Brief patient profile:  20 yowf active smoker with AB/GOLD 0 copd by pfts 03/08/15   History of Present Illness  01/22/2015 acute extended re-establish ov/Gabrielle White re:  AB/ still smoking  Chief Complaint  Patient presents with  . Pulmonary Consult    pt last seen in 2014. pt states DR. Green wanted her to follow up. pt states somethines her throat feels like it closes up and she cant breath. pt states she feels like she has to take really deep breaths. pt c/o chest tightness, and prod cough white in color.pt c/o thrush she thiks may come from the inhailers.     Last better breathing  x 4 weeks prior to OV  p using prednisone but benefit only for a few days p stopped despite maint rx with symbicort (though hfa poor)  Nl routine is symbicort 160 2 bid and maybe ventolin and occ brovana no recent duoneb need  In retrospect really hasn't been able to really stay well x 4 y Cough is harsh and congested worse day than noct  rec Start Prednisone 10mg  Take 4 for two days three for two days two for two days one for two days  Plan A =  Automatic = symbicort 160 Take 2 puffs first thing in am and then another 2 puffs about 12 hours later.  Work on Interior and spatial designer: Plan B = Backup  Only use your albuterol  Plan C = crisis  Only use nebulizer iprotropium-albuterol (duoneb) if you try the ventolin first and it fails to help > ok to use up to every 4 hours  Plan D = doctor call us if not improving  Plan E = ER > go there if all else fails Try prilosec otc 20mg   Take 30-60 min before first meal of the day and Pepcid ac (famotidine) 20 mg one @  bedtime    GERD (REFLUX) diet      03/08/2015  f/u ov/Gabrielle White re: COPD/ AB  GOLD 0/ still smoking  Chief Complaint  Patient presents with  . Follow-up    PFT done today. Pt states that her breathing has improved greatly. She is able to do more activities. Pt  c/o occasional cough that quickly resolves and occasional dyspnea with exertion. Pt denies wheeze/CP/tightness.   rec The key is to stop smoking completely before smoking completely stops you!  Ok to stop the pm dose of symbicort 160 and add back if any problems   06/07/2015  f/u ov/Gabrielle White re: GOLD0  Still smoking  / maint rx symbicort 160 2bid and gerd rx  for which she questions need    Chief Complaint  Patient presents with  . Follow-up    Pt c/s continued SOB, cough/wheeze. Pt also c/o some mild stomach/abdominal pain that is not always associated with her breathing problems. Pt states that she has been unable to stop smoking.   on saba qod / mucus is white and thick  Has not tried to increase saba or use mucinex    No obvious pattern day to day or daytime variabilty or assoc purulent sputum or  cp or   subjective wheeze overt sinus or hb symptoms. No unusual exp hx or h/o childhood pna/ asthma or knowledge of premature birth.   Also denies any obvious fluctuation of symptoms with weather or environmental changes or other aggravating or alleviating  factors except as outlined above   Current Medications, Allergies, Complete Past Medical History, Past Surgical History, Family History, and Social History were reviewed in [employer] record.  ROS  The following are not active complaints unless bolded sore throat, dysphagia, dental problems, itching, sneezing,  nasal congestion or excess/ purulent secretions, ear ache,   fever, chills, sweats, unintended wt loss, pleuritic or exertional cp, hemoptysis,  orthopnea pnd or leg swelling, presyncope, palpitations, heartburn, abdominal pain, anorexia, nausea, vomiting, diarrhea  or change in bowel or urinary habits, change in stools or urine, dysuria,hematuria,  rash, arthralgias, visual complaints, headache, numbness weakness or ataxia or problems with walking or coordination,  change in mood/affect or memory.            Objective:   Physical Exam GEN: A/Ox3; pleasant , NAD, ambulatory well nourished  Pos smoker's rattle    01/22/2015      156 > 03/08/2015   162 > 06/07/2015  170     01/16/13 156 lb (70.761 kg)  01/08/13 155 lb 3.2 oz (70.398 kg)  12/20/12 157 lb 9.6 oz (71.487 kg)      HEENT:  [place]/AT,  EACs-clear, TMs-wnl, NOSE-clear, THROAT-clear, no lesions, no postnasal drip or exudate noted.   NECK:  Supple w/ fair ROM; no JVD; normal carotid impulses w/o bruits; no thyromegaly or nodules palpated; no lymphadenopathy.  RESP   Junky exp  > insp bilateral rhonchi   CARD:  RRR, no m/r/g  , no peripheral edema, pulses intact, no cyanosis or clubbing.  GI:   Soft & nt; nml bowel sounds; no organomegaly or masses detected.  Musco: Warm bil, no deformities or joint swelling noted.   Neuro: alert, no focal deficits noted.    Skin: Warm, no lesions or rashes      I personally reviewed images and agree with radiology impression as follows:  CXR: 12/30/14   No active disease       Assessment & Plan:

## 2015-06-27 ENCOUNTER — Other Ambulatory Visit: Payer: Self-pay | Admitting: Nurse Practitioner

## 2015-06-28 NOTE — Telephone Encounter (Signed)
Spoke with patient Sertraline not on medication list, we can't refill it. She has been taking this for years! It was d/c by Dr. Nyoka Cowden on 10/28/14 and started Fetzima. She doesn't remember that. She is still taking the Alprazolam. Will have to ask Dr. Nyoka Cowden about this since he d/c it 10/28/14. She ask for 90 day supply. Route to Dr. Nyoka Cowden

## 2015-06-28 NOTE — Telephone Encounter (Signed)
Left message on voicemail for patient to return call when available, reason for call:  Requested refill is not on medication list.

## 2015-06-29 ENCOUNTER — Other Ambulatory Visit: Payer: Self-pay | Admitting: Internal Medicine

## 2015-06-29 NOTE — Telephone Encounter (Signed)
Rx printed and placed in Dr. Rolly Salter folder for signing.

## 2015-06-29 NOTE — Telephone Encounter (Signed)
It is okay to refill sertraline 100 mg, dispense 90 tablets, take 1 daily for anxiety and depression. 3 refills.

## 2015-07-08 MED ORDER — SERTRALINE HCL 100 MG PO TABS: 100.0000 mg | ORAL_TABLET | Freq: Every day | ORAL | Status: DC

## 2015-07-08 NOTE — Addendum Note (Signed)
Addended by: Despina Hidden on: 07/08/2015 08:42 AM   Modules accepted: Orders

## 2015-07-08 NOTE — Telephone Encounter (Signed)
Resent rx with correct quantity rx sent to pharmacy by e-script

## 2015-08-04 ENCOUNTER — Encounter: Payer: Self-pay | Admitting: Internal Medicine

## 2015-08-23 DIAGNOSIS — A770 Spotted fever due to Rickettsia rickettsii: Secondary | ICD-10-CM | POA: Insufficient documentation

## 2015-08-23 HISTORY — DX: Spotted fever due to Rickettsia rickettsii: A77.0

## 2015-08-27 ENCOUNTER — Telehealth: Payer: Self-pay

## 2015-08-27 ENCOUNTER — Other Ambulatory Visit: Payer: Medicare Other

## 2015-08-27 DIAGNOSIS — E039 Hypothyroidism, unspecified: Secondary | ICD-10-CM

## 2015-08-27 DIAGNOSIS — S30861A Insect bite (nonvenomous) of abdominal wall, initial encounter: Secondary | ICD-10-CM

## 2015-08-27 DIAGNOSIS — I10 Essential (primary) hypertension: Secondary | ICD-10-CM

## 2015-08-27 DIAGNOSIS — W57XXXA Bitten or stung by nonvenomous insect and other nonvenomous arthropods, initial encounter: Secondary | ICD-10-CM

## 2015-08-27 DIAGNOSIS — R739 Hyperglycemia, unspecified: Secondary | ICD-10-CM

## 2015-08-27 NOTE — Telephone Encounter (Signed)
Patient came into the office with orders from Dr. Shepard [facility] office to have blood drawn for RMSF titers and Lyme titers done Dx: [id number]. She has had 4 ticks on her. Put orders into the computer. Fax results to fax [phone]

## 2015-08-28 LAB — COMPREHENSIVE METABOLIC PANEL
A/G RATIO: 2.2 (ref 1.2–2.2)
ALT: 11 IU/L (ref 0–32)
AST: 14 IU/L (ref 0–40)
Albumin: 4.4 g/dL (ref 3.5–4.8)
Alkaline Phosphatase: 119 IU/L — ABNORMAL HIGH (ref 39–117)
BUN/Creatinine Ratio: 14 (ref 12–28)
BUN: 12 mg/dL (ref 8–27)
Bilirubin Total: 0.4 mg/dL (ref 0.0–1.2)
CALCIUM: 9.5 mg/dL (ref 8.7–10.3)
CO2: 25 mmol/L (ref 18–29)
Chloride: 98 mmol/L (ref 96–106)
Creatinine, Ser: 0.88 mg/dL (ref 0.57–1.00)
GFR, EST AFRICAN AMERICAN: 77 mL/min/{1.73_m2} (ref >59)
GFR, EST NON AFRICAN AMERICAN: 67 mL/min/{1.73_m2} (ref >59)
GLOBULIN, TOTAL: 2 g/dL (ref 1.5–4.5)
Glucose: 106 mg/dL — ABNORMAL HIGH (ref 65–99)
POTASSIUM: 4.4 mmol/L (ref 3.5–5.2)
SODIUM: 141 mmol/L (ref 134–144)
Total Protein: 6.4 g/dL (ref 6.0–8.5)

## 2015-08-28 LAB — TSH: TSH: 3.93 u[IU]/mL (ref 0.450–4.500)

## 2015-08-31 LAB — ROCKY MTN SPOTTED FVR ABS PNL(IGG+IGM)
RMSF IGG: NEGATIVE
RMSF IGM: 1.68 {index} — AB (ref 0.00–0.89)

## 2015-08-31 LAB — B. BURGDORFI ANTIBODIES: Lyme IgG/IgM Ab: <0.91 {ISR} (ref 0.00–0.90)

## 2015-09-01 NOTE — Telephone Encounter (Signed)
Left message for patient to call about lab results. RMSF was positive, Dr. Nyoka Cowden wants to know if patient being treated. Asked patient to call back.

## 2015-09-01 NOTE — Telephone Encounter (Signed)
Faxed lab results to Dr. Allyn Kenner .

## 2015-09-01 NOTE — Telephone Encounter (Signed)
Spoke with patient she has been started on Doxycyline. Her lab test was positive for [facility] Spotted Fever and Dr. Nyoka Cowden just wanted to make sure she was on medication for this. I faxed results to Dr. Nevada Crane. We did that lab Dr. Nyoka Cowden wanted done, so I cancelled the lab in June, keep appt July 5th with Dr. Nyoka Cowden. Patient appreciated the call.

## 2015-09-15 ENCOUNTER — Ambulatory Visit: Payer: Medicare PPO | Admitting: Internal Medicine

## 2015-09-20 ENCOUNTER — Other Ambulatory Visit: Payer: Medicare PPO

## 2015-09-22 ENCOUNTER — Ambulatory Visit: Payer: Medicare PPO | Admitting: Internal Medicine

## 2015-09-29 ENCOUNTER — Ambulatory Visit: Payer: Medicare Other | Admitting: Internal Medicine

## 2015-10-01 ENCOUNTER — Other Ambulatory Visit: Payer: Medicare Other

## 2015-10-06 ENCOUNTER — Encounter: Payer: Self-pay | Admitting: Internal Medicine

## 2015-10-06 ENCOUNTER — Ambulatory Visit (INDEPENDENT_AMBULATORY_CARE_PROVIDER_SITE_OTHER): Payer: Medicare Other | Admitting: Internal Medicine

## 2015-10-06 VITALS — BP 126/86 | HR 66 | Temp 98.1°F | Ht 65.0 in | Wt 166.0 lb

## 2015-10-06 DIAGNOSIS — Z720 Tobacco use: Secondary | ICD-10-CM | POA: Diagnosis not present

## 2015-10-06 DIAGNOSIS — G40A09 Absence epileptic syndrome, not intractable, without status epilepticus: Secondary | ICD-10-CM | POA: Diagnosis not present

## 2015-10-06 DIAGNOSIS — J45909 Unspecified asthma, uncomplicated: Secondary | ICD-10-CM | POA: Diagnosis not present

## 2015-10-06 DIAGNOSIS — R739 Hyperglycemia, unspecified: Secondary | ICD-10-CM

## 2015-10-06 DIAGNOSIS — E039 Hypothyroidism, unspecified: Secondary | ICD-10-CM | POA: Diagnosis not present

## 2015-10-06 DIAGNOSIS — A770 Spotted fever due to Rickettsia rickettsii: Secondary | ICD-10-CM

## 2015-10-06 DIAGNOSIS — Z23 Encounter for immunization: Secondary | ICD-10-CM | POA: Diagnosis not present

## 2015-10-06 DIAGNOSIS — R05 Cough: Secondary | ICD-10-CM | POA: Diagnosis not present

## 2015-10-06 DIAGNOSIS — R1011 Right upper quadrant pain: Secondary | ICD-10-CM | POA: Diagnosis not present

## 2015-10-06 DIAGNOSIS — I10 Essential (primary) hypertension: Secondary | ICD-10-CM | POA: Diagnosis not present

## 2015-10-06 DIAGNOSIS — J449 Chronic obstructive pulmonary disease, unspecified: Secondary | ICD-10-CM

## 2015-10-06 DIAGNOSIS — F17210 Nicotine dependence, cigarettes, uncomplicated: Secondary | ICD-10-CM

## 2015-10-06 DIAGNOSIS — R32 Unspecified urinary incontinence: Secondary | ICD-10-CM | POA: Insufficient documentation

## 2015-10-06 DIAGNOSIS — R053 Chronic cough: Secondary | ICD-10-CM

## 2015-10-06 DIAGNOSIS — N393 Stress incontinence (female) (male): Secondary | ICD-10-CM | POA: Diagnosis not present

## 2015-10-06 MED ORDER — PREDNISONE 5 MG (21) PO TBPK: ORAL_TABLET | ORAL | Status: DC

## 2015-10-06 NOTE — Progress Notes (Signed)
Patient ID: Gabrielle White, female   DOB: 07/09/1945, 70 y.o.   MRN: [id number]    Facility  [facility]    Place of Service:   OFFICE    Allergies  Allergen Reactions  . Cafergot   . Codeine   . Fenoprofen Calcium   . Nalfon [Fenoprofen]     Chief Complaint  Patient presents with  . Medical Management of Chronic Issues    6 month medication management on blood pressure, thyroid, hyperglycemia, cholesterol   . Memory Loss    30 minutes at a time off and on  . Immunizations    Prevnar 13 given today    HPI:  Had positive serology for RMSF on 5/26/217. Treated with doxycycline by dermatologist, Dr. Hall. No residual issues  Serum glucose on 08/27/2015 was 106 mg percent.  Absence spell at at work that lasted about 45 min. Sometimes has episodes of losing things and misplacing things. Both parents had Alzheimers. Sometimes has forgotten to feed her horses. She thinks that is due to being in a hurry.  Continues to smoke. Rattling cough persists. She says she will not quit smoking.  Has urine incontinence off and on since last year. Worse if coughing. Leaks at night when sleeping. Has not occurred at work. No dysuria.  Tender in RUQ. Feels full and bloated at times. No Hx GB disease.  Medications: Patient's Medications  New Prescriptions   No medications on file  Previous Medications   ALBUTEROL (VENTOLIN HFA) 108 (90 BASE) MCG/ACT INHALER    INHALE TWO PUFFS EVERY 4 HOURS AS NEEDED FOR COUGH OR SHORTNESS OF BREATH.   ALPRAZOLAM (XANAX) 0.5 MG TABLET    TAKE 1 TABLET BY MOUTH UP TO TWICE DAILY FOR NERVEOUSNESS OR SLEEP   ATORVASTATIN (LIPITOR) 40 MG TABLET    Take one tablet by mouth once daily for cholesterol   BUDESONIDE-FORMOTEROL (SYMBICORT) 160-4.5 MCG/ACT INHALER    Take 2 puffs first thing in am and then another 2 puffs about 12 hours later.   CHOLECALCIFEROL (VITAMIN D3) 1000 UNITS CAPS    Take 1 capsule by mouth daily.     FAMOTIDINE (PEPCID) 10 MG TABLET    Take 10 mg  by mouth at bedtime.   LANSOPRAZOLE (PREVACID) 15 MG CAPSULE    Take 15 mg by mouth at bedtime.   LEVOTHYROXINE (SYNTHROID, LEVOTHROID) 75 MCG TABLET    One daily for thyroid supplement   LOSARTAN (COZAAR) 50 MG TABLET    Take one tablet by mouth once daily for blood pressure   MAGIC MOUTHWASH SOLN    [employer] magic mouthwash:  Gargle 2-5 cc 4 times daily for oral yeast infectioin   RESPIRATORY THERAPY SUPPLIES (FLUTTER) DEVI    Use as directed   SERTRALINE (ZOLOFT) 100 MG TABLET    Take 1 tablet (100 mg total) by mouth daily.  Modified Medications   No medications on file  Discontinued Medications   IPRATROPIUM-ALBUTEROL (DUONEB) 0.5-2.5 (3) MG/3ML SOLN    Use every 6 hours to help breathing    Review of Systems  Constitutional: Positive for fatigue. Negative for fever, chills, diaphoresis, activity change, appetite change and unexpected weight change.  HENT: Positive for sore throat. Negative for congestion, ear discharge, ear pain, hearing loss, postnasal drip, rhinorrhea, tinnitus, trouble swallowing and voice change.   Eyes: Negative.  Negative for pain, redness, itching and visual disturbance.  Respiratory: Positive for cough, shortness of breath and wheezing. Negative for choking.          Tobacco abuse  Cardiovascular: Negative for chest pain, palpitations and leg swelling.  Gastrointestinal: Negative for nausea, vomiting, abdominal pain, diarrhea, constipation and abdominal distention.  Endocrine: Negative for cold intolerance, heat intolerance, polydipsia, polyphagia and polyuria.       History hypothyroidism  Genitourinary: Negative for dysuria, urgency, frequency, hematuria, flank pain, vaginal discharge, difficulty urinating and pelvic pain.       Episodes of urinary incontinence. Improved on Myrbetriq.  Musculoskeletal: Negative for myalgias, back pain, arthralgias, gait problem, neck pain and neck stiffness.  Skin: Negative for color change, pallor and rash.    Allergic/Immunologic: Negative.   Neurological: Positive for dizziness. Negative for tremors, seizures, syncope, speech difficulty, weakness, light-headedness, numbness and headaches.       Absence spells  Hematological: Negative for adenopathy. Does not bruise/bleed easily.  Psychiatric/Behavioral: Positive for decreased concentration. Negative for suicidal ideas, hallucinations, behavioral problems, confusion, sleep disturbance, dysphoric mood and agitation. The patient is not nervous/anxious and is not hyperactive.        Feeling depressed sometimes.    Filed Vitals:   10/06/15 1426  BP: 126/86  Pulse: 66  Temp: 98.1 F (36.7 C)  TempSrc: Oral  Height: 5' 5" (1.651 m)  Weight: 166 lb (75.297 kg)  SpO2: 94%   Body mass index is 27.62 kg/(m^2). Filed Weights   10/06/15 1426  Weight: 166 lb (75.297 kg)     Physical Exam  Constitutional: She is oriented to person, place, and time. She appears well-developed and well-nourished. No distress.  HENT:  Head: Normocephalic and atraumatic.  Right Ear: External ear normal.  Left Ear: External ear normal.  Nose: Nose normal.  yeast plaques on the uvula and soft palate. Erythema of tonsillar crypts bilaterally. No adenopathy.  Eyes: Conjunctivae and EOM are normal. Pupils are equal, round, and reactive to light. Left eye exhibits no discharge.  Corrective lenses.  Neck: Normal range of motion. Neck supple. No JVD present. No tracheal deviation present. No thyromegaly present.  Cardiovascular: Normal rate, regular rhythm, normal heart sounds and intact distal pulses.  Exam reveals no gallop and no friction rub.   No murmur heard. Pulmonary/Chest: She is in respiratory distress. She has wheezes. She has rales. She exhibits tenderness (left lower chest and to a lesser degree right lower chest).  Bilateral bronchial rattle. Partial clearing with cough. Wheezing. Has dyspnea at rest and when talking.  Abdominal: Soft. Bowel sounds are  normal. She exhibits no distension and no mass. There is no tenderness.  Genitourinary:  Goes to [facility].  Musculoskeletal: Normal range of motion. She exhibits no edema or tenderness.  Tender at the tip of the coccyx.  Lymphadenopathy:    She has no cervical adenopathy.  Neurological: She is alert and oriented to person, place, and time. No cranial nerve deficit. Coordination normal.  Skin: Skin is warm and dry. No rash noted. She is not diaphoretic. No erythema. No pallor.   lipoma of the right greater trochanter with some scarring around it secondary to previous removals. Small scar under right ribs from prior boil.  Psychiatric: She has a normal mood and affect. Her behavior is normal. Thought content normal.    Labs reviewed: Lab Summary Latest Ref Rng 08/27/2015 10/26/2014  Hemoglobin 11.1 - 15.9 g/dL (None) 15.3  Hematocrit 34.0 - 46.6 % (None) 46.5  White count 3.4 - 10.8 x10E3/uL (None) 8.4  Platelet count 150 - 379 x10E3/uL (None) 274  Sodium 134 - 144 mmol/L 141 140  Potassium 3.5 -   5.2 mmol/L 4.4 4.2  Calcium 8.7 - 10.3 mg/dL 9.5 9.3  Phosphorus - (None) (None)  Creatinine 0.57 - 1.00 mg/dL 0.88 0.81  AST 0 - 40 IU/L 14 13  Alk Phos 39 - 117 IU/L 119(H) 110  Bilirubin 0.0 - 1.2 mg/dL 0.4 0.4  Glucose 65 - 99 mg/dL 106(H) 96  Cholesterol - (None) (None)  HDL cholesterol >39 mg/dL (None) 52  Triglycerides 0 - 149 mg/dL (None) 171(H)  LDL Direct - (None) (None)  LDL Calc 0 - 99 mg/dL (None) 82  Total protein - (None) (None)  Albumin 3.5 - 4.8 g/dL 4.4 4.1   Lab Results  Component Value Date   TSH 3.930 08/27/2015   TSH 7.740* 10/26/2014   TSH 5.210* 12/10/2013   Lab Results  Component Value Date   BUN 12 08/27/2015   BUN 10 10/26/2014   BUN 11 12/10/2013   Lab Results  Component Value Date   HGBA1C 5.9* 06/17/2013    Assessment/Plan  1. Need for vaccination with 13-polyvalent pneumococcal conjugate vaccine - Pneumococcal conjugate vaccine  13-valent  2. RMSF [facility] spotted fever) Resolved  3. Absence attack ([place]) Uncertain etiology. Possible seizure. Possible vascular disease. Before our further testing, would appreciate neurology opinion. Patient worries about her family history of Alzheimer's disease in both parents. - Ambulatory referral to Neurosurgery  4. Asthmatic bronchitis , chronic (HCC) Prednisone Dosepak when necessary  5. Chronic cough  Related to chronic bronchitis and tobacco abuse  6. Hyperglycemia Mild elevation of glucose to 106 on recent lab  7. Hypothyroidism, unspecified hypothyroidism type Stable  8. Essential hypertension Controlled  9. Cigarette smoker Advise stopping smoking. Patient is not receptive to this advice.  10. Stress incontinence Urinalysis and culture  11. RUQ pain - US Abdomen Complete; Future

## 2015-10-07 LAB — URINALYSIS
BILIRUBIN UA: NEGATIVE
Glucose, UA: NEGATIVE
Ketones, UA: NEGATIVE
NITRITE UA: NEGATIVE
PH UA: 6.5 (ref 5.0–7.5)
Protein, UA: NEGATIVE
RBC, UA: NEGATIVE
Specific Gravity, UA: 1.01 (ref 1.005–1.030)
UUROB: 0.2 mg/dL (ref 0.2–1.0)

## 2015-10-08 LAB — URINE CULTURE

## 2015-10-18 ENCOUNTER — Other Ambulatory Visit: Payer: Medicare Other

## 2015-10-19 ENCOUNTER — Ambulatory Visit
Admission: RE | Admit: 2015-10-19 | Discharge: 2015-10-19 | Disposition: A | Discharge status: Home / Self Care | Payer: Medicare Other | Source: Ambulatory Visit | Attending: Internal Medicine | Admitting: Internal Medicine

## 2015-10-19 ENCOUNTER — Other Ambulatory Visit: Payer: Self-pay | Admitting: *Deleted

## 2015-10-19 DIAGNOSIS — R1011 Right upper quadrant pain: Secondary | ICD-10-CM

## 2015-10-19 MED ORDER — OXYBUTYNIN CHLORIDE 5 MG PO TABS: 5.0000 mg | ORAL_TABLET | Freq: Every day | ORAL | Status: DC

## 2015-10-20 ENCOUNTER — Other Ambulatory Visit: Payer: Self-pay | Admitting: Internal Medicine

## 2015-10-25 ENCOUNTER — Ambulatory Visit (INDEPENDENT_AMBULATORY_CARE_PROVIDER_SITE_OTHER): Payer: Medicare Other | Admitting: Neurology

## 2015-10-25 ENCOUNTER — Encounter: Payer: Self-pay | Admitting: Neurology

## 2015-10-25 VITALS — BP 150/72 | HR 78 | Resp 16 | Ht 65.0 in | Wt 164.0 lb

## 2015-10-25 DIAGNOSIS — G454 Transient global amnesia: Secondary | ICD-10-CM

## 2015-10-25 DIAGNOSIS — R6889 Other general symptoms and signs: Secondary | ICD-10-CM | POA: Diagnosis not present

## 2015-10-25 NOTE — Progress Notes (Signed)
Subjective:    Patient ID: Gabrielle White is a 70 y.o. female.  HPI     Star Age, MD, PhD [facility] [address] [place], [place] [address]  Dear Dr. Nyoka Cowden,   I saw your patient, Gabrielle White, upon your kind request in my neurologic clinic today for initial consultation of her recent spell of losing time of her decreased attentiveness, memory loss. The patient is unaccompanied today. As you know, Gabrielle White is a 70 year old right-handed woman with an underlying medical history of chronic lung disease, ongoing smoking, hyperglycemia, hypothyroidism, hypertension, recent diagnosis of RMSF, which was treated with antibiotics, depression, palpitations, osteoarthritis, and overweight state, who reports 2 episodes of losing time and having no recollection of this later. First one happened about a year ago. She states that she was on the phone talking to one of her friends and was told that she had already called her and told her all of the things that she talked about. The patient had woken up from a nap and made a phone call. She did not remember having made a previous phone call of the same nature. Of note, she lives alone. She has limited her driving to local driving, daylight hours, no long-distance driving. The second episode happened recently about a month ago at work. 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 In addition, she works with a company called [employer]. She has to sit at the computer and score papers. Typically she works in the months of February through July. She says that she was at work and lost about 35 minutes of time, apparently she was still working through this time and had no alteration of consciousness and that she did not collapse, no falls, no convulsions were reported. She went to lunch with one of her colleagues and mention that she was not aware of previous half hour or so. She has no prior history of  seizure disorder, no febrile seizure as a child. She does report memory loss and forgetfulness, family history of Alzheimer's disease. She has been seeing Dr. Melvyn Novas in pulmonology and has repeatedly been advised to quit smoking by pulmonology and by you. She is not very motivated to quit smoking. She drinks caffeine in the form of coffee, 2-3 cups in the mornings and ice tea throughout the day. She does not drink very much water. She does not drink any alcohol. She smokes one pack per day. She has one grown son. She has 2 dogs and 1 cat at the house.  She reports no TIA type symptoms, no one-sided weakness, numbness, tingling, slurring of speech or droopy face.  I reviewed your office note from 10/06/2015.  Her Past Medical History Is Significant For: Past Medical History:  Diagnosis Date  . Chronic airway obstruction, not elsewhere classified   . Depressive disorder   . Depressive disorder, not elsewhere classified   . Emphysema   . Enthesopathy of ankle and tarsus, unspecified   . HTN (hypertension)   . Hyperlipidemia   . Hypertension   . Hypopotassemia   . Leukocytosis   . Migraine, unspecified, without mention of intractable migraine without mention of status migrainosus   . Nonspecific (abnormal) findings on radiological and other examination of skull and head   . Osteoarthrosis, unspecified whether generalized or localized, unspecified site   . Other emphysema ([place])   . Other specified disease of white blood cells   . Palpitations   . Tobacco  abuse     Her Past Surgical History Is Significant For: Past Surgical History:  Procedure Laterality Date  . CESAREAN SECTION    . COLONOSCOPY  08/14/2008   Dr.. Delfin Edis    Her Family History Is Significant For: Family History  Problem Relation Age of Onset  . Alzheimer's disease Father   . Heart disease Father   . Diabetes Father   . Skin cancer Father   . Heart disease Mother   . Breast cancer Mother     Her Social  History Is Significant For: Social History   Social History  . Marital status: Divorced    Spouse name: N/A  . Number of children: 1  . Years of education: BA   Occupational History  . retired Pharmacist, hospital   . Sub Teacher    Social History Main Topics  . Smoking status: Current Every Day Smoker    Packs/day: 1.00    Years: 28.00    Types: Cigarettes  . Smokeless tobacco: Never Used  . Alcohol use 0.0 oz/week     Comment: occ  . Drug use: No  . Sexual activity: Not Asked   Other Topics Concern  . None   Social History Narrative   Drinks 2-3 cups of coffee a day     Her Allergies Are:  Allergies  Allergen Reactions  . Cafergot   . Codeine   . Fenoprofen Calcium   . Nalfon [Fenoprofen]   :   Her Current Medications Are:  Outpatient Encounter Prescriptions as of 10/25/2015  Medication Sig  . albuterol (PROVENTIL) (5 MG/ML) 0.5% nebulizer solution Take 2.5 mg by nebulization every 6 (six) hours as needed for wheezing or shortness of breath.  . ALPRAZolam (XANAX) 0.5 MG tablet TAKE 1 TABLET BY MOUTH UP TO TWICE DAILY FOR NERVEOUSNESS OR SLEEP  . atorvastatin (LIPITOR) 40 MG tablet Take one tablet by mouth once daily for cholesterol  . budesonide-formoterol (SYMBICORT) 160-4.5 MCG/ACT inhaler Take 2 puffs first thing in am and then another 2 puffs about 12 hours later.  . Cholecalciferol (VITAMIN D3) 1000 UNITS CAPS Take 1 capsule by mouth daily.    Marland Kitchen levothyroxine (SYNTHROID, LEVOTHROID) 75 MCG tablet One daily for thyroid supplement  . losartan (COZAAR) 50 MG tablet Take one tablet by mouth once daily for blood pressure  . sertraline (ZOLOFT) 100 MG tablet Take 1 tablet (100 mg total) by mouth daily.  . [DISCONTINUED] albuterol (VENTOLIN HFA) 108 (90 BASE) MCG/ACT inhaler INHALE TWO PUFFS EVERY 4 HOURS AS NEEDED FOR COUGH OR SHORTNESS OF BREATH.  . [DISCONTINUED] atorvastatin (LIPITOR) 40 MG tablet OVERDUE for Labs, 1 by mouth daily for high cholesterol  . [DISCONTINUED]  famotidine (PEPCID) 10 MG tablet Take 10 mg by mouth at bedtime.  . [DISCONTINUED] lansoprazole (PREVACID) 15 MG capsule Take 15 mg by mouth at bedtime.  . [DISCONTINUED] magic mouthwash SOLN [employer]'s magic mouthwash:  Gargle 2-5 cc 4 times daily for oral yeast infectioin  . [DISCONTINUED] oxybutynin (DITROPAN) 5 MG tablet Take 1 tablet (5 mg total) by mouth daily.  . [DISCONTINUED] predniSONE (STERAPRED UNI-PAK 21 TAB) 5 MG (21) TBPK tablet 6 tablets day 1, 5 on day 2, 4 on day 3, 3 on day 4, 2 on day 5, and 1 on day 6.  . [DISCONTINUED] Respiratory Therapy Supplies (FLUTTER) DEVI Use as directed   No facility-administered encounter medications on file as of 10/25/2015.   :   Review of Systems:  Out of a complete 14  point review of systems, all are reviewed and negative with the exception of these symptoms as listed below:  Review of Systems  Neurological:       Patient states that she has had 2 episodes recently, as far as she knows of, where there has been a laps in time and she doesn't remember what happened during that time.     Objective:  Neurologic Exam  Physical Exam Physical Examination:   Vitals:   10/25/15 1015  BP: (!) 150/72  Pulse: 78  Resp: 16   General Examination: The patient is a very pleasant 70 y.o. female in no acute distress. She appears well-developed and well-nourished and well groomed.   HEENT: Normocephalic, atraumatic, pupils are equal, round and reactive to light and accommodation. Funduscopic exam is normal with sharp disc margins noted. Extraocular tracking is good without limitation to gaze excursion or nystagmus noted. Normal smooth pursuit is noted. Hearing is grossly intact. Tympanic membranes are clear bilaterally. Face is symmetric with normal facial animation and normal facial sensation. Speech is clear with no dysarthria noted. There is no hypophonia. There is no lip, neck/head, jaw or voice tremor. Neck is supple with full range of passive and  active motion. There are no carotid bruits on auscultation. Oropharynx exam reveals: moderate mouth dryness, marginal dental hygiene and mild airway crowding, due to redundant soft palate. Mallampati is class II. Tongue protrudes centrally and palate elevates symmetrically.  Chest: Clear to auscultation without wheezing, rhonchi or crackles noted.  Heart: S1+S2+0, regular and normal without murmurs, rubs or gallops noted.   Abdomen: Soft, non-tender and non-distended with normal bowel sounds appreciated on auscultation.  Extremities: There is no pitting edema in the distal lower extremities bilaterally. Pedal pulses are intact.  Skin: Warm and dry without trophic changes noted. Skin is dry, some bruising.   Musculoskeletal: exam reveals no obvious joint deformities, tenderness or joint swelling or erythema.   Neurologically:  Mental status: The patient is awake, alert and oriented in all 4 spheres. Her immediate and remote memory, attention, language skills and fund of knowledge are appropriate. There is no evidence of aphasia, agnosia, apraxia or anomia. Speech is clear with normal prosody and enunciation. Thought process is linear. Mood is normal and affect is normal.  Cranial nerves II - XII are as described above under HEENT exam. In addition: shoulder shrug is normal with equal shoulder height noted. Motor exam: Normal bulk, strength and tone is noted. There is no drift, tremor or rebound. Romberg testing shows mild sway, no corrective steps. Reflexes are 1+ throughout. Babinski: Toes are flexor bilaterally. Fine motor skills and coordination: intact with normal finger taps, normal hand movements, normal rapid alternating patting, normal foot taps and normal foot agility.  Cerebellar testing: No dysmetria or intention tremor on finger to nose testing. Heel to shin is unremarkable bilaterally. There is no truncal or gait ataxia.  Sensory exam: intact to light touch, pinprick, vibration,  temperature sense in the upper and lower extremities, with the exception of mild decrease of pinprick sensation in the distal upper and lower extremities.  Gait, station and balance: She stands easily. No veering to one side is noted. No leaning to one side is noted. Posture is age-appropriate and stance is narrow based. Gait shows normal stride length and normal pace. No problems turning are noted. She has difficulty with tandem walk. She states her balance is not very good.   Assessment and Plan:    In summary, DAVASIA KELLERHALS  is a very pleasant 70 y.o.-year old female with an underlying medical history of chronic lung disease, ongoing smoking, hyperglycemia, hypothyroidism, hypertension, recent diagnosis of RMSF, which was treated with antibiotics, depression, palpitations, osteoarthritis, and overweight state, who reports 2 episodes of losing time and having no recollection of this later. Differential diagnosis includes absence seizure, but clearly, there was no description of any automatisms or twitching or convulsion, no loss of consciousness, differential also includes transient global amnesia, oversedation, microsleep etc. We will investigate further with an EEG. She had blood work on 08/27/2015 which I reviewed. TSH was fine, chemistry and kidney function was fine as well. I suggested a brain MRI with and without contrast but patient states she will not be able to go through with this due to severe claustrophobia, she declined sedation with oral alprazolam. She agreed to pursue a head CT with and without contrast instead.  she has a nonfocal neurological exam. She is advised to stay better hydrated, she is advised to reduce her caffeine intake and quit smoking. She is advised to exercise more regularly, particularly in the form of walking.  We talked about maintaining good sleep hygiene as well. At this juncture, I suggested no new medication. I asked her to limit her driving to only local roads,  during the day, no highways, no long-distance driving. I will see her back routinely after these tests are done and we will also call her to update with the test results. I answered all her questions today and she was in agreement with the plan.  Thank you very much for allowing me to participate in the care of this nice patient. If I can be of any further assistance to you please do not hesitate to call me at [phone].  Sincerely,   Star Age, MD, PhD

## 2015-10-25 NOTE — Patient Instructions (Addendum)
I am not sure how to explain your recent spells of losing time.  The differential diagnosis includes TGA: You may have had an episode of TGA, transient global amnesia, during which patients can have some loss of time, typically no stroke like presentation and patients typically continue to talk and move, but have no recollection of the period, which can last for minutes or hours. We don't quite understand the etiology or significance of TGA as yet. Some reports indicate a migraine-like etiology, some others mention a seizure-like presentation or even stroke-like origin.   We will do some additional testing: I would like to do a brain scan, called MRI, but you decided against it. Instead, we will do a CT head with and without contrast. We will have to schedule you for this on a separate date. This test requires authorization from your insurance, and we will take care of the insurance process.  We will do an EEG (brainwave test), which we will schedule. We will call you with the results.  Be cautious with your driving, no long distance, no highway, no nighttime driving.   Please remember to try to maintain good sleep hygiene, which means: Keep a regular sleep and wake schedule, try not to exercise or have a meal within 2 hours of your bedtime, try to keep your bedroom conducive for sleep, that is, cool and dark, without light distractors such as an illuminated alarm clock, and refrain from watching TV right before sleep or in the middle of the night and do not keep the TV or radio on during the night. Also, try not to use or play on electronic devices at bedtime, such as your cell phone, tablet PC or laptop. If you like to read at bedtime on an electronic device, try to dim the background light as much as possible. Do not eat in the middle of the night.    Try to reduce tea intake and increase your water intake and try to exercise in the form of walking.

## 2015-11-02 ENCOUNTER — Ambulatory Visit
Admission: RE | Admit: 2015-11-02 | Discharge: 2015-11-02 | Disposition: A | Discharge status: Home / Self Care | Payer: Medicare Other | Source: Ambulatory Visit | Attending: Neurology | Admitting: Neurology

## 2015-11-02 ENCOUNTER — Telehealth: Payer: Self-pay | Admitting: Neurology

## 2015-11-02 DIAGNOSIS — R6889 Other general symptoms and signs: Secondary | ICD-10-CM

## 2015-11-02 DIAGNOSIS — G454 Transient global amnesia: Secondary | ICD-10-CM

## 2015-11-02 DIAGNOSIS — Q046 Congenital cerebral cysts: Secondary | ICD-10-CM

## 2015-11-02 MED ORDER — IOPAMIDOL (ISOVUE-300) INJECTION 61%
75.0000 mL | Freq: Once | INTRAVENOUS | Status: AC | PRN
  Administered 2015-11-02: 75 mL via INTRAVENOUS

## 2015-11-02 NOTE — Telephone Encounter (Signed)
I talked to Dr. 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  I will place a referral to [place].   Please get in touch with patient in AM, as she just had her CT this afternoon:  Head CT with and without contrast from 11/02/2015 showed a colloid cyst which is generally speaking a benign and slow growing tumor like lesion of brain, typically found in the deep center of the brain. In most instances it is asymptomatic but in some instances can cause obstruction of the flow of cerebrospinal fluid and therefore buildup of fluid pressure which we call hydrocephalus which can cause pressure on the brain and cause complications. Some patients can have headaches. To avoid complications in the future or near future, I would recommend that she is seeing a neurosurgeon. I placed a referral. Please talked to patient and facilitate referral to neurosurgery with request to be seen ASAP.

## 2015-11-02 NOTE — Progress Notes (Signed)
Please see ph note. Star Age, MD, PhD [facility])

## 2015-11-03 NOTE — Telephone Encounter (Signed)
I spoke to patient and she is aware of results and recommendations. She is willing to proceed with referral. Voiced understanding. She is advised to call back if any further questions.

## 2015-11-04 NOTE — Telephone Encounter (Signed)
I spoke to patient and answered some questions that she had about the referral process and the surgery. Patient stated that she is thankful and will call back if any further questions.

## 2015-11-04 NOTE — Telephone Encounter (Signed)
Pt called in and would like another call back regarding her results. When she received her call she was half asleep and does not remember the whole conversation and she has questions. Please call and advise [phone]

## 2015-11-18 ENCOUNTER — Ambulatory Visit (INDEPENDENT_AMBULATORY_CARE_PROVIDER_SITE_OTHER): Payer: Medicare Other | Admitting: Neurology

## 2015-11-18 DIAGNOSIS — R410 Disorientation, unspecified: Secondary | ICD-10-CM

## 2015-11-18 DIAGNOSIS — G454 Transient global amnesia: Secondary | ICD-10-CM

## 2015-11-18 DIAGNOSIS — R6889 Other general symptoms and signs: Secondary | ICD-10-CM

## 2015-11-18 NOTE — Procedures (Signed)
    History:  Gabrielle White is a 70 year old patient with a history of an episode recently of losing track of time, and some memory loss. The patient has had a recent diagnosis of [facility] spotted fever. The first episode of memory loss occurred about one year ago. The patient is being evaluated for possible seizures.  This is a routine EEG. No skull defects are noted. Medications include Proventil, Xanax, Lipitor, Symbicort, vitamin D supplementation, Synthroid, Cozaar, and Zoloft.   EEG classification: Normal awake  Description of the recording: The background rhythms of this recording consists of a fairly well modulated medium amplitude alpha rhythm of 11 Hz that is reactive to eye opening and closure. As the record progresses, the patient appears to remain in the waking state throughout the recording. Photic stimulation was performed, resulting in a bilateral and symmetric photic driving response. Hyperventilation was also performed, resulting in a minimal buildup of the background rhythm activities without significant slowing seen. At no time during the recording does there appear to be evidence of spike or spike wave discharges or evidence of focal slowing. EKG monitor shows no evidence of cardiac rhythm abnormalities with a heart rate of 66.  Impression: This is a normal EEG recording in the waking state. No evidence of ictal or interictal discharges are seen.

## 2015-11-19 NOTE — Progress Notes (Signed)
Please call and advise the patient that the EEG or brain wave test we performed was reported as normal in the awake state. We checked for abnormal electrical discharges in the brain waves and the report suggested normal findings. No further action is required on this test at this time. Please remind patient to keep any upcoming appointments or tests and to call us with any interim questions, concerns, problems or updates. Thanks,  Jourdyn Ferrin, MD, PhD  

## 2015-11-22 ENCOUNTER — Telehealth: Payer: Self-pay

## 2015-11-22 NOTE — Telephone Encounter (Signed)
-----   Message from Star Age, MD sent at 11/19/2015 11:47 AM EDT ----- Please call and advise the patient that the EEG or brain wave test we performed was reported as normal in the awake state. We checked for abnormal electrical discharges in the brain waves and the report suggested normal findings. No further action is required on this test at this time. Please remind patient to keep any upcoming appointments or tests and to call us with any interim questions, concerns, problems or updates. Thanks,  Star Age, MD, PhD

## 2015-11-22 NOTE — Telephone Encounter (Signed)
LM per DPR with results below. Left call back number for further questions.

## 2015-11-23 ENCOUNTER — Other Ambulatory Visit: Payer: Medicare Other

## 2015-12-22 ENCOUNTER — Telehealth: Payer: Self-pay | Admitting: *Deleted

## 2015-12-22 NOTE — Telephone Encounter (Signed)
Patient called and left message and stated that Dr. Nyoka Cowden had given her a Rx for Wellbutrin and she has not started taking it yet. Wants to start it to stop smoking. She is wanting to know if she should stop any of her other medications while taking this medication.   I reviewed patient's chart and I don't see where Dr. Nyoka Cowden gave her a Rx and it is not in her Medication list. At the last OV on 10/06/15 Dr. Nyoka Cowden stated that he Advised patient to stop smoking but patient is not receptive to the advise. I tried calling patient back and LMOM to return call.

## 2015-12-23 ENCOUNTER — Other Ambulatory Visit: Payer: Self-pay | Admitting: Internal Medicine

## 2015-12-23 DIAGNOSIS — E039 Hypothyroidism, unspecified: Secondary | ICD-10-CM

## 2015-12-28 NOTE — Telephone Encounter (Signed)
He gave her the Rx last year and she filled it but she never took it. Now she wants to take it to try and stop smoking and wants to know if she can take her current medications with it. She has the bottle of medication: Bupropion XL 150mg  one tablet daily. Prescribed on 08/2014. Please Advise.

## 2015-12-28 NOTE — Telephone Encounter (Signed)
I recommend we wait on Dr Nyoka Cowden for this request

## 2015-12-29 NOTE — Telephone Encounter (Signed)
LM advising patient it was recommended that  Dr. Nyoka Cowden addresses message and we would called when he advises. Forwarded to Dr. Nyoka Cowden.

## 2016-01-02 ENCOUNTER — Other Ambulatory Visit: Payer: Self-pay | Admitting: Internal Medicine

## 2016-01-04 NOTE — Telephone Encounter (Signed)
Start Bupropion and continue all other medications.

## 2016-01-05 NOTE — Telephone Encounter (Signed)
Patient notified

## 2016-01-10 ENCOUNTER — Telehealth: Payer: Self-pay | Admitting: *Deleted

## 2016-01-10 NOTE — Telephone Encounter (Signed)
See if this can be covered as a generic albuterol.

## 2016-01-10 NOTE — Telephone Encounter (Signed)
Patient called and stated that her Proventil is not covered by her insurance and would like it changed to something different. Please Advise.

## 2016-01-11 MED ORDER — ALBUTEROL SULFATE HFA 108 (90 BASE) MCG/ACT IN AERS: 1.0000 | INHALATION_SPRAY | Freq: Four times a day (QID) | RESPIRATORY_TRACT | 3 refills | Status: DC | PRN

## 2016-01-19 ENCOUNTER — Encounter: Payer: Self-pay | Admitting: Internal Medicine

## 2016-01-19 ENCOUNTER — Ambulatory Visit (INDEPENDENT_AMBULATORY_CARE_PROVIDER_SITE_OTHER): Payer: Medicare Other | Admitting: Internal Medicine

## 2016-01-19 VITALS — BP 138/72 | HR 92 | Temp 98.0°F | Ht 65.0 in | Wt 160.8 lb

## 2016-01-19 DIAGNOSIS — B370 Candidal stomatitis: Secondary | ICD-10-CM | POA: Diagnosis not present

## 2016-01-19 DIAGNOSIS — J449 Chronic obstructive pulmonary disease, unspecified: Secondary | ICD-10-CM

## 2016-01-19 DIAGNOSIS — I10 Essential (primary) hypertension: Secondary | ICD-10-CM | POA: Diagnosis not present

## 2016-01-19 DIAGNOSIS — N3941 Urge incontinence: Secondary | ICD-10-CM

## 2016-01-19 DIAGNOSIS — F17210 Nicotine dependence, cigarettes, uncomplicated: Secondary | ICD-10-CM | POA: Diagnosis not present

## 2016-01-19 MED ORDER — NYSTATIN 100000 UNIT/ML MT SUSP: OROMUCOSAL | 0 refills | Status: DC

## 2016-01-19 MED ORDER — BUDESONIDE-FORMOTEROL FUMARATE 160-4.5 MCG/ACT IN AERO: INHALATION_SPRAY | RESPIRATORY_TRACT | 11 refills | Status: DC

## 2016-01-19 MED ORDER — OXYBUTYNIN CHLORIDE 5 MG PO TABS: ORAL_TABLET | ORAL | 2 refills | Status: DC

## 2016-01-19 NOTE — Progress Notes (Signed)
Facility  [place]    Place of Service:   OFFICE    Allergies  Allergen Reactions  . Cafergot   . Codeine   . Fenoprofen Calcium   . Nalfon [Fenoprofen]     Chief Complaint  Patient presents with  . Acute Visit    Sore throat, cold, "feels terrible"    HPI:  Sore throat since 10/14/127. Using prednisone dose pack due to dyspnea. Coughing up thick clear phlegm.  Asthmatic bronchitis , chronic (HCC) - wheezing  Thrush - roof of the mouth  Cigarette smoker - continues to smoke  Essential hypertension - controlled   Medications: Patient's Medications  New Prescriptions   No medications on file  Previous Medications   ALBUTEROL (PROAIR HFA) 108 (90 BASE) MCG/ACT INHALER    Inhale 1-2 puffs into the lungs every 6 (six) hours as needed for wheezing or shortness of breath.   ALPRAZOLAM (XANAX) 0.5 MG TABLET    TAKE 1 TABLET BY MOUTH UP TO TWICE DAILY FOR NERVOUSNESS OR SLEEP   ATORVASTATIN (LIPITOR) 40 MG TABLET    Take one tablet by mouth once daily for cholesterol   BUDESONIDE-FORMOTEROL (SYMBICORT) 160-4.5 MCG/ACT INHALER    Take 2 puffs first thing in am and then another 2 puffs about 12 hours later.   CHOLECALCIFEROL (VITAMIN D3) 1000 UNITS CAPS    Take 1 capsule by mouth daily.     LEVOTHYROXINE (SYNTHROID, LEVOTHROID) 75 MCG TABLET    TAKE 1 TABLET BY MOUTH ONCE DAILY FOR THYROID SUPPLEMENT   LOSARTAN (COZAAR) 50 MG TABLET    Take one tablet by mouth once daily for blood pressure   SERTRALINE (ZOLOFT) 100 MG TABLET    Take 1 tablet (100 mg total) by mouth daily.  Modified Medications   No medications on file  Discontinued Medications   ALBUTEROL (PROVENTIL) (5 MG/ML) 0.5% NEBULIZER SOLUTION    Take 2.5 mg by nebulization every 6 (six) hours as needed for wheezing or shortness of breath.   PREDNISONE (STERAPRED UNI-PAK 48 TAB) 10 MG (48) TBPK TABLET    Take 1 tablet by mouth daily.    Review of Systems  Constitutional: Positive for fatigue. Negative for activity  change, appetite change, chills, diaphoresis, fever and unexpected weight change.  HENT: Positive for sore throat. Negative for congestion, ear discharge, ear pain, hearing loss, postnasal drip, rhinorrhea, tinnitus, trouble swallowing and voice change.   Eyes: Negative.  Negative for pain, redness, itching and visual disturbance.  Respiratory: Positive for cough, shortness of breath and wheezing. Negative for choking.        Tobacco abuse  Cardiovascular: Negative for chest pain, palpitations and leg swelling.  Gastrointestinal: Negative for abdominal distention, abdominal pain, constipation, diarrhea, nausea and vomiting.  Endocrine: Negative for cold intolerance, heat intolerance, polydipsia, polyphagia and polyuria.       History hypothyroidism  Genitourinary: Negative for difficulty urinating, dysuria, flank pain, frequency, hematuria, pelvic pain, urgency and vaginal discharge.       Episodes of urinary incontinence. Improved on Myrbetriq.  Musculoskeletal: Negative for arthralgias, back pain, gait problem, myalgias, neck pain and neck stiffness.  Skin: Negative for color change, pallor and rash.  Allergic/Immunologic: Negative.   Neurological: Positive for dizziness. Negative for tremors, seizures, syncope, speech difficulty, weakness, light-headedness, numbness and headaches.       Absence spells  Hematological: Negative for adenopathy. Does not bruise/bleed easily.  Psychiatric/Behavioral: Positive for decreased concentration. Negative for agitation, behavioral problems, confusion, dysphoric mood, hallucinations, sleep  disturbance and suicidal ideas. The patient is not nervous/anxious and is not hyperactive.        Feeling depressed sometimes.    Vitals:   01/19/16 1539  BP: 138/72  Pulse: 92  Temp: 98 F (36.7 C)  TempSrc: Oral  SpO2: 93%  Weight: 160 lb 12.8 oz (72.9 kg)  Height: '5\' 5"'  (1.651 m)   Body mass index is 26.76 kg/m. Wt Readings from Last 3 Encounters:    01/19/16 160 lb 12.8 oz (72.9 kg)  10/25/15 164 lb (74.4 kg)  10/06/15 166 lb (75.3 kg)      Physical Exam  Constitutional: She is oriented to person, place, and time. She appears well-developed and well-nourished. No distress.  HENT:  Head: Normocephalic and atraumatic.  Right Ear: External ear normal.  Left Ear: External ear normal.  Nose: Nose normal.  yeast plaques on the uvula and soft palate. Erythema of tonsillar crypts bilaterally. No adenopathy.  Eyes: Conjunctivae and EOM are normal. Pupils are equal, round, and reactive to light. Left eye exhibits no discharge.  Corrective lenses.  Neck: Normal range of motion. Neck supple. No JVD present. No tracheal deviation present. No thyromegaly present.  Cardiovascular: Normal rate, regular rhythm, normal heart sounds and intact distal pulses.  Exam reveals no gallop and no friction rub.   No murmur heard. Pulmonary/Chest: She is in respiratory distress. She has wheezes. She has rales. She exhibits tenderness (left lower chest and to a lesser degree right lower chest).  Bilateral bronchial rattle. Partial clearing with cough. Wheezing. Has dyspnea at rest and when talking.  Abdominal: Soft. Bowel sounds are normal. She exhibits no distension and no mass. There is no tenderness.  Genitourinary:  Genitourinary Comments: Goes to [facility].  Musculoskeletal: Normal range of motion. She exhibits no edema or tenderness.  Tender at the tip of the coccyx.  Lymphadenopathy:    She has no cervical adenopathy.  Neurological: She is alert and oriented to person, place, and time. No cranial nerve deficit. Coordination normal.  Skin: Skin is warm and dry. No rash noted. She is not diaphoretic. No erythema. No pallor.   lipoma of the right greater trochanter with some scarring around it secondary to previous removals. Small scar under right ribs from prior boil.  Psychiatric: She has a normal mood and affect. Her behavior is normal.  Thought content normal.    Labs reviewed: Lab Summary Latest Ref Rng & Units 08/27/2015  Hemoglobin - (None)  Hematocrit - (None)  White count - (None)  Platelet count - (None)  Sodium 134 - 144 mmol/L 141  Potassium 3.5 - 5.2 mmol/L 4.4  Calcium 8.7 - 10.3 mg/dL 9.5  Phosphorus - (None)  Creatinine 0.57 - 1.00 mg/dL 0.88  AST 0 - 40 IU/L 14  Alk Phos 39 - 117 IU/L 119(H)  Bilirubin 0.0 - 1.2 mg/dL 0.4  Glucose 65 - 99 mg/dL 106(H)  Cholesterol - (None)  HDL cholesterol - (None)  Triglycerides - (None)  LDL Direct - (None)  LDL Calc - (None)  Total protein - (None)  Albumin 3.5 - 4.8 g/dL 4.4  Some recent data might be hidden   Lab Results  Component Value Date   TSH 3.930 08/27/2015   TSH 7.740 (H) 10/26/2014   TSH 5.210 (H) 12/10/2013   Lab Results  Component Value Date   BUN 12 08/27/2015   BUN 10 10/26/2014   BUN 11 12/10/2013   Lab Results  Component Value Date  HGBA1C 5.9 (H) 06/17/2013    Assessment/Plan  1. Asthmatic bronchitis , chronic (HCC) - budesonide-formoterol (SYMBICORT) 160-4.5 MCG/ACT inhaler; Take 2 puffs first thing in am and then another 2 puffs about 12 hours later.  Dispense: 1 Inhaler; Refill: 11  2. Thrush - nystatin (MYCOSTATIN) 100000 UNIT/ML suspension; Swish and and swallow 4 times daily to treat oral yeast  Dispense: 120 mL; Refill: 0  3. Cigarette smoker Encouraged to stop  4. Essential hypertension controlled

## 2016-01-19 NOTE — Patient Instructions (Signed)
Mucinex 1200 mg twice daily to thin mucus.

## 2016-01-20 ENCOUNTER — Telehealth: Payer: Self-pay

## 2016-01-20 NOTE — Telephone Encounter (Signed)
Message left on triage voicemail, please call to clarify how many mL's patient is to swish and swallow

## 2016-01-21 NOTE — Telephone Encounter (Signed)
[facility] called back, use 5 ml .

## 2016-01-21 NOTE — Telephone Encounter (Signed)
5 cc for each dose is correct.

## 2016-01-24 NOTE — Telephone Encounter (Signed)
I called [facility] to confirm that rx instructions was clarified. Patient has already picked up rx and it was clarified

## 2016-01-25 ENCOUNTER — Ambulatory Visit: Payer: Medicare Other | Admitting: Internal Medicine

## 2016-01-28 ENCOUNTER — Other Ambulatory Visit: Payer: Self-pay | Admitting: Neurosurgery

## 2016-01-28 DIAGNOSIS — Q046 Congenital cerebral cysts: Secondary | ICD-10-CM

## 2016-01-31 ENCOUNTER — Telehealth: Payer: Self-pay

## 2016-01-31 ENCOUNTER — Ambulatory Visit: Payer: Medicare Other | Admitting: Neurology

## 2016-01-31 NOTE — Telephone Encounter (Signed)
Patient did not show to appt today  

## 2016-02-03 ENCOUNTER — Ambulatory Visit
Admission: RE | Admit: 2016-02-03 | Discharge: 2016-02-03 | Disposition: A | Discharge status: Home / Self Care | Payer: Medicare Other | Source: Ambulatory Visit | Attending: Neurosurgery | Admitting: Neurosurgery

## 2016-02-03 ENCOUNTER — Encounter: Payer: Self-pay | Admitting: Neurology

## 2016-02-03 DIAGNOSIS — Q046 Congenital cerebral cysts: Secondary | ICD-10-CM

## 2016-02-07 ENCOUNTER — Other Ambulatory Visit: Payer: Self-pay | Admitting: Internal Medicine

## 2016-02-07 ENCOUNTER — Encounter: Payer: Self-pay | Admitting: Neurology

## 2016-02-07 DIAGNOSIS — Z1231 Encounter for screening mammogram for malignant neoplasm of breast: Secondary | ICD-10-CM

## 2016-02-13 ENCOUNTER — Other Ambulatory Visit: Payer: Self-pay | Admitting: Internal Medicine

## 2016-02-13 DIAGNOSIS — I10 Essential (primary) hypertension: Secondary | ICD-10-CM

## 2016-02-14 ENCOUNTER — Other Ambulatory Visit: Payer: Self-pay | Admitting: Internal Medicine

## 2016-02-14 DIAGNOSIS — J449 Chronic obstructive pulmonary disease, unspecified: Secondary | ICD-10-CM

## 2016-03-06 ENCOUNTER — Telehealth: Payer: Self-pay | Admitting: Internal Medicine

## 2016-03-06 ENCOUNTER — Encounter: Payer: Self-pay | Admitting: Nurse Practitioner

## 2016-03-06 ENCOUNTER — Ambulatory Visit (INDEPENDENT_AMBULATORY_CARE_PROVIDER_SITE_OTHER): Payer: Medicare Other | Admitting: Nurse Practitioner

## 2016-03-06 VITALS — BP 125/86 | HR 78 | Temp 98.1°F | Resp 17 | Ht 65.0 in | Wt 163.4 lb

## 2016-03-06 DIAGNOSIS — Z23 Encounter for immunization: Secondary | ICD-10-CM | POA: Diagnosis not present

## 2016-03-06 DIAGNOSIS — M940 Chondrocostal junction syndrome [Tietze]: Secondary | ICD-10-CM

## 2016-03-06 NOTE — Patient Instructions (Signed)
ALEVE 1 tablet twice daily for 1 week then as needed Do not take other NSAIDS while on aleve ( Advil, Motrin, Ibuprofen)    Costochondritis Costochondritis is swelling and irritation (inflammation) of the tissue (cartilage) that connects your ribs to your breastbone (sternum). This causes pain in the front of your chest. The pain usually starts gradually and involves more than one rib. What are the causes? The exact cause of this condition is not always known. It results from stress on the cartilage where your ribs attach to your sternum. The cause of this stress could be:  Chest injury (trauma).  Exercise or activity, such as lifting.  Severe coughing. What increases the risk? You may be at higher risk for this condition if you:  Are female.  Are 18?71 years old.  Recently started a new exercise or work activity.  Have low levels of vitamin D.  Have a condition that makes you cough frequently. What are the signs or symptoms? The main symptom of this condition is chest pain. The pain:  Usually starts gradually and can be sharp or dull.  Gets worse with deep breathing, coughing, or exercise.  Gets better with rest.  May be worse when you press on the sternum-rib connection (tenderness). How is this diagnosed? This condition is diagnosed based on your symptoms, medical history, and a physical exam. Your health care provider will check for tenderness when pressing on your sternum. This is the most important finding. You may also have tests to rule out other causes of chest pain. These may include:  A chest X-ray to check for lung problems.  An electrocardiogram (ECG) to see if you have a heart problem that could be causing the pain.  An imaging scan to rule out a chest or rib fracture. How is this treated? This condition usually goes away on its own over time. Your health care provider may prescribe an NSAID to reduce pain and inflammation. Your health care provider may  also suggest that you:  Rest and avoid activities that make pain worse.  Apply heat or cold to the area to reduce pain and inflammation.  Do exercises to stretch your chest muscles. If these treatments do not help, your health care provider may inject a numbing medicine at the sternum-rib connection to help relieve the pain. Follow these instructions at home:  Avoid activities that make pain worse. This includes any activities that use chest, abdominal, and side muscles.  If directed, put ice on the painful area:  Put ice in a plastic bag.  Place a towel between your skin and the bag.  Leave the ice on for 20 minutes, 2-3 times a day.  If directed, apply heat to the affected area as often as told by your health care provider. Use the heat source that your health care provider recommends, such as a moist heat pack or a heating pad.  Place a towel between your skin and the heat source.  Leave the heat on for 20-30 minutes.  Remove the heat if your skin turns bright red. This is especially important if you are unable to feel pain, heat, or cold. You may have a greater risk of getting burned.  Take over-the-counter and prescription medicines only as told by your health care provider.  Return to your normal activities as told by your health care provider. Ask your health care provider what activities are safe for you.  Keep all follow-up visits as told by your health care provider. This  is important. Contact a health care provider if:  You have chills or a fever.  Your pain does not go away or it gets worse.  You have a cough that does not go away (is persistent). Get help right away if:  You have shortness of breath. This information is not intended to replace advice given to you by your health care provider. Make sure you discuss any questions you have with your health care provider. Document Released: 12/28/2004 Document Revised: 10/08/2015 Document Reviewed:  07/14/2015 Elsevier Interactive Patient Education  2017 [employer].

## 2016-03-06 NOTE — Progress Notes (Signed)
Careteam: Patient Care Team: Estill Dooms, MD as PCP - General (Internal Medicine)  Advanced Directive information Does Patient Have a Medical Advance Directive?: Yes, Type of Advance Directive: [place];Living will  Allergies  Allergen Reactions  . Cafergot   . Codeine   . Fenoprofen Calcium   . Nalfon [Fenoprofen]     Chief Complaint  Patient presents with  . Acute Visit    Pt fell on Thanksgiving while walking on wooden pallet. Pain under right breast that spreads to back. Pain is worse under breast with deep breaths  . Other    Pt wants flu vaccine today.      HPI: Patient is a 70 y.o. female seen in the office today due to fall. On thanksgiving day, she was getting hay for her horses across 2 pallets and the boards broke and she hit her ribs on her right side.  Still having a lot of pain. Does not feel like it is broken because she has had broken ribs before but very painful.  Taking advil a couple of times a day which helps some.  Pain is 5-6/10 at the worse, some positions make it worse. No worsening shortness of breath, cough, or congestion.  No fevers.   Review of Systems:  Review of Systems  Constitutional: Negative for activity change, appetite change, chills, diaphoresis, fatigue, fever and unexpected weight change.  Eyes: Negative.  Negative for pain, redness, itching and visual disturbance.  Respiratory: Positive for cough, shortness of breath and wheezing. Negative for choking.        Tobacco abuse  Cardiovascular: Negative for chest pain, palpitations and leg swelling.  Endocrine:       History hypothyroidism  Musculoskeletal: Positive for myalgias.       Pain to right side of rib cage after fall  Skin: Negative for color change, rash and wound.  Allergic/Immunologic: Negative.   Neurological: Negative for dizziness, tremors, seizures, syncope, speech difficulty, weakness, light-headedness, numbness and headaches.       Absence  spells  Hematological: Negative for adenopathy. Does not bruise/bleed easily.  Psychiatric/Behavioral:       Feeling depressed sometimes.    Past Medical History:  Diagnosis Date  . Chronic airway obstruction, not elsewhere classified   . Depressive disorder   . Depressive disorder, not elsewhere classified   . Emphysema   . Enthesopathy of ankle and tarsus, unspecified   . HTN (hypertension)   . Hyperlipidemia   . Hypertension   . Hypopotassemia   . Leukocytosis   . Migraine, unspecified, without mention of intractable migraine without mention of status migrainosus   . Nonspecific (abnormal) findings on radiological and other examination of skull and head   . Osteoarthrosis, unspecified whether generalized or localized, unspecified site   . Other emphysema ([place])   . Other specified disease of white blood cells   . Palpitations   . Tobacco abuse    Past Surgical History:  Procedure Laterality Date  . CESAREAN SECTION    . COLONOSCOPY  08/14/2008   Dr.. Delfin Edis   Social History:   reports that she has been smoking Cigarettes.  She has a 28.00 pack-year smoking history. She has never used smokeless tobacco. She reports that she does not drink alcohol or use drugs.  Family History  Problem Relation Age of Onset  . Alzheimer's disease Father   . Heart disease Father   . Diabetes Father   . Skin cancer Father   .  Heart disease Mother   . Breast cancer Mother     Medications: Patient's Medications  New Prescriptions   No medications on file  Previous Medications   ALBUTEROL (PROAIR HFA) 108 (90 BASE) MCG/ACT INHALER    Inhale 1-2 puffs into the lungs every 6 (six) hours as needed for wheezing or shortness of breath.   ALPRAZOLAM (XANAX) 0.5 MG TABLET    TAKE 1 TABLET BY MOUTH UP TO TWICE DAILY FOR NERVOUSNESS OR SLEEP   ATORVASTATIN (LIPITOR) 40 MG TABLET    Take one tablet by mouth once daily for cholesterol   CHOLECALCIFEROL (VITAMIN D3) 1000 UNITS CAPS    Take 1  capsule by mouth daily.     LEVOTHYROXINE (SYNTHROID, LEVOTHROID) 75 MCG TABLET    TAKE 1 TABLET BY MOUTH ONCE DAILY FOR THYROID SUPPLEMENT   LOSARTAN (COZAAR) 50 MG TABLET    TAKE 1 TABLET BY MOUTH ONCE DAILY FOR BLOOD PRESSURE   SERTRALINE (ZOLOFT) 100 MG TABLET    Take 1 tablet (100 mg total) by mouth daily.   SYMBICORT 160-4.5 MCG/ACT INHALER    INHALE 2 PUFFS BY MOUTH FIRST THING IN THE MORNING AND 2 PUFFS ABOUT 12 HOURS LATER  Modified Medications   No medications on file  Discontinued Medications   NYSTATIN (MYCOSTATIN) 100000 UNIT/ML SUSPENSION    Swish and and swallow 4 times daily to treat oral yeast   OXYBUTYNIN (DITROPAN) 5 MG TABLET    Take one tablet 3 times daily to help bladder control     Physical Exam:  Vitals:   03/06/16 1336  BP: 125/86  Pulse: 78  Resp: 17  Temp: 98.1 F (36.7 C)  TempSrc: Oral  SpO2: 95%  Weight: 163 lb 6.4 oz (74.1 kg)  Height: 5\' 5"  (1.651 m)   Body mass index is 27.19 kg/m.  Physical Exam  Constitutional: She is oriented to person, place, and time. She appears well-developed and well-nourished. No distress.  Eyes:  Corrective lenses.  Cardiovascular: Normal rate, regular rhythm, normal heart sounds and intact distal pulses.  Exam reveals no gallop and no friction rub.   No murmur heard. Pulmonary/Chest: She is in respiratory distress. She has wheezes. She has rales. She exhibits tenderness.  Bilateral bronchial rattle. Partial clearing with cough. Wheezing. Right sided rib cage tenderness, 4-5th intercostal space  Neurological: She is alert and oriented to person, place, and time. No cranial nerve deficit.  Skin: Skin is warm and dry. She is not diaphoretic.    Labs reviewed: Basic Metabolic Panel:  Recent Labs  08/27/15 0821  NA 141  K 4.4  CL 98  CO2 25  GLUCOSE 106*  BUN 12  CREATININE 0.88  CALCIUM 9.5  TSH 3.930   Liver Function Tests:  Recent Labs  08/27/15 0821  AST 14  ALT 11  ALKPHOS 119*  BILITOT  0.4  PROT 6.4  ALBUMIN 4.4   No results for input(s): LIPASE, AMYLASE in the last 8760 hours. No results for input(s): AMMONIA in the last 8760 hours. CBC: No results for input(s): WBC, NEUTROABS, HGB, HCT, MCV, PLT in the last 8760 hours. Lipid Panel: No results for input(s): CHOL, HDL, LDLCALC, TRIG, CHOLHDL, LDLDIRECT in the last 8760 hours. TSH:  Recent Labs  08/27/15 0821  TSH 3.930   A1C: Lab Results  Component Value Date   HGBA1C 5.9 (H) 06/17/2013     Assessment/Plan 1. Costochondritis Due to contusion after fall.  To use aleve 1 tablet twice daily for 1  week, stop advil while on aleve -may use ice or heat as needed -may use muscle rub  2. Need for immunization against influenza - Flu Vaccine QUAD 36+ mos PF IM (Fluarix & Fluzone Quad PF)  Return precautions discussed     Carlos American. Harle Battiest  [facility] [phone] 8 am - 5 pm) [phone] (after hours)

## 2016-03-09 ENCOUNTER — Ambulatory Visit: Payer: Medicare Other

## 2016-03-20 ENCOUNTER — Ambulatory Visit (INDEPENDENT_AMBULATORY_CARE_PROVIDER_SITE_OTHER): Payer: Medicare Other | Admitting: Internal Medicine

## 2016-03-20 ENCOUNTER — Ambulatory Visit (INDEPENDENT_AMBULATORY_CARE_PROVIDER_SITE_OTHER)
Admission: RE | Admit: 2016-03-20 | Discharge: 2016-03-20 | Disposition: A | Discharge status: Home / Self Care | Payer: Medicare Other | Source: Ambulatory Visit | Attending: Internal Medicine | Admitting: Internal Medicine

## 2016-03-20 ENCOUNTER — Encounter: Payer: Self-pay | Admitting: Internal Medicine

## 2016-03-20 VITALS — BP 122/70 | HR 99 | Ht 65.0 in | Wt 160.0 lb

## 2016-03-20 DIAGNOSIS — J449 Chronic obstructive pulmonary disease, unspecified: Secondary | ICD-10-CM

## 2016-03-20 DIAGNOSIS — F17210 Nicotine dependence, cigarettes, uncomplicated: Secondary | ICD-10-CM

## 2016-03-20 DIAGNOSIS — J4489 Other specified chronic obstructive pulmonary disease: Secondary | ICD-10-CM

## 2016-03-20 MED ORDER — PREDNISONE 10 MG PO TABS: ORAL_TABLET | ORAL | 0 refills | Status: DC

## 2016-03-20 NOTE — Progress Notes (Addendum)
Subjective:   Patient ID: Gabrielle White, female    DOB: 11-09-1945 .   MRN: [id number]    Brief patient profile:  4 yowf active smoker with AB/GOLD 0 copd by pfts 03/08/15   History of Present Illness  01/22/2015 acute extended re-establish ov/Wert re:  AB/ still smoking  Chief Complaint  Patient presents with  . Pulmonary Consult    pt last seen in 2014. pt states DR. Green wanted her to follow up. pt states somethines her throat feels like it closes up and she cant breath. pt states she feels like she has to take really deep breaths. pt c/o chest tightness, and prod cough white in color.pt c/o thrush she thiks may come from the inhailers.     Last better breathing  x 4 weeks prior to OV  p using prednisone but benefit only for a few days p stopped despite maint rx with symbicort (though hfa poor)  Nl routine is symbicort 160 2 bid and maybe ventolin and occ brovana no recent duoneb need  In retrospect really hasn't been able to really stay well x 4 y Cough is harsh and congested worse day than noct  rec Start Prednisone 10mg  Take 4 for two days three for two days two for two days one for two days  Plan A =  Automatic = symbicort 160 Take 2 puffs first thing in am and then another 2 puffs about 12 hours later.  Work on Interior and spatial designer: Plan B = Backup  Only use your albuterol  Plan C = crisis  Only use nebulizer iprotropium-albuterol (duoneb) if you try the ventolin first and it fails to help > ok to use up to every 4 hours  Plan D = doctor call us if not improving  Plan E = ER > go there if all else fails Try prilosec otc 20mg   Take 30-60 min before first meal of the day and Pepcid ac (famotidine) 20 mg one @  bedtime    GERD (REFLUX) diet      03/08/2015  f/u ov/Wert re: COPD/ AB  GOLD 0/ still smoking  Chief Complaint  Patient presents with  . Follow-up    PFT done today. Pt states that her breathing has improved greatly. She is able to do more activities. Pt  c/o occasional cough that quickly resolves and occasional dyspnea with exertion. Pt denies wheeze/CP/tightness.   rec The key is to stop smoking completely before smoking completely stops you!  Ok to stop the pm dose of symbicort 160 and add back if any problems   06/07/2015  f/u ov/Wert re: GOLD0  Still smoking  / maint rx symbicort 160 2bid and gerd rx  for which she questions need    Chief Complaint  Patient presents with  . Follow-up    Pt c/s continued SOB, cough/wheeze. Pt also c/o some mild stomach/abdominal pain that is not always associated with her breathing problems. Pt states that she has been unable to stop smoking.   on saba qod / mucus is white and thick  Has not tried to increase saba or use mucinex   rec mucinex or mucinex dm up to 1200 mg every 12 hours and use the flutter as much as you can Ok to try off the acid suppression to see what difference if any this makes  GERD diet   03/20/2016  f/u ov/Wert re:  GOLD 0 copd/ still smoking on symb 160 2bid/ prn proair  Risk analyst  Complaint  Patient presents with  . Acute Visit    Pt c/o increased SOB off and on for the past several wks. She states some days she has to use rescue inhaler 3 x per day, and some days none at all.  She also c/o increased cough, prod with clear sputum. She also c/o right side pain underneath her breast radiating to back since having a fall approx 3 wks ago.    has not used rescue today / not using gerd diet/ any form of acid suppresion  Mucus is thck/ white worse cough in am's  Onset of flare insidious/ gradually worse sob x 2 weeks, better saba/hfa only/ no neb    No obvious pattern day to day or daytime variabilty or assoc  purulent sputum or mucus plugs or hemoptysis  or   subjective wheeze overt sinus or hb symptoms. No unusual exp hx or h/o childhood pna/ asthma or knowledge of premature birth.   Also denies any obvious fluctuation of symptoms with weather or environmental changes or other  aggravating or alleviating factors except as outlined above   Current Medications, Allergies, Complete Past Medical History, Past Surgical History, Family History, and Social History were reviewed in [facility] record.  ROS  The following are not active complaints unless bolded sore throat, dysphagia, dental problems, itching, sneezing,  nasal congestion or excess/ purulent secretions, ear ache,   fever, chills, sweats, unintended wt loss, pleuritic or exertional cp, hemoptysis,  orthopnea pnd or leg swelling, presyncope, palpitations, heartburn, abdominal pain, anorexia, nausea, vomiting, diarrhea  or change in bowel or urinary habits, change in stools or urine, dysuria,hematuria,  rash, arthralgias, visual complaints, headache, numbness weakness or ataxia or problems with walking or coordination,  change in mood/affect or memory.           Objective:   Physical Exam GEN: A/Ox3; pleasant , NAD, ambulatory well nourished  Pos smoker's rattle    01/22/2015      156 > 03/08/2015   162 > 06/07/2015  170 > 03/20/2016 160     01/16/13 156 lb (70.761 kg)  01/08/13 155 lb 3.2 oz (70.398 kg)  12/20/12 157 lb 9.6 oz (71.487 kg)       HEENT:  [place]/AT,  EACs-clear, TMs-wnl, NOSE-clear, THROAT-clear, no lesions, no postnasal drip or exudate noted.   NECK:  Supple w/ fair ROM; no JVD; normal carotid impulses w/o bruits; no thyromegaly or nodules palpated; no lymphadenopathy.    RESP   Junky exp  > insp bilateral rhonchi R > L   CARD:  RRR, no m/r/g  , no peripheral edema, pulses intact, no cyanosis or clubbing.  GI:   Soft & nt; nml bowel sounds; no organomegaly or masses detected.   Musco: Warm bil, no deformities or joint swelling noted.   Neuro: alert, no focal deficits noted.    Skin: Warm, no lesions or rashes      CXR PA and Lateral:   03/20/2016 :    I personally reviewed images and agree with radiology impression as follows:   Congestive heart failure with mild  bilateral pulmonary interstitial edema .Congestive heart failure with mild bilateral pulmonary interstitial edema . My impression:  Copd / no def chf > echo ordered      Assessment & Plan:

## 2016-03-20 NOTE — Assessment & Plan Note (Signed)
>   3 min Discussed the risks and costs (both direct and indirect)  of smoking relative to the benefits of quitting but patient unwilling to commit at this point to a specific quit date.    Although I don't endorse regular use of e cigs/ many pts find them helpful; however, I emphasized they should be considered a "one-way bridge" off all tobacco products.  

## 2016-03-20 NOTE — Patient Instructions (Addendum)
Please remember to go to the  x-ray department downstairs for your tests - we will call you with the results when they are available.  Prednisone 10 mg take  4 each am x 2 days,   2 each am x 2 days,  1 each am x 2 days and stop   Try prilosec otc 20mg   Take 30-60 min before first meal of the day and Pepcid ac (famotidine) 20 mg one @  bedtime until cough is completely gone for at least a week without the need for cough suppression     For cough > mucinex dm up to 1200 mg every 12 hours and use the flutter valve as much as possible   Please schedule a follow up visit in 6  months but call sooner if needed with pfts on return here  Late add:  Needs echo

## 2016-03-20 NOTE — Assessment & Plan Note (Signed)
-   PFTs  11/18/10  FEV1  2.0 (95%) ratio 73 and no change p saba and DLCO 75 corrects to 100%    - 03/08/2015  extensive coaching HFA effectiveness =    90% from a baseline of 75 %  -PFT's  03/08/2015  FEV1 1.85 (78 % ) ratio 73  p 5 % improvement from saba with DLCO  63 % corrects to 72 % for alv volume and erv 17   - added flutter 06/07/2015       DDX of  difficult airways management almost all start with A and  include Adherence, Ace Inhibitors, Acid Reflux, Active Sinus Disease, Alpha 1 Antitripsin deficiency, Anxiety masquerading as Airways dz,  ABPA,  Allergy(esp in young), Aspiration (esp in elderly), Adverse effects of meds,  Active smokers, A bunch of PE's (a small clot burden can't cause this syndrome unless there is already severe underlying pulm or vascular dz with poor reserve) plus two Bs  = Bronchiectasis and Beta blocker use..and one C= CHF   Adherence is always the initial "prime suspect" and is a multilayered concern that requires a "trust but verify" approach in every patient - starting with knowing how to use medications, especially inhalers, correctly, keeping up with refills and understanding the fundamental difference between maintenance and prns vs those medications only taken for a very short course and then stopped and not refilled.  - - The proper method of use, as well as anticipated side effects, of a metered-dose inhaler are discussed and demonstrated to the patient. Improved effectiveness after extensive coaching during this visit to a level of approximately 90 % from a baseline of 75 % > continue HFA  Active smoking (see separate a/p)   ? Acid (or non-acid) GERD > always difficult to exclude as up to 75% of pts in some series report no assoc GI/ Heartburn symptoms> rec max (24h)  acid suppression  Reviewed - ok to wean off when cough is gone  ? Allergy/ not likely to help to w/u in active smoking  ? ABPA > check IgE if not improving    ? Active sinus dz > sinus ct next  if not better  ? Alpha one AT > unlikely with pfts so unimpressive in active smoker   ? Bronchiectasis / may need hrct to rule out   I had an extended discussion with the patient reviewing all relevant studies completed to date and  lasting 15 to 20 minutes of a 25 minute visit    Each maintenance medication was reviewed in detail including most importantly the difference between maintenance and prns and under what circumstances the prns are to be triggered using an action plan format that is not reflected in the computer generated alphabetically organized AVS.    Please see AVS for unique instructions that I personally wrote and verbalized to the the pt in detail and then reviewed with pt  by my nurse highlighting any  changes in therapy recommended at today's visit to their plan of care.

## 2016-03-21 ENCOUNTER — Other Ambulatory Visit: Payer: Self-pay | Admitting: Internal Medicine

## 2016-03-21 DIAGNOSIS — E039 Hypothyroidism, unspecified: Secondary | ICD-10-CM

## 2016-03-21 DIAGNOSIS — R0600 Dyspnea, unspecified: Secondary | ICD-10-CM | POA: Insufficient documentation

## 2016-03-21 DIAGNOSIS — R0609 Other forms of dyspnea: Principal | ICD-10-CM | POA: Insufficient documentation

## 2016-03-21 NOTE — Progress Notes (Signed)
Spoke with pt and notified of results per Dr. Wert. Pt verbalized understanding and denied any questions. 

## 2016-04-11 ENCOUNTER — Other Ambulatory Visit: Payer: Self-pay

## 2016-04-11 ENCOUNTER — Ambulatory Visit (HOSPITAL_COMMUNITY): Payer: Medicare Other | Attending: Internal Medicine

## 2016-04-11 DIAGNOSIS — R0609 Other forms of dyspnea: Secondary | ICD-10-CM | POA: Diagnosis not present

## 2016-04-11 DIAGNOSIS — Z720 Tobacco use: Secondary | ICD-10-CM | POA: Diagnosis not present

## 2016-04-11 DIAGNOSIS — I119 Hypertensive heart disease without heart failure: Secondary | ICD-10-CM | POA: Diagnosis not present

## 2016-04-11 DIAGNOSIS — R0600 Dyspnea, unspecified: Secondary | ICD-10-CM | POA: Diagnosis present

## 2016-04-11 DIAGNOSIS — E785 Hyperlipidemia, unspecified: Secondary | ICD-10-CM | POA: Diagnosis not present

## 2016-04-12 ENCOUNTER — Ambulatory Visit
Admission: RE | Admit: 2016-04-12 | Discharge: 2016-04-12 | Disposition: A | Discharge status: Home / Self Care | Payer: Medicare Other | Source: Ambulatory Visit | Attending: Internal Medicine | Admitting: Internal Medicine

## 2016-04-12 DIAGNOSIS — Z1231 Encounter for screening mammogram for malignant neoplasm of breast: Secondary | ICD-10-CM

## 2016-04-13 ENCOUNTER — Other Ambulatory Visit: Payer: Self-pay

## 2016-04-13 DIAGNOSIS — E7849 Other hyperlipidemia: Secondary | ICD-10-CM

## 2016-04-13 DIAGNOSIS — R739 Hyperglycemia, unspecified: Secondary | ICD-10-CM

## 2016-04-13 DIAGNOSIS — E038 Other specified hypothyroidism: Secondary | ICD-10-CM

## 2016-04-13 DIAGNOSIS — I10 Essential (primary) hypertension: Secondary | ICD-10-CM

## 2016-04-13 NOTE — Progress Notes (Signed)
Pt was notified of results

## 2016-04-14 ENCOUNTER — Ambulatory Visit: Payer: Medicare Other | Admitting: Internal Medicine

## 2016-04-17 ENCOUNTER — Other Ambulatory Visit: Payer: Medicare Other

## 2016-04-17 DIAGNOSIS — R739 Hyperglycemia, unspecified: Secondary | ICD-10-CM

## 2016-04-17 DIAGNOSIS — I10 Essential (primary) hypertension: Secondary | ICD-10-CM

## 2016-04-17 DIAGNOSIS — E038 Other specified hypothyroidism: Secondary | ICD-10-CM

## 2016-04-17 DIAGNOSIS — E7849 Other hyperlipidemia: Secondary | ICD-10-CM

## 2016-04-17 LAB — CBC WITH DIFFERENTIAL/PLATELET
BASOS ABS: 83 {cells}/uL (ref 0–200)
Basophils Relative: 1 %
Eosinophils Absolute: 415 cells/uL (ref 15–500)
Eosinophils Relative: 5 %
HEMATOCRIT: 46.1 % — AB (ref 35.0–45.0)
HEMOGLOBIN: 15.7 g/dL — AB (ref 11.7–15.5)
LYMPHS ABS: 2490 {cells}/uL (ref 850–3900)
Lymphocytes Relative: 30 %
MCH: 32 pg (ref 27.0–33.0)
MCHC: 34.1 g/dL (ref 32.0–36.0)
MCV: 94.1 fL (ref 80.0–100.0)
MONO ABS: 581 {cells}/uL (ref 200–950)
MPV: 10.7 fL (ref 7.5–12.5)
Monocytes Relative: 7 %
NEUTROS PCT: 57 %
Neutro Abs: 4731 cells/uL (ref 1500–7800)
Platelets: 287 10*3/uL (ref 140–400)
RBC: 4.9 MIL/uL (ref 3.80–5.10)
RDW: 13.5 % (ref 11.0–15.0)
WBC: 8.3 10*3/uL (ref 3.8–10.8)

## 2016-04-17 LAB — COMPLETE METABOLIC PANEL WITH GFR
ALK PHOS: 102 U/L (ref 33–130)
ALT: 13 U/L (ref 6–29)
AST: 14 U/L (ref 10–35)
Albumin: 4.1 g/dL (ref 3.6–5.1)
BILIRUBIN TOTAL: 0.7 mg/dL (ref 0.2–1.2)
BUN: 10 mg/dL (ref 7–25)
CALCIUM: 9.6 mg/dL (ref 8.6–10.4)
CO2: 29 mmol/L (ref 20–31)
CREATININE: 0.86 mg/dL (ref 0.60–0.93)
Chloride: 102 mmol/L (ref 98–110)
GFR, Est African American: 79 mL/min (ref >=60)
GFR, Est Non African American: 68 mL/min (ref >=60)
GLUCOSE: 100 mg/dL — AB (ref 65–99)
POTASSIUM: 4.3 mmol/L (ref 3.5–5.3)
SODIUM: 141 mmol/L (ref 135–146)
Total Protein: 6.7 g/dL (ref 6.1–8.1)

## 2016-04-17 LAB — LIPID PANEL
Cholesterol: 170 mg/dL (ref <200)
HDL: 60 mg/dL (ref >50)
LDL CALC: 84 mg/dL (ref <100)
Total CHOL/HDL Ratio: 2.8 Ratio (ref <5.0)
Triglycerides: 132 mg/dL (ref <150)
VLDL: 26 mg/dL (ref <30)

## 2016-04-17 LAB — TSH: TSH: 8.13 mIU/L — ABNORMAL HIGH

## 2016-04-19 ENCOUNTER — Ambulatory Visit: Payer: Medicare Other | Admitting: Internal Medicine

## 2016-04-20 ENCOUNTER — Ambulatory Visit: Payer: Medicare Other | Admitting: Internal Medicine

## 2016-04-26 ENCOUNTER — Other Ambulatory Visit: Payer: Self-pay | Admitting: Internal Medicine

## 2016-05-15 ENCOUNTER — Encounter: Payer: Self-pay | Admitting: Internal Medicine

## 2016-05-15 ENCOUNTER — Ambulatory Visit (INDEPENDENT_AMBULATORY_CARE_PROVIDER_SITE_OTHER): Payer: Medicare Other | Admitting: Internal Medicine

## 2016-05-15 ENCOUNTER — Ambulatory Visit (INDEPENDENT_AMBULATORY_CARE_PROVIDER_SITE_OTHER)
Admission: RE | Admit: 2016-05-15 | Discharge: 2016-05-15 | Disposition: A | Discharge status: Home / Self Care | Payer: Medicare Other | Source: Ambulatory Visit | Attending: Internal Medicine | Admitting: Internal Medicine

## 2016-05-15 VITALS — BP 124/90 | HR 85 | Ht 65.0 in | Wt 161.8 lb

## 2016-05-15 DIAGNOSIS — F17210 Nicotine dependence, cigarettes, uncomplicated: Secondary | ICD-10-CM

## 2016-05-15 DIAGNOSIS — J449 Chronic obstructive pulmonary disease, unspecified: Secondary | ICD-10-CM | POA: Diagnosis not present

## 2016-05-15 DIAGNOSIS — J4489 Other specified chronic obstructive pulmonary disease: Secondary | ICD-10-CM

## 2016-05-15 MED ORDER — PREDNISONE 10 MG PO TABS: ORAL_TABLET | ORAL | 0 refills | Status: DC

## 2016-05-15 NOTE — Progress Notes (Signed)
Subjective:   Patient ID: Gabrielle White, female    DOB: [birth year] .   MRN: [id number]    Brief patient profile:  29 yowf active smoker with AB/GOLD 0 copd by pfts 03/08/15   History of Present Illness  01/22/2015 acute extended re-establish ov/Jakarius Flamenco re:  AB/ still smoking  Chief Complaint  Patient presents with  . Pulmonary Consult    pt last seen in 2014. pt states DR. Green wanted her to follow up. pt states somethines her throat feels like it closes up and she cant breath. pt states she feels like she has to take really deep breaths. pt c/o chest tightness, and prod cough white in color.pt c/o thrush she thiks may come from the inhailers.     Last better breathing  x 4 weeks prior to OV  p using prednisone but benefit only for a few days p stopped despite maint rx with symbicort (though hfa poor)  Nl routine is symbicort 160 2 bid and maybe ventolin and occ brovana no recent duoneb need  In retrospect really hasn't been able to really stay well x 4 y Cough is harsh and congested worse day than noct  rec Start Prednisone 10mg  Take 4 for two days three for two days two for two days one for two days  Plan A =  Automatic = symbicort 160 Take 2 puffs first thing in am and then another 2 puffs about 12 hours later.  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 Follow-up    PFT done today. Pt states that her breathing has improved greatly. She is able to do more activities. Pt  c/o occasional cough that quickly resolves and occasional dyspnea with exertion. Pt denies wheeze/CP/tightness.   rec The key is to stop smoking completely before smoking completely stops you!  Ok to stop the pm dose of symbicort 160 and add back if any problems   06/07/2015  f/u ov/Teryl Gubler re: GOLD 0  Still smoking  / maint rx symbicort 160 2bid and gerd rx  for which she questions need    Chief Complaint  Patient presents with  . Follow-up    Pt c/s continued SOB, cough/wheeze. Pt also c/o some mild stomach/abdominal pain that is not always associated with her breathing problems. Pt states that she has been unable to stop smoking.   on saba qod / mucus is white and thick  Has not tried to increase saba or use mucinex   rec mucinex or mucinex dm up to 1200 mg every 12 hours and use the flutter as much as you can Ok to try off the acid suppression to see what difference if any this makes  GERD diet   03/20/2016  f/u ov/Yidel Teuscher re:  GOLD 0 copd/ still smoking on symb 160 2bid/ prn proair  Chief Complaint  Patient presents with  . Acute Visit    Pt c/o increased SOB off and on for the past several wks. She states some days she has to use rescue inhaler 3 x per day, and some days none at all.  She also c/o increased cough, prod with clear sputum. She also c/o right side pain underneath her breast radiating to back since having a fall approx 3 wks ago.    has not used rescue today / not using gerd diet/ any form of acid suppresion  Mucus is thck/ white worse cough in am's  Onset of flare insidious/ gradually worse sob x 2 weeks, better saba/hfa only/ no neb rec Please remember to go to the  x-ray department downstairs for your tests - we will call you with the results when they are available. Prednisone 10 mg take  4 each am x 2 days,   2 each am x 2 days,  1 each am x 2 days and stop  Try prilosec otc 20mg   Take 30-60 min before first meal of the day and Pepcid ac (famotidine) 20 mg one @  bedtime  until cough is completely gone for at least a week without the need for cough suppression For cough > mucinex dm up to 1200 mg every 12 hours and use the flutter valve as much as possible  Please schedule a follow up visit in 6  months but call sooner if needed with pfts on return here  Late add:  Needs echo      05/15/2016  f/u ov/Landree Fernholz re: COPD 0 AB/ still smoking  Chief Complaint  Patient presents with  . Follow-up    Breathing has improved- relates to air purifier. She states that she is also coughing much less. She has been using proair about 2 x daily on average.    took 6 day course of prednisone and helped a lot  But not good about taking symb and breathing gradually worse since stopped pred and regular use of symbiocort and alos noted no saba p pred and gradually needing more  No obvious day to day or daytime variability or assoc excess/ purulent sputum or mucus plugs or hemoptysis or cp or chest tightness, subjective wheeze or overt sinus or hb symptoms. No unusual exp hx or h/o childhood pna/ asthma or knowledge of premature birth.  Sleeping ok without nocturnal  or early am exacerbation  of respiratory  c/o's or need for noct saba. Also denies any obvious fluctuation of symptoms with weather or environmental changes or other aggravating or alleviating factors except as outlined above   Current Medications, Allergies, Complete Past Medical History, Past Surgical History, Family History, and Social History were reviewed in [employer] [facility] record.  ROS  The following are not active complaints unless bolded sore throat, dysphagia, dental problems, itching, sneezing,  nasal congestion or excess/ purulent secretions, ear ache,   fever, chills, sweats, unintended wt loss, classically pleuritic or exertional cp,  orthopnea pnd or leg swelling, presyncope, palpitations, abdominal pain, anorexia, nausea, vomiting, diarrhea  or change in bowel or bladder habits, change in  stools or urine, dysuria,hematuria,  rash, arthralgias, visual complaints, headache, numbness, weakness or ataxia or problems with walking or coordination,  change in mood/affect or memory.                     Objective:   Physical Exam GEN: A/Ox3; pleasant , NAD, ambulatory well nourished  Vital signs reviewed - Note on arrival 02 sats  95% on  RA      01/22/2015      156 > 03/08/2015   162 > 06/07/2015  170 > 03/20/2016 160 > 05/15/2016   162     01/16/13 156 lb (70.761 kg)  01/08/13 155 lb 3.2 oz (70.398 kg)  12/20/12 157 lb 9.6 oz (71.487 kg)       HEENT:  [place]/AT,  EACs-clear, TMs-wnl, NOSE-clear, THROAT-clear, no lesions, no postnasal drip or exudate noted.   NECK:  Supple w/ fair ROM; no JVD; normal carotid impulses w/o bruits; no thyromegaly or nodules palpated; no lymphadenopathy.    RESP   Very coarse insp and exp rhonchi bilaterally  CARD:  RRR, no m/r/g  , no peripheral edema, pulses intact, no cyanosis or clubbing.  GI:   Soft & nt; nml bowel sounds; no organomegaly or masses detected.   Musco: Warm bil, no deformities or joint swelling noted.   Neuro: alert, no focal deficits noted.    Skin: Warm, no lesions or rashes     CXR PA and Lateral:   05/15/2016 :    I personally reviewed images and agree with radiology impression as follows:         Assessment & Plan:

## 2016-05-15 NOTE — Patient Instructions (Addendum)
Please remember to go to the   x-ray department downstairs in the basement  for your tests - we will call you with the results when they are available.    The key is to stop smoking completely before smoking completely stops you!   Please schedule a follow up visit in 3 months but call sooner if needed with pfts on return

## 2016-05-15 NOTE — Assessment & Plan Note (Addendum)
-   PFTs  11/18/10  FEV1  2.0 (95%) ratio 73 and no change p saba and DLCO 75 corrects to 100% -PFT's  03/08/2015  FEV1 1.85 (78 % ) ratio 73  p 5 % improvement from saba with DLCO  63 % corrects to 72 % for alv volume and erv 17   - added flutter 06/07/2015  - 05/15/2016  After extensive coaching HFA effectiveness =    75%   DDX of  difficult airways management almost all start with A and  include Adherence, Ace Inhibitors, Acid Reflux, Active Sinus Disease, Alpha 1 Antitripsin deficiency, Anxiety masquerading as Airways dz,  ABPA,  Allergy(esp in young), Aspiration (esp in elderly), Adverse effects of meds,  Active smokers, A bunch of PE's (a small clot burden can't cause this syndrome unless there is already severe underlying pulm or vascular dz with poor reserve) plus two Bs  = Bronchiectasis and Beta blocker use..and one C= CHF   In this case Adherence is the biggest issue and starts with  inability to use HFA effectively and also  understand that SABA treats the symptoms but doesn't get to the underlying problem (inflammation).  I used  the analogy of putting steroid cream on a rash to help explain the meaning of topical therapy and the need to get the drug to the target tissue.    Active smoking - see sep a/p   ? Allergy/ suggested by response to prednisone.  Reminded here that symbicort is low dose topical prednisone and note allergy w/u usually not helpful in active smoker but I did give her another pred x 6 day taper if she flares between visits to address any allergy/inflammatory component acutely prn   ? Anxiety > usually at the bottom of this list of usual suspects but should be much higher on this pt's based on H and P and note already on psychotropics ? Related to inability to stop smoking/ follow instructions > Follow up per Primary Care planned   I had an extended discussion with the patient reviewing all relevant studies completed to date and  lasting 15 to 20 minutes of a 25 minute visit     Each maintenance medication was reviewed in detail including most importantly the difference between maintenance and prns and under what circumstances the prns are to be triggered using an action plan format that is not reflected in the computer generated alphabetically organized AVS.    Please see AVS for specific instructions unique to this visit that I personally wrote and verbalized to the the pt in detail and then reviewed with pt  by my nurse highlighting any  changes in therapy recommended at today's visit to their plan of care.

## 2016-05-15 NOTE — Assessment & Plan Note (Signed)
>   3 min discussion  Discussed the risks and costs (both direct and indirect)  of smoking relative to the benefits of quitting but patient unwilling to commit at this point to a specific quit date and use of chantix may be problematic as already on 2 psychotropics > Follow up per Primary Care planned

## 2016-05-16 NOTE — Progress Notes (Signed)
Pt returned call. Informed her of the results per MW. Pt verbalized understanding and denied any further questions or concerns at this time.

## 2016-05-23 ENCOUNTER — Ambulatory Visit (INDEPENDENT_AMBULATORY_CARE_PROVIDER_SITE_OTHER): Payer: Medicare Other | Admitting: Internal Medicine

## 2016-05-23 ENCOUNTER — Encounter: Payer: Self-pay | Admitting: Internal Medicine

## 2016-05-23 VITALS — BP 144/70 | HR 69 | Temp 98.3°F | Resp 16 | Ht 65.0 in | Wt 163.4 lb

## 2016-05-23 DIAGNOSIS — R739 Hyperglycemia, unspecified: Secondary | ICD-10-CM

## 2016-05-23 DIAGNOSIS — N3941 Urge incontinence: Secondary | ICD-10-CM | POA: Diagnosis not present

## 2016-05-23 DIAGNOSIS — M67439 Ganglion, unspecified wrist: Secondary | ICD-10-CM | POA: Insufficient documentation

## 2016-05-23 DIAGNOSIS — E038 Other specified hypothyroidism: Secondary | ICD-10-CM | POA: Diagnosis not present

## 2016-05-23 DIAGNOSIS — R053 Chronic cough: Secondary | ICD-10-CM

## 2016-05-23 DIAGNOSIS — J449 Chronic obstructive pulmonary disease, unspecified: Secondary | ICD-10-CM

## 2016-05-23 DIAGNOSIS — M67431 Ganglion, right wrist: Secondary | ICD-10-CM | POA: Diagnosis not present

## 2016-05-23 DIAGNOSIS — I10 Essential (primary) hypertension: Secondary | ICD-10-CM

## 2016-05-23 DIAGNOSIS — E784 Other hyperlipidemia: Secondary | ICD-10-CM

## 2016-05-23 DIAGNOSIS — F17210 Nicotine dependence, cigarettes, uncomplicated: Secondary | ICD-10-CM

## 2016-05-23 DIAGNOSIS — J4489 Other specified chronic obstructive pulmonary disease: Secondary | ICD-10-CM

## 2016-05-23 DIAGNOSIS — E7849 Other hyperlipidemia: Secondary | ICD-10-CM

## 2016-05-23 DIAGNOSIS — R05 Cough: Secondary | ICD-10-CM | POA: Diagnosis not present

## 2016-05-23 MED ORDER — MIRABEGRON ER 25 MG PO TB24: ORAL_TABLET | ORAL | 0 refills | Status: DC

## 2016-05-23 MED ORDER — ALPRAZOLAM 0.5 MG PO TABS: ORAL_TABLET | ORAL | 0 refills | Status: DC

## 2016-05-23 NOTE — Progress Notes (Signed)
Facility  [facility]    Place of Service:   OFFICE    Allergies  Allergen Reactions  . Cafergot   . Codeine   . Fenoprofen Calcium   . Nalfon [Fenoprofen]     Chief Complaint  Patient presents with  . Medical Management of Chronic Issues    3 month follow-up  . Medication Refill    refill Xanax     HPI:    Chronic cough - related to bronchitis. She thinks it is  Little better since she started using an air filter in the house. Has given away her horses. Still with a dog.   Asthmatic bronchitis , chronic ([place]) - she recognizes the impact of her smoking as well as environmental allergens.  Essential hypertension - controlled  Other specified hypothyroidism - rising TSH. She misses sime of her doses of levethyroxine.  Hyperglycemia - controlled  Other hyperlipidemia - controlled  Ganglion of right wrist - Ganglion right wrist radial side. Painful at times. Noted several weeks ago. No known trauma.  Cigarette smoker - still at about 1/2 PPD  Urge incontinence of urine - Sometimes she has days of frequent urination. Incontinent. Nocturia. Wearing a protective pad. No dysuria. Was tried on Oxybutynin but it made her fidgety and feel bad.    Medications: Patient's Medications  New Prescriptions   No medications on file  Previous Medications   ATORVASTATIN (LIPITOR) 40 MG TABLET    Take one tablet by mouth once daily for cholesterol   CHOLECALCIFEROL (VITAMIN D3) 1000 UNITS CAPS    Take 1 capsule by mouth daily.     LEVOTHYROXINE (SYNTHROID, LEVOTHROID) 75 MCG TABLET    TAKE 1 TABLET BY MOUTH ONCE DAILY FOR THYROID SUPPLEMENT   LOSARTAN (COZAAR) 50 MG TABLET    TAKE 1 TABLET BY MOUTH ONCE DAILY FOR BLOOD PRESSURE   PREDNISONE (DELTASONE) 10 MG TABLET    Take  4 each am x 2 days,   2 each am x 2 days,  1 each am x 2 days and stop   PROAIR HFA 108 (90 BASE) MCG/ACT INHALER    INHALE 1 TO 2 PUFFS INTO THE LUNGS EVERY 6 HOURS AS NEEDED FOR WHEEZING OR SHORTNESS OF BREATH   SERTRALINE (ZOLOFT) 100 MG TABLET    Take 1 tablet (100 mg total) by mouth daily.   SYMBICORT 160-4.5 MCG/ACT INHALER    INHALE 2 PUFFS BY MOUTH FIRST THING IN THE MORNING AND 2 PUFFS ABOUT 12 HOURS LATER  Modified Medications   Modified Medication Previous Medication   ALPRAZOLAM (XANAX) 0.5 MG TABLET ALPRAZolam (XANAX) 0.5 MG tablet      TAKE 1 TABLET BY MOUTH UP TO TWICE DAILY FOR NERVOUSNESS OR SLEEP    TAKE 1 TABLET BY MOUTH UP TO TWICE DAILY FOR NERVOUSNESS OR SLEEP  Discontinued Medications   No medications on file    Review of Systems  Constitutional: Positive for fatigue. Negative for activity change, appetite change, chills, diaphoresis, fever and unexpected weight change.  HENT: Positive for sore throat. Negative for congestion, ear discharge, ear pain, hearing loss, postnasal drip, rhinorrhea, tinnitus, trouble swallowing and voice change.   Eyes: Negative.  Negative for pain, redness, itching and visual disturbance.  Respiratory: Positive for cough, shortness of breath and wheezing. Negative for choking.        Tobacco abuse  Cardiovascular: Negative for chest pain, palpitations and leg swelling.  Gastrointestinal: Negative for abdominal distention, abdominal pain, constipation, diarrhea, nausea and vomiting.  Endocrine: Negative for cold intolerance, heat intolerance, polydipsia, polyphagia and polyuria.       History hypothyroidism  Genitourinary: Negative for difficulty urinating, dysuria, flank pain, frequency, hematuria, pelvic pain, urgency and vaginal discharge.       Episodes of urinary incontinence. Improved on Myrbetriq.  Musculoskeletal: Negative for arthralgias, back pain, gait problem, myalgias, neck pain and neck stiffness.  Skin: Negative for color change, pallor and rash.  Allergic/Immunologic: Negative.   Neurological: Positive for dizziness. Negative for tremors, seizures, syncope, speech difficulty, weakness, light-headedness, numbness and headaches.        Absence spells  Hematological: Negative for adenopathy. Does not bruise/bleed easily.  Psychiatric/Behavioral: Positive for decreased concentration. Negative for agitation, behavioral problems, confusion, dysphoric mood, hallucinations, sleep disturbance and suicidal ideas. The patient is not nervous/anxious and is not hyperactive.        Feeling depressed sometimes.    Vitals:   05/23/16 1548  BP: (!) 144/70  Pulse: 69  Resp: 16  Temp: 98.3 F (36.8 C)  TempSrc: Oral  SpO2: 94%  Weight: 163 lb 6.4 oz (74.1 kg)  Height: 5' 5" (1.651 m)   Body mass index is 27.19 kg/m. Wt Readings from Last 3 Encounters:  05/23/16 163 lb 6.4 oz (74.1 kg)  05/15/16 161 lb 12.8 oz (73.4 kg)  03/20/16 160 lb (72.6 kg)      Physical Exam  Constitutional: She is oriented to person, place, and time. She appears well-developed and well-nourished. No distress.  HENT:  Head: Normocephalic and atraumatic.  Right Ear: External ear normal.  Left Ear: External ear normal.  Nose: Nose normal.  No adenopathy.  Eyes: Conjunctivae and EOM are normal. Pupils are equal, round, and reactive to light. Left eye exhibits no discharge.  Corrective lenses.  Neck: Normal range of motion. Neck supple. No JVD present. No tracheal deviation present. No thyromegaly present.  Cardiovascular: Normal rate, regular rhythm, normal heart sounds and intact distal pulses.  Exam reveals no gallop and no friction rub.   No murmur heard. Pulmonary/Chest: She has wheezes. She has rales.  Bilateral bronchial rattle. Partial clearing with cough. Wheezing. Has dyspnea at rest and when talking.  Abdominal: Soft. Bowel sounds are normal. She exhibits no distension and no mass. There is no tenderness.  Genitourinary:  Genitourinary Comments: Goes to [facility].  Musculoskeletal: Normal range of motion. She exhibits no edema or tenderness.  Ganglion of th radial side of the right wrist.  Lymphadenopathy:    She has no  cervical adenopathy.  Neurological: She is alert and oriented to person, place, and time. No cranial nerve deficit. Coordination normal.  Skin: Skin is warm and dry. No rash noted. She is not diaphoretic. No erythema. No pallor.   lipoma of the right greater trochanter with some scarring around it secondary to previous removals. Small scar under right ribs from prior boil.  Psychiatric: She has a normal mood and affect. Her behavior is normal. Thought content normal.    Labs reviewed: Lab Summary Latest Ref Rng & Units 04/17/2016  Hemoglobin 11.7 - 15.5 g/dL 15.7(H)  Hematocrit 35.0 - 45.0 % 46.1(H)  White count 3.8 - 10.8 K/uL 8.3  Platelet count 140 - 400 K/uL 287  Sodium 135 - 146 mmol/L 141  Potassium 3.5 - 5.3 mmol/L 4.3  Calcium 8.6 - 10.4 mg/dL 9.6  Phosphorus - (None)  Creatinine 0.60 - 0.93 mg/dL 0.86  AST 10 - 35 U/L 14  Alk Phos 33 - 130 U/L 102  Bilirubin 0.2 - 1.2 mg/dL 0.7  Glucose 65 - 99 mg/dL 100(H)  Cholesterol <200 mg/dL 170  HDL cholesterol >50 mg/dL 60  Triglycerides <150 mg/dL 132  LDL Direct - (None)  LDL Calc <100 mg/dL 84  Total protein 6.1 - 8.1 g/dL 6.7  Albumin 3.6 - 5.1 g/dL 4.1  Some recent data might be hidden   Lab Results  Component Value Date   TSH 8.13 (H) 04/17/2016   TSH 3.930 08/27/2015   TSH 7.740 (H) 10/26/2014   Lab Results  Component Value Date   BUN 10 04/17/2016   BUN 12 08/27/2015   BUN 10 10/26/2014   Lab Results  Component Value Date   HGBA1C 5.9 (H) 06/17/2013    Assessment/Plan  1. Chronic cough Must stop smoking if she expects this to get better.  2. Asthmatic bronchitis , chronic (HCC) Saw Dr. Melvyn Novas recently No change in medications.  3. Essential hypertension - Basic metabolic panel; Future  4. Other specified hypothyroidism The current medical regimen is effective;  continue present plan and medications. - TSH; Future  5. Hyperglycemia - Basic metabolic panel; Future  6. Other  hyperlipidemia Controlled  7. Ganglion of right wrist -try Aspercream with lidocaine. If pains persist, refer to ortho.  8. Cigarette smoker Encouraged to stop  9. Urge incontinence of urine - mirabegron ER (MYRBETRIQ) 25 MG TB24 tablet; One daily to help bladder control  Dispense: 21 tablet; Refill: 0. Samples.

## 2016-05-23 NOTE — Patient Instructions (Signed)
Aspercream with lidocaine to wrist 4 times daily may help the pain

## 2016-05-30 ENCOUNTER — Ambulatory Visit: Payer: Medicare Other

## 2016-05-30 ENCOUNTER — Other Ambulatory Visit: Payer: Self-pay | Admitting: Internal Medicine

## 2016-06-05 ENCOUNTER — Other Ambulatory Visit: Payer: Self-pay | Admitting: Internal Medicine

## 2016-06-12 ENCOUNTER — Other Ambulatory Visit: Payer: Self-pay | Admitting: Internal Medicine

## 2016-06-13 ENCOUNTER — Other Ambulatory Visit: Payer: Self-pay | Admitting: Internal Medicine

## 2016-06-14 ENCOUNTER — Telehealth: Payer: Self-pay | Admitting: Internal Medicine

## 2016-06-14 DIAGNOSIS — R053 Chronic cough: Secondary | ICD-10-CM

## 2016-06-14 DIAGNOSIS — R05 Cough: Secondary | ICD-10-CM

## 2016-06-14 NOTE — Telephone Encounter (Signed)
lmtcb x1 for pt. 

## 2016-06-15 NOTE — Telephone Encounter (Signed)
Spoke with pt. States that she thinks she would benefit from seeing an Allergist. Pt would like a referral.  MW - please advise. Thanks.

## 2016-06-15 NOTE — Telephone Encounter (Signed)
Called and spoke to pt. Informed her of the referral per MW. Order placed. Pt verbalized understanding and denied any further questions or concerns at this time.

## 2016-06-15 NOTE — Telephone Encounter (Signed)
Fine with me > refer to [facility] [facility] but she needs to understand limits of what they can offer if she's still smoking

## 2016-06-19 ENCOUNTER — Other Ambulatory Visit: Payer: Self-pay | Admitting: Internal Medicine

## 2016-06-19 DIAGNOSIS — E039 Hypothyroidism, unspecified: Secondary | ICD-10-CM

## 2016-06-19 DIAGNOSIS — J449 Chronic obstructive pulmonary disease, unspecified: Secondary | ICD-10-CM

## 2016-06-20 ENCOUNTER — Other Ambulatory Visit: Payer: Self-pay | Admitting: *Deleted

## 2016-06-20 DIAGNOSIS — J449 Chronic obstructive pulmonary disease, unspecified: Secondary | ICD-10-CM

## 2016-06-20 MED ORDER — BUDESONIDE-FORMOTEROL FUMARATE 160-4.5 MCG/ACT IN AERO: INHALATION_SPRAY | RESPIRATORY_TRACT | 2 refills | Status: DC

## 2016-06-20 NOTE — Telephone Encounter (Signed)
Patient called and requested refill to be faxed to pharmacy. 

## 2016-06-24 ENCOUNTER — Other Ambulatory Visit: Payer: Self-pay | Admitting: Internal Medicine

## 2016-06-24 DIAGNOSIS — I10 Essential (primary) hypertension: Secondary | ICD-10-CM

## 2016-07-04 ENCOUNTER — Ambulatory Visit (INDEPENDENT_AMBULATORY_CARE_PROVIDER_SITE_OTHER): Payer: Medicare Other | Admitting: Nurse Practitioner

## 2016-07-04 ENCOUNTER — Encounter: Payer: Self-pay | Admitting: Nurse Practitioner

## 2016-07-04 VITALS — BP 152/80 | HR 81 | Temp 97.9°F | Resp 16 | Ht 65.0 in | Wt 160.0 lb

## 2016-07-04 DIAGNOSIS — W57XXXA Bitten or stung by nonvenomous insect and other nonvenomous arthropods, initial encounter: Secondary | ICD-10-CM | POA: Diagnosis not present

## 2016-07-04 DIAGNOSIS — L03319 Cellulitis of trunk, unspecified: Secondary | ICD-10-CM

## 2016-07-04 MED ORDER — DOXYCYCLINE HYCLATE 100 MG PO TABS: 100.0000 mg | ORAL_TABLET | Freq: Two times a day (BID) | ORAL | 0 refills | Status: DC

## 2016-07-04 NOTE — Progress Notes (Signed)
Careteam: Patient Care Team: Estill Dooms, MD as PCP - General (Internal Medicine)  Advanced Directive information Does Patient Have a Medical Advance Directive?: Yes, Type of Advance Directive: [place];Living will  Allergies  Allergen Reactions  . Cafergot   . Codeine   . Fenoprofen Calcium   . Nalfon [Fenoprofen]     Chief Complaint  Patient presents with  . Acute Visit    Tick bite on left side and under her left arm.   . Medication Refill    prednisone      HPI: Patient is a 71 y.o. female seen in the office today due to tick bite 2 days ago under left arm. Area very tender, itching and red. Mostly itches.   Also noted lump under her left arm pit  Found red circular area under her bra strap on left side as well. Thinks tick bite her there too.  No fever or chills.  Had RMSF last year this time   Review of Systems:  Review of Systems  Constitutional: Negative for activity change, appetite change, chills, diaphoresis, fatigue, fever and unexpected weight change.  Eyes: Negative.  Negative for pain, redness, itching and visual disturbance.  Respiratory: Positive for cough, shortness of breath and wheezing. Negative for choking.        Tobacco use, no plans to quit, chronic wheezing  Cardiovascular: Negative for chest pain, palpitations and leg swelling.  Endocrine:       History hypothyroidism  Musculoskeletal: Positive for myalgias.  Skin: Positive for color change (redness around tick bite) and wound.  Allergic/Immunologic: Negative.   Neurological: Negative for dizziness, tremors, seizures, syncope, speech difficulty, weakness, light-headedness, numbness and headaches.       Absence spells  Hematological: Negative for adenopathy. Does not bruise/bleed easily.    Past Medical History:  Diagnosis Date  . Chronic airway obstruction, not elsewhere classified   . Depressive disorder   . Depressive disorder, not elsewhere classified   .  Emphysema   . Enthesopathy of ankle and tarsus, unspecified   . HTN (hypertension)   . Hyperlipidemia   . Hypertension   . Hypopotassemia   . Leukocytosis   . Migraine, unspecified, without mention of intractable migraine without mention of status migrainosus   . Nonspecific (abnormal) findings on radiological and other examination of skull and head   . Osteoarthrosis, unspecified whether generalized or localized, unspecified site   . Other emphysema ([place])   . Other specified disease of white blood cells   . Palpitations   . Tobacco abuse    Past Surgical History:  Procedure Laterality Date  . CESAREAN SECTION    . COLONOSCOPY  08/14/2008   Dr.. Delfin Edis   Social History:   reports that she has been smoking Cigarettes.  She has a 28.00 pack-year smoking history. She has never used smokeless tobacco. She reports that she does not drink alcohol or use drugs.  Family History  Problem Relation Age of Onset  . Alzheimer's disease Father   . Heart disease Father   . Diabetes Father   . Skin cancer Father   . Heart disease Mother   . Breast cancer Mother     Medications: Patient's Medications  New Prescriptions   No medications on file  Previous Medications   ALPRAZOLAM (XANAX) 0.5 MG TABLET    TAKE 1 TABLET BY MOUTH UP TO TWICE DAILY FOR NERVOUSNESS OR SLEEP   ATORVASTATIN (LIPITOR) 40 MG TABLET  Take one tablet by mouth once daily for cholesterol   ATORVASTATIN (LIPITOR) 40 MG TABLET    TAKE 1 TABLET BY MOUTH DAILY FOR HIGH CHOLESTEROL   BUDESONIDE-FORMOTEROL (SYMBICORT) 160-4.5 MCG/ACT INHALER    Inhale 2 puffs by mouth first thing in the morning and 2 puffs about 12 hours later   CHOLECALCIFEROL (VITAMIN D3) 1000 UNITS CAPS    Take 1 capsule by mouth daily.     LEVOTHYROXINE (SYNTHROID, LEVOTHROID) 75 MCG TABLET    TAKE 1 TABLET BY MOUTH DAILY FOR THYROID SUPPLEMENT   LOSARTAN (COZAAR) 50 MG TABLET    TAKE 1 TABLET BY MOUTH ONCE DAILY FOR BLOOD PRESSURE   MIRABEGRON ER  (MYRBETRIQ) 25 MG TB24 TABLET    One daily to help bladder control   PREDNISONE (DELTASONE) 10 MG TABLET    Take  4 each am x 2 days,   2 each am x 2 days,  1 each am x 2 days and stop   PROAIR HFA 108 (90 BASE) MCG/ACT INHALER    INHALE 1 TO 2 PUFFS INTO THE LUNGS EVERY 6 HOURS AS NEEDED FOR WHEEZING OR SHORTNESS OF BREATH   SERTRALINE (ZOLOFT) 100 MG TABLET    Take 1 tablet (100 mg total) by mouth daily.  Modified Medications   No medications on file  Discontinued Medications   No medications on file     Physical Exam:  Vitals:   07/04/16 1554  BP: (!) 152/80  Pulse: 81  Resp: 16  Temp: 97.9 F (36.6 C)  TempSrc: Oral  SpO2: 95%  Weight: 160 lb (72.6 kg)  Height: 5\' 5"  (1.651 m)   Body mass index is 26.63 kg/m.  Physical Exam  Constitutional: She is oriented to person, place, and time. She appears well-developed and well-nourished. No distress.  Eyes:  Corrective lenses.  Cardiovascular: Normal rate, regular rhythm, normal heart sounds and intact distal pulses.  Exam reveals no gallop and no friction rub.   No murmur heard. Pulmonary/Chest: No respiratory distress. She has wheezes. She has no rales. She exhibits no tenderness.  Neurological: She is alert and oriented to person, place, and time. No cranial nerve deficit.  Skin: Skin is warm and dry. She is not diaphoretic.  Redness noted under left arm area with center rounded scab with redness that extends outwards Also with red area lateral chest    Labs reviewed: Basic Metabolic Panel:  Recent Labs  08/27/15 0821 04/17/16 1020  NA 141 141  K 4.4 4.3  CL 98 102  CO2 25 29  GLUCOSE 106* 100*  BUN 12 10  CREATININE 0.88 0.86  CALCIUM 9.5 9.6  TSH 3.930 8.13*   Liver Function Tests:  Recent Labs  08/27/15 0821 04/17/16 1020  AST 14 14  ALT 11 13  ALKPHOS 119* 102  BILITOT 0.4 0.7  PROT 6.4 6.7  ALBUMIN 4.4 4.1   No results for input(s): LIPASE, AMYLASE in the last 8760 hours. No results for  input(s): AMMONIA in the last 8760 hours. CBC:  Recent Labs  04/17/16 1020  WBC 8.3  NEUTROABS 4,731  HGB 15.7*  HCT 46.1*  MCV 94.1  PLT 287   Lipid Panel:  Recent Labs  04/17/16 1020  CHOL 170  HDL 60  LDLCALC 84  TRIG 132  CHOLHDL 2.8   TSH:  Recent Labs  08/27/15 0821 04/17/16 1020  TSH 3.930 8.13*   A1C: Lab Results  Component Value Date   HGBA1C 5.9 (H) 06/17/2013  Assessment/Plan 1. Tick bite, initial encounter - doxycycline (VIBRA-TABS) 100 MG tablet; Take 1 tablet (100 mg total) by mouth 2 (two) times daily.  Dispense: 20 tablet; Refill: 0  2. Cellulitis of trunk, unspecified site of trunk Surrounding tick bite, area marked with pen. To start doxycyline 100 mg BID for 10 days. To start today. To use probiotic BID Educated if area extends while on antibiotic if she becomes febrile, achy or with other symptoms to seek medical attention  - doxycycline (VIBRA-TABS) 100 MG tablet; Take 1 tablet (100 mg total) by mouth 2 (two) times daily.  Dispense: 20 tablet; Refill: 0  To follow up in 1 week  Joshiah Traynham K. Harle Battiest  [facility] [phone] 8 am - 5 pm) [phone] (after hours)

## 2016-07-04 NOTE — Patient Instructions (Addendum)
Take doxycyline 100 mg by mouth twice daily-- start dose tonight  Take with food Take with florastore twice daily while on antibiotic   Notify if red area becomes larger, fevers, chills, bodyaches, rash occurs while on antibiotic   Follow up in 1 week to recheck    Cellulitis, Adult Cellulitis is a skin infection. The infected area is usually red and sore. This condition occurs most often in the arms and lower legs. It is very important to get treated for this condition. Follow these instructions at home:  Take over-the-counter and prescription medicines only as told by your doctor.  If you were prescribed an antibiotic medicine, take it as told by your doctor. Do not stop taking the antibiotic even if you start to feel better.  Drink enough fluid to keep your pee (urine) clear or pale yellow.  Do not touch or rub the infected area.  Raise (elevate) the infected area above the level of your heart while you are sitting or lying down.  Place warm or cold wet cloths (warm or cold compresses) on the infected area. Do this as told by your doctor.  Keep all follow-up visits as told by your doctor. This is important. These visits let your doctor make sure your infection is not getting worse. Contact a doctor if:  You have a fever.  Your symptoms do not get better after 1-2 days of treatment.  Your bone or joint under the infected area starts to hurt after the skin has healed.  Your infection comes back. This can happen in the same area or another area.  You have a swollen bump in the infected area.  You have new symptoms.  You feel ill and also have muscle aches and pains. Get help right away if:  Your symptoms get worse.  You feel very sleepy.  You throw up (vomit) or have watery poop (diarrhea) for a long time.  There are red streaks coming from the infected area.  Your red area gets larger.  Your red area turns darker. This information is not intended to replace  advice given to you by your health care provider. Make sure you discuss any questions you have with your health care provider. Document Released: 09/06/2007 Document Revised: 08/26/2015 Document Reviewed: 01/27/2015 Elsevier Interactive Patient Education  2017 [employer].

## 2016-07-11 ENCOUNTER — Ambulatory Visit: Payer: Medicare Other | Admitting: Nurse Practitioner

## 2016-07-11 ENCOUNTER — Encounter: Payer: Self-pay | Admitting: Nurse Practitioner

## 2016-07-11 ENCOUNTER — Ambulatory Visit (INDEPENDENT_AMBULATORY_CARE_PROVIDER_SITE_OTHER): Payer: Medicare Other | Admitting: Nurse Practitioner

## 2016-07-11 VITALS — BP 128/78 | HR 83 | Temp 98.0°F | Resp 18 | Ht 65.0 in | Wt 158.0 lb

## 2016-07-11 DIAGNOSIS — L03319 Cellulitis of trunk, unspecified: Secondary | ICD-10-CM | POA: Diagnosis not present

## 2016-07-11 NOTE — Progress Notes (Signed)
Careteam: Patient Care Team: Estill Dooms, MD as PCP - General (Internal Medicine)  Advanced Directive information Does Patient Have a Medical Advance Directive?: Yes, Type of Advance Directive: [place];Living will  Allergies  Allergen Reactions  . Cafergot   . Codeine   . Fenoprofen Calcium   . Nalfon [Fenoprofen]     Chief Complaint  Patient presents with  . Follow-up    Pt is being seen to follow up on tick bite.      HPI: Patient is a 71 y.o. female seen in the office today to follow up tick bite. Pt was seen last week due to cellulitis from tick bite. Has been taking doxycycline due to this and reports at first the redness got worse but soon after significantly improved. Today redness has resolved and lump under arm pit has improved.   Review of Systems:  Review of Systems  Skin: Positive for color change (has improved ).    Past Medical History:  Diagnosis Date  . Chronic airway obstruction, not elsewhere classified   . Depressive disorder   . Depressive disorder, not elsewhere classified   . Emphysema   . Enthesopathy of ankle and tarsus, unspecified   . HTN (hypertension)   . Hyperlipidemia   . Hypertension   . Hypopotassemia   . Leukocytosis   . Migraine, unspecified, without mention of intractable migraine without mention of status migrainosus   . Nonspecific (abnormal) findings on radiological and other examination of skull and head   . Osteoarthrosis, unspecified whether generalized or localized, unspecified site   . Other emphysema ([place])   . Other specified disease of white blood cells   . Palpitations   . Tobacco abuse    Past Surgical History:  Procedure Laterality Date  . CESAREAN SECTION    . COLONOSCOPY  08/14/2008   Dr.. Delfin Edis   Social History:   reports that she has been smoking Cigarettes.  She has a 28.00 pack-year smoking history. She has never used smokeless tobacco. She reports that she does not drink  alcohol or use drugs.  Family History  Problem Relation Age of Onset  . Alzheimer's disease Father   . Heart disease Father   . Diabetes Father   . Skin cancer Father   . Heart disease Mother   . Breast cancer Mother     Medications: Patient's Medications  New Prescriptions   No medications on file  Previous Medications   ALPRAZOLAM (XANAX) 0.5 MG TABLET    TAKE 1 TABLET BY MOUTH UP TO TWICE DAILY FOR NERVOUSNESS OR SLEEP   ATORVASTATIN (LIPITOR) 40 MG TABLET    Take one tablet by mouth once daily for cholesterol   ATORVASTATIN (LIPITOR) 40 MG TABLET    TAKE 1 TABLET BY MOUTH DAILY FOR HIGH CHOLESTEROL   BUDESONIDE-FORMOTEROL (SYMBICORT) 160-4.5 MCG/ACT INHALER    Inhale 2 puffs by mouth first thing in the morning and 2 puffs about 12 hours later   CHOLECALCIFEROL (VITAMIN D3) 1000 UNITS CAPS    Take 1 capsule by mouth daily.     DOXYCYCLINE (VIBRA-TABS) 100 MG TABLET    Take 1 tablet (100 mg total) by mouth 2 (two) times daily.   LEVOTHYROXINE (SYNTHROID, LEVOTHROID) 75 MCG TABLET    TAKE 1 TABLET BY MOUTH DAILY FOR THYROID SUPPLEMENT   LOSARTAN (COZAAR) 50 MG TABLET    TAKE 1 TABLET BY MOUTH ONCE DAILY FOR BLOOD PRESSURE   MIRABEGRON ER (MYRBETRIQ) 25 MG  TB24 TABLET    One daily to help bladder control   PREDNISONE (DELTASONE) 10 MG TABLET    Take  4 each am x 2 days,   2 each am x 2 days,  1 each am x 2 days and stop   PROAIR HFA 108 (90 BASE) MCG/ACT INHALER    INHALE 1 TO 2 PUFFS INTO THE LUNGS EVERY 6 HOURS AS NEEDED FOR WHEEZING OR SHORTNESS OF BREATH   SERTRALINE (ZOLOFT) 100 MG TABLET    Take 1 tablet (100 mg total) by mouth daily.  Modified Medications   No medications on file  Discontinued Medications   No medications on file     Physical Exam:  Vitals:   07/11/16 1421  BP: 128/78  Pulse: 83  Resp: 18  Temp: 98 F (36.7 C)  TempSrc: Oral  SpO2: 94%  Weight: 158 lb (71.7 kg)  Height: 5\' 5"  (1.651 m)   Body mass index is 26.29 kg/m.  Physical Exam    Constitutional: She is oriented to person, place, and time. She appears well-developed and well-nourished. No distress.  Eyes:  Corrective lenses.  Cardiovascular: Normal rate, regular rhythm, normal heart sounds and intact distal pulses.  Exam reveals no gallop and no friction rub.   No murmur heard. Pulmonary/Chest: No respiratory distress. She has wheezes. She has no rales. She exhibits no tenderness.  Neurological: She is alert and oriented to person, place, and time. No cranial nerve deficit.  Skin: Skin is warm and dry. She is not diaphoretic.    Labs reviewed: Basic Metabolic Panel:  Recent Labs  08/27/15 0821 04/17/16 1020  NA 141 141  K 4.4 4.3  CL 98 102  CO2 25 29  GLUCOSE 106* 100*  BUN 12 10  CREATININE 0.88 0.86  CALCIUM 9.5 9.6  TSH 3.930 8.13*   Liver Function Tests:  Recent Labs  08/27/15 0821 04/17/16 1020  AST 14 14  ALT 11 13  ALKPHOS 119* 102  BILITOT 0.4 0.7  PROT 6.4 6.7  ALBUMIN 4.4 4.1   No results for input(s): LIPASE, AMYLASE in the last 8760 hours. No results for input(s): AMMONIA in the last 8760 hours. CBC:  Recent Labs  04/17/16 1020  WBC 8.3  NEUTROABS 4,731  HGB 15.7*  HCT 46.1*  MCV 94.1  PLT 287   Lipid Panel:  Recent Labs  04/17/16 1020  CHOL 170  HDL 60  LDLCALC 84  TRIG 132  CHOLHDL 2.8   TSH:  Recent Labs  08/27/15 0821 04/17/16 1020  TSH 3.930 8.13*   A1C: Lab Results  Component Value Date   HGBA1C 5.9 (H) 06/17/2013     Assessment/Plan 1. Cellulitis of trunk, unspecified site of trunk Has improved, to complete 10 days of doxycycline, notify if fever, rash, or worsening redness occurs.   Carlos American. Harle Battiest  [facility] [phone] 8 am - 5 pm) [phone] (after hours)

## 2016-07-31 ENCOUNTER — Other Ambulatory Visit: Payer: Medicare Other

## 2016-07-31 DIAGNOSIS — I10 Essential (primary) hypertension: Secondary | ICD-10-CM

## 2016-07-31 DIAGNOSIS — R739 Hyperglycemia, unspecified: Secondary | ICD-10-CM

## 2016-07-31 DIAGNOSIS — E038 Other specified hypothyroidism: Secondary | ICD-10-CM

## 2016-07-31 LAB — BASIC METABOLIC PANEL
BUN: 14 mg/dL (ref 7–25)
CHLORIDE: 102 mmol/L (ref 98–110)
CO2: 31 mmol/L (ref 20–31)
CREATININE: 0.88 mg/dL (ref 0.60–0.93)
Calcium: 9.2 mg/dL (ref 8.6–10.4)
Glucose, Bld: 93 mg/dL (ref 65–99)
Potassium: 4.5 mmol/L (ref 3.5–5.3)
SODIUM: 141 mmol/L (ref 135–146)

## 2016-07-31 LAB — TSH: TSH: 8.95 m[IU]/L — AB

## 2016-08-02 ENCOUNTER — Encounter: Payer: Self-pay | Admitting: Internal Medicine

## 2016-08-02 ENCOUNTER — Ambulatory Visit (INDEPENDENT_AMBULATORY_CARE_PROVIDER_SITE_OTHER): Payer: Medicare Other | Admitting: Internal Medicine

## 2016-08-02 VITALS — BP 130/94 | HR 75 | Temp 97.6°F | Ht 65.0 in | Wt 160.0 lb

## 2016-08-02 DIAGNOSIS — N3941 Urge incontinence: Secondary | ICD-10-CM | POA: Diagnosis not present

## 2016-08-02 DIAGNOSIS — J4489 Other specified chronic obstructive pulmonary disease: Secondary | ICD-10-CM

## 2016-08-02 DIAGNOSIS — E038 Other specified hypothyroidism: Secondary | ICD-10-CM | POA: Diagnosis not present

## 2016-08-02 DIAGNOSIS — J449 Chronic obstructive pulmonary disease, unspecified: Secondary | ICD-10-CM

## 2016-08-02 DIAGNOSIS — R05 Cough: Secondary | ICD-10-CM

## 2016-08-02 DIAGNOSIS — F17210 Nicotine dependence, cigarettes, uncomplicated: Secondary | ICD-10-CM | POA: Diagnosis not present

## 2016-08-02 DIAGNOSIS — I10 Essential (primary) hypertension: Secondary | ICD-10-CM | POA: Diagnosis not present

## 2016-08-02 DIAGNOSIS — E7849 Other hyperlipidemia: Secondary | ICD-10-CM

## 2016-08-02 DIAGNOSIS — R739 Hyperglycemia, unspecified: Secondary | ICD-10-CM | POA: Diagnosis not present

## 2016-08-02 DIAGNOSIS — E784 Other hyperlipidemia: Secondary | ICD-10-CM

## 2016-08-02 DIAGNOSIS — R053 Chronic cough: Secondary | ICD-10-CM

## 2016-08-02 MED ORDER — FESOTERODINE FUMARATE ER 8 MG PO TB24: ORAL_TABLET | ORAL | 5 refills | Status: DC

## 2016-08-02 MED ORDER — DEXTROMETHORPHAN POLISTIREX ER 30 MG/5ML PO SUER: ORAL | 5 refills | Status: DC

## 2016-08-02 MED ORDER — PREDNISONE 10 MG PO TABS: ORAL_TABLET | ORAL | 0 refills | Status: DC

## 2016-08-02 MED ORDER — LEVOTHYROXINE SODIUM 112 MCG PO TABS: 112.0000 ug | ORAL_TABLET | Freq: Every day | ORAL | 3 refills | Status: DC

## 2016-08-02 NOTE — Progress Notes (Signed)
Facility  [place]    Place of Service:   OFFICE    Allergies  Allergen Reactions  . Cafergot   . Codeine   . Fenoprofen Calcium   . Nalfon [Fenoprofen]     Chief Complaint  Patient presents with  . Medical Management of Chronic Issues    3 month medication management blood pressure, thyroid, hyperglycemia, cholesterol, urine incontinence, review labs. Stopped the Myrbetriq it didn't seem to really work.     HPI:  Last seen 07/11/16 for cellulitis of the trunk from a tick bite. This has completely resolved.  Essential hypertension - controlld  Other specified hypothyroidism - rising TSH  Hyperglycemia - controlled on diet  Other hyperlipidemia - no recent lab. Controlled in jan 2018  Urge incontinence of urine - failed on Myrbetriq.  Cigarette smoker - continues at about 1/2 PPD  Chronic cough - related to asthmatic bronchitis. Inhalers help. Has Mucinex DM tabs  Asthmatic bronchitis , chronic (HCC) - chronic. Aggravated by smoking and evvironmental allegans. She has given up her horses.  Spasm of toes   Medications: Patient's Medications  New Prescriptions   No medications on file  Previous Medications   ALPRAZOLAM (XANAX) 0.5 MG TABLET    TAKE 1 TABLET BY MOUTH UP TO TWICE DAILY FOR NERVOUSNESS OR SLEEP   ATORVASTATIN (LIPITOR) 40 MG TABLET    Take one tablet by mouth once daily for cholesterol   BUDESONIDE-FORMOTEROL (SYMBICORT) 160-4.5 MCG/ACT INHALER    Inhale 2 puffs by mouth first thing in the morning and 2 puffs about 12 hours later   CHOLECALCIFEROL (VITAMIN D3) 1000 UNITS CAPS    Take 1 capsule by mouth daily.     LEVOTHYROXINE (SYNTHROID, LEVOTHROID) 75 MCG TABLET    TAKE 1 TABLET BY MOUTH DAILY FOR THYROID SUPPLEMENT   LOSARTAN (COZAAR) 50 MG TABLET    TAKE 1 TABLET BY MOUTH ONCE DAILY FOR BLOOD PRESSURE   PREDNISONE (DELTASONE) 10 MG TABLET    Take  4 each am x 2 days,   2 each am x 2 days,  1 each am x 2 days and stop   PROAIR HFA 108 (90 BASE)  MCG/ACT INHALER    INHALE 1 TO 2 PUFFS INTO THE LUNGS EVERY 6 HOURS AS NEEDED FOR WHEEZING OR SHORTNESS OF BREATH   SERTRALINE (ZOLOFT) 100 MG TABLET    Take 1 tablet (100 mg total) by mouth daily.  Modified Medications   No medications on file  Discontinued Medications   ATORVASTATIN (LIPITOR) 40 MG TABLET    TAKE 1 TABLET BY MOUTH DAILY FOR HIGH CHOLESTEROL   DOXYCYCLINE (VIBRA-TABS) 100 MG TABLET    Take 1 tablet (100 mg total) by mouth 2 (two) times daily.   MIRABEGRON ER (MYRBETRIQ) 25 MG TB24 TABLET    One daily to help bladder control    Review of Systems  Constitutional: Positive for fatigue. Negative for activity change, appetite change, chills, diaphoresis, fever and unexpected weight change.  HENT: Positive for sore throat. Negative for congestion, ear discharge, ear pain, hearing loss, postnasal drip, rhinorrhea, tinnitus, trouble swallowing and voice change.   Eyes: Negative.  Negative for pain, redness, itching and visual disturbance.  Respiratory: Positive for cough, shortness of breath and wheezing. Negative for choking.        Tobacco abuse  Cardiovascular: Negative for chest pain, palpitations and leg swelling.  Gastrointestinal: Negative for abdominal distention, abdominal pain, constipation, diarrhea, nausea and vomiting.  Endocrine: Negative for cold  intolerance, heat intolerance, polydipsia, polyphagia and polyuria.       History hypothyroidism  Genitourinary: Negative for difficulty urinating, dysuria, flank pain, frequency, hematuria, pelvic pain, urgency and vaginal discharge.       Episodes of urinary incontinence. Improved on Myrbetriq.  Musculoskeletal: Negative for arthralgias, back pain, gait problem, myalgias, neck pain and neck stiffness.       Toes in the left foot spasm and separate at times. Rare leg cramps.  Skin: Negative for color change, pallor and rash.  Allergic/Immunologic: Negative.   Neurological: Positive for dizziness. Negative for tremors,  seizures, syncope, speech difficulty, weakness, light-headedness, numbness and headaches.       Absence spells  Hematological: Negative for adenopathy. Does not bruise/bleed easily.  Psychiatric/Behavioral: Positive for decreased concentration. Negative for agitation, behavioral problems, confusion, dysphoric mood, hallucinations, sleep disturbance and suicidal ideas. The patient is not nervous/anxious and is not hyperactive.        Feeling depressed sometimes.    Vitals:   08/02/16 1110  BP: (!) 130/94  Pulse: 75  Temp: 97.6 F (36.4 C)  TempSrc: Oral  SpO2: 92%  Weight: 160 lb (72.6 kg)  Height: '5\' 5"'  (1.651 m)   Body mass index is 26.63 kg/m. Wt Readings from Last 3 Encounters:  08/02/16 160 lb (72.6 kg)  07/11/16 158 lb (71.7 kg)  07/04/16 160 lb (72.6 kg)      Physical Exam  Constitutional: She is oriented to person, place, and time. She appears well-developed and well-nourished. No distress.  HENT:  Head: Normocephalic and atraumatic.  Right Ear: External ear normal.  Left Ear: External ear normal.  Nose: Nose normal.  No adenopathy.  Eyes: Conjunctivae and EOM are normal. Pupils are equal, round, and reactive to light. Left eye exhibits no discharge.  Corrective lenses.  Neck: Normal range of motion. Neck supple. No JVD present. No tracheal deviation present. No thyromegaly present.  Cardiovascular: Normal rate, regular rhythm, normal heart sounds and intact distal pulses.  Exam reveals no gallop and no friction rub.   No murmur heard. Pulmonary/Chest: She has wheezes. She has rales.  Bilateral bronchial rattle. Partial clearing with cough. Wheezing. Has dyspnea at rest and when talking.  Abdominal: Soft. Bowel sounds are normal. She exhibits no distension and no mass. There is no tenderness.  Genitourinary:  Genitourinary Comments: Goes to [facility].  Musculoskeletal: Normal range of motion. She exhibits no edema or tenderness.  Ganglion of th radial  side of the right wrist.  Lymphadenopathy:    She has no cervical adenopathy.  Neurological: She is alert and oriented to person, place, and time. No cranial nerve deficit. Coordination normal.  Skin: Skin is warm and dry. No rash noted. She is not diaphoretic. No erythema. No pallor.   lipoma of the right greater trochanter with some scarring around it secondary to previous removals. Small scar under right ribs from prior boil.  Psychiatric: She has a normal mood and affect. Her behavior is normal. Thought content normal.    Labs reviewed: Lab Summary Latest Ref Rng & Units 07/31/2016 04/17/2016  Hemoglobin 11.7 - 15.5 g/dL (None) 15.7(H)  Hematocrit 35.0 - 45.0 % (None) 46.1(H)  White count 3.8 - 10.8 K/uL (None) 8.3  Platelet count 140 - 400 K/uL (None) 287  Sodium 135 - 146 mmol/L 141 141  Potassium 3.5 - 5.3 mmol/L 4.5 4.3  Calcium 8.6 - 10.4 mg/dL 9.2 9.6  Phosphorus - (None) (None)  Creatinine 0.60 - 0.93 mg/dL 0.88  0.86  AST 10 - 35 U/L (None) 14  Alk Phos 33 - 130 U/L (None) 102  Bilirubin 0.2 - 1.2 mg/dL (None) 0.7  Glucose 65 - 99 mg/dL 93 100(H)  Cholesterol <200 mg/dL (None) 170  HDL cholesterol >50 mg/dL (None) 60  Triglycerides <150 mg/dL (None) 132  LDL Direct - (None) (None)  LDL Calc <100 mg/dL (None) 84  Total protein 6.1 - 8.1 g/dL (None) 6.7  Albumin 3.6 - 5.1 g/dL (None) 4.1  Some recent data might be hidden   Lab Results  Component Value Date   TSH 8.95 (H) 07/31/2016   TSH 8.13 (H) 04/17/2016   TSH 3.930 08/27/2015   Lab Results  Component Value Date   BUN 14 07/31/2016   BUN 10 04/17/2016   BUN 12 08/27/2015   Lab Results  Component Value Date   HGBA1C 5.9 (H) 06/17/2013    Assessment/Plan  1. Essential hypertension The current medical regimen is effective;  continue present plan and medications.  2. Other specified hypothyroidism - levothyroxine (SYNTHROID, LEVOTHROID) 112 MCG tablet; Take 1 tablet (112 mcg total) by mouth daily.   Dispense: 90 tablet; Refill: 3  3. Hyperglycemia controlled  4. Other hyperlipidemia Con trolled  5. Urge incontinence of urine Samples of- fesoterodine (TOVIAZ) 8 MG TB24 tablet; One daily to help control bladder.  Dispense: 30 tablet; Refill: 5  6. Cigarette smoker Stop smoking  7. Chronic cough - dextromethorphan (DELSYM) 30 MG/5ML liquid; 5 mL every 12 hours as needed to control cough  Dispense: 89 mL; Refill: 5  8. Asthmatic bronchitis , chronic (HCC) The current medical regimen is effective;  continue present plan and medications.

## 2016-08-16 ENCOUNTER — Ambulatory Visit (INDEPENDENT_AMBULATORY_CARE_PROVIDER_SITE_OTHER): Payer: Medicare Other | Admitting: Internal Medicine

## 2016-08-16 ENCOUNTER — Other Ambulatory Visit (INDEPENDENT_AMBULATORY_CARE_PROVIDER_SITE_OTHER): Payer: Medicare Other

## 2016-08-16 ENCOUNTER — Encounter: Payer: Self-pay | Admitting: Internal Medicine

## 2016-08-16 ENCOUNTER — Other Ambulatory Visit: Payer: Self-pay | Admitting: Internal Medicine

## 2016-08-16 VITALS — BP 118/74 | HR 87 | Ht 65.0 in | Wt 163.0 lb

## 2016-08-16 DIAGNOSIS — J449 Chronic obstructive pulmonary disease, unspecified: Secondary | ICD-10-CM

## 2016-08-16 DIAGNOSIS — L089 Local infection of the skin and subcutaneous tissue, unspecified: Secondary | ICD-10-CM

## 2016-08-16 DIAGNOSIS — F17210 Nicotine dependence, cigarettes, uncomplicated: Secondary | ICD-10-CM | POA: Diagnosis not present

## 2016-08-16 DIAGNOSIS — R0600 Dyspnea, unspecified: Secondary | ICD-10-CM

## 2016-08-16 LAB — PULMONARY FUNCTION TEST
DL/VA % pred: 78 %
DL/VA: 3.87 ml/min/mmHg/L
DLCO COR % PRED: 58 %
DLCO UNC % PRED: 61 %
DLCO UNC: 15.77 ml/min/mmHg
DLCO cor: 15.07 ml/min/mmHg
FEF 25-75 POST: 1.45 L/s
FEF 25-75 PRE: 1.13 L/s
FEF2575-%Change-Post: 28 %
FEF2575-%PRED-POST: 77 %
FEF2575-%Pred-Pre: 60 %
FEV1-%Change-Post: 8 %
FEV1-%PRED-POST: 61 %
FEV1-%Pred-Pre: 56 %
FEV1-POST: 1.42 L
FEV1-Pre: 1.31 L
FEV1FVC-%Change-Post: -9 %
FEV1FVC-%PRED-PRE: 103 %
FEV6-%CHANGE-POST: 19 %
FEV6-%PRED-POST: 68 %
FEV6-%Pred-Pre: 57 %
FEV6-Post: 2 L
FEV6-Pre: 1.67 L
FEV6FVC-%CHANGE-POST: 0 %
FEV6FVC-%Pred-Post: 104 %
FEV6FVC-%Pred-Pre: 105 %
FVC-%Change-Post: 20 %
FVC-%Pred-Post: 65 %
FVC-%Pred-Pre: 54 %
FVC-Post: 2.01 L
FVC-Pre: 1.67 L
PRE FEV1/FVC RATIO: 79 %
Post FEV1/FVC ratio: 71 %
Post FEV6/FVC ratio: 99 %
Pre FEV6/FVC Ratio: 100 %
RV % pred: 133 %
RV: 3.05 L
TLC % PRED: 95 %
TLC: 4.95 L

## 2016-08-16 LAB — CBC WITH DIFFERENTIAL/PLATELET
BASOS ABS: 0.1 10*3/uL (ref 0.0–0.1)
Basophils Relative: 1 % (ref 0.0–3.0)
Eosinophils Absolute: 0.5 10*3/uL (ref 0.0–0.7)
Eosinophils Relative: 4.7 % (ref 0.0–5.0)
HCT: 46.2 % — ABNORMAL HIGH (ref 36.0–46.0)
HEMOGLOBIN: 15.4 g/dL — AB (ref 12.0–15.0)
Lymphocytes Relative: 36.6 % (ref 12.0–46.0)
Lymphs Abs: 3.8 10*3/uL (ref 0.7–4.0)
MCHC: 33.4 g/dL (ref 30.0–36.0)
MCV: 92.7 fl (ref 78.0–100.0)
MONOS PCT: 8.4 % (ref 3.0–12.0)
Monocytes Absolute: 0.9 10*3/uL (ref 0.1–1.0)
Neutro Abs: 5.1 10*3/uL (ref 1.4–7.7)
Neutrophils Relative %: 49.3 % (ref 43.0–77.0)
Platelets: 264 10*3/uL (ref 150.0–400.0)
RBC: 4.99 Mil/uL (ref 3.87–5.11)
RDW: 13.7 % (ref 11.5–15.5)
WBC: 10.4 10*3/uL (ref 4.0–10.5)

## 2016-08-16 NOTE — Progress Notes (Signed)
Subjective:   Patient ID: Gabrielle White, female    DOB: 1945/08/15 .   MRN: [id number]    Brief patient profile:  79 yowf active smoker with AB/GOLD 0 copd by pfts 03/08/15   History of Present Illness  01/22/2015 acute extended re-establish ov/Liane Tribbey re:  AB/ still smoking  Chief Complaint  Patient presents with  . Pulmonary Consult    pt last seen in 2014. pt states DR. Green wanted her to follow up. pt states somethines her throat feels like it closes up and she cant breath. pt states she feels like she has to take really deep breaths. pt c/o chest tightness, and prod cough white in color.pt c/o thrush she thiks may come from the inhailers.     Last better breathing  x 4 weeks prior to OV  p using prednisone but benefit only for a few days p stopped despite maint rx with symbicort (though hfa poor)  Nl routine is symbicort 160 2 bid and maybe ventolin and occ brovana no recent duoneb need  In retrospect really hasn't been able to really stay well x 4 y Cough is harsh and congested worse day than noct  rec Start Prednisone 10mg  Take 4 for two days three for two days two for two days one for two days  Plan A =  Automatic = symbicort 160 Take 2 puffs first thing in am and then another 2 puffs about 12 hours later.  Work on Interior and spatial designer: Plan B = Backup  Only use your albuterol  Plan C = crisis  Only use nebulizer iprotropium-albuterol (duoneb) if you try the ventolin first and it fails to help > ok to use up to every 4 hours  Plan D = doctor call us if not improving  Plan E = ER > go there if all else fails Try prilosec otc 20mg   Take 30-60 min before first meal of the day and Pepcid ac (famotidine) 20 mg one @  bedtime    GERD (REFLUX) diet      03/08/2015  f/u ov/Fransheska Willingham re: COPD/ AB  GOLD 0/ still smoking  Chief Complaint  Patient presents with  . Follow-up    PFT done today. Pt states that her breathing has improved greatly. She is able to do more activities. Pt  c/o occasional cough that quickly resolves and occasional dyspnea with exertion. Pt denies wheeze/CP/tightness.   rec The key is to stop smoking completely before smoking completely stops you!  Ok to stop the pm dose of symbicort 160 and add back if any problems     05/15/2016  f/u ov/Isayah Ignasiak re: COPD 0 AB/ still smoking  Chief Complaint  Patient presents with  . Follow-up    Breathing has improved- relates to air purifier. She states that she is also coughing much less. She has been using proair about 2 x daily on average.    took 6 day course of prednisone and helped a lot  But not good about taking symb and breathing gradually worse since stopped pred and regular use of symbiocort and also noted no saba p pred and gradually needing more rec The key is to stop smoking completely before smoking completely stops you!     08/16/2016  f/u ov/Cailah Reach re:   COPD GOLD 0 / still smoking symb 160 2bid  Chief Complaint  Patient presents with  . Follow-up    PFT's done today. She is using rescue inhaler maybe once per day on  average. Breathing is doing okay today. She states she has good and bad days.      some cough/ congestion in am but really no excess/ purulent sputum or mucus plugs    Doe = MMRC2 = can't walk a nl pace on a flat grade s sob but does fine slow and flat  No obvious day to day or daytime variability or assoc   hemoptysis or cp or chest tightness, subjective wheeze or overt sinus or hb symptoms. No unusual exp hx or h/o childhood pna/ asthma or knowledge of premature birth.  Sleeping ok without nocturnal  or early am exacerbation  of respiratory  c/o's or need for noct saba. Also denies any obvious fluctuation of symptoms with weather or environmental changes or other aggravating or alleviating factors except as outlined above   Current Medications, Allergies, Complete Past Medical History, Past Surgical History, Family History, and Social History were reviewed in [facility] record.  ROS  The following are not active complaints unless bolded sore throat, dysphagia, dental problems, itching, sneezing,  nasal congestion or excess/ purulent secretions, ear ache,   fever, chills, sweats, unintended wt loss, classically pleuritic or exertional cp,  orthopnea pnd or leg swelling, presyncope, palpitations, abdominal pain, anorexia, nausea, vomiting, diarrhea  or change in bowel or bladder habits, change in stools or urine, dysuria,hematuria,  rash, arthralgias, visual complaints, headache, numbness, weakness or ataxia or problems with walking or coordination,  change in mood/affect or memory.               Objective:   Physical Exam GEN: A/Ox3; pleasant , NAD, ambulatory well nourished     Vital signs reviewed - Note on arrival 02 sats  95% on  RA      01/22/2015      156 > 03/08/2015   162 > 06/07/2015  170 > 03/20/2016 160 > 05/15/2016   162 >   08/16/2016 163     01/16/13 156 lb (70.761 kg)  01/08/13 155 lb 3.2 oz (70.398 kg)  12/20/12 157 lb 9.6 oz (71.487 kg)       HEENT:  [place]/AT,  EACs-clear, TMs-wnl, NOSE-clear, THROAT-clear, no lesions, no postnasal drip or exudate noted.   NECK:  Supple w/ fair ROM; no JVD; normal carotid impulses w/o bruits; no thyromegaly or nodules palpated; no lymphadenopathy.    RESP   Minimal insp and exp rhonchi bilaterally  CARD:  RRR, no m/r/g  , no peripheral edema, pulses intact, no cyanosis or clubbing.  GI:   Soft & nt; nml bowel sounds; no organomegaly or masses detected.   Musco: Warm bil, no deformities or joint swelling noted.   Neuro: alert, no focal deficits noted.    Skin: Warm, single pustule L chest wall at bra line with surrounding erythyma x quarter size      CXR PA and Lateral:   05/15/2016 :    I personally reviewed images and agree with radiology impression as follows:    Mediastinum and hilar structures normal. Cardiomegaly with normal pulmonary vascularity. Mild right base  subsegmental atelectasis. No pleural effusion or pneumothorax. Diffuse thoracic spine osteopenia degenerative change.     Assessment & Plan:

## 2016-08-16 NOTE — Patient Instructions (Signed)
Plan A = Automatic = symbicort 160 Take 2 puffs first thing in am and then another 2 puffs about 12 hours later.      Plan B = Backup Only use your albuterol as a rescue medication to be used if you can't catch your breath by resting or doing a relaxed purse lip breathing pattern.  - The less you use it, the better it will work when you need it. - Ok to use the inhaler up to 2 puffs  every 4 hours if you must but call for appointment if use goes up over your usual need - Don't leave home without it !!  (think of it like the spare tire for your car)    Please remember to go to the lab department downstairs in the basement  for your tests - we will call you with the results when they are available.  Please schedule a follow up visit in 3 months but call sooner if needed

## 2016-08-16 NOTE — Progress Notes (Signed)
PFT done today. 

## 2016-08-17 DIAGNOSIS — L089 Local infection of the skin and subcutaneous tissue, unspecified: Secondary | ICD-10-CM | POA: Insufficient documentation

## 2016-08-17 LAB — RESPIRATORY ALLERGY PROFILE REGION II ~~LOC~~
Allergen, A. alternata, m6: <0.1 kU/L
Allergen, C. Herbarum, M2: <0.1 kU/L
Allergen, Cedar tree, t12: <0.1 kU/L
Allergen, Comm Silver Birch, t9: <0.1 kU/L
Allergen, Cottonwood, t14: <0.1 kU/L
Allergen, Mouse Urine Protein, e78: <0.1 kU/L
Allergen, Mulberry, t76: <0.1 kU/L
Allergen, P. notatum, m1: <0.1 kU/L
Bermuda Grass: <0.1 kU/L
Box Elder IgE: <0.1 kU/L
Cockroach: <0.1 kU/L
Common Ragweed: <0.1 kU/L
Dog Dander: <0.1 kU/L
Elm IgE: <0.1 kU/L
IgE (Immunoglobulin E), Serum: 99 kU/L (ref <115)
Sheep Sorrel IgE: <0.1 kU/L

## 2016-08-17 NOTE — Assessment & Plan Note (Signed)
>   3min  I took an extended  opportunity with this patient to outline the consequences of continued cigarette use  in airway disorders based on all the data we have from the multiple national lung health studies (perfomed over decades at millions of dollars in cost)  indicating that smoking cessation, not choice of inhalers or physicians, is the most important aspect of her care.   

## 2016-08-17 NOTE — Assessment & Plan Note (Signed)
rec warm soaks for now, Follow up per Primary Care planned

## 2016-08-17 NOTE — Assessment & Plan Note (Addendum)
-   PFTs  11/18/10  FEV1  2.0 (95%) ratio 73 and no change p saba and DLCO 75 corrects to 100% -PFT's  03/08/2015  FEV1 1.85 (78 % ) ratio 73  p 5 % improvement from saba with DLCO  63 % corrects to 72 % for alv volume and erv 17   - added flutter 06/07/2015  - 05/15/2016  After extensive coaching HFA effectiveness =    75%   - PFT's  08/16/2016  FEV1 1.42 (61 % ) ratio 71  p 8 % improvement FEV1 (and 20% FVC) from saba p symb 160  prior to study with DLCO  61/58 % corrects to 78 % for alv volume     - Allergy profile 08/16/2016 >  Eos 0. /  IgE    - 08/16/2016  After extensive coaching HFA effectiveness =    90% from baseline of 75 (short Ti)     I did allergy profile at pts request but her problem is clearly smoking (see separate a/p)  pfts show the ab component is well managed despite smoking on symb 160 2bid and no change in rx needed though if stops smoking would hope to at least taper to the 80 2bid rx  I had an extended discussion with the patient reviewing all relevant studies completed to date and  lasting 15 to 20 minutes of a 25 minute visit    Each maintenance medication was reviewed in detail including most importantly the difference between maintenance and prns and under what circumstances the prns are to be triggered using an action plan format that is not reflected in the computer generated alphabetically organized AVS.    Please see AVS for specific instructions unique to this visit that I personally wrote and verbalized to the the pt in detail and then reviewed with pt  by my nurse highlighting any  changes in therapy recommended at today's visit to their plan of care.

## 2016-08-18 ENCOUNTER — Telehealth: Payer: Self-pay | Admitting: Internal Medicine

## 2016-08-18 NOTE — Telephone Encounter (Signed)
Pt aware of results and voiced her understanding. Nothing further needed.   Notes recorded by Tanda Rockers, MD on 08/18/2016 at 1:15 PM EDT Call patient : Studies are unremarkable, no change in recs

## 2016-08-18 NOTE — Progress Notes (Signed)
LMTCB

## 2016-08-22 ENCOUNTER — Encounter: Payer: Self-pay | Admitting: Nurse Practitioner

## 2016-08-22 ENCOUNTER — Ambulatory Visit (INDEPENDENT_AMBULATORY_CARE_PROVIDER_SITE_OTHER): Payer: Medicare Other | Admitting: Nurse Practitioner

## 2016-08-22 VITALS — BP 112/72 | HR 83 | Temp 98.3°F | Resp 17 | Ht 65.0 in | Wt 161.2 lb

## 2016-08-22 DIAGNOSIS — H029 Unspecified disorder of eyelid: Secondary | ICD-10-CM | POA: Diagnosis not present

## 2016-08-22 DIAGNOSIS — J449 Chronic obstructive pulmonary disease, unspecified: Secondary | ICD-10-CM

## 2016-08-22 MED ORDER — BUDESONIDE-FORMOTEROL FUMARATE 160-4.5 MCG/ACT IN AERO: INHALATION_SPRAY | RESPIRATORY_TRACT | 5 refills | Status: DC

## 2016-08-22 NOTE — Progress Notes (Signed)
Careteam: Patient Care Team: Estill Dooms, MD as PCP - General (Internal Medicine)  Advanced Directive information Does Patient Have a Medical Advance Directive?: Yes, Type of Advance Directive: [place]Living will  Allergies  Allergen Reactions  . Cafergot   . Codeine   . Fenoprofen Calcium   . Nalfon [Fenoprofen]     Chief Complaint  Patient presents with  . Acute Visit    Pt is being seen due to painful spot on right eyelid that came up 2 days ago.      HPI: Patient is a 71 y.o. female seen in the office today due to painful spot on right eyelid. Has been going on for 2 days and was worse last night. Red, swollen and painful. It was better today but still feels uncomfortable. Thought it was the beginning stages of a stye.  No fevers or chills. No visual changes.   Review of Systems:  Review of Systems  Constitutional: Negative for chills and fever.  Eyes: Negative for blurred vision, double vision, photophobia, pain, discharge and redness.       Redness and swelling to right upper lid  Skin: Negative for itching and rash.    Past Medical History:  Diagnosis Date  . Chronic airway obstruction, not elsewhere classified   . Depressive disorder   . Depressive disorder, not elsewhere classified   . Emphysema   . Enthesopathy of ankle and tarsus, unspecified   . HTN (hypertension)   . Hyperlipidemia   . Hypertension   . Hypopotassemia   . Leukocytosis   . Migraine, unspecified, without mention of intractable migraine without mention of status migrainosus   . Nonspecific (abnormal) findings on radiological and other examination of skull and head   . Osteoarthrosis, unspecified whether generalized or localized, unspecified site   . Other emphysema ([place])   . Other specified disease of white blood cells   . Palpitations   . Tobacco abuse    Past Surgical History:  Procedure Laterality Date  . CESAREAN SECTION    . COLONOSCOPY  08/14/2008   Dr.. Delfin Edis   Social History:   reports that she has been smoking Cigarettes.  She has a 28.00 pack-year smoking history. She has never used smokeless tobacco. She reports that she does not drink alcohol or use drugs.  Family History  Problem Relation Age of Onset  . Alzheimer's disease Father   . Heart disease Father   . Diabetes Father   . Skin cancer Father   . Heart disease Mother   . Breast cancer Mother     Medications: Patient's Medications  New Prescriptions   No medications on file  Previous Medications   ALPRAZOLAM (XANAX) 0.5 MG TABLET    TAKE 1 TABLET BY MOUTH UP TO TWICE DAILY FOR NERVOUSNESS OR SLEEP   ATORVASTATIN (LIPITOR) 40 MG TABLET    Take one tablet by mouth once daily for cholesterol   BUDESONIDE-FORMOTEROL (SYMBICORT) 160-4.5 MCG/ACT INHALER    Inhale 2 puffs by mouth first thing in the morning and 2 puffs about 12 hours later   CHOLECALCIFEROL (VITAMIN D3) 1000 UNITS CAPS    Take 1 capsule by mouth daily.     DEXTROMETHORPHAN (DELSYM) 30 MG/5ML LIQUID    5 mL every 12 hours as needed to control cough   FESOTERODINE (TOVIAZ) 8 MG TB24 TABLET    One daily to help control bladder.   LEVOTHYROXINE (SYNTHROID, LEVOTHROID) 112 MCG TABLET    Take  1 tablet (112 mcg total) by mouth daily.   LOSARTAN (COZAAR) 50 MG TABLET    TAKE 1 TABLET BY MOUTH ONCE DAILY FOR BLOOD PRESSURE   PROAIR HFA 108 (90 BASE) MCG/ACT INHALER    INHALE 1 TO 2 PUFFS INTO THE LUNGS EVERY 6 HOURS AS NEEDED FOR WHEEZING OR SHORTNESS OF BREATH   SERTRALINE (ZOLOFT) 100 MG TABLET    Take 1 tablet (100 mg total) by mouth daily.  Modified Medications   No medications on file  Discontinued Medications   No medications on file     Physical Exam:  Vitals:   08/22/16 1131  BP: 112/72  Pulse: 83  Resp: 17  Temp: 98.3 F (36.8 C)  TempSrc: Oral  SpO2: 96%  Weight: 161 lb 3.2 oz (73.1 kg)  Height: 5\' 5"  (1.651 m)   Body mass index is 26.83 kg/m.  Physical Exam  Constitutional: She  appears well-developed and well-nourished. No distress.  Eyes: Conjunctivae and EOM are normal. Pupils are equal, round, and reactive to light. Right eye exhibits no exudate. No foreign body present in the right eye. Left eye exhibits no exudate. No foreign body present in the left eye.  Right upper lid with mild erythema, edema and tenderness. No exudate, discharge or foreign body  Pulmonary/Chest: Effort normal. She has wheezes. She has rales.  Skin: She is not diaphoretic.    Labs reviewed: Basic Metabolic Panel:  Recent Labs  08/27/15 0821 04/17/16 1020 07/31/16 0809  NA 141 141 141  K 4.4 4.3 4.5  CL 98 102 102  CO2 25 29 31   GLUCOSE 106* 100* 93  BUN 12 10 14   CREATININE 0.88 0.86 0.88  CALCIUM 9.5 9.6 9.2  TSH 3.930 8.13* 8.95*   Liver Function Tests:  Recent Labs  08/27/15 0821 04/17/16 1020  AST 14 14  ALT 11 13  ALKPHOS 119* 102  BILITOT 0.4 0.7  PROT 6.4 6.7  ALBUMIN 4.4 4.1   No results for input(s): LIPASE, AMYLASE in the last 8760 hours. No results for input(s): AMMONIA in the last 8760 hours. CBC:  Recent Labs  04/17/16 1020 08/16/16 1712  WBC 8.3 10.4  NEUTROABS 4,731 5.1  HGB 15.7* 15.4*  HCT 46.1* 46.2*  MCV 94.1 92.7  PLT 287 264.0   Lipid Panel:  Recent Labs  04/17/16 1020  CHOL 170  HDL 60  LDLCALC 84  TRIG 132  CHOLHDL 2.8   TSH:  Recent Labs  08/27/15 0821 04/17/16 1020 07/31/16 0809  TSH 3.930 8.13* 8.95*   A1C: Lab Results  Component Value Date   HGBA1C 5.9 (H) 06/17/2013     Assessment/Plan 1. Asthmatic bronchitis , chronic (HCC) Has followed with pulmonary, refill provided - budesonide-formoterol (SYMBICORT) 160-4.5 MCG/ACT inhaler; Inhale 2 puffs by mouth first thing in the morning and 2 puffs about 12 hours later  Dispense: 10.2 g; Refill: 5  2. Eyelid abnormality Could be early stages of stye however improvement in redness, swelling and tenderness.  To use baby shampoo washes with warm compresses 2-3  times daily until resolves.  To notify if worsens.    Carlos American. Harle Battiest  [facility] [phone] 8 am - 5 pm) [phone] (after hours)

## 2016-08-22 NOTE — Patient Instructions (Addendum)
Wash lids with Baby shampoo and warm compress 2-3 times daily until resolved Notify if eye becomes swollen or red or drainage occurs

## 2016-09-08 ENCOUNTER — Other Ambulatory Visit: Payer: Self-pay | Admitting: Internal Medicine

## 2016-09-25 ENCOUNTER — Other Ambulatory Visit: Payer: Self-pay | Admitting: Internal Medicine

## 2016-10-23 ENCOUNTER — Other Ambulatory Visit: Payer: Self-pay | Admitting: Internal Medicine

## 2016-10-24 ENCOUNTER — Other Ambulatory Visit: Payer: Self-pay | Admitting: Internal Medicine

## 2016-11-03 ENCOUNTER — Other Ambulatory Visit: Payer: Self-pay | Admitting: Internal Medicine

## 2016-11-20 ENCOUNTER — Ambulatory Visit (INDEPENDENT_AMBULATORY_CARE_PROVIDER_SITE_OTHER): Payer: Medicare Other | Admitting: Internal Medicine

## 2016-11-20 ENCOUNTER — Encounter: Payer: Self-pay | Admitting: Internal Medicine

## 2016-11-20 VITALS — BP 114/80 | HR 85 | Ht 65.0 in | Wt 161.0 lb

## 2016-11-20 DIAGNOSIS — R938 Abnormal findings on diagnostic imaging of other specified body structures: Secondary | ICD-10-CM

## 2016-11-20 DIAGNOSIS — J449 Chronic obstructive pulmonary disease, unspecified: Secondary | ICD-10-CM

## 2016-11-20 DIAGNOSIS — J4489 Other specified chronic obstructive pulmonary disease: Secondary | ICD-10-CM

## 2016-11-20 DIAGNOSIS — F17210 Nicotine dependence, cigarettes, uncomplicated: Secondary | ICD-10-CM | POA: Diagnosis not present

## 2016-11-20 DIAGNOSIS — R9389 Abnormal findings on diagnostic imaging of other specified body structures: Secondary | ICD-10-CM

## 2016-11-20 MED ORDER — BUDESONIDE-FORMOTEROL FUMARATE 160-4.5 MCG/ACT IN AERO: 2.0000 | INHALATION_SPRAY | Freq: Two times a day (BID) | RESPIRATORY_TRACT | 0 refills | Status: DC

## 2016-11-20 MED ORDER — PREDNISONE 10 MG PO TABS: ORAL_TABLET | ORAL | 5 refills | Status: DC

## 2016-11-20 NOTE — Patient Instructions (Addendum)
Plan A = Automatic = symbicort 160 Take 2 puffs first thing in am and then another 2 puffs about 12 hours later.   Work on inhaler technique:  relax and gently blow all the way out then take a nice smooth deep breath back in, triggering the inhaler at same time you start breathing in.  Hold for up to 5 seconds if you can. Blow out thru nose. Rinse and gargle with water when done   Plan B = Backup Only use your albuterol as a rescue medication to be used if you can't catch your breath by resting or doing a relaxed purse lip breathing pattern.  - The less you use it, the better it will work when you need it. - Ok to use the inhaler up to 2 puffs  every 4 hours if you must but call for appointment if use goes up over your usual need - Don't leave home without it !!  (think of it like the spare tire for your car   Plan C= Cortisone If having to use more albuterol than usual or much worse breathing > Prednisone 10 mg take  4 each am x 2 days,   2 each am x 2 days,  1 each am x 2 days and stop    The key is to stop smoking completely before smoking completely stops you!    Please schedule a follow up visit in 6  months but call sooner if needed  - change ov to 4-6 weeks with cxr

## 2016-11-20 NOTE — Progress Notes (Signed)
Subjective:   Patient ID: Gabrielle White, female    DOB: 06/05/1945 .   MRN: [id number]    Brief patient profile:  60   yowf active smoker with AB/GOLD 0 copd by pfts 03/08/15   History of Present Illness  01/22/2015 acute extended re-establish ov/Gabrielle White re:  AB/ still smoking  Chief Complaint  Patient presents with  . Pulmonary Consult    pt last seen in 2014. pt states DR. Green wanted her to follow up. pt states somethines her throat feels like it closes up and she cant breath. pt states she feels like she has to take really deep breaths. pt c/o chest tightness, and prod cough white in color.pt c/o thrush she thiks may come from the inhailers.     Last better breathing  x 4 weeks prior to OV  p using prednisone but benefit only for a few days p stopped despite maint rx with symbicort (though hfa poor)  Nl routine is symbicort 160 2 bid and maybe ventolin and occ brovana no recent duoneb need  In retrospect really hasn't been able to really stay well x 4 y Cough is harsh and congested worse day than noct  rec Start Prednisone 10mg  Take 4 for two days three for two days two for two days one for two days  Plan A =  Automatic = symbicort 160 Take 2 puffs first thing in am and then another 2 puffs about 12 hours later.  Work on Interior and spatial designer: Plan B = Backup  Only use your albuterol  Plan C = crisis  Only use nebulizer iprotropium-albuterol (duoneb) if you try the ventolin first and it fails to help > ok to use up to every 4 hours  Plan D = doctor call us if not improving  Plan E = ER > go there if all else fails Try prilosec otc 20mg   Take 30-60 min before first meal of the day and Pepcid ac (famotidine) 20 mg one @  bedtime    GERD (REFLUX) diet      03/08/2015  f/u ov/Gabrielle White re: COPD/ AB  GOLD 0/ still smoking  Chief Complaint  Patient presents with  . Follow-up    PFT done today. Pt states that her breathing has improved greatly. She is able to do more activities.  Pt c/o occasional cough that quickly resolves and occasional dyspnea with exertion. Pt denies wheeze/CP/tightness.   rec The key is to stop smoking completely before smoking completely stops you!  Ok to stop the pm dose of symbicort 160 and add back if any problems     05/15/2016  f/u ov/Gabrielle White re: COPD 0 AB/ still smoking  Chief Complaint  Patient presents with  . Follow-up    Breathing has improved- relates to air purifier. She states that she is also coughing much less. She has been using proair about 2 x daily on average.    took 6 day course of prednisone and helped a lot  But not good about taking symb and breathing gradually worse since stopped pred and regular use of symbiocort and also noted no saba p pred and gradually needing more rec The key is to stop smoking completely before smoking completely stops you!     08/16/2016  f/u ov/Gabrielle White re:  COPD GOLD 0 / still smoking symb 160 2bid  Chief Complaint  Patient presents with  . Follow-up    PFT's done today. She is using rescue inhaler maybe once per day  on average. Breathing is doing okay today. She states she has good and bad days.   some cough/ congestion in am but really no excess/ purulent sputum or mucus plugs   Doe = MMRC2 = can't walk a nl pace on a flat grade s sob but does fine slow and flat rec Plan A = Automatic = symbicort 160 Take 2 puffs first thing in am and then another 2 puffs about 12 hours later.  Plan B = Backup Only use your albuterol as a rescue medication  Please remember to go to the lab department downstairs in the basement  for your tests - we will call you with the results when they are available.        11/20/2016  f/u ov/Gabrielle White re:   Copd GOLD 0/AB still smoking / maint symb 160 2bid  Chief Complaint  Patient presents with  . Follow-up    Breathing has been worse this summer- relates to humidity. She has been coughing some and wheezing. Cough is prod with clear to white sputum.  She is using  proair 2-3 x per day.    symptoms are worse when out in humidity x sev months / denies missing doses of symb but needing proair at least twice daily / also some noct and early am cough/ congestion   No obvious other patterns to  day to day or daytime variability or assoc  purulent sputum or mucus plugs or hemoptysis or cp or chest tightness,   or overt sinus or hb symptoms. No unusual exp hx or h/o childhood pna/ asthma or knowledge of premature birth.    Also denies any obvious fluctuation of symptoms with weather or environmental changes or other aggravating or alleviating factors except as outlined above   Current Medications, Allergies, Complete Past Medical History, Past Surgical History, Family History, and Social History were reviewed in [facility] record.  ROS  The following are not active complaints unless bolded sore throat, dysphagia, dental problems, itching, sneezing,  nasal congestion or excess/ purulent secretions, ear ache,   fever, chills, sweats, unintended wt loss, classically pleuritic or exertional cp,  orthopnea pnd or leg swelling, presyncope, palpitations, abdominal pain, anorexia, nausea, vomiting, diarrhea  or change in bowel or bladder habits, change in stools or urine, dysuria,hematuria,  rash, arthralgias, visual complaints, headache, numbness, weakness or ataxia or problems with walking or coordination,  change in mood/affect or memory.               Objective:   Physical Exam GEN: A/Ox3; pleasant , NAD, ambulatory well nourished     Vital signs reviewed - Note on arrival 02 sats  92% on  RA      01/22/2015      156 > 03/08/2015   162 > 06/07/2015  170 > 03/20/2016 160 > 05/15/2016   162 >   08/16/2016 163 > 11/20/2016  161     01/16/13 156 lb (70.761 kg)  01/08/13 155 lb 3.2 oz (70.398 kg)  12/20/12 157 lb 9.6 oz (71.487 kg)       HEENT:  [place]/AT,  EACs-clear, TMs-wnl, NOSE-clear, THROAT-clear, no lesions, no postnasal drip or exudate  noted.   NECK:  Supple w/ fair ROM; no JVD; normal carotid impulses w/o bruits; no thyromegaly or nodules palpated; no lymphadenopathy.    RESP   Prominent  insp and exp sonorous rhonchi bilaterally  CARD:  RRR, no m/r/g  , no peripheral edema, pulses intact, no cyanosis or  clubbing.  GI:   Soft & nt; nml bowel sounds; no organomegaly or masses detected.   Musco: Warm bil, no deformities or joint swelling noted.   Neuro: alert, no focal deficits noted.    Skin: Warm and dry , no rash          Assessment & Plan:

## 2016-11-21 ENCOUNTER — Telehealth: Payer: Self-pay | Admitting: *Deleted

## 2016-11-21 DIAGNOSIS — R9389 Abnormal findings on diagnostic imaging of other specified body structures: Secondary | ICD-10-CM | POA: Insufficient documentation

## 2016-11-21 NOTE — Telephone Encounter (Signed)
Patient returned call. I advised her that Dr. Melvyn Novas would like to see her back in 4-6 weeks for a CXR and to check and see how she is doing on medication.  She agreed and scheduled appt for 12/25/2016.  No call back is needed per patient.

## 2016-11-21 NOTE — Telephone Encounter (Signed)
LMTCB

## 2016-11-21 NOTE — Assessment & Plan Note (Signed)
>   3 min  Already tried multiple modalities to help her stop including the use of vapes  Does not really appear committed to quit at this point and quite fatalistic as well "If it kills me that's fine, I live alone and there's nothing else I enjoy"  >  Follow up per Primary Care planned

## 2016-11-21 NOTE — Telephone Encounter (Signed)
-----   Message from Tanda Rockers, MD sent at 11/21/2016  5:42 AM EDT -----  change ov to 4-6 weeks with cxr (I reviewed her records and realized she was due for f/u cxr and want to make sure she's responding to rx anyway

## 2016-11-21 NOTE — Assessment & Plan Note (Signed)
See cxr 05/16/16   ? SSA RLL   I reviewed the cxr again today and don't really see a problem but she is high risk so rec return for repeat cxr and discuss LDSCT then   Low-dose CT lung cancer screening is recommended for patients who are 43-72 years of age with a 30+ pack-year history of smoking, and who are currently smoking or quit <=15 years ago (so she's def a candidate if willing but based on comments re smoking she may not be)

## 2016-11-21 NOTE — Assessment & Plan Note (Signed)
-   PFTs  11/18/10  FEV1  2.0 (95%) ratio 73 and no change p saba and DLCO 75 corrects to 100% -PFT's  03/08/2015  FEV1 1.85 (78 % ) ratio 73  p 5 % improvement from saba with DLCO  63 % corrects to 72 % for alv volume and erv 17   - added flutter 06/07/2015  - PFT's  08/16/2016  FEV1 1.42 (61 % ) ratio 71  p 8 % improvement FEV1 (and 20% FVC) from saba p symb 160  prior to study with DLCO  61/58 % corrects to 78 % for alv volume    - Allergy profile 08/16/2016 >  Eos 0.5 /  IgE  99 RAST neg   - prednisone x 6 days cycles 11/20/2016 - 11/20/2016  After extensive coaching HFA effectiveness =    90%   DDX of  difficult airways management almost all start with A and  include Adherence, Ace Inhibitors, Acid Reflux, Active Sinus Disease, Alpha 1 Antitripsin deficiency, Anxiety masquerading as Airways dz,  ABPA,  Allergy(esp in young), Aspiration (esp in elderly), Adverse effects of meds,  Active smokers, A bunch of PE's (a small clot burden can't cause this syndrome unless there is already severe underlying pulm or vascular dz with poor reserve) plus two Bs  = Bronchiectasis and Beta blocker use..and one C= CHF   Adherence is always the initial "prime suspect" and is a multilayered concern that requires a "trust but verify" approach in every patient - starting with knowing how to use medications, especially inhalers, correctly, keeping up with refills and understanding the fundamental difference between maintenance and prns vs those medications only taken for a very short course and then stopped and not refilled.  - see hfa teaching  Active smoking top of the list of usual suspects  ? Allergy component > note neg profile. Will add short course pred prn as "plan C"   I had an extended discussion with the patient reviewing all relevant studies completed to date and  lasting 15 to 20 minutes of a 25 minute visit    Each maintenance medication was reviewed in detail including most importantly the difference between  maintenance and prns and under what circumstances the prns are to be triggered using an action plan format that is not reflected in the computer generated alphabetically organized AVS.    Please see AVS for specific instructions unique to this visit that I personally wrote and verbalized to the the pt in detail and then reviewed with pt  by my nurse highlighting any  changes in therapy recommended at today's visit to their plan of care.

## 2016-12-06 ENCOUNTER — Other Ambulatory Visit: Payer: Self-pay | Admitting: Internal Medicine

## 2016-12-08 ENCOUNTER — Other Ambulatory Visit: Payer: Self-pay | Admitting: Internal Medicine

## 2016-12-09 ENCOUNTER — Other Ambulatory Visit: Payer: Self-pay | Admitting: Internal Medicine

## 2016-12-11 ENCOUNTER — Other Ambulatory Visit: Payer: Self-pay | Admitting: *Deleted

## 2016-12-11 NOTE — Telephone Encounter (Signed)
Refill already sent for Sertraline. Received request

## 2016-12-12 ENCOUNTER — Ambulatory Visit (INDEPENDENT_AMBULATORY_CARE_PROVIDER_SITE_OTHER): Payer: Medicare Other | Admitting: Internal Medicine

## 2016-12-12 ENCOUNTER — Ambulatory Visit (INDEPENDENT_AMBULATORY_CARE_PROVIDER_SITE_OTHER)
Admission: RE | Admit: 2016-12-12 | Discharge: 2016-12-12 | Disposition: A | Discharge status: Home / Self Care | Payer: Medicare Other | Source: Ambulatory Visit | Attending: Internal Medicine | Admitting: Internal Medicine

## 2016-12-12 ENCOUNTER — Encounter: Payer: Self-pay | Admitting: Internal Medicine

## 2016-12-12 VITALS — BP 130/70 | HR 88 | Ht 65.0 in | Wt 162.0 lb

## 2016-12-12 DIAGNOSIS — R9389 Abnormal findings on diagnostic imaging of other specified body structures: Secondary | ICD-10-CM

## 2016-12-12 DIAGNOSIS — R938 Abnormal findings on diagnostic imaging of other specified body structures: Secondary | ICD-10-CM

## 2016-12-12 DIAGNOSIS — J449 Chronic obstructive pulmonary disease, unspecified: Secondary | ICD-10-CM

## 2016-12-12 DIAGNOSIS — F17210 Nicotine dependence, cigarettes, uncomplicated: Secondary | ICD-10-CM | POA: Diagnosis not present

## 2016-12-12 MED ORDER — PREDNISONE 10 MG PO TABS: ORAL_TABLET | ORAL | 5 refills | Status: DC

## 2016-12-12 NOTE — Progress Notes (Signed)
Subjective:   Patient ID: Gabrielle White, female    DOB: 1945/07/16 .   MRN: [id number]    Brief patient profile:  25   yowf active smoker with AB/GOLD 0 copd by pfts 03/08/15   History of Present Illness  01/22/2015 acute extended re-establish ov/Jaidin Ugarte re:  AB/ still smoking  Chief Complaint  Patient presents with  . Pulmonary Consult    pt last seen in 2014. pt states DR. Green wanted her to follow up. pt states somethines her throat feels like it closes up and she cant breath. pt states she feels like she has to take really deep breaths. pt c/o chest tightness, and prod cough white in color.pt c/o thrush she thiks may come from the inhailers.     Last better breathing  x 4 weeks prior to OV  p using prednisone but benefit only for a few days p stopped despite maint rx with symbicort (though hfa poor)  Nl routine is symbicort 160 2 bid and maybe ventolin and occ brovana no recent duoneb need  In retrospect really hasn't been able to really stay well x 4 y Cough is harsh and congested worse day than noct  rec Start Prednisone 10mg  Take 4 for two days three for two days two for two days one for two days  Plan A =  Automatic = symbicort 160 Take 2 puffs first thing in am and then another 2 puffs about 12 hours later.  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 Follow-up    Breathing has been worse this summer- relates to humidity. She has been coughing  some and wheezing. Cough is prod with clear to white sputum.  She is using proair 2-3 x per day.   Symptoms are worse when out in humidity x sev months / denies missing doses of symb but needing proair at least twice daily / also some noct and early am cough/ congestion  rec Plan A = Automatic = symbicort 160 Take 2 puffs first thing in am and then another 2 puffs about 12 hours later.  Work on inhaler technique:  relax and gently blow all the way out then take a nice smooth deep breath back in, triggering the inhaler at same time you start breathing in.  Hold for up to 5 seconds if you can. Blow out thru nose. Rinse and gargle with water when done Plan B = Backup Only use your albuterol as a rescue medication Plan C= Cortisone If having to use more albuterol than usual or much worse breathing > Prednisone 10 mg take  4 each am x 2 days,   2 each am x 2 days,  1 each am x 2 days and stop  The key is to stop smoking completely before smoking completely  stops you!       12/12/2016  f/u ov/Ridhima Golberg re:  Copd GOLD 0/ AB/ still smoking  On symb 160 2bid  Chief Complaint  Patient presents with  . Follow-up    CXR done today  doe = MMRC1 = can walk nl pace, flat grade, can't hurry or go uphills or steps s sob   No am excess mucus / did not activate plan c since last ov  saba avg 2-3 / day   No obvious day to day or daytime variability or assoc excess/ purulent sputum or mucus plugs or hemoptysis or cp or chest tightness, subjective wheeze or overt sinus or hb symptoms. No unusual exp hx or h/o childhood pna/ asthma or knowledge of premature birth.  Sleeping ok flat without nocturnal  or early am exacerbation  of respiratory  c/o's or need for noct saba. Also denies any obvious fluctuation of symptoms with weather or environmental changes or other aggravating or alleviating factors except as outlined above   Current Allergies, Complete Past Medical History, Past Surgical History, Family History, and  Social History were reviewed in [employer] record.  ROS  The following are not active complaints unless bolded sore throat, dysphagia, dental problems, itching, sneezing,  nasal congestion or disharge of excess mucus or purulent secretions, ear ache,   fever, chills, sweats, unintended wt loss or wt gain, classically pleuritic or exertional cp,  orthopnea pnd or leg swelling, presyncope, palpitations, abdominal pain, anorexia, nausea, vomiting, diarrhea  or change in bowel habits or bladder habits, change in stools or change in urine, dysuria, hematuria,  rash, arthralgias, visual complaints, headache, numbness, weakness or ataxia or problems with walking or coordination,  change in mood/affect or memory.        Current Meds  Medication Sig  . ALPRAZolam (XANAX) 0.5 MG tablet TAKE 1 TABLET BY MOUTH UP TO TWICE DAILY FOR NERVOUSNESS OR SLEEP  . atorvastatin (LIPITOR) 40 MG tablet TAKE 1 TABLET BY MOUTH DAILY FOR HIGH CHOLESTEROL  . budesonide-formoterol (SYMBICORT) 160-4.5 MCG/ACT inhaler Inhale 2 puffs by mouth first thing in the morning and 2 puffs about 12 hours later  . budesonide-formoterol (SYMBICORT) 160-4.5 MCG/ACT inhaler Inhale 2 puffs into the lungs 2 (two) times daily.  . Cholecalciferol (VITAMIN D3) 1000 UNITS CAPS Take 1 capsule by mouth daily.    Marland Kitchen levothyroxine (SYNTHROID, LEVOTHROID) 112 MCG tablet Take 1 tablet (112 mcg total) by mouth daily.  Marland Kitchen losartan (COZAAR) 50 MG tablet TAKE 1 TABLET BY MOUTH ONCE DAILY FOR BLOOD PRESSURE  . predniSONE (DELTASONE) 10 MG tablet Take  4 each am x 2 days,   2 each am x 2 days,  1 each am x 2 days and stop  . PROAIR HFA 108 (90 Base) MCG/ACT inhaler INHALE 1 TO 2 PUFFS INTO THE LUNGS EVERY 6 HOURS AS NEEDED FOR WHEEZING OR SHORTNESS OF BREATH  . PROAIR HFA 108 (90 Base) MCG/ACT inhaler INHALE 1 TO 2 PUFFS BY MOUTH INTO THE LUNGS EVERY 6 HOURS AS NEEDED FOR WHEEZING OR SHORTNESS OF BREATH  . sertraline (ZOLOFT) 100 MG  tablet TAKE 1 TABLET(100 MG) BY MOUTH DAILY  . [DISCONTINUED] predniSONE (DELTASONE) 10 MG tablet Take  4 each am x 2 days,   2 each am x 2 days,  1 each am x 2 days and stop                  Objective:   Physical Exam   GEN:  A/Ox3; pleasant , NAD, ambulatory well nourished     Vital signs reviewed - Note on arrival 02 sats  96% on  RA      01/22/2015      156 > 03/08/2015   162 > 06/07/2015  170 > 03/20/2016 160 > 05/15/2016   162 >   08/16/2016 163 > 11/20/2016  161 > 12/12/2016  162     01/16/13 156 lb (70.761 kg)  01/08/13 155 lb 3.2 oz (70.398 kg)  12/20/12 157 lb 9.6 oz (71.487 kg)       HEENT:  /AT,  EACs-clear, TMs-wnl, NOSE-clear, THROAT-clear, no lesions, no postnasal drip or exudate noted.   NECK:  Supple w/ fair ROM; no JVD; normal carotid impulses w/o bruits; no thyromegaly or nodules palpated; no lymphadenopathy.    RESP   Scattered mild   insp and exp sonorous rhonchi bilaterally  CARD:  RRR, no m/r/g  , no peripheral edema, pulses intact, no cyanosis or clubbing.  GI:   Soft & nt; nml bowel sounds; no organomegaly or masses detected.   Musco: Warm bil, no deformities or joint swelling noted.   Neuro: alert, no focal deficits noted.    Skin: Warm and dry , no rash     CXR PA and Lateral:   12/12/2016 :    I personally reviewed images and agree with radiology impression as follows:    No active cardiopulmonary disease.         Assessment & Plan:

## 2016-12-12 NOTE — Patient Instructions (Signed)
Plan A = Automatic = symbicort 160 Take 2 puffs first thing in am and then another 2 puffs about 12 hours later.   Plan B = Backup Only use your albuterol as a rescue medication to be used if you can't catch your breath by resting or doing a relaxed purse lip breathing pattern.  - The less you use it, the better it will work when you need it. - Ok to use the inhaler up to 2 puffs  every 4 hours if you must but call for appointment if use goes up over your usual need - Don't leave home without it !!  (think of it like the spare tire for your car   Plan C= Cortisone If having to use more albuterol than usual or much worse breathing > Prednisone 10 mg take  4 each am x 2 days,   2 each am x 2 days,  1 each am x 2 days and stop    The key is to stop smoking completely before smoking completely stops you!    Please schedule a follow up visit in 6  months but call sooner if needed

## 2016-12-13 ENCOUNTER — Telehealth: Payer: Self-pay | Admitting: Internal Medicine

## 2016-12-13 NOTE — Telephone Encounter (Signed)
Pt is aware of recommendations and voiced her understanding.   Notes recorded by Tanda Rockers, MD on 12/13/2016 at 9:19 AM EDT Call pt: Reviewed cxr and no acute change so no change in recommendations made at [facility]

## 2016-12-13 NOTE — Progress Notes (Signed)
LMTCB

## 2016-12-14 NOTE — Assessment & Plan Note (Signed)
-   PFTs  11/18/10  FEV1  2.0 (95%) ratio 73 and no change p saba and DLCO 75 corrects to 100% -PFT's  03/08/2015  FEV1 1.85 (78 % ) ratio 73  p 5 % improvement from saba with DLCO  63 % corrects to 72 % for alv volume and erv 17   - added flutter 06/07/2015  - PFT's  08/16/2016  FEV1 1.42 (61 % ) ratio 71  p 8 % improvement FEV1 (and 20% FVC) from saba p symb 160  prior to study with DLCO  61/58 % corrects to 78 % for alv volume    - Allergy profile 08/16/2016 >  Eos 0.5 /  IgE  99 RAST neg - prednisone x 6 days cycles 11/20/2016  - 11/20/2016  After extensive coaching HFA effectiveness =    90%  Did not understand concept of Plan C from last ov/ reviewed > no change in plan A = symb 160 2bid   I had an extended discussion with the patient reviewing all relevant studies completed to date and  lasting 15 to 20 minutes of a 25 minute visit    Each maintenance medication was reviewed in detail including most importantly the difference between maintenance and prns and under what circumstances the prns are to be triggered using an action plan format that is not reflected in the computer generated alphabetically organized AVS.    Please see AVS for specific instructions unique to this visit that I personally wrote and verbalized to the the pt in detail and then reviewed with pt  by my nurse highlighting any  changes in therapy recommended at today's visit to their plan of care.

## 2016-12-14 NOTE — Assessment & Plan Note (Signed)
See cxr 05/16/16   ? SSA RLL > clear on 12/12/2016 > no directed f/u needed at this point

## 2016-12-14 NOTE — Assessment & Plan Note (Signed)
>  3 m Still quite fatalistic re smoking > doesn't seem motivated at all even with enlisting issue of how much fm would miss her if developed complications from smoking > Follow up per Primary Care planned

## 2016-12-25 ENCOUNTER — Ambulatory Visit: Payer: Medicare Other | Admitting: Internal Medicine

## 2017-01-03 ENCOUNTER — Other Ambulatory Visit: Payer: Self-pay | Admitting: Internal Medicine

## 2017-01-10 ENCOUNTER — Telehealth: Payer: Self-pay | Admitting: *Deleted

## 2017-01-10 NOTE — Telephone Encounter (Signed)
Received fax from [employer] stating Prior Authorization is not required at this time. Stated medication is on plan's list of covered drugs.  Patient ID: [id number] Ref#: [id number]

## 2017-01-10 NOTE — Telephone Encounter (Signed)
Received Prior authorization from [facility] [phone] for Symbicort. Filled out form and placed in folder for Janett Billow to review. To be faxed back to [facility] Fax:[phone]

## 2017-01-26 ENCOUNTER — Other Ambulatory Visit: Payer: Self-pay | Admitting: Internal Medicine

## 2017-02-06 ENCOUNTER — Other Ambulatory Visit: Payer: Self-pay | Admitting: Internal Medicine

## 2017-02-21 ENCOUNTER — Ambulatory Visit: Payer: Medicare Other | Admitting: Nurse Practitioner

## 2017-02-24 ENCOUNTER — Other Ambulatory Visit: Payer: Self-pay | Admitting: Internal Medicine

## 2017-03-07 ENCOUNTER — Encounter: Payer: Self-pay | Admitting: Nurse Practitioner

## 2017-03-07 ENCOUNTER — Other Ambulatory Visit: Payer: Self-pay | Admitting: Nurse Practitioner

## 2017-03-07 ENCOUNTER — Telehealth: Payer: Self-pay

## 2017-03-07 DIAGNOSIS — E7849 Other hyperlipidemia: Secondary | ICD-10-CM

## 2017-03-07 DIAGNOSIS — I10 Essential (primary) hypertension: Secondary | ICD-10-CM

## 2017-03-07 DIAGNOSIS — E039 Hypothyroidism, unspecified: Secondary | ICD-10-CM

## 2017-03-07 DIAGNOSIS — Z1231 Encounter for screening mammogram for malignant neoplasm of breast: Secondary | ICD-10-CM

## 2017-03-07 DIAGNOSIS — E038 Other specified hypothyroidism: Secondary | ICD-10-CM

## 2017-03-07 NOTE — Telephone Encounter (Signed)
done

## 2017-03-07 NOTE — Telephone Encounter (Signed)
Pt call to request a 6 month visit. She requested that labs be done before office visit.   Please advise on which labs patient may need.    Fasting lab scheduled 03/19/17 6 month OV scheduled 03/22/17

## 2017-03-07 NOTE — Progress Notes (Signed)
This encounter was created in error - please disregard.

## 2017-03-08 ENCOUNTER — Other Ambulatory Visit: Payer: Self-pay | Admitting: Nurse Practitioner

## 2017-03-12 ENCOUNTER — Ambulatory Visit: Payer: Self-pay | Admitting: Nurse Practitioner

## 2017-03-12 ENCOUNTER — Other Ambulatory Visit: Payer: Self-pay

## 2017-03-14 ENCOUNTER — Ambulatory Visit: Payer: Self-pay | Admitting: Nurse Practitioner

## 2017-03-19 ENCOUNTER — Other Ambulatory Visit: Payer: Medicare Other

## 2017-03-19 ENCOUNTER — Ambulatory Visit (INDEPENDENT_AMBULATORY_CARE_PROVIDER_SITE_OTHER): Payer: Medicare Other

## 2017-03-19 VITALS — BP 124/78 | HR 95 | Temp 98.4°F | Ht 65.0 in | Wt 164.0 lb

## 2017-03-19 DIAGNOSIS — E7849 Other hyperlipidemia: Secondary | ICD-10-CM

## 2017-03-19 DIAGNOSIS — I10 Essential (primary) hypertension: Secondary | ICD-10-CM

## 2017-03-19 DIAGNOSIS — Z0000 Encounter for general adult medical examination without abnormal findings: Secondary | ICD-10-CM

## 2017-03-19 DIAGNOSIS — E039 Hypothyroidism, unspecified: Secondary | ICD-10-CM

## 2017-03-19 LAB — CBC WITH DIFFERENTIAL/PLATELET
BASOS PCT: 0.6 %
Basophils Absolute: 75 cells/uL (ref 0–200)
EOS ABS: 388 {cells}/uL (ref 15–500)
EOS PCT: 3.1 %
HCT: 48.4 % — ABNORMAL HIGH (ref 35.0–45.0)
Hemoglobin: 16.1 g/dL — ABNORMAL HIGH (ref 11.7–15.5)
Lymphs Abs: 3750 cells/uL (ref 850–3900)
MCH: 30.3 pg (ref 27.0–33.0)
MCHC: 33.3 g/dL (ref 32.0–36.0)
MCV: 91 fL (ref 80.0–100.0)
MONOS PCT: 7.5 %
MPV: 10.7 fL (ref 7.5–12.5)
NEUTROS ABS: 7350 {cells}/uL (ref 1500–7800)
Neutrophils Relative %: 58.8 %
PLATELETS: 300 10*3/uL (ref 140–400)
RBC: 5.32 10*6/uL — ABNORMAL HIGH (ref 3.80–5.10)
RDW: 12.7 % (ref 11.0–15.0)
TOTAL LYMPHOCYTE: 30 %
WBC mixed population: 938 cells/uL (ref 200–950)
WBC: 12.5 10*3/uL — AB (ref 3.8–10.8)

## 2017-03-19 LAB — COMPLETE METABOLIC PANEL WITH GFR
AG Ratio: 1.6 (calc) (ref 1.0–2.5)
ALKALINE PHOSPHATASE (APISO): 97 U/L (ref 33–130)
ALT: 17 U/L (ref 6–29)
AST: 16 U/L (ref 10–35)
Albumin: 4 g/dL (ref 3.6–5.1)
BILIRUBIN TOTAL: 0.6 mg/dL (ref 0.2–1.2)
BUN: 17 mg/dL (ref 7–25)
CHLORIDE: 100 mmol/L (ref 98–110)
CO2: 32 mmol/L (ref 20–32)
CREATININE: 0.84 mg/dL (ref 0.60–0.93)
Calcium: 9.3 mg/dL (ref 8.6–10.4)
GFR, Est African American: 80 mL/min/{1.73_m2} (ref >=60)
GFR, Est Non African American: 69 mL/min/{1.73_m2} (ref >=60)
GLUCOSE: 94 mg/dL (ref 65–99)
Globulin: 2.5 g/dL (calc) (ref 1.9–3.7)
Potassium: 4.5 mmol/L (ref 3.5–5.3)
Sodium: 138 mmol/L (ref 135–146)
TOTAL PROTEIN: 6.5 g/dL (ref 6.1–8.1)

## 2017-03-19 LAB — LIPID PANEL
CHOL/HDL RATIO: 2.3 (calc) (ref <5.0)
CHOLESTEROL: 176 mg/dL (ref <200)
HDL: 75 mg/dL (ref >50)
LDL CHOLESTEROL (CALC): 74 mg/dL
NON-HDL CHOLESTEROL (CALC): 101 mg/dL (ref <130)
Triglycerides: 172 mg/dL — ABNORMAL HIGH (ref <150)

## 2017-03-19 LAB — TSH: TSH: 4.38 m[IU]/L (ref 0.40–4.50)

## 2017-03-19 MED ORDER — TETANUS-DIPHTH-ACELL PERTUSSIS 5-2.5-18.5 LF-MCG/0.5 IM SUSP: 0.5000 mL | Freq: Once | INTRAMUSCULAR | 0 refills | Status: AC

## 2017-03-19 MED ORDER — ZOSTER VAC RECOMB ADJUVANTED 50 MCG/0.5ML IM SUSR: 0.5000 mL | Freq: Once | INTRAMUSCULAR | 1 refills | Status: AC

## 2017-03-19 NOTE — Patient Instructions (Signed)
Gabrielle White , Thank you for taking time to come for your Medicare Wellness Visit. I appreciate your ongoing commitment to your health goals. Please review the following plan we discussed and let me know if I can assist you in the future.   Screening recommendations/referrals: Colonoscopy up to date. Due 04/07/2019 Mammogram up to date. Due 04/12/2018 Bone Density up to date Recommended yearly ophthalmology/optometry visit for glaucoma screening and checkup Recommended yearly dental visit for hygiene and checkup  Vaccinations: Influenza vaccine up to date. Due 2019 fall season Pneumococcal vaccine up to date Tdap vaccine due, prescription sent to pharmacy Shingles vaccine due, prescription sent to pharmacy    Advanced directives: Please bring [place] a copy of your living will and health care power of attorney  Conditions/risks identified: None  Next appointment: Sherrie Mustache, NP 03/22/2017 @ 2:45pm   Preventive Care 72 Years and Older, Female Preventive care refers to lifestyle choices and visits with your health care provider that can promote health and wellness. What does preventive care include?  A yearly physical exam. This is also called an annual well check.  Dental exams once or twice a year.  Routine eye exams. Ask your health care provider how often you should have your eyes checked.  Personal lifestyle choices, including:  Daily care of your teeth and gums.  Regular physical activity.  Eating a healthy diet.  Avoiding tobacco and drug use.  Limiting alcohol use.  Practicing safe sex.  Taking low-dose aspirin every day.  Taking vitamin and mineral supplements as recommended by your health care provider. What happens during an annual well check? The services and screenings done by your health care provider during your annual well check will depend on your age, overall health, lifestyle risk factors, and family history of disease. Counseling  Your health care  provider may ask you questions about your:  Alcohol use.  Tobacco use.  Drug use.  Emotional well-being.  Home and relationship well-being.  Sexual activity.  Eating habits.  History of falls.  Memory and ability to understand (cognition).  Work and work Statistician.  Reproductive health. Screening  You may have the following tests or measurements:  Height, weight, and BMI.  Blood pressure.  Lipid and cholesterol levels. These may be checked every 5 years, or more frequently if you are over 82 years old.  Skin check.  Lung cancer screening. You may have this screening every year starting at age 62 if you have a 30-pack-year history of smoking and currently smoke or have quit within the past 15 years.  Fecal occult blood test (FOBT) of the stool. You may have this test every year starting at age 11.  Flexible sigmoidoscopy or colonoscopy. You may have a sigmoidoscopy every 5 years or a colonoscopy every 10 years starting at age 95.  Hepatitis C blood test.  Hepatitis B blood test.  Sexually transmitted disease (STD) testing.  Diabetes screening. This is done by checking your blood sugar (glucose) after you have not eaten for a while (fasting). You may have this done every 1-3 years.  Bone density scan. This is done to screen for osteoporosis. You may have this done starting at age 57.  Mammogram. This may be done every 1-2 years. Talk to your health care provider about how often you should have regular mammograms. Talk with your health care provider about your test results, treatment options, and if necessary, the need for more tests. Vaccines  Your health care provider may recommend certain vaccines,  such as:  Influenza vaccine. This is recommended every year.  Tetanus, diphtheria, and acellular pertussis (Tdap, Td) vaccine. You may need a Td booster every 10 years.  Zoster vaccine. You may need this after age 38.  Pneumococcal 13-valent conjugate (PCV13)  vaccine. One dose is recommended after age 29.  Pneumococcal polysaccharide (PPSV23) vaccine. One dose is recommended after age 37. Talk to your health care provider about which screenings and vaccines you need and how often you need them. This information is not intended to replace advice given to you by your health care provider. Make sure you discuss any questions you have with your health care provider. Document Released: 04/16/2015 Document Revised: 12/08/2015 Document Reviewed: 01/19/2015 Elsevier Interactive Patient Education  2017 [place] Prevention in the Home Falls can cause injuries. They can happen to people of all ages. There are many things you can do to make your home safe and to help prevent falls. What can I do on the outside of my home?  Regularly fix the edges of walkways and driveways and fix any cracks.  Remove anything that might make you trip as you walk through a door, such as a raised step or threshold.  Trim any bushes or trees on the path to your home.  Use bright outdoor lighting.  Clear any walking paths of anything that might make someone trip, such as rocks or tools.  Regularly check to see if handrails are loose or broken. Make sure that both sides of any steps have handrails.  Any raised decks and porches should have guardrails on the edges.  Have any leaves, snow, or ice cleared regularly.  Use sand or salt on walking paths during winter.  Clean up any spills in your garage right away. This includes oil or grease spills. What can I do in the bathroom?  Use night lights.  Install grab bars by the toilet and in the tub and shower. Do not use towel bars as grab bars.  Use non-skid mats or decals in the tub or shower.  If you need to sit down in the shower, use a plastic, non-slip stool.  Keep the floor dry. Clean up any water that spills on the floor as soon as it happens.  Remove soap buildup in the tub or shower  regularly.  Attach bath mats securely with double-sided non-slip rug tape.  Do not have throw rugs and other things on the floor that can make you trip. What can I do in the bedroom?  Use night lights.  Make sure that you have a light by your bed that is easy to reach.  Do not use any sheets or blankets that are too big for your bed. They should not hang down onto the floor.  Have a firm chair that has side arms. You can use this for support while you get dressed.  Do not have throw rugs and other things on the floor that can make you trip. What can I do in the kitchen?  Clean up any spills right away.  Avoid walking on wet floors.  Keep items that you use a lot in easy-to-reach places.  If you need to reach something above you, use a strong step stool that has a grab bar.  Keep electrical cords out of the way.  Do not use floor polish or wax that makes floors slippery. If you must use wax, use non-skid floor wax.  Do not have throw rugs and other things on  the floor that can make you trip. What can I do with my stairs?  Do not leave any items on the stairs.  Make sure that there are handrails on both sides of the stairs and use them. Fix handrails that are broken or loose. Make sure that handrails are as long as the stairways.  Check any carpeting to make sure that it is firmly attached to the stairs. Fix any carpet that is loose or worn.  Avoid having throw rugs at the top or bottom of the stairs. If you do have throw rugs, attach them to the floor with carpet tape.  Make sure that you have a light switch at the top of the stairs and the bottom of the stairs. If you do not have them, ask someone to add them for you. What else can I do to help prevent falls?  Wear shoes that:  Do not have high heels.  Have rubber bottoms.  Are comfortable and fit you well.  Are closed at the toe. Do not wear sandals.  If you use a stepladder:  Make sure that it is fully  opened. Do not climb a closed stepladder.  Make sure that both sides of the stepladder are locked into place.  Ask someone to hold it for you, if possible.  Clearly mark and make sure that you can see:  Any grab bars or handrails.  First and last steps.  Where the edge of each step is.  Use tools that help you move around (mobility aids) if they are needed. These include:  Canes.  Walkers.  Scooters.  Crutches.  Turn on the lights when you go into a dark area. Replace any light bulbs as soon as they burn out.  Set up your furniture so you have a clear path. Avoid moving your furniture around.  If any of your floors are uneven, fix them.  If there are any pets around you, be aware of where they are.  Review your medicines with your doctor. Some medicines can make you feel dizzy. This can increase your chance of falling. Ask your doctor what other things that you can do to help prevent falls. This information is not intended to replace advice given to you by your health care provider. Make sure you discuss any questions you have with your health care provider. Document Released: 01/14/2009 Document Revised: 08/26/2015 Document Reviewed: 04/24/2014 Elsevier Interactive Patient Education  2017 [employer].

## 2017-03-19 NOTE — Progress Notes (Signed)
Subjective:   Gabrielle White is a 71 y.o. female who presents for an Initial Medicare Annual Wellness Visit.        Objective:    Today's Vitals   03/19/17 1102  BP: 124/78  Pulse: 95  Temp: 98.4 F (36.9 C)  TempSrc: Oral  SpO2: 92%  Weight: 164 lb (74.4 kg)  Height: 5\' 5"  (1.651 m)   Body mass index is 27.29 kg/m.  Advanced Directives 03/19/2017 08/22/2016 08/02/2016 07/11/2016 07/04/2016 05/23/2016 03/06/2016  Does Patient Have a Medical Advance Directive? Yes Yes Yes Yes Yes Yes Yes  Type of Paramedic of [place];Living will [place];Living will [place];Living will [place];Living will [place];Living will [place];Living will [place];Living will  Does patient want to make changes to medical advance directive? No - Patient declined - - - - - -  Copy of [place] in Chart? No - copy requested Yes No - copy requested Yes Yes No - copy requested No - copy requested  Would patient like information on creating a medical advance directive? - - - - - - -    Current Medications (verified) Outpatient Encounter Medications as of 03/19/2017  Medication Sig  . ALPRAZolam (XANAX) 0.5 MG tablet TAKE 1 TABLET BY MOUTH UP TO TWICE DAILY FOR NERVOUSNESS OR SLEEP  . atorvastatin (LIPITOR) 40 MG tablet TAKE 1 TABLET BY MOUTH DAILY FOR HIGH CHOLESTEROL  . budesonide-formoterol (SYMBICORT) 160-4.5 MCG/ACT inhaler Inhale 2 puffs by mouth first thing in the morning and 2 puffs about 12 hours later  . budesonide-formoterol (SYMBICORT) 160-4.5 MCG/ACT inhaler Inhale 2 puffs into the lungs 2 (two) times daily.  . Cholecalciferol (VITAMIN D3) 1000 UNITS CAPS Take 1 capsule by mouth daily.    Marland Kitchen levothyroxine (SYNTHROID, LEVOTHROID) 112 MCG tablet Take 1 tablet (112 mcg total) by mouth daily.  Marland Kitchen losartan (COZAAR) 50 MG tablet TAKE 1 TABLET BY  MOUTH ONCE DAILY FOR BLOOD PRESSURE  . predniSONE (DELTASONE) 10 MG tablet Take  4 each am x 2 days,   2 each am x 2 days,  1 each am x 2 days and stop  . PROAIR HFA 108 (90 Base) MCG/ACT inhaler INHALE 1 TO 2 PUFFS INTO THE LUNGS EVERY 6 HOURS AS NEEDED FOR WHEEZING OR SHORTNESS OF BREATH  . PROAIR HFA 108 (90 Base) MCG/ACT inhaler INHALE 1-2 PUFFS INTO THE LUNGS EVERY 6 HOURS AS NEEDED WHEEZING OR SHORTNESS OF BREATH  . sertraline (ZOLOFT) 100 MG tablet TAKE 1 TABLET BY MOUTH DAILY  . Tdap (BOOSTRIX) 5-2.5-18.5 LF-MCG/0.5 injection Inject 0.5 mLs into the muscle once for 1 dose.  Marland Kitchen Zoster Vaccine Adjuvanted [facility]) injection Inject 0.5 mLs into the muscle once for 1 dose.  . [DISCONTINUED] Tdap (BOOSTRIX) 5-2.5-18.5 LF-MCG/0.5 injection Inject 0.5 mLs into the muscle once.  . [DISCONTINUED] Zoster Vaccine Adjuvanted [facility]) injection Inject 0.5 mLs into the muscle once.   No facility-administered encounter medications on file as of 03/19/2017.     Allergies (verified) Cafergot; Codeine; Fenoprofen calcium; and Nalfon [fenoprofen]   History: Past Medical History:  Diagnosis Date  . Chronic airway obstruction, not elsewhere classified   . Depressive disorder   . Depressive disorder, not elsewhere classified   . Emphysema   . Enthesopathy of ankle and tarsus, unspecified   . HTN (hypertension)   . Hyperlipidemia   . Hypertension   . Hypopotassemia   .  Leukocytosis   . Migraine, unspecified, without mention of intractable migraine without mention of status migrainosus   . Nonspecific (abnormal) findings on radiological and other examination of skull and head   . Osteoarthrosis, unspecified whether generalized or localized, unspecified site   . Other emphysema ([place])   . Other specified disease of white blood cells   . Palpitations   . Tobacco abuse    Past Surgical History:  Procedure Laterality Date  . CESAREAN SECTION    . COLONOSCOPY  08/14/2008   Dr.. Delfin Edis    Family History  Problem Relation Age of Onset  . Alzheimer's disease Father   . Heart disease Father   . Diabetes Father   . Skin cancer Father   . Heart disease Mother   . Breast cancer Mother    Social History   Socioeconomic History  . Marital status: Divorced    Spouse name: None  . Number of children: 1  . Years of education: BA  . Highest education level: None  Social Needs  . Financial resource strain: Not hard at all  . Food insecurity - worry: Never true  . Food insecurity - inability: Never true  . Transportation needs - medical: No  . Transportation needs - non-medical: No  Occupational History  . Occupation: retired Pharmacist, hospital  . Occupation: Physicist, medical  Tobacco Use  . Smoking status: Current Every Day Smoker    Packs/day: 1.00    Years: 28.00    Pack years: 28.00    Types: Cigarettes  . Smokeless tobacco: Never Used  Substance and Sexual Activity  . Alcohol use: No    Alcohol/week: 0.0 oz  . Drug use: No  . Sexual activity: No  Other Topics Concern  . None  Social History Narrative   Drinks 2-3 cups of coffee a day     Tobacco Counseling Ready to quit: Not Answered Counseling given: Not Answered   Clinical Intake:  Pre-visit preparation completed: No  Pain : No/denies pain     Nutritional Risks: None Diabetes: No  How often do you need to have someone help you when you read instructions, pamphlets, or other written materials from your doctor or pharmacy?: 1 - Never What is the last grade level you completed in school?: Some College  Interpreter Needed?: No  Information entered by :: Rich Reining, RN   Activities of Daily Living In your present state of health, do you have any difficulty performing the following activities: 03/19/2017  Hearing? Y  Vision? N  Difficulty concentrating or making decisions? Y  Walking or climbing stairs? N  Dressing or bathing? N  Doing errands, shopping? N  Preparing Food and eating ? N  Using  the Toilet? N  In the past six months, have you accidently leaked urine? Y  Do you have problems with loss of bowel control? N  Managing your Medications? N  Managing your Finances? N  Housekeeping or managing your Housekeeping? N  Some recent data might be hidden     Immunizations and Health Maintenance Immunization History  Administered Date(s) Administered  . DTaP 04/04/2004  . Influenza Split 01/01/2013  . Influenza,inj,Quad PF,6+ Mos 01/08/2013, 12/17/2013, 03/10/2015, 03/06/2016  . Influenza-Unspecified 02/20/2017  . Pneumococcal Conjugate-13 10/06/2015  . Pneumococcal Polysaccharide-23 04/11/2011  . Td 04/04/2004  . Zoster 01/12/2014   Health Maintenance Due  Topic Date Due  . Hepatitis C Screening  April 15, 1945  . TETANUS/TDAP  04/04/2014    Patient Care Team: Lauree Chandler,  NP as PCP - General (Geriatric Medicine)  Indicate any recent Medical Services you may have received from other than [facility] providers in the past year (date may be approximate).     Assessment:   This is a routine wellness examination for [place].  Hearing/Vision screen Hearing Screening Comments: Pt reports some issues with hearing Vision Screening Comments: Sees Dr. Schuyler Amor annually  Dietary issues and exercise activities discussed: Current Exercise Habits: Home exercise routine, Type of exercise: walking, Time (Minutes): 20, Frequency (Times/Week): 3, Weekly Exercise (Minutes/Week): 60, Intensity: Mild, Exercise limited by: None identified  Goals    . Quit Smoking     Patient will try to quit smoking      Depression Screen PHQ 2/9 Scores 03/19/2017 10/28/2014 02/17/2014 12/17/2013 10/22/2012  PHQ - 2 Score 0 0 0 0 0    Fall Risk Fall Risk  03/19/2017 08/22/2016 07/11/2016 07/04/2016 05/23/2016  Falls in the past year? No No No No No  Number falls in past yr: - - - - -  Injury with Fall? - - - - -  Comment - - - - -    Is the patient's home free of loose throw rugs in walkways, pet  beds, electrical cords, etc?   yes      Grab bars in the bathroom? yes      Handrails on the stairs?   yes      Adequate lighting?   yes  Timed Get Up and Go Performed 15 seconds, fall risk  Cognitive Function: MMSE - Mini Mental State Exam 03/19/2017  Orientation to time 5  Orientation to Place 5  Registration 3  Attention/ Calculation 5  Recall 2  Language- name 2 objects 2  Language- repeat 1  Language- follow 3 step command 3  Language- read & follow direction 1  Write a sentence 1  Copy design 1  Total score 29        Screening Tests Health Maintenance  Topic Date Due  . Hepatitis C Screening  08-30-1945  . TETANUS/TDAP  04/04/2014  . MAMMOGRAM  04/12/2018  . COLONOSCOPY  04/07/2019  . INFLUENZA VACCINE  Completed  . DEXA SCAN  Completed  . PNA vac Low Risk Adult  Completed    Qualifies for Shingles Vaccine? Yes, educated and prescription sent to pharmacy  Cancer Screenings: Lung: Low Dose CT Chest recommended if Age 53-80 years, 30 pack-year currently smoking OR have quit w/in 15years. Patient does qualify. Breast: Up to date on Mammogram? Yes   Up to date of Bone Density/Dexa? Yes Colorectal: up to date  Additional Screenings:  Hepatitis B/HIV/Syphillis:Not indicated Hepatitis C Screening: Due, declined     Plan:    I have personally reviewed and addressed the Medicare Annual Wellness questionnaire and have noted the following in the patient's chart:  A. Medical and social history B. Use of alcohol, tobacco or illicit drugs  C. Current medications and supplements D. Functional ability and status E.  Nutritional status F.  Physical activity G. Advance directives H. List of other physicians I.  Hospitalizations, surgeries, and ER visits in previous 12 months J.  [place] to include hearing, vision, cognitive, depression L. Referrals and appointments - none  In addition, I have reviewed and discussed with patient certain preventive  protocols, quality metrics, and best practice recommendations. A written personalized care plan for preventive services as well as general preventive health recommendations were provided to patient.  See attached scanned questionnaire for additional information.  Signed,   Rich Reining, RN Nurse Health Advisor   Quick Notes   Health Maintenance: Hep C screen declined. tdap and shingrix prescriptions sent to pharmacy.      Abnormal Screen: MMSE 29/30. Passed clock drawing. Hearing screen results.     Patient Concerns: numbness and tingling in feet for 6 months, congestion at night intermittently     Nurse Concerns: None

## 2017-03-21 ENCOUNTER — Other Ambulatory Visit: Payer: Self-pay | Admitting: Internal Medicine

## 2017-03-21 DIAGNOSIS — I10 Essential (primary) hypertension: Secondary | ICD-10-CM

## 2017-03-22 ENCOUNTER — Encounter: Payer: Self-pay | Admitting: Nurse Practitioner

## 2017-03-22 ENCOUNTER — Ambulatory Visit (INDEPENDENT_AMBULATORY_CARE_PROVIDER_SITE_OTHER): Payer: Medicare Other | Admitting: Nurse Practitioner

## 2017-03-22 VITALS — BP 122/78 | HR 88 | Temp 98.2°F | Resp 17 | Ht 65.0 in | Wt 163.0 lb

## 2017-03-22 DIAGNOSIS — M150 Primary generalized (osteo)arthritis: Secondary | ICD-10-CM | POA: Diagnosis not present

## 2017-03-22 DIAGNOSIS — E039 Hypothyroidism, unspecified: Secondary | ICD-10-CM | POA: Diagnosis not present

## 2017-03-22 DIAGNOSIS — I10 Essential (primary) hypertension: Secondary | ICD-10-CM

## 2017-03-22 DIAGNOSIS — E7849 Other hyperlipidemia: Secondary | ICD-10-CM | POA: Diagnosis not present

## 2017-03-22 DIAGNOSIS — F331 Major depressive disorder, recurrent, moderate: Secondary | ICD-10-CM

## 2017-03-22 DIAGNOSIS — J449 Chronic obstructive pulmonary disease, unspecified: Secondary | ICD-10-CM

## 2017-03-22 DIAGNOSIS — N3281 Overactive bladder: Secondary | ICD-10-CM

## 2017-03-22 DIAGNOSIS — M159 Polyosteoarthritis, unspecified: Secondary | ICD-10-CM

## 2017-03-22 MED ORDER — BUDESONIDE-FORMOTEROL FUMARATE 160-4.5 MCG/ACT IN AERO: INHALATION_SPRAY | RESPIRATORY_TRACT | 5 refills | Status: DC

## 2017-03-22 MED ORDER — ALBUTEROL SULFATE HFA 108 (90 BASE) MCG/ACT IN AERS: INHALATION_SPRAY | RESPIRATORY_TRACT | 2 refills | Status: DC

## 2017-03-22 MED ORDER — DOXYCYCLINE HYCLATE 100 MG PO TABS: 100.0000 mg | ORAL_TABLET | Freq: Two times a day (BID) | ORAL | 0 refills | Status: DC

## 2017-03-22 MED ORDER — LOSARTAN POTASSIUM 50 MG PO TABS: ORAL_TABLET | ORAL | 1 refills | Status: DC

## 2017-03-22 MED ORDER — BUPROPION HCL ER (XL) 150 MG PO TB24: 150.0000 mg | ORAL_TABLET | Freq: Every day | ORAL | 1 refills | Status: DC

## 2017-03-22 MED ORDER — MIRABEGRON ER 25 MG PO TB24: 25.0000 mg | ORAL_TABLET | Freq: Every day | ORAL | 2 refills | Status: DC

## 2017-03-22 MED ORDER — TETANUS-DIPHTH-ACELL PERTUSSIS 5-2.5-18.5 LF-MCG/0.5 IM SUSP: 0.5000 mL | Freq: Once | INTRAMUSCULAR | 0 refills | Status: AC

## 2017-03-22 NOTE — Patient Instructions (Addendum)
Start Wellbutrin by mouth daily for mood  After 2 weeks start myrbetriq 25 mg by mouth daily   Use aspercream with lidocaine for toe pain, also can use on back and ribs if needed  Tylenol 650 mg by mouth every 6 hours beneficial as well Increase activity and exercise  Follow up in 4 weeks for mood

## 2017-03-22 NOTE — Progress Notes (Signed)
Careteam: Patient Care Team: Lauree Chandler, NP as PCP - General (Geriatric Medicine)  Advanced Directive information    Allergies  Allergen Reactions  . Cafergot   . Codeine   . Fenoprofen Calcium   . Nalfon [Fenoprofen]     Chief Complaint  Patient presents with  . Medical Management of Chronic Issues    Pt is being seen for a 6 month routine visit. Pt reports that she has a cold that feels like it is trying to settle in her chest.   . Medication Refill    multiple refills pended  . Immunizations    Rx for Tdap      HPI: Patient is a 72 y.o. female seen in the office today for routine follow up.   Hyperlipidemia- went on a "sweet crave" and drinking a lot of gingerale. No exercise. Taking lipitor 40 mg daily   Hypothyroid- TSH at goal 4.28, conts on synthroid 112 mcg   Reports she is always tired. Had a cold, sore throat for a few days, now feels like it is in her chest. Cough is deeper. Coughing up thick sputum. Has not taken anything. No fever, no increase in shortness of breath.  Still smoking- 10 cigarettes a day down from 20 Pt with increase WBC, had prednisone last week after breathing got worse when it snowed. Breathing better but still does not feel great.  COPD- taking Symbicort twice daily   Depression- taking zoloft, never really happy. Her best friend in the whole world died a few years ago and she has not been the same since. Thinks for her every day. Friends since high school and just has been devastated. Happened unexpectedly. Has not talked to anyone about this, does not want to do therapy does not feel like she is that bad off, no SI or HI. Lacks motivation.  Had to give up her horses due to her breathing which was her world Does not like to leave the house.  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    Overactive bladder- gotten very bad. Had to use pad due to incontinence from urgency.   Pain in her toes at night and sometimes in her ribs  after she has coughed a lot.  Will get back pain when she's up a lot doing the day.  Review of Systems:  Review of Systems  Constitutional: Negative for chills, fever and weight loss.  HENT: Positive for sore throat. Negative for tinnitus.   Respiratory: Positive for cough and sputum production. Negative for shortness of breath.   Cardiovascular: Negative for chest pain, palpitations and leg swelling.  Gastrointestinal: Negative for abdominal pain, constipation, diarrhea and heartburn.  Genitourinary: Negative for dysuria, frequency and urgency.  Musculoskeletal: Positive for back pain (back ache occasionally) and joint pain. Negative for falls and myalgias.  Skin: Negative.   Neurological: Negative for dizziness and headaches.  Psychiatric/Behavioral: Positive for depression. Negative for memory loss. The patient does not have insomnia.    Past Medical History:  Diagnosis Date  . Chronic airway obstruction, not elsewhere classified   . Depressive disorder   . Depressive disorder, not elsewhere classified   . Emphysema   . Enthesopathy of ankle and tarsus, unspecified   . HTN (hypertension)   . Hyperlipidemia   . Hypertension   . Hypopotassemia   . Leukocytosis   . Migraine, unspecified, without mention of intractable migraine without mention of status migrainosus   . Nonspecific (abnormal) findings on radiological  and other examination of skull and head   . Osteoarthrosis, unspecified whether generalized or localized, unspecified site   . Other emphysema ([place])   . Other specified disease of white blood cells   . Palpitations   . Tobacco abuse    Past Surgical History:  Procedure Laterality Date  . CESAREAN SECTION    . COLONOSCOPY  08/14/2008   Dr.. Delfin Edis   Social History:   reports that she has been smoking cigarettes.  She has a 28.00 pack-year smoking history. she has never used smokeless tobacco. She reports that she does not drink alcohol or use drugs.  Family  History  Problem Relation Age of Onset  . Alzheimer's disease Father   . Heart disease Father   . Diabetes Father   . Skin cancer Father   . Heart disease Mother   . Breast cancer Mother     Medications:   Medication List        Accurate as of 03/22/17  2:57 PM. Always use your most recent med list.          ALPRAZolam 0.5 MG tablet Commonly known as:  XANAX TAKE 1 TABLET BY MOUTH UP TO TWICE DAILY FOR NERVOUSNESS OR SLEEP   atorvastatin 40 MG tablet Commonly known as:  LIPITOR TAKE 1 TABLET BY MOUTH DAILY FOR HIGH CHOLESTEROL   budesonide-formoterol 160-4.5 MCG/ACT inhaler Commonly known as:  SYMBICORT Inhale 2 puffs by mouth first thing in the morning and 2 puffs about 12 hours later   levothyroxine 112 MCG tablet Commonly known as:  SYNTHROID, LEVOTHROID Take 1 tablet (112 mcg total) by mouth daily.   losartan 50 MG tablet Commonly known as:  COZAAR TAKE 1 TABLET BY MOUTH ONCE DAILY FOR BLOOD PRESSURE   predniSONE 10 MG tablet Commonly known as:  DELTASONE Take  4 each am x 2 days,   2 each am x 2 days,  1 each am x 2 days and stop   PROAIR HFA 108 (90 Base) MCG/ACT inhaler Generic drug:  albuterol INHALE 1 TO 2 PUFFS INTO THE LUNGS EVERY 6 HOURS AS NEEDED FOR WHEEZING OR SHORTNESS OF BREATH   sertraline 100 MG tablet Commonly known as:  ZOLOFT TAKE 1 TABLET BY MOUTH DAILY   Tdap 5-2.5-18.5 LF-MCG/0.5 injection Commonly known as:  BOOSTRIX   Vitamin D3 1000 units Caps        Physical Exam:  Vitals:   03/22/17 1454  BP: 122/78  Pulse: 88  Resp: 17  Temp: 98.2 F (36.8 C)  TempSrc: Oral  SpO2: 96%  Weight: 163 lb (73.9 kg)  Height: 5\' 5"  (1.651 m)   Body mass index is 27.12 kg/m.  Physical Exam  Constitutional: She is oriented to person, place, and time. She appears well-developed and well-nourished. No distress.  HENT:  Head: Normocephalic and atraumatic.  Right Ear: External ear normal.  Left Ear: External ear normal.  Nose: Nose  normal.  Mouth/Throat: Posterior oropharyngeal erythema present. No oropharyngeal exudate or posterior oropharyngeal edema.  Eyes: Conjunctivae and EOM are normal. Pupils are equal, round, and reactive to light. Left eye exhibits no discharge.  Corrective lenses.  Neck: Normal range of motion. Neck supple.  Cardiovascular: Normal rate, regular rhythm and normal heart sounds. Exam reveals no gallop and no friction rub.  No murmur heard. Pulmonary/Chest: Effort normal and breath sounds normal. She has no wheezes. She has no rales.  Abdominal: Soft. Bowel sounds are normal.  Musculoskeletal: Normal range of motion. She  exhibits no edema or tenderness.  Ganglion of the radial side of the right wrist.  Neurological: She is alert and oriented to person, place, and time. No cranial nerve deficit. Coordination normal.  Skin: Skin is warm and dry. No rash noted. She is not diaphoretic. No erythema. No pallor.  Psychiatric: She has a normal mood and affect. Her behavior is normal. Thought content normal.    Labs reviewed: Basic Metabolic Panel: Recent Labs    04/17/16 1020 07/31/16 0809 03/19/17 0947  NA 141 141 138  K 4.3 4.5 4.5  CL 102 102 100  CO2 29 31 32  GLUCOSE 100* 93 94  BUN 10 14 17   CREATININE 0.86 0.88 0.84  CALCIUM 9.6 9.2 9.3  TSH 8.13* 8.95* 4.38   Liver Function Tests: Recent Labs    04/17/16 1020 03/19/17 0947  AST 14 16  ALT 13 17  ALKPHOS 102  --   BILITOT 0.7 0.6  PROT 6.7 6.5  ALBUMIN 4.1  --    No results for input(s): LIPASE, AMYLASE in the last 8760 hours. No results for input(s): AMMONIA in the last 8760 hours. CBC: Recent Labs    04/17/16 1020 08/16/16 1712 03/19/17 0947  WBC 8.3 10.4 12.5*  NEUTROABS 4,731 5.1 7,350  HGB 15.7* 15.4* 16.1*  HCT 46.1* 46.2* 48.4*  MCV 94.1 92.7 91.0  PLT 287 264.0 300   Lipid Panel: Recent Labs    04/17/16 1020 03/19/17 0947  CHOL 170 176  HDL 60 75  LDLCALC 84  --   TRIG 132 172*  CHOLHDL 2.8 2.3     TSH: Recent Labs    04/17/16 1020 07/31/16 0809 03/19/17 0947  TSH 8.13* 8.95* 4.38   A1C: Lab Results  Component Value Date   HGBA1C 5.9 (H) 06/17/2013     Assessment/Plan 1. Asthmatic bronchitis , chronic (HCC) -recent excerebration and prednisone use which has improved breathing however still with chest congestion. -mucinex DM by mouth BID for 7 days with full glass of water  - budesonide-formoterol (SYMBICORT) 160-4.5 MCG/ACT inhaler; Inhale 2 puffs by mouth first thing in the morning and 2 puffs about 12 hours later  Dispense: 10.2 g; Refill: 5 - albuterol (PROAIR HFA) 108 (90 Base) MCG/ACT inhaler; INHALE 1 TO 2 PUFFS INTO THE LUNGS EVERY 6 HOURS AS NEEDED FOR WHEEZING OR SHORTNESS OF BREATH  Dispense: 8.5 g; Refill: 2 - doxycycline (VIBRA-TABS) 100 MG tablet; Take 1 tablet (100 mg total) by mouth 2 (two) times daily.  Dispense: 14 tablet; Refill: 0- given to use if symptoms worsen due to long holiday weekend- not to fill if symptoms improve.   2. Essential hypertension Stable, to cont current regimen  - losartan (COZAAR) 50 MG tablet; TAKE 1 TABLET BY MOUTH ONCE DAILY FOR BLOOD PRESSURE  Dispense: 90 tablet; Refill: 1  3. Hypothyroidism, unspecified type -TSH at goal, cont current synthroid.   4. Primary osteoarthritis involving multiple joints -recommended tylenol 650 mg by mouth every 6 hours PRN pain. May use aspercream with lidocaine as needed  5. Overactive bladder Progressively getting worse, she had been recommended medication in the past but did not wish to start at that time. - mirabegron ER (MYRBETRIQ) 25 MG TB24 tablet; Take 1 tablet (25 mg total) by mouth daily.  Dispense: 30 tablet; Refill: 2  6. Moderate episode of recurrent major depressive disorder ([place]) -pt reports she lacks motivation and does not have much she does for joy, she denies SI or HI. She does  have a part-time job that she goes to but does not enjoy. -currently on zoloft will add  Wellbutrin  - buPROPion (WELLBUTRIN XL) 150 MG 24 hr tablet; Take 1 tablet (150 mg total) by mouth daily.  Dispense: 30 tablet; Refill: 1  7. Other hyperlipidemia -triglycerides up, pt reports increase sweets and sugar intake which she will work on. Cont lipitor.   Next appt: 4 weeks on mood.  Carlos American. Harle Battiest  [facility] [phone] 8 am - 5 pm) [phone] (after hours)

## 2017-04-13 ENCOUNTER — Ambulatory Visit: Payer: Medicare Other

## 2017-04-15 ENCOUNTER — Other Ambulatory Visit: Payer: Self-pay | Admitting: Internal Medicine

## 2017-04-17 ENCOUNTER — Ambulatory Visit
Admission: RE | Admit: 2017-04-17 | Discharge: 2017-04-17 | Disposition: A | Discharge status: Home / Self Care | Payer: Medicare Other | Source: Ambulatory Visit | Attending: Nurse Practitioner | Admitting: Nurse Practitioner

## 2017-04-17 DIAGNOSIS — Z1231 Encounter for screening mammogram for malignant neoplasm of breast: Secondary | ICD-10-CM

## 2017-04-19 ENCOUNTER — Encounter: Payer: Self-pay | Admitting: Nurse Practitioner

## 2017-04-19 ENCOUNTER — Other Ambulatory Visit: Payer: Self-pay | Admitting: Nurse Practitioner

## 2017-04-19 ENCOUNTER — Ambulatory Visit (INDEPENDENT_AMBULATORY_CARE_PROVIDER_SITE_OTHER): Payer: Medicare Other | Admitting: Nurse Practitioner

## 2017-04-19 VITALS — BP 122/76 | HR 77 | Temp 97.6°F | Ht 65.0 in | Wt 166.0 lb

## 2017-04-19 DIAGNOSIS — J449 Chronic obstructive pulmonary disease, unspecified: Secondary | ICD-10-CM | POA: Diagnosis not present

## 2017-04-19 DIAGNOSIS — E7849 Other hyperlipidemia: Secondary | ICD-10-CM | POA: Diagnosis not present

## 2017-04-19 DIAGNOSIS — Q046 Congenital cerebral cysts: Secondary | ICD-10-CM | POA: Diagnosis not present

## 2017-04-19 DIAGNOSIS — F17210 Nicotine dependence, cigarettes, uncomplicated: Secondary | ICD-10-CM | POA: Diagnosis not present

## 2017-04-19 DIAGNOSIS — F331 Major depressive disorder, recurrent, moderate: Secondary | ICD-10-CM

## 2017-04-19 DIAGNOSIS — N3281 Overactive bladder: Secondary | ICD-10-CM | POA: Diagnosis not present

## 2017-04-19 MED ORDER — BUPROPION HCL ER (XL) 150 MG PO TB24: 150.0000 mg | ORAL_TABLET | Freq: Every day | ORAL | 1 refills | Status: DC

## 2017-04-19 MED ORDER — MIRABEGRON ER 50 MG PO TB24: 50.0000 mg | ORAL_TABLET | Freq: Every day | ORAL | 2 refills | Status: DC

## 2017-04-19 NOTE — Progress Notes (Signed)
Careteam: Patient Care Team: Lauree Chandler, NP as PCP - General (Geriatric Medicine)  Advanced Directive information    Allergies  Allergen Reactions  . Cafergot   . Codeine   . Fenoprofen Calcium   . Nalfon [Fenoprofen]     Chief Complaint  Patient presents with  . Medical Management of Chronic Issues    4 week follow-up. hair thinning (question related to medications), and decreased memory (got in the shower with her underwear on)   . Medication Refill    No refills needed   . Medication Management    Discuss alternative to Lipitor, would like something other than a statin  . Orders    Discuss scan/screening for cyst on brain      HPI: Patient is a 72 y.o. female seen in the office today for follow up mood. Pt was started on Wellbutrin (added on to zoloft) at last OV due to ongoing depression. Took for a few weeks but then stopped because she did not really feel a difference. No side effects. Still smoking 10 cigarettes daily. Did not curb smoking. Dr Nyoka Cowden recommended it for smoking cessation but she never stuck with that.   Also complaints of OAB- started on myrbetriq but did not continue this- reports it was not helping.  Having hair loss not long after she started taking new medication Also has not been eating right. Does not right. Does not like to go to the store. Get something decent twice a week. Does not eat 3 times daily.   Neurosurgery did CT on head and found cyst on third ventricle which was stable in 2017. Did not recommend further follow up at that time. This was done by Dr. Consuella Lose. Do not see these records in epic  MMSE 29/30 on 03/19/17  Review of Systems:  Review of Systems  Constitutional: Negative for chills, fever and weight loss.  HENT: Negative for sore throat and tinnitus.   Respiratory: Positive for cough, sputum production and wheezing. Negative for shortness of breath.   Cardiovascular: Negative for chest pain,  palpitations and leg swelling.  Gastrointestinal: Negative for abdominal pain, constipation, diarrhea and heartburn.  Genitourinary: Negative for dysuria, frequency and urgency.  Musculoskeletal: Positive for back pain (back ache occasionally) and joint pain. Negative for falls and myalgias.  Skin: Negative.   Neurological: Negative for dizziness and headaches.  Psychiatric/Behavioral: Positive for depression. Negative for memory loss. The patient does not have insomnia.     Past Medical History:  Diagnosis Date  . Chronic airway obstruction, not elsewhere classified   . Depressive disorder   . Depressive disorder, not elsewhere classified   . Emphysema   . Enthesopathy of ankle and tarsus, unspecified   . HTN (hypertension)   . Hyperlipidemia   . Hypertension   . Hypopotassemia   . Leukocytosis   . Migraine, unspecified, without mention of intractable migraine without mention of status migrainosus   . Nonspecific (abnormal) findings on radiological and other examination of skull and head   . Osteoarthrosis, unspecified whether generalized or localized, unspecified site   . Other emphysema ([place])   . Other specified disease of white blood cells   . Palpitations   . Tobacco abuse    Past Surgical History:  Procedure Laterality Date  . CESAREAN SECTION    . COLONOSCOPY  08/14/2008   Dr.. Delfin Edis   Social History:   reports that she has been smoking cigarettes.  She has a 28.00 pack-year smoking  history. she has never used smokeless tobacco. She reports that she does not drink alcohol or use drugs.  Family History  Problem Relation Age of Onset  . Alzheimer's disease Father   . Heart disease Father   . Diabetes Father   . Skin cancer Father   . Heart disease Mother   . Breast cancer Mother     Medications: Patient's Medications  New Prescriptions   No medications on file  Previous Medications   ALBUTEROL (PROAIR HFA) 108 (90 BASE) MCG/ACT INHALER    INHALE 1 TO 2  PUFFS INTO THE LUNGS EVERY 6 HOURS AS NEEDED FOR WHEEZING OR SHORTNESS OF BREATH   ALPRAZOLAM (XANAX) 0.5 MG TABLET    TAKE 1 TABLET BY MOUTH UP TO TWICE DAILY FOR NERVOUSNESS OR SLEEP   ATORVASTATIN (LIPITOR) 40 MG TABLET    TAKE 1 TABLET BY MOUTH DAILY FOR HIGH CHOLESTEROL   BUDESONIDE-FORMOTEROL (SYMBICORT) 160-4.5 MCG/ACT INHALER    Inhale 2 puffs by mouth first thing in the morning and 2 puffs about 12 hours later   CHOLECALCIFEROL (VITAMIN D3) 1000 UNITS CAPS    Take 1 capsule by mouth daily.     LEVOTHYROXINE (SYNTHROID, LEVOTHROID) 112 MCG TABLET    Take 1 tablet (112 mcg total) by mouth daily.   LOSARTAN (COZAAR) 50 MG TABLET    TAKE 1 TABLET BY MOUTH ONCE DAILY FOR BLOOD PRESSURE   SERTRALINE (ZOLOFT) 100 MG TABLET    TAKE 1 TABLET BY MOUTH DAILY  Modified Medications   No medications on file  Discontinued Medications   BUPROPION (WELLBUTRIN XL) 150 MG 24 HR TABLET    Take 1 tablet (150 mg total) by mouth daily.   DOXYCYCLINE (VIBRA-TABS) 100 MG TABLET    Take 1 tablet (100 mg total) by mouth 2 (two) times daily.   MIRABEGRON ER (MYRBETRIQ) 25 MG TB24 TABLET    Take 1 tablet (25 mg total) by mouth daily.   PREDNISONE (DELTASONE) 10 MG TABLET    Take  4 each am x 2 days,   2 each am x 2 days,  1 each am x 2 days and stop     Physical Exam:  Vitals:   04/19/17 1516  BP: 122/76  Pulse: 77  Temp: 97.6 F (36.4 C)  TempSrc: Oral  SpO2: 91%  Weight: 166 lb (75.3 kg)  Height: 5\' 5"  (1.651 m)   Body mass index is 27.62 kg/m.  Physical Exam  Constitutional: She is oriented to person, place, and time. She appears well-developed and well-nourished. No distress.  HENT:  Head: Normocephalic and atraumatic.  Eyes: Conjunctivae and EOM are normal. Pupils are equal, round, and reactive to light. Left eye exhibits no discharge.  Corrective lenses.  Neck: Normal range of motion. Neck supple.  Cardiovascular: Normal rate, regular rhythm and normal heart sounds. Exam reveals no gallop  and no friction rub.  No murmur heard. Pulmonary/Chest: Effort normal. She has wheezes (throughout). She has no rales.  No increase in shortness of breath; chronic cough  Abdominal: Soft. Bowel sounds are normal.  Musculoskeletal: Normal range of motion. She exhibits no edema or tenderness.  Ganglion of the radial side of the right wrist.  Neurological: She is alert and oriented to person, place, and time. No cranial nerve deficit. Coordination normal.  Skin: Skin is warm and dry. No rash noted. She is not diaphoretic. No erythema. No pallor.  Psychiatric: She has a normal mood and affect. Her behavior is normal. Thought content  normal.    Labs reviewed: Basic Metabolic Panel: Recent Labs    07/31/16 0809 03/19/17 0947  NA 141 138  K 4.5 4.5  CL 102 100  CO2 31 32  GLUCOSE 93 94  BUN 14 17  CREATININE 0.88 0.84  CALCIUM 9.2 9.3  TSH 8.95* 4.38   Liver Function Tests: Recent Labs    03/19/17 0947  AST 16  ALT 17  BILITOT 0.6  PROT 6.5   No results for input(s): LIPASE, AMYLASE in the last 8760 hours. No results for input(s): AMMONIA in the last 8760 hours. CBC: Recent Labs    08/16/16 1712 03/19/17 0947  WBC 10.4 12.5*  NEUTROABS 5.1 7,350  HGB 15.4* 16.1*  HCT 46.2* 48.4*  MCV 92.7 91.0  PLT 264.0 300   Lipid Panel: Recent Labs    03/19/17 0947  CHOL 176  HDL 75  TRIG 172*  CHOLHDL 2.3   TSH: Recent Labs    07/31/16 0809 03/19/17 0947  TSH 8.95* 4.38   A1C: Lab Results  Component Value Date   HGBA1C 5.9 (H) 06/17/2013     Assessment/Plan 1. Asthmatic bronchitis , chronic (HCC) -pt with wheezing and rhonchi, reports this is "baseline" for her and without increase RR, shortness of breath or cough. Has used albuterol once today -encouraged smoking cessation.  -following with pulmonary routinely -cont current regimen.   2. Overactive bladder - mirabegron ER (MYRBETRIQ) 50 MG TB24 tablet; Take 1 tablet (50 mg total) by mouth daily.   Dispense: 30 tablet; Refill: 2  3. Moderate episode of recurrent major depressive disorder ([place]) -ongoing, stopped Wellbutrin after a couple of weeks due to no effects. No adverse effect noted. Agreeable to restart and continue until next ov.  - buPROPion (WELLBUTRIN XL) 150 MG 24 hr tablet; Take 1 tablet (150 mg total) by mouth daily.  Dispense: 30 tablet; Refill: 1  4. Cigarette smoker -encouraged smoking cessation, hopefully with continuation of Wellbutrin it will curb cravings.   5. Other hyperlipidemia Discussed statin, pt without side effects just has heard negative reviews on statin. Okay to continue Lipitor at this time after discussion in office.   6. Colloid cyst of third ventricle (HCC) CT head noted in 2017- Colloid cyst of the anterior third ventricle, unchanged. 7 x 9 mm by measurement today. No hydrocephalus. Ordered by Dr Kathyrn Sheriff, will request these records as they are not in epic  Next appt: 2 months  Tyyonna Soucy K. Harle Battiest  [facility] [phone] 8 am - 5 pm) [phone] (after hours)

## 2017-04-19 NOTE — Patient Instructions (Addendum)
To start myrbetriq 50 mg by mouth daily for over active bladder  Restart Wellbutrin 150 mg by mouth daily    Increase protein intake but maintain heart healthy diet.  Attempt to cont to cut back on smoking

## 2017-05-07 ENCOUNTER — Ambulatory Visit: Payer: Medicare Other | Admitting: Nurse Practitioner

## 2017-05-07 ENCOUNTER — Ambulatory Visit
Admission: RE | Admit: 2017-05-07 | Discharge: 2017-05-07 | Disposition: A | Discharge status: Home / Self Care | Payer: Medicare Other | Source: Ambulatory Visit | Attending: Nurse Practitioner | Admitting: Nurse Practitioner

## 2017-05-07 ENCOUNTER — Encounter: Payer: Self-pay | Admitting: Nurse Practitioner

## 2017-05-07 ENCOUNTER — Other Ambulatory Visit: Payer: Self-pay | Admitting: Nurse Practitioner

## 2017-05-07 VITALS — BP 150/80 | HR 91 | Temp 98.5°F | Ht 65.0 in | Wt 162.2 lb

## 2017-05-07 DIAGNOSIS — J111 Influenza due to unidentified influenza virus with other respiratory manifestations: Secondary | ICD-10-CM | POA: Diagnosis not present

## 2017-05-07 DIAGNOSIS — R509 Fever, unspecified: Secondary | ICD-10-CM

## 2017-05-07 DIAGNOSIS — J441 Chronic obstructive pulmonary disease with (acute) exacerbation: Secondary | ICD-10-CM

## 2017-05-07 LAB — POCT INFLUENZA A/B
Influenza A, POC: POSITIVE — AB
Influenza B, POC: NEGATIVE

## 2017-05-07 MED ORDER — PREDNISONE 10 MG (21) PO TBPK: ORAL_TABLET | ORAL | 0 refills | Status: DC

## 2017-05-07 MED ORDER — METHYLPREDNISOLONE ACETATE 80 MG/ML IJ SUSP
80.0000 mg | Freq: Once | INTRAMUSCULAR | Status: AC
  Administered 2017-05-07: 80 mg via INTRAMUSCULAR

## 2017-05-07 MED ORDER — OSELTAMIVIR PHOSPHATE 75 MG PO CAPS: 75.0000 mg | ORAL_CAPSULE | Freq: Two times a day (BID) | ORAL | 0 refills | Status: DC

## 2017-05-07 MED ORDER — DOXYCYCLINE HYCLATE 100 MG PO TABS: 100.0000 mg | ORAL_TABLET | Freq: Two times a day (BID) | ORAL | 0 refills | Status: DC

## 2017-05-07 MED ORDER — IPRATROPIUM-ALBUTEROL 0.5-2.5 (3) MG/3ML IN SOLN: 3.0000 mL | RESPIRATORY_TRACT | 1 refills | Status: DC | PRN

## 2017-05-07 MED ORDER — METHYLPREDNISOLONE ACETATE 80 MG/ML IJ SUSP
80.0000 mg | Freq: Once | INTRAMUSCULAR | Status: AC
Start: 2017-05-07 — End: 2017-05-07
  Administered 2017-05-07: 80 mg via INTRAMUSCULAR

## 2017-05-07 NOTE — Patient Instructions (Addendum)
To use tylenol 650 mg by mouth every 6 hours as needed for sore throat, body aches, fevers duoneb every 4 hours while awake for 3 days then as needed Prednisone taper (start tomorrow)  Tamiflu twice daily for 5 days  Doxycycline 100 mg by mouth twice daily for 7 days mucinex DM by mouth with full glass of water twice daily for 7 days Increase water intake  To use probiotic twice daily To take yogurt To take medication with food.  Wear mask when you are out in the community   Chronic Obstructive Pulmonary Disease Exacerbation Chronic obstructive pulmonary disease (COPD) is a common lung problem. In COPD, the flow of air from the lungs is limited. COPD exacerbations are times that breathing gets worse and you need extra treatment. Without treatment they can be life threatening. If they happen often, your lungs can become more damaged. If your COPD gets worse, your doctor may treat you with:  Medicines.  Oxygen.  Different ways to clear your airway, such as using a mask.  Follow these instructions at home:  Do not smoke.  Avoid tobacco smoke and other things that bother your lungs.  If given, take your antibiotic medicine as told. Finish the medicine even if you start to feel better.  Only take medicines as told by your doctor.  Drink enough fluids to keep your pee (urine) clear or pale yellow (unless your doctor has told you not to).  Use a cool mist machine (vaporizer).  If you use oxygen or a machine that turns liquid medicine into a mist (nebulizer), continue to use them as told.  Keep up with shots (vaccinations) as told by your doctor.  Exercise regularly.  Eat healthy foods.  Keep all doctor visits as told. Get help right away if:  You are very short of breath and it gets worse.  You have trouble talking.  You have bad chest pain.  You have blood in your spit (sputum).  You have a fever.  You keep throwing up (vomiting).  You feel weak, or you pass out  (faint).  You feel confused.  You keep getting worse. This information is not intended to replace advice given to you by your health care provider. Make sure you discuss any questions you have with your health care provider. Document Released: 03/09/2011 Document Revised: 08/26/2015 Document Reviewed: 11/22/2012 Elsevier Interactive Patient Education  2017 [employer].

## 2017-05-07 NOTE — Progress Notes (Signed)
Careteam: Patient Care Team: Lauree Chandler, NP as PCP - General (Geriatric Medicine)  Advanced Directive information    Allergies  Allergen Reactions  . Cafergot   . Codeine   . Fenoprofen Calcium   . Nalfon [Fenoprofen]     Chief Complaint  Patient presents with  . Acute Visit    ST, body aches, cough, temp 101.1 at home, wheezing     HPI: Patient is a 72 y.o. female seen in the office today due to sore throat and body aches and headache for 3 days.  People at work have been sick.  Yesterday temp was 101.1. No elevated temperature since but has been taking ibuprofen. Waking up coughing in the middle of the night. Reports she has been confused.  No nasal congestion. No increase in chest congestion.  More wheezing and more shortness of breath proair has not been helping.  Has a nebulizer at home but does not use it.  Increase shortness of breath.    Review of Systems:  Review of Systems  Constitutional: Positive for chills, fever and malaise/fatigue.  HENT: Positive for sore throat. Negative for congestion, ear discharge, ear pain, hearing loss and sinus pain.   Respiratory: Positive for cough, shortness of breath and wheezing. Negative for sputum production.   Cardiovascular: Negative for chest pain.  Neurological: Positive for weakness and headaches. Negative for dizziness.    Past Medical History:  Diagnosis Date  . Chronic airway obstruction, not elsewhere classified   . Depressive disorder   . Depressive disorder, not elsewhere classified   . Emphysema   . Enthesopathy of ankle and tarsus, unspecified   . HTN (hypertension)   . Hyperlipidemia   . Hypertension   . Hypopotassemia   . Leukocytosis   . Migraine, unspecified, without mention of intractable migraine without mention of status migrainosus   . Nonspecific (abnormal) findings on radiological and other examination of skull and head   . Osteoarthrosis, unspecified whether generalized or  localized, unspecified site   . Other emphysema ([place])   . Other specified disease of white blood cells   . Palpitations   . Tobacco abuse    Past Surgical History:  Procedure Laterality Date  . CESAREAN SECTION    . COLONOSCOPY  08/14/2008   Dr.. Delfin Edis   Social History:   reports that she has been smoking cigarettes.  She has a 28.00 pack-year smoking history. she has never used smokeless tobacco. She reports that she does not drink alcohol or use drugs.  Family History  Problem Relation Age of Onset  . Alzheimer's disease Father   . Heart disease Father   . Diabetes Father   . Skin cancer Father   . Heart disease Mother   . Breast cancer Mother     Medications: Patient's Medications  New Prescriptions   No medications on file  Previous Medications   ALBUTEROL (PROAIR HFA) 108 (90 BASE) MCG/ACT INHALER    INHALE 1 TO 2 PUFFS INTO THE LUNGS EVERY 6 HOURS AS NEEDED FOR WHEEZING OR SHORTNESS OF BREATH   ALPRAZOLAM (XANAX) 0.5 MG TABLET    TAKE 1 TABLET BY MOUTH UP TO TWICE DAILY FOR NERVOUSNESS OR SLEEP   ATORVASTATIN (LIPITOR) 40 MG TABLET    TAKE 1 TABLET BY MOUTH DAILY FOR HIGH CHOLESTEROL   BUDESONIDE-FORMOTEROL (SYMBICORT) 160-4.5 MCG/ACT INHALER    Inhale 2 puffs by mouth first thing in the morning and 2 puffs about 12 hours later   BUPROPION (  WELLBUTRIN XL) 150 MG 24 HR TABLET    TAKE 1 TABLET(150 MG) BY MOUTH DAILY   CHOLECALCIFEROL (VITAMIN D3) 1000 UNITS CAPS    Take 1 capsule by mouth daily.     LEVOTHYROXINE (SYNTHROID, LEVOTHROID) 112 MCG TABLET    Take 1 tablet (112 mcg total) by mouth daily.   LOSARTAN (COZAAR) 50 MG TABLET    TAKE 1 TABLET BY MOUTH ONCE DAILY FOR BLOOD PRESSURE   MIRABEGRON ER (MYRBETRIQ) 50 MG TB24 TABLET    Take 1 tablet (50 mg total) by mouth daily.   SERTRALINE (ZOLOFT) 100 MG TABLET    TAKE 1 TABLET BY MOUTH DAILY  Modified Medications   No medications on file  Discontinued Medications   No medications on file     Physical  Exam:  Vitals:   05/07/17 0939  BP: (!) 150/80  Pulse: 91  Temp: 98.5 F (36.9 C)  SpO2: 90%  Weight: 162 lb 3.2 oz (73.6 kg)  Height: 5\' 5"  (1.651 m)   Body mass index is 26.99 kg/m.  Physical Exam  Constitutional: She is oriented to person, place, and time. She appears well-developed and well-nourished.  HENT:  Head: Normocephalic and atraumatic.  Right Ear: External ear normal.  Left Ear: External ear normal.  Nose: Mucosal edema and rhinorrhea present.  Mouth/Throat: Mucous membranes are normal. Posterior oropharyngeal erythema present. No oropharyngeal exudate or posterior oropharyngeal edema.  Cardiovascular: Normal rate, regular rhythm and normal heart sounds.  Pulmonary/Chest: No accessory muscle usage. Tachypnea noted. No respiratory distress. She has wheezes (throughout). She has rhonchi (throughout).  Neurological: She is alert and oriented to person, place, and time.  Skin: Skin is warm and dry.  Psychiatric: She has a normal mood and affect.    Labs reviewed: Basic Metabolic Panel: Recent Labs    07/31/16 0809 03/19/17 0947  NA 141 138  K 4.5 4.5  CL 102 100  CO2 31 32  GLUCOSE 93 94  BUN 14 17  CREATININE 0.88 0.84  CALCIUM 9.2 9.3  TSH 8.95* 4.38   Liver Function Tests: Recent Labs    03/19/17 0947  AST 16  ALT 17  BILITOT 0.6  PROT 6.5   No results for input(s): LIPASE, AMYLASE in the last 8760 hours. No results for input(s): AMMONIA in the last 8760 hours. CBC: Recent Labs    08/16/16 1712 03/19/17 0947  WBC 10.4 12.5*  NEUTROABS 5.1 7,350  HGB 15.4* 16.1*  HCT 46.2* 48.4*  MCV 92.7 91.0  PLT 264.0 300   Lipid Panel: Recent Labs    03/19/17 0947  CHOL 176  HDL 75  TRIG 172*  CHOLHDL 2.3   TSH: Recent Labs    07/31/16 0809 03/19/17 0947  TSH 8.95* 4.38   A1C: Lab Results  Component Value Date   HGBA1C 5.9 (H) 06/17/2013     Assessment/Plan 1. COPD exacerbation ([place]) -due to influenza shortness of breath  improved after duoneb which was given in office, baseline O2 91-95% based on previous notes To use duoneb every 4 hours while awake for 3 days then as needed -mucinex DM by mouth twice daily for 7 days  - ipratropium-albuterol (DUONEB) 0.5-2.5 (3) MG/3ML SOLN; Take 3 mLs by nebulization every 4 (four) hours as needed.  Dispense: 360 mL; Refill: 1 - DG Chest 2 View -rule out pneumonia with influenza  - predniSONE (STERAPRED UNI-PAK 21 TAB) 10 MG (21) TBPK tablet; Use as directed  Dispense: 21 tablet; Refill: 0 - doxycycline (  VIBRA-TABS) 100 MG tablet; Take 1 tablet (100 mg total) by mouth 2 (two) times daily.  Dispense: 14 tablet; Refill: 0 - methylPREDNISolone acetate (DEPO-MEDROL) injection 80 mg given in office to start prednisone taper tomorrow  - PR INHAL RX, AIRWAY OBST/DX SPUTUM INDUCT   2. Fever, unspecified fever cause -influenza positive   3. Influenza -increase hydration - oseltamivir (TAMIFLU) 75 MG capsule; Take 1 capsule (75 mg total) by mouth 2 (two) times daily.  Dispense: 10 capsule; Refill: 0 -strict precautions to go to ED if shortness of breath does not improve or worsens, fever worsens, or unable to keep fluids/medications down.   Carlos American. Harle Battiest  [facility] [phone] 8 am - 5 pm) [phone] (after hours)

## 2017-05-09 ENCOUNTER — Ambulatory Visit: Payer: Medicare Other | Admitting: Nurse Practitioner

## 2017-05-09 ENCOUNTER — Encounter: Payer: Self-pay | Admitting: Nurse Practitioner

## 2017-05-09 VITALS — BP 124/78 | HR 63 | Temp 98.0°F | Ht 65.0 in | Wt 160.0 lb

## 2017-05-09 DIAGNOSIS — J441 Chronic obstructive pulmonary disease with (acute) exacerbation: Secondary | ICD-10-CM | POA: Diagnosis not present

## 2017-05-09 DIAGNOSIS — J111 Influenza due to unidentified influenza virus with other respiratory manifestations: Secondary | ICD-10-CM | POA: Diagnosis not present

## 2017-05-09 MED ORDER — IPRATROPIUM-ALBUTEROL 0.5-2.5 (3) MG/3ML IN SOLN: 3.0000 mL | Freq: Four times a day (QID) | RESPIRATORY_TRACT | Status: DC

## 2017-05-09 NOTE — Progress Notes (Signed)
Careteam: Patient Care Team: Lauree Chandler, NP as PCP - General (Geriatric Medicine)  Advanced Directive information    Allergies  Allergen Reactions  . Cafergot   . Codeine   . Fenoprofen Calcium   . Nalfon [Fenoprofen]     Chief Complaint  Patient presents with  . Follow-up    Pt is being seen for a follow up on flu symptoms.      HPI: Patient is a 72 y.o. female seen in the office today for a 2 day follow up on influenza A and COPD exacerbation.  States she feels much better then she did 2 days ago.  Denies fever, chills, or poor appetite in the last 24 hours. Has not taken any tylenol or ibuprofen. Does states she still has SOB with exertion but shortness of breath overall has improved.  Endorses occasional cough, mostly nonproductive, but at times it is productive with thick white sputum.  Cough occurs mostly during the day.  Also, endorses  Generalized weakness.  She has been taking the doxycycline, prednisone taper, Simbicort, and tamiflu as prescribed. No GI symptoms, using florastor.  She has been using her proair inhaler 3-4 times per day for SOB because her nebulizer does not work.  Has been eating and drinking well over the past 24 hours.   Also, forgot to use the Mucinex DM.  Reports smoking 2 cigarettes yesterday despite being advised not to smoke during this time.  Review of Systems:  Review of Systems  Constitutional: Negative for chills, fever and malaise/fatigue.  HENT: Negative for ear pain, sinus pain and sore throat.   Eyes: Negative.   Respiratory: Positive for cough, sputum production, shortness of breath and wheezing. Negative for hemoptysis.   Cardiovascular: Negative for chest pain, palpitations and leg swelling.  Gastrointestinal: Negative for abdominal pain, heartburn, nausea and vomiting.  Genitourinary: Negative.   Musculoskeletal: Negative.   Skin: Negative.   Neurological: Positive for weakness. Negative for dizziness and headaches.    Psychiatric/Behavioral: Negative.     Past Medical History:  Diagnosis Date  . Chronic airway obstruction, not elsewhere classified   . Depressive disorder   . Depressive disorder, not elsewhere classified   . Emphysema   . Enthesopathy of ankle and tarsus, unspecified   . HTN (hypertension)   . Hyperlipidemia   . Hypertension   . Hypopotassemia   . Leukocytosis   . Migraine, unspecified, without mention of intractable migraine without mention of status migrainosus   . Nonspecific (abnormal) findings on radiological and other examination of skull and head   . Osteoarthrosis, unspecified whether generalized or localized, unspecified site   . Other emphysema ([place])   . Other specified disease of white blood cells   . Palpitations   . Tobacco abuse    Past Surgical History:  Procedure Laterality Date  . CESAREAN SECTION    . COLONOSCOPY  08/14/2008   Dr.. Delfin Edis   Social History:   reports that she has been smoking cigarettes.  She has a 28.00 pack-year smoking history. she has never used smokeless tobacco. She reports that she does not drink alcohol or use drugs.  Family History  Problem Relation Age of Onset  . Alzheimer's disease Father   . Heart disease Father   . Diabetes Father   . Skin cancer Father   . Heart disease Mother   . Breast cancer Mother     Medications: Patient's Medications  New Prescriptions   No medications on file  Previous Medications   ALBUTEROL (PROAIR HFA) 108 (90 BASE) MCG/ACT INHALER    INHALE 1 TO 2 PUFFS INTO THE LUNGS EVERY 6 HOURS AS NEEDED FOR WHEEZING OR SHORTNESS OF BREATH   ALPRAZOLAM (XANAX) 0.5 MG TABLET    TAKE 1 TABLET BY MOUTH UP TO TWICE DAILY FOR NERVOUSNESS OR SLEEP   ATORVASTATIN (LIPITOR) 40 MG TABLET    TAKE 1 TABLET BY MOUTH DAILY FOR HIGH CHOLESTEROL   BUDESONIDE-FORMOTEROL (SYMBICORT) 160-4.5 MCG/ACT INHALER    Inhale 2 puffs by mouth first thing in the morning and 2 puffs about 12 hours later   BUPROPION  (WELLBUTRIN XL) 150 MG 24 HR TABLET    TAKE 1 TABLET(150 MG) BY MOUTH DAILY   CHOLECALCIFEROL (VITAMIN D3) 1000 UNITS CAPS    Take 1 capsule by mouth daily.     DOXYCYCLINE (VIBRA-TABS) 100 MG TABLET    Take 1 tablet (100 mg total) by mouth 2 (two) times daily.   IPRATROPIUM-ALBUTEROL (DUONEB) 0.5-2.5 (3) MG/3ML SOLN    Take 3 mLs by nebulization every 4 (four) hours as needed.   LEVOTHYROXINE (SYNTHROID, LEVOTHROID) 112 MCG TABLET    Take 1 tablet (112 mcg total) by mouth daily.   LOSARTAN (COZAAR) 50 MG TABLET    TAKE 1 TABLET BY MOUTH ONCE DAILY FOR BLOOD PRESSURE   OSELTAMIVIR (TAMIFLU) 75 MG CAPSULE    Take 1 capsule (75 mg total) by mouth 2 (two) times daily.   PREDNISONE (STERAPRED UNI-PAK 21 TAB) 10 MG (21) TBPK TABLET    Use as directed   SERTRALINE (ZOLOFT) 100 MG TABLET    TAKE 1 TABLET BY MOUTH DAILY  Modified Medications   No medications on file  Discontinued Medications   MIRABEGRON ER (MYRBETRIQ) 50 MG TB24 TABLET    Take 1 tablet (50 mg total) by mouth daily.     Physical Exam:  Vitals:   05/09/17 1027  BP: 124/78  Pulse: 63  Temp: 98 F (36.7 C)  TempSrc: Oral  SpO2: 94%  Weight: 160 lb (72.6 kg)  Height: 5\' 5"  (1.651 m)   Body mass index is 26.63 kg/m.  Physical Exam  Constitutional: She is oriented to person, place, and time. She appears well-developed.  HENT:  Head: Normocephalic and atraumatic.  Nose: Mucosal edema present. Right sinus exhibits no maxillary sinus tenderness and no frontal sinus tenderness. Left sinus exhibits no maxillary sinus tenderness and no frontal sinus tenderness.  Mouth/Throat: Posterior oropharyngeal erythema present.  Eyes: Pupils are equal, round, and reactive to light.  Neck: Normal range of motion. Neck supple. No JVD present.  Cardiovascular: Normal rate and regular rhythm.  Pulmonary/Chest: She has wheezes. She has rales in the right middle field and the right lower field.  Wheezes on experation through out lungs    Abdominal: Soft. Bowel sounds are normal.  Lymphadenopathy:    She has no cervical adenopathy.  Neurological: She is alert and oriented to person, place, and time.  Skin: Skin is warm and dry. Capillary refill takes less than 2 seconds.  Psychiatric: She has a normal mood and affect. Her behavior is normal. Judgment and thought content normal.    Labs reviewed: Basic Metabolic Panel: Recent Labs    07/31/16 0809 03/19/17 0947  NA 141 138  K 4.5 4.5  CL 102 100  CO2 31 32  GLUCOSE 93 94  BUN 14 17  CREATININE 0.88 0.84  CALCIUM 9.2 9.3  TSH 8.95* 4.38   Liver Function Tests: Recent Labs  03/19/17 0947  AST 16  ALT 17  BILITOT 0.6  PROT 6.5   No results for input(s): LIPASE, AMYLASE in the last 8760 hours. No results for input(s): AMMONIA in the last 8760 hours. CBC: Recent Labs    08/16/16 1712 03/19/17 0947  WBC 10.4 12.5*  NEUTROABS 5.1 7,350  HGB 15.4* 16.1*  HCT 46.2* 48.4*  MCV 92.7 91.0  PLT 264.0 300   Lipid Panel: Recent Labs    03/19/17 0947  CHOL 176  HDL 75  TRIG 172*  CHOLHDL 2.3   TSH: Recent Labs    07/31/16 0809 03/19/17 0947  TSH 8.95* 4.38   A1C: Lab Results  Component Value Date   HGBA1C 5.9 (H) 06/17/2013     Assessment/Plan 1. Influenza Overall Improving Finish full course of Tamiflu Continue to stay well hydrated Pt has been without fever for over 24 hours Follow up if shortness of breath worsens, unable to keep fluids/medications down, or any other worrisome concerns   2. COPD exacerbation ([facility]) Improving: Oxygen saturation today on RA is 94% Received - ipratropium-albuterol (DUONEB) 0.5-2.5 (3) MG/3ML nebulizer solution 3 mL treatment in office today and lungs sounded much better post treatment with decreased in wheezing Received new nebulizer today in office, pt demonstrated usage Continue to use duoneb as prescribed Continue to use Simbicort inhaler as prescribed To complete full course of doxycycline and  prednisone Pt not taking mucinex: encouraged mucinex DM by mouth twice daily Smoking cessation advised, pt states she will try not to smoke cigarettes while sick   Next appt: 06/18/2017 as scheduled, to keep follow up with Dr Melvyn Novas as scheduled.  Carlos American. Harle Battiest  [facility] [phone] 8 am - 5 pm) [phone] (after hours)

## 2017-05-09 NOTE — Patient Instructions (Signed)
To start duonebs as directed Continue all medication until complete Make sure you are staying hydrated   Influenza, Adult Influenza ("the flu") is an infection in the lungs, nose, and throat (respiratory tract). It is caused by a virus. The flu causes many common cold symptoms, as well as a high fever and body aches. It can make you feel very sick. The flu spreads easily from person to person (is contagious). Getting a flu shot (influenza vaccination) every year is the best way to prevent the flu. Follow these instructions at home:  Take over-the-counter and prescription medicines only as told by your doctor.  Use a cool mist humidifier to add moisture (humidity) to the air in your home. This can make it easier to breathe.  Rest as needed.  Drink enough fluid to keep your pee (urine) clear or pale yellow.  Cover your mouth and nose when you cough or sneeze.  Wash your hands with soap and water often, especially after you cough or sneeze. If you cannot use soap and water, use hand sanitizer.  Stay home from work or school as told by your doctor. Unless you are visiting your doctor, try to avoid leaving home until your fever has been gone for 24 hours without the use of medicine.  Keep all follow-up visits as told by your doctor. This is important. How is this prevented?  Getting a yearly (annual) flu shot is the best way to avoid getting the flu. You may get the flu shot in late summer, fall, or winter. Ask your doctor when you should get your flu shot.  Wash your hands often or use hand sanitizer often.  Avoid contact with people who are sick during cold and flu season.  Eat healthy foods.  Drink plenty of fluids.  Get enough sleep.  Exercise regularly. Contact a doctor if:  You get new symptoms.  You have: ? Chest pain. ? Watery poop (diarrhea). ? A fever.  Your cough gets worse.  You start to have more mucus.  You feel sick to your stomach (nauseous).  You  throw up (vomit). Get help right away if:  You start to be short of breath or have trouble breathing.  Your skin or nails turn a bluish color.  You have very bad pain or stiffness in your neck.  You get a sudden headache.  You get sudden pain in your face or ear.  You cannot stop throwing up. This information is not intended to replace advice given to you by your health care provider. Make sure you discuss any questions you have with your health care provider. Document Released: 12/28/2007 Document Revised: 08/26/2015 Document Reviewed: 01/12/2015 Elsevier Interactive Patient Education  2017 [employer].

## 2017-05-14 ENCOUNTER — Telehealth: Payer: Self-pay | Admitting: *Deleted

## 2017-05-14 NOTE — Telephone Encounter (Signed)
Patient notified

## 2017-05-14 NOTE — Telephone Encounter (Signed)
Patient called and stated that today was her last day of her Antibiotic and she still feels very fatigued. Stated that she feels better but fatigued and wonders if her antibiotic should be extended. Please Advise.

## 2017-05-14 NOTE — Telephone Encounter (Signed)
Probably not, she had the flu and that is very exhausting, to stay hydrated and cont breathing treatments as needed. If symptoms worsen to call and make a follow up appt.

## 2017-05-18 ENCOUNTER — Telehealth: Payer: Self-pay | Admitting: *Deleted

## 2017-05-18 NOTE — Telephone Encounter (Signed)
Called and [facility] for patient to return call.

## 2017-05-18 NOTE — Telephone Encounter (Signed)
Patient called c/o headache  1. How severe is headache on a scale of 1-10 (10 being the worse pain you have experienced)? 8-9  2. Where is it located? Left side of head (between side and top and feels it behind left eye)  3. How long have you had it? Started 2 days ago and has gradually gotten worse.   4. Any other symptoms like blurry vision, weakness of arm or leg, speech changes, dizziness, or chest pain? No not that she can tell  5. Any recent falls or head injury? none  6. Are you on a blood thinner (check medication list) none  7.) Have you taken OTC medication to assist with headache? None  8.) Having any Nasal Congestion, Runny nose? Occasionally   9.) Have you had these types of Headaches before? No  I will forward your response to your provider and call you with instructions, if your symptoms persist or progress please seek medical attention at [facility] or the Emergency Room.

## 2017-05-18 NOTE — Telephone Encounter (Signed)
Patient notified and agreed.  

## 2017-05-18 NOTE — Telephone Encounter (Signed)
No Neck Stiffness No Fever No Sinus Symptoms except occasional allergies.

## 2017-05-18 NOTE — Telephone Encounter (Signed)
How frequently and intense are the headaches? Does she have any other symptoms associated with headache? Any neck stiffness? blurred vision? Weakness? Worsening fatigue or body aches? Fever  Has she had these types of headaches before? Is she having any nasal congestion, runny nose?

## 2017-05-18 NOTE — Telephone Encounter (Signed)
Any neck stiffness? Fever? Currently having sinus symptoms?

## 2017-05-18 NOTE — Telephone Encounter (Signed)
She can try to take tylenol 500 mg 1-2 tablets every 8 hours as needed for headache.  Make sure she is staying well hydrated if she starts experiencing any symptoms like blurry vision, neck stiffness, body aches, weakness to body or weakness of arm or leg, speech changes, dizziness, or chest pain will need to go to urgent care or ED.  Please offer appt on Monday if symptoms continue

## 2017-05-18 NOTE — Telephone Encounter (Signed)
Patient called and left message on clinical intake stating that she has been having periodically Headaches in one spot at a time in her head. Stated it hurts real bad for about 30 sec and goes away. Wonders if it is coming from the flu she had. Stated that we can't call her this morning due to working but wanted to know your advise. Please Advise.

## 2017-05-23 ENCOUNTER — Encounter: Payer: Self-pay | Admitting: Internal Medicine

## 2017-05-23 ENCOUNTER — Ambulatory Visit: Payer: Medicare Other | Admitting: Internal Medicine

## 2017-05-23 VITALS — BP 140/80 | HR 82 | Ht 65.0 in | Wt 159.4 lb

## 2017-05-23 DIAGNOSIS — J441 Chronic obstructive pulmonary disease with (acute) exacerbation: Secondary | ICD-10-CM | POA: Diagnosis not present

## 2017-05-23 DIAGNOSIS — J449 Chronic obstructive pulmonary disease, unspecified: Secondary | ICD-10-CM

## 2017-05-23 DIAGNOSIS — F17210 Nicotine dependence, cigarettes, uncomplicated: Secondary | ICD-10-CM

## 2017-05-23 MED ORDER — PREDNISONE 10 MG (21) PO TBPK: ORAL_TABLET | ORAL | 11 refills | Status: DC

## 2017-05-23 NOTE — Patient Instructions (Addendum)
Plan A = Automatic/ no matter  = symbicort 160 Take 2 puffs first thing in am and then another 2 puffs about 12 hours later.   Work on Engineer, technical sales technique:  relax and gently blow all the way out then take a nice smooth deep breath back in, triggering the inhaler at same time you start breathing in.  Hold for up to 5 seconds if you can. Blow out thru nose. Rinse and gargle with water when done      Plan B = Backup Only use your albuterol (Proair) as a rescue medication to be used if you can't catch your breath by resting or doing a relaxed purse lip breathing pattern.  - The less you use it, the better it will work when you need it. - Ok to use the inhaler up to 2 puffs  every 4 hours if you must but call for appointment if use goes up over your usual need - Don't leave home without it !!  (think of it like the spare tire for your car)   Plan C = Crisis - only use your albuterol nebulizer if you first try Plan B and it fails to help > ok to use the nebulizer up to every 4 hours but if start needing it regularly call for immediate appointment   Plan D = Deltasone (prednisone) - Prednisone 10 mg take  4 each am x 2 days,   2 each am x 2 days,  1 each am x 2 days and stop      Please schedule a follow up visit in 3 months but call sooner if needed  with all medications /inhalers/ solutions in hand so we can verify exactly what you are taking. This includes all medications from all doctors and over the counters  - PFTs on return

## 2017-05-23 NOTE — Progress Notes (Signed)
Subjective:   Patient ID: Gabrielle White, female    DOB: 1945-04-21 .   MRN: [id number]    Brief patient profile:  81   yowf active smoker with AB/GOLD 0 copd by pfts 03/08/15   History of Present Illness  01/22/2015 acute extended re-establish ov/Gabrielle White re:  AB/ still smoking  Chief Complaint  Patient presents with  . Pulmonary Consult    pt last seen in 2014. pt states DR. Green wanted her to follow up. pt states somethines her throat feels like it closes up and she cant breath. pt states she feels like she has to take really deep breaths. pt c/o chest tightness, and prod cough white in color.pt c/o thrush she thiks may come from the inhailers.     Last better breathing  x 4 weeks prior to OV  p using prednisone but benefit only for a few days p stopped despite maint rx with symbicort (though hfa poor)  Nl routine is symbicort 160 2 bid and maybe ventolin and occ brovana no recent duoneb need  In retrospect really hasn't been able to really stay well x 4 y Cough is harsh and congested worse day than noct  rec Start Prednisone 10mg  Take 4 for two days three for two days two for two days one for two days  Plan A =  Automatic = symbicort 160 Take 2 puffs first thing in am and then another 2 puffs about 12 hours later.  Work on Interior and spatial designer: Plan B = Backup  Only use your albuterol  Plan C = crisis  Only use nebulizer iprotropium-albuterol (duoneb) if you try the ventolin first and it fails to help > ok to use up to every 4 hours  Plan D = doctor call us if not improving  Plan E = ER > go there if all else fails Try prilosec otc 20mg   Take 30-60 min before first meal of the day and Pepcid ac (famotidine) 20 mg one @  bedtime    GERD (REFLUX) diet      11/20/2016  f/u ov/Gabrielle White re:   Copd GOLD 0/AB still smoking / maint symb 160 2bid  Chief Complaint  Patient presents with  . Follow-up    Breathing has been worse this summer- relates to humidity. She has been coughing  some and wheezing. Cough is prod with clear to white sputum.  She is using proair 2-3 x per day.   Symptoms are worse when out in humidity x sev months / denies missing doses of symb but needing proair at least twice daily / also some noct and early am cough/ congestion  rec Plan A = Automatic = symbicort 160 Take 2 puffs first thing in am and then another 2 puffs about 12 hours later.  Work on inhaler technique:  relax and gently blow all the way out then take a nice smooth deep breath back in, triggering the inhaler at same time you start breathing in.  Hold for up to 5 seconds if you can. Blow out thru nose. Rinse and gargle with water when done Plan B = Backup Only use your albuterol as a rescue medication Plan C= Cortisone If having to use more albuterol than usual or much worse breathing > Prednisone 10 mg take  4 each am x 2 days,   2 each am x 2 days,  1 each am x 2 days and stop  The key is to stop smoking completely before smoking completely  stops you!       12/12/2016  f/u ov/Gabrielle White re:  Copd GOLD 0/ AB/ still smoking  On symb 160 2bid  Chief Complaint  Patient presents with  . Follow-up    CXR done today  doe = MMRC1 = can walk nl pace, flat grade, can't hurry or go uphills or steps s sob   No am excess mucus / did not activate plan c since last ov  saba avg 2-3 / day  rec Plan A = Automatic = symbicort 160 Take 2 puffs first thing in am and then another 2 puffs about 12 hours later.  Plan B = Backup Only use your albuterol as a rescue medication  Plan C= Cortisone If having to use more albuterol than usual or much worse breathing > Prednisone 10 mg take  4 each am x 2 days,   2 each am x 2 days,  1 each am x 2 days and stop  The key is to stop smoking completely before smoking completely stops you!      05/23/2017  f/u ov/Gabrielle White re:  COPD GOLD 0 / AB still smoking Chief Complaint  Patient presents with  . Follow-up    Breathing is unchanged. She rarely uses her albuterol  inhaler, but she uses her neb 2 x daily on average.    Dyspnea:  Still MMRC1 = can walk nl pace, flat grade, can't hurry or go uphills or steps s sob   Cough: mostly at hs /no am congestion Sleep: ok 2 pillows  - cough does not keep her up   Confused with how to use neb vs maint symb "aren't they basically the same"  No obvious day to day or daytime variability or assoc excess/ purulent sputum or mucus plugs or hemoptysis or cp or chest tightness, subjective wheeze or overt sinus or hb symptoms. No unusual exposure hx or h/o childhood pna/ asthma or knowledge of premature birth.  Sleeping ok 2 pillows/ no 02 without nocturnal  or early am exacerbation  of respiratory  c/o's or need for noct saba. Also denies any obvious fluctuation of symptoms with weather or environmental changes or other aggravating or alleviating factors except as outlined above   Current Allergies, Complete Past Medical History, Past Surgical History, Family History, and Social History were reviewed in [employer] record.  ROS  The following are not active complaints unless bolded Hoarseness, sore throat, dysphagia, dental problems, itching, sneezing,  nasal congestion or discharge of excess mucus or purulent secretions, ear ache,   fever, chills, sweats, unintended wt loss or wt gain, classically pleuritic or exertional cp,  orthopnea pnd or leg swelling, presyncope, palpitations, abdominal pain, anorexia, nausea, vomiting, diarrhea  or change in bowel habits or change in bladder habits, change in stools or change in urine, dysuria, hematuria,  rash, arthralgias, visual complaints, headache, numbness, weakness or ataxia or problems with walking or coordination,  change in mood/affect or memory.        Current Meds  Medication Sig  . albuterol (PROAIR HFA) 108 (90 Base) MCG/ACT inhaler INHALE 1 TO 2 PUFFS INTO THE LUNGS EVERY 6 HOURS AS NEEDED FOR WHEEZING OR SHORTNESS OF BREATH  . ALPRAZolam (XANAX) 0.5  MG tablet TAKE 1 TABLET BY MOUTH UP TO TWICE DAILY FOR NERVOUSNESS OR SLEEP  . atorvastatin (LIPITOR) 40 MG tablet TAKE 1 TABLET BY MOUTH DAILY FOR HIGH CHOLESTEROL  . budesonide-formoterol (SYMBICORT) 160-4.5 MCG/ACT inhaler Inhale 2 puffs by mouth first thing  in the morning and 2 puffs about 12 hours later  . Cholecalciferol (VITAMIN D3) 1000 UNITS CAPS Take 1 capsule by mouth daily.    Marland Kitchen ipratropium-albuterol (DUONEB) 0.5-2.5 (3) MG/3ML SOLN Take 3 mLs by nebulization every 4 (four) hours as needed.  Marland Kitchen levothyroxine (SYNTHROID, LEVOTHROID) 112 MCG tablet Take 1 tablet (112 mcg total) by mouth daily.  Marland Kitchen losartan (COZAAR) 50 MG tablet TAKE 1 TABLET BY MOUTH ONCE DAILY FOR BLOOD PRESSURE  . sertraline (ZOLOFT) 100 MG tablet TAKE 1 TABLET BY MOUTH DAILY   Current Facility-Administered Medications for the 05/23/17 encounter (Office Visit) with Tanda Rockers, MD  Medication  . ipratropium-albuterol (DUONEB) 0.5-2.5 (3) MG/3ML nebulizer solution 3 mL              Objective:   Physical Exam    amb wf nad   Vital signs reviewed - Note on arrival 02 sats  95% on RA       01/22/2015      156 > 03/08/2015   162 > 06/07/2015  170 > 03/20/2016 160 > 05/15/2016   162 >   08/16/2016 163 > 11/20/2016  161 > 12/12/2016  162 > 05/23/2017  159    01/16/13 156 lb (70.761 kg)  01/08/13 155 lb 3.2 oz (70.398 kg)  12/20/12 157 lb 9.6 oz (71.487 kg)         HEENT: nl dentition, turbinates bilaterally, and oropharynx. Nl external ear canals without cough reflex   NECK :  without JVD/Nodes/TM/ nl carotid upstrokes bilaterally   LUNGS: no acc muscle use,  Nl contour chest with distant mid exp rhonchi bilaterally better with plm  CV:  RRR  no s3 or murmur or increase in P2, and no edema   ABD:  soft and nontender with nl inspiratory excursion in the supine position. No bruits or organomegaly appreciated, bowel sounds nl  MS:  Nl gait/ ext warm without deformities, calf tenderness, cyanosis or  clubbing No obvious joint restrictions   SKIN: warm and dry without lesions    NEURO:  alert, approp, nl sensorium with  no motor or cerebellar deficits apparent.      I personally reviewed images and agree with radiology impression as follows:  CXR:   05/07/17 COPD.  No active cardiopulmonary disease.             Assessment & Plan:

## 2017-05-24 ENCOUNTER — Encounter: Payer: Self-pay | Admitting: Internal Medicine

## 2017-05-24 NOTE — Assessment & Plan Note (Signed)
>   3 min Discussed the risks and costs (both direct and indirect)  of smoking relative to the benefits of quitting but patient unwilling to commit at this point to a specific quit date.       

## 2017-05-24 NOTE — Assessment & Plan Note (Signed)
-   PFTs  11/18/10  FEV1  2.0 (95%) ratio 73 and no change p saba and DLCO 75 corrects to 100% -PFT's  03/08/2015  FEV1 1.85 (78 % ) ratio 73  p 5 % improvement from saba with DLCO  63 % corrects to 72 % for alv volume and erv 17   - added flutter 06/07/2015  - PFT's  08/16/2016  FEV1 1.42 (61 % ) ratio 71  p 8 % improvement FEV1 (and 20% FVC) from saba p symb 160  prior to study with DLCO  61/58 % corrects to 78 % for alv volume    - Allergy profile 08/16/2016 >  Eos 0.5 /  IgE  99 RAST neg - prednisone x 6 days cycles 11/20/2016  - 05/23/2017  After extensive coaching HFA effectiveness =    90%  Despite active smoking and poor insight into meds, doing reasonably well on symbicort when she takes it as intended.  Reviewed the rule of two's for here: If your breathing worsens or you need to use your rescue inhaler more than twice weekly or wake up more than twice a month with any respiratory symptoms or require more than two rescue inhalers per year, we need to see you right away because this means we're not controlling the underlying problem (inflammation) adequately.  Rescue inhalers (albuterol in any form but especially neb) do not control inflammation and overuse can lead to unnecessary and costly consequences.  They can make you feel better temporarily but eventually they will quit working effectively much as sleep aids lead to more insomnia if used regularly.   I had an extended discussion with the patient reviewing all relevant studies completed to date and  lasting 15 to 20 minutes of a 25 minute visit    Each maintenance medication was reviewed in detail including most importantly the difference between maintenance and prns and under what circumstances the prns are to be triggered using an action plan format that is not reflected in the computer generated alphabetically organized AVS.    Please see AVS for specific instructions unique to this visit that I personally wrote and verbalized to the the pt  in detail and then reviewed with pt  by my nurse highlighting any  changes in therapy recommended at today's visit to their plan of care.

## 2017-06-05 ENCOUNTER — Other Ambulatory Visit: Payer: Self-pay | Admitting: Nurse Practitioner

## 2017-06-05 DIAGNOSIS — J449 Chronic obstructive pulmonary disease, unspecified: Secondary | ICD-10-CM

## 2017-06-12 ENCOUNTER — Other Ambulatory Visit: Payer: Self-pay | Admitting: Nurse Practitioner

## 2017-06-18 ENCOUNTER — Ambulatory Visit: Payer: Medicare Other | Admitting: Nurse Practitioner

## 2017-06-18 ENCOUNTER — Encounter: Payer: Self-pay | Admitting: Nurse Practitioner

## 2017-06-18 VITALS — BP 128/82 | HR 68 | Temp 98.1°F | Ht 65.0 in | Wt 166.8 lb

## 2017-06-18 DIAGNOSIS — Q046 Congenital cerebral cysts: Secondary | ICD-10-CM | POA: Diagnosis not present

## 2017-06-18 DIAGNOSIS — F331 Major depressive disorder, recurrent, moderate: Secondary | ICD-10-CM | POA: Diagnosis not present

## 2017-06-18 DIAGNOSIS — N3281 Overactive bladder: Secondary | ICD-10-CM

## 2017-06-18 DIAGNOSIS — I10 Essential (primary) hypertension: Secondary | ICD-10-CM | POA: Diagnosis not present

## 2017-06-18 DIAGNOSIS — J449 Chronic obstructive pulmonary disease, unspecified: Secondary | ICD-10-CM | POA: Diagnosis not present

## 2017-06-18 NOTE — Progress Notes (Signed)
Careteam: Patient Care Team: Lauree Chandler, NP as PCP - General (Geriatric Medicine)  Advanced Directive information    Allergies  Allergen Reactions  . Cafergot   . Codeine   . Fenoprofen Calcium   . Nalfon [Fenoprofen]     Chief Complaint  Patient presents with  . Follow-up    Pt is being seen for a 2 month follow up visit on mood and OAB.      HPI: Patient is a 72 y.o. female seen in the office today to follow up on mood and overactive bladder.  Depression- did not wish to continue Wellbutrin, reports mood has been stable. No worsening depression or anxiety, no SI or HI. Does not feel like she has major depression and does not wish to be on additional medication.   Did not feel like myrbetriq was effective. Leaks a lot. No blood in urine.  Continues to have urinary frequency occasionally.   Headache to right side of head lasting about 5 mins sometimes are intense, others not so much, hx of migraines but these are different, previously following with [facility] Lose due to colloid cyst of the third ventricle, has not followed up since last CT head in 2017, plans to follow up with neurospinal specialist for this and headache.   htn- blood pressure at goal  COPD- coughing more lately.   Review of Systems:  Review of Systems  Constitutional: Negative for chills, fever and weight loss.  HENT: Negative for sore throat and tinnitus.   Respiratory: Positive for cough and wheezing. Negative for sputum production and shortness of breath.        Baseline pulmonary status  Cardiovascular: Negative for chest pain, palpitations and leg swelling.  Gastrointestinal: Negative for abdominal pain, constipation, diarrhea and heartburn.  Genitourinary: Negative for dysuria, frequency and urgency.  Musculoskeletal: Positive for back pain (back ache occasionally) and joint pain. Negative for falls and myalgias.  Skin: Negative.   Neurological: Positive for headaches. Negative  for dizziness, tingling, sensory change and speech change.  Psychiatric/Behavioral: Positive for depression. Negative for memory loss. The patient does not have insomnia.     Past Medical History:  Diagnosis Date  . Chronic airway obstruction, not elsewhere classified   . Depressive disorder   . Depressive disorder, not elsewhere classified   . Emphysema   . Enthesopathy of ankle and tarsus, unspecified   . HTN (hypertension)   . Hyperlipidemia   . Hypertension   . Hypopotassemia   . Leukocytosis   . Migraine, unspecified, without mention of intractable migraine without mention of status migrainosus   . Nonspecific (abnormal) findings on radiological and other examination of skull and head   . Osteoarthrosis, unspecified whether generalized or localized, unspecified site   . Other emphysema ([place])   . Other specified disease of white blood cells   . Palpitations   . Tobacco abuse    Past Surgical History:  Procedure Laterality Date  . CESAREAN SECTION    . COLONOSCOPY  08/14/2008   Dr.. Delfin Edis   Social History:   reports that she has been smoking cigarettes.  She has a 28.00 pack-year smoking history. she has never used smokeless tobacco. She reports that she does not drink alcohol or use drugs.  Family History  Problem Relation Age of Onset  . Alzheimer's disease Father   . Heart disease Father   . Diabetes Father   . Skin cancer Father   . Heart disease Mother   .  Breast cancer Mother     Medications: Patient's Medications  New Prescriptions   No medications on file  Previous Medications   ALBUTEROL (PROAIR HFA) 108 (90 BASE) MCG/ACT INHALER    INHALE 1 TO 2 PUFFS INTO THE LUNGS EVERY 6 HOURS AS NEEDED FOR WHEEZING OR SHORTNESS OF BREATH   ALPRAZOLAM (XANAX) 0.5 MG TABLET    TAKE 1 TABLET BY MOUTH UP TO TWICE DAILY FOR NERVOUSNESS OR SLEEP   ATORVASTATIN (LIPITOR) 40 MG TABLET    TAKE 1 TABLET BY MOUTH DAILY FOR HIGH CHOLESTEROL   BUDESONIDE-FORMOTEROL  (SYMBICORT) 160-4.5 MCG/ACT INHALER    Inhale 2 puffs by mouth first thing in the morning and 2 puffs about 12 hours later   CHOLECALCIFEROL (VITAMIN D3) 1000 UNITS CAPS    Take 1 capsule by mouth daily.     LEVOTHYROXINE (SYNTHROID, LEVOTHROID) 112 MCG TABLET    Take 1 tablet (112 mcg total) by mouth daily.   LOSARTAN (COZAAR) 50 MG TABLET    TAKE 1 TABLET BY MOUTH ONCE DAILY FOR BLOOD PRESSURE   PREDNISONE (DELTASONE) 10 MG TABLET    Take 10 mg by mouth daily as needed.   SERTRALINE (ZOLOFT) 100 MG TABLET    TAKE 1 TABLET BY MOUTH DAILY  Modified Medications   No medications on file  Discontinued Medications   IPRATROPIUM-ALBUTEROL (DUONEB) 0.5-2.5 (3) MG/3ML SOLN    Take 3 mLs by nebulization every 4 (four) hours as needed.   PREDNISONE (STERAPRED UNI-PAK 21 TAB) 10 MG (21) TBPK TABLET    Prednisone 10 mg take  4 each am x 2 days,   2 each am x 2 days,  1 each am x 2 days and stop     Physical Exam:  Vitals:   06/18/17 1516  BP: 128/82  Pulse: 68  Temp: 98.1 F (36.7 C)  SpO2: 96%  Weight: 166 lb 12.8 oz (75.7 kg)  Height: 5\' 5"  (1.651 m)   Body mass index is 27.76 kg/m.  Physical Exam  Constitutional: She is oriented to person, place, and time. She appears well-developed and well-nourished. No distress.  HENT:  Head: Normocephalic and atraumatic.  Eyes: Conjunctivae and EOM are normal. Pupils are equal, round, and reactive to light. Left eye exhibits no discharge.  Corrective lenses.  Neck: Normal range of motion. Neck supple.  Cardiovascular: Normal rate, regular rhythm and normal heart sounds. Exam reveals no gallop and no friction rub.  No murmur heard. Pulmonary/Chest: Effort normal and breath sounds normal. She has no wheezes. She has no rales.  Abdominal: Soft. Bowel sounds are normal.  Musculoskeletal: Normal range of motion. She exhibits no edema or tenderness.  Neurological: She is alert and oriented to person, place, and time. No cranial nerve deficit.  Coordination normal.  Skin: Skin is warm and dry. No rash noted. She is not diaphoretic. No erythema. No pallor.  Psychiatric: She has a normal mood and affect. Her behavior is normal. Thought content normal.    Labs reviewed: Basic Metabolic Panel: Recent Labs    07/31/16 0809 03/19/17 0947  NA 141 138  K 4.5 4.5  CL 102 100  CO2 31 32  GLUCOSE 93 94  BUN 14 17  CREATININE 0.88 0.84  CALCIUM 9.2 9.3  TSH 8.95* 4.38   Liver Function Tests: Recent Labs    03/19/17 0947  AST 16  ALT 17  BILITOT 0.6  PROT 6.5   No results for input(s): LIPASE, AMYLASE in the last 8760 hours.  No results for input(s): AMMONIA in the last 8760 hours. CBC: Recent Labs    08/16/16 1712 03/19/17 0947  WBC 10.4 12.5*  NEUTROABS 5.1 7,350  HGB 15.4* 16.1*  HCT 46.2* 48.4*  MCV 92.7 91.0  PLT 264.0 300   Lipid Panel: Recent Labs    03/19/17 0947  CHOL 176  HDL 75  LDLCALC 74  TRIG 172*  CHOLHDL 2.3   TSH: Recent Labs    07/31/16 0809 03/19/17 0947  TSH 8.95* 4.38   A1C: Lab Results  Component Value Date   HGBA1C 5.9 (H) 06/17/2013     Assessment/Plan 1. Overactive bladder Unchanged, did not feel like myrbetriq was effective does not wish to try additional medication.   2. Moderate episode of recurrent major depressive disorder (HCC) -stable, continues on zoloft.   3. Cyst, colloid, third ventricle ([place]) -follow up with neurologist specialist for follow up.   4. Asthmatic bronchitis , chronic (HCC) -remains stable, will continue current regimen, following with pulmonary  To continue mucinex DM by mouth twice daily with full glass of water for cough and congestion.  Zyrtec 10 mg daily  5. Essential hypertension -blood pressure stable, will cont current regimen   Stone Spirito K. [place], [facility] [phone]

## 2017-06-18 NOTE — Patient Instructions (Addendum)
Dr. Jairo Ben Neurologist in [place], [place] Address: [address], [place], [place] [address] Phone: [phone]   mucinex DM by mouth twice daily with full glass of water for cough and congestion Zyrtec 10 mg daily- allergies

## 2017-07-13 ENCOUNTER — Encounter: Payer: Self-pay | Admitting: Nurse Practitioner

## 2017-07-13 ENCOUNTER — Ambulatory Visit: Payer: Medicare Other | Admitting: Nurse Practitioner

## 2017-07-13 VITALS — BP 124/78 | HR 73 | Temp 98.7°F | Ht 65.0 in | Wt 164.0 lb

## 2017-07-13 DIAGNOSIS — L03317 Cellulitis of buttock: Secondary | ICD-10-CM | POA: Diagnosis not present

## 2017-07-13 DIAGNOSIS — W57XXXA Bitten or stung by nonvenomous insect and other nonvenomous arthropods, initial encounter: Secondary | ICD-10-CM

## 2017-07-13 MED ORDER — DOXYCYCLINE HYCLATE 100 MG PO TABS: 100.0000 mg | ORAL_TABLET | Freq: Two times a day (BID) | ORAL | 0 refills | Status: DC

## 2017-07-13 NOTE — Patient Instructions (Signed)
Start doxycyline 100 mg by mouth twice daily due tick bite Notify if area becomes larger, painful, warm or with drainage

## 2017-07-13 NOTE — Progress Notes (Addendum)
Careteam: Patient Care Team: Lauree Chandler, NP as PCP - General (Geriatric Medicine)  Advanced Directive information    Allergies  Allergen Reactions  . Cafergot   . Codeine   . Fenoprofen Calcium   . Nalfon [Fenoprofen]     Chief Complaint  Patient presents with  . Acute Visit    Pt is being seen due to a tick bite on right buttock. Tick was found attached 4 days ago but unsure of how long it had been there.      HPI: Patient is a 72 y.o. female seen in the office today due to tick bite on her buttocks. Pt with hx of RMSF and  Hx of multi-tick bites so wanted this evaluated.  Found tick 4 days ago but unsure how long it has been on. Area is red and hard but does not hurt. Has had cellulitis the last 2 years due to tick bite   Review of Systems:  Review of Systems  Constitutional: Negative for chills, fever and malaise/fatigue.  Musculoskeletal: Negative for myalgias.  Skin: Positive for itching.       Red area that is itchy from tick bite on right buttocks  Neurological: Positive for headaches (this morning but has resolved). Negative for dizziness and tremors.    Past Medical History:  Diagnosis Date  . Chronic airway obstruction, not elsewhere classified   . Depressive disorder   . Depressive disorder, not elsewhere classified   . Emphysema   . Enthesopathy of ankle and tarsus, unspecified   . HTN (hypertension)   . Hyperlipidemia   . Hypertension   . Hypopotassemia   . Leukocytosis   . Migraine, unspecified, without mention of intractable migraine without mention of status migrainosus   . Nonspecific (abnormal) findings on radiological and other examination of skull and head   . Osteoarthrosis, unspecified whether generalized or localized, unspecified site   . Other emphysema ([place])   . Other specified disease of white blood cells   . Palpitations   . Tobacco abuse    Past Surgical History:  Procedure Laterality Date  . CESAREAN SECTION    .  COLONOSCOPY  08/14/2008   Dr.. Delfin Edis   Social History:   reports that she has been smoking cigarettes.  She has a 28.00 pack-year smoking history. She has never used smokeless tobacco. She reports that she does not drink alcohol or use drugs.  Family History  Problem Relation Age of Onset  . Alzheimer's disease Father   . Heart disease Father   . Diabetes Father   . Skin cancer Father   . Heart disease Mother   . Breast cancer Mother     Medications: Patient's Medications  New Prescriptions   No medications on file  Previous Medications   ALBUTEROL (PROAIR HFA) 108 (90 BASE) MCG/ACT INHALER    INHALE 1 TO 2 PUFFS INTO THE LUNGS EVERY 6 HOURS AS NEEDED FOR WHEEZING OR SHORTNESS OF BREATH   ALPRAZOLAM (XANAX) 0.5 MG TABLET    TAKE 1 TABLET BY MOUTH UP TO TWICE DAILY FOR NERVOUSNESS OR SLEEP   ATORVASTATIN (LIPITOR) 40 MG TABLET    TAKE 1 TABLET BY MOUTH DAILY FOR HIGH CHOLESTEROL   BUDESONIDE-FORMOTEROL (SYMBICORT) 160-4.5 MCG/ACT INHALER    Inhale 2 puffs by mouth first thing in the morning and 2 puffs about 12 hours later   CHOLECALCIFEROL (VITAMIN D3) 1000 UNITS CAPS    Take 1 capsule by mouth daily.  LEVOTHYROXINE (SYNTHROID, LEVOTHROID) 112 MCG TABLET    Take 1 tablet (112 mcg total) by mouth daily.   LOSARTAN (COZAAR) 50 MG TABLET    TAKE 1 TABLET BY MOUTH ONCE DAILY FOR BLOOD PRESSURE   PREDNISONE (DELTASONE) 10 MG TABLET    Take 10 mg by mouth daily as needed.   SERTRALINE (ZOLOFT) 100 MG TABLET    TAKE 1 TABLET BY MOUTH DAILY  Modified Medications   No medications on file  Discontinued Medications   No medications on file     Physical Exam:  Vitals:   07/13/17 1109  Pulse: 73  Temp: 98.7 F (37.1 C)  TempSrc: Oral  SpO2: 96%  Weight: 164 lb (74.4 kg)  Height: 5\' 5"  (1.651 m)   Body mass index is 27.29 kg/m.  Physical Exam  Constitutional: She is oriented to person, place, and time. She appears well-developed and well-nourished. No distress.  HENT:    Head: Normocephalic and atraumatic.  Eyes: Pupils are equal, round, and reactive to light. Conjunctivae and EOM are normal. Left eye exhibits no discharge.  Corrective lenses.  Neck: Normal range of motion. Neck supple.  Cardiovascular: Normal rate, regular rhythm and normal heart sounds. Exam reveals no gallop and no friction rub.  No murmur heard. Pulmonary/Chest: Effort normal. She has rales (throughout).  Musculoskeletal: Normal range of motion. She exhibits no edema or tenderness.  Neurological: She is alert and oriented to person, place, and time. No cranial nerve deficit. Coordination normal.  Skin: Skin is warm and dry. She is not diaphoretic. No pallor.       Labs reviewed: Basic Metabolic Panel: Recent Labs    07/31/16 0809 03/19/17 0947  NA 141 138  K 4.5 4.5  CL 102 100  CO2 31 32  GLUCOSE 93 94  BUN 14 17  CREATININE 0.88 0.84  CALCIUM 9.2 9.3  TSH 8.95* 4.38   Liver Function Tests: Recent Labs    03/19/17 0947  AST 16  ALT 17  BILITOT 0.6  PROT 6.5   No results for input(s): LIPASE, AMYLASE in the last 8760 hours. No results for input(s): AMMONIA in the last 8760 hours. CBC: Recent Labs    08/16/16 1712 03/19/17 0947  WBC 10.4 12.5*  NEUTROABS 5.1 7,350  HGB 15.4* 16.1*  HCT 46.2* 48.4*  MCV 92.7 91.0  PLT 264.0 300   Lipid Panel: Recent Labs    03/19/17 0947  CHOL 176  HDL 75  LDLCALC 74  TRIG 172*  CHOLHDL 2.3   TSH: Recent Labs    07/31/16 0809 03/19/17 0947  TSH 8.95* 4.38   A1C: Lab Results  Component Value Date   HGBA1C 5.9 (H) 06/17/2013     Assessment/Plan 1. Tick bite, initial encounter 2. Cellulitis  -gets tick bites about once yearly that require treatment due to redness that progress to cellulitis. Will go ahead and treat with doxycycline for 1 week to notify if area worsens.  - doxycycline (VIBRA-TABS) 100 MG tablet; Take 1 tablet (100 mg total) by mouth 2 (two) times daily.  Dispense: 14 tablet; Refill:  0  Next appt: 5 months with Dr Eulas Post, 11 months with Myself for routine follow up [place]. [place], [facility] [phone]

## 2017-07-15 ENCOUNTER — Emergency Department (HOSPITAL_COMMUNITY): Payer: Medicare Other

## 2017-07-15 ENCOUNTER — Emergency Department (HOSPITAL_COMMUNITY)
Admission: EM | Admit: 2017-07-15 | Discharge: 2017-07-15 | Disposition: A | Discharge status: Home / Self Care | Payer: Medicare Other | Attending: Emergency Medicine | Admitting: Emergency Medicine

## 2017-07-15 DIAGNOSIS — Y929 Unspecified place or not applicable: Secondary | ICD-10-CM | POA: Diagnosis not present

## 2017-07-15 DIAGNOSIS — S52572A Other intraarticular fracture of lower end of left radius, initial encounter for closed fracture: Secondary | ICD-10-CM | POA: Diagnosis not present

## 2017-07-15 DIAGNOSIS — Z79899 Other long term (current) drug therapy: Secondary | ICD-10-CM | POA: Diagnosis not present

## 2017-07-15 DIAGNOSIS — Y998 Other external cause status: Secondary | ICD-10-CM | POA: Diagnosis not present

## 2017-07-15 DIAGNOSIS — Y9389 Activity, other specified: Secondary | ICD-10-CM | POA: Diagnosis not present

## 2017-07-15 DIAGNOSIS — S5290XA Unspecified fracture of unspecified forearm, initial encounter for closed fracture: Secondary | ICD-10-CM | POA: Insufficient documentation

## 2017-07-15 DIAGNOSIS — S5292XA Unspecified fracture of left forearm, initial encounter for closed fracture: Secondary | ICD-10-CM | POA: Diagnosis present

## 2017-07-15 DIAGNOSIS — W010XXA Fall on same level from slipping, tripping and stumbling without subsequent striking against object, initial encounter: Secondary | ICD-10-CM | POA: Insufficient documentation

## 2017-07-15 DIAGNOSIS — I10 Essential (primary) hypertension: Secondary | ICD-10-CM | POA: Diagnosis not present

## 2017-07-15 DIAGNOSIS — E039 Hypothyroidism, unspecified: Secondary | ICD-10-CM | POA: Diagnosis not present

## 2017-07-15 DIAGNOSIS — F17210 Nicotine dependence, cigarettes, uncomplicated: Secondary | ICD-10-CM | POA: Insufficient documentation

## 2017-07-15 MED ORDER — HYDROCODONE-ACETAMINOPHEN 5-325 MG PO TABS: 1.0000 | ORAL_TABLET | Freq: Four times a day (QID) | ORAL | 0 refills | Status: DC | PRN

## 2017-07-15 MED ORDER — ETOMIDATE 2 MG/ML IV SOLN
8.0000 mg | Freq: Once | INTRAVENOUS | Status: AC
  Administered 2017-07-15: 8 mg via INTRAVENOUS
  Filled 2017-07-15: qty 10

## 2017-07-15 MED ORDER — ONDANSETRON HCL 4 MG PO TABS: 4.0000 mg | ORAL_TABLET | Freq: Four times a day (QID) | ORAL | 0 refills | Status: DC

## 2017-07-15 MED ORDER — ONDANSETRON 4 MG PO TBDP
4.0000 mg | ORAL_TABLET | Freq: Once | ORAL | Status: AC | PRN
  Administered 2017-07-15: 4 mg via ORAL
  Filled 2017-07-15: qty 1

## 2017-07-15 MED ORDER — BUPIVACAINE HCL (PF) 0.5 % IJ SOLN
20.0000 mL | Freq: Once | INTRAMUSCULAR | Status: AC
  Administered 2017-07-15: 20 mL
  Filled 2017-07-15: qty 20

## 2017-07-15 MED ORDER — ONDANSETRON HCL 4 MG/2ML IJ SOLN
4.0000 mg | Freq: Once | INTRAMUSCULAR | Status: AC
  Administered 2017-07-15: 4 mg via INTRAVENOUS
  Filled 2017-07-15: qty 2

## 2017-07-15 MED ORDER — HYDROCODONE-ACETAMINOPHEN 5-325 MG PO TABS
1.0000 | ORAL_TABLET | Freq: Once | ORAL | Status: AC
  Administered 2017-07-15: 1 via ORAL
  Filled 2017-07-15: qty 1

## 2017-07-15 MED ORDER — FENTANYL CITRATE (PF) 100 MCG/2ML IJ SOLN
50.0000 ug | Freq: Once | INTRAMUSCULAR | Status: DC
  Filled 2017-07-15: qty 2

## 2017-07-15 NOTE — ED Notes (Addendum)
Time Out performed for closed reduction of wrist fracture. Patient hooked up to 2L O2 [place], etCO2, and placed on cardiac monitor. Ortho tech, MD, PA-C, RN, and EMT at bedside.

## 2017-07-15 NOTE — Discharge Instructions (Addendum)
Go to Dr. Angus Palms office for an appointment at 64 PM tomorrow I would arrive 15 minutes early.  Return here as needed.

## 2017-07-15 NOTE — ED Provider Notes (Signed)
.  Sedation Date/Time: 07/15/2017 7:28 PM Performed by: Margette Fast, MD Authorized by: Margette Fast, MD   Consent:    Consent obtained:  Written   Consent given by:  Patient   Risks discussed:  Allergic reaction, dysrhythmia, inadequate sedation, nausea, prolonged hypoxia resulting in organ damage, prolonged sedation necessitating reversal, respiratory compromise necessitating ventilatory assistance and intubation and vomiting   Alternatives discussed:  Analgesia without sedation Universal protocol:    Immediately prior to procedure a time out was called: yes     Patient identity confirmation method:  Anonymous protocol, patient vented/unresponsive and arm band Indications:    Procedure performed:  Fracture reduction   Procedure necessitating sedation performed by:  Physician performing sedation   Intended level of sedation:  Moderate (conscious sedation) Pre-sedation assessment:    Time since last food or drink:  4 hours   ASA classification: class 1 - normal, healthy patient     Mallampati score:  I - soft palate, uvula, fauces, pillars visible   Pre-sedation assessments completed and reviewed: airway patency, cardiovascular function, hydration status, mental status, nausea/vomiting, pain level, respiratory function and temperature   Immediate pre-procedure details:    Reassessment: Patient reassessed immediately prior to procedure     Reviewed: vital signs, relevant labs/tests and NPO status     Verified: bag valve mask available, emergency equipment available, intubation equipment available, IV patency confirmed and oxygen available   Procedure details (see MAR for exact dosages):    Preoxygenation:  Nasal cannula   Sedation:  Etomidate   Analgesia:  Fentanyl   Intra-procedure monitoring:  Blood pressure monitoring, cardiac monitor, continuous pulse oximetry, continuous capnometry, frequent LOC assessments and frequent vital sign checks   Intra-procedure events: none     Total  Provider sedation time (minutes):  30 Post-procedure details:    Attendance: Constant attendance by certified staff until patient recovered     Recovery: Patient returned to pre-procedure baseline     Post-sedation assessments completed and reviewed: airway patency, cardiovascular function, hydration status, mental status, nausea/vomiting, pain level, respiratory function and temperature     Patient is stable for discharge or admission: no     Patient tolerance:  Tolerated well, no immediate complications .Ortho Injury Treatment Date/Time: 07/15/2017 7:30 PM Performed by: Margette Fast, MD Authorized by: Margette Fast, MD   Consent:    Consent obtained:  Written   Consent given by:  Patient   Risks discussed:  Fracture   Alternatives discussed:  ImmobilizationInjury location: wrist Location details: left wrist Injury type: fracture Fracture type: distal radius Pre-procedure neurovascular assessment: neurovascularly intact Pre-procedure distal perfusion: normal Pre-procedure neurological function: normal Pre-procedure range of motion: reduced Anesthesia: hematoma block  Anesthesia: Local anesthesia used: yes Local Anesthetic: bupivacaine 0.25% without epinephrine Anesthetic total: 10 mL  Patient sedated: Yes. Refer to sedation procedure documentation for details of sedation. Manipulation performed: yes Skin traction used: yes Reduction successful: yes X-ray confirmed reduction: yes Immobilization: splint Splint type: sugar tong Supplies used: Ortho-Glass Post-procedure neurovascular assessment: post-procedure neurovascularly intact Post-procedure distal perfusion: normal Post-procedure neurological function: normal Post-procedure range of motion: unchanged Patient tolerance: Patient tolerated the procedure well with no immediate complications    Nanda Quinton, MD   Margette Fast, MD 07/15/17 [phone]

## 2017-07-15 NOTE — Progress Notes (Signed)
Orthopedic Tech Progress Note Patient Details:  Gabrielle White July 01, 1945 [id number]  Ortho Devices Type of Ortho Device: Ace wrap, Arm sling, Sugartong splint Ortho Device/Splint Location: LUE Ortho Device/Splint Interventions: Ordered, Application   Post Interventions Patient Tolerated: Well Instructions Provided: Care of device   Braulio Bosch 07/15/2017, 4:08 PM

## 2017-07-15 NOTE — ED Provider Notes (Signed)
[place] [place] EMERGENCY DEPARTMENT Provider Note   CSN: [id number] Arrival date & time: 07/15/17  1222     History   Chief Complaint No chief complaint on file.   HPI Gabrielle White is a 72 y.o. female.  HPI Patient presents to the emergency department with a left wrist injury following a fall that occurred earlier today.  The patient states that she tripped over her shoes.  She states that she landed with her wrist extended.  She states that movements palpation made the pain worse.  She states she would like any medications prior to arrival.  Patient states that she has no other injuries at this time.  Patient states that she is still has sensation and feeling in her hand. Past Medical History:  Diagnosis Date  . Chronic airway obstruction, not elsewhere classified   . Depressive disorder   . Depressive disorder, not elsewhere classified   . Emphysema   . Enthesopathy of ankle and tarsus, unspecified   . HTN (hypertension)   . Hyperlipidemia   . Hypertension   . Hypopotassemia   . Leukocytosis   . Migraine, unspecified, without mention of intractable migraine without mention of status migrainosus   . Nonspecific (abnormal) findings on radiological and other examination of skull and head   . Osteoarthrosis, unspecified whether generalized or localized, unspecified site   . Other emphysema ([place])   . Other specified disease of white blood cells   . Palpitations   . Tobacco abuse     Patient Active Problem List   Diagnosis Date Noted  . Abnormal CXR 11/21/2016  . Skin pustule 08/17/2016  . Ganglion cyst of wrist 05/23/2016  . DOE (dyspnea on exertion) 03/21/2016  . Absence attack ([place]) 10/06/2015  . RUQ pain 10/06/2015  . RMSF [facility] spotted fever) 08/23/2015  . Screening mammogram for high-risk patient 11/24/2014  . Depression 10/28/2014  . Balance problem 07/07/2014  . Thrush 02/17/2014  . Claustrophobia 12/17/2013  . Urinary incontinence  10/29/2013  . Chest pain, unspecified 10/07/2013  . Essential hypertension 10/07/2013  . Anxiety state 01/08/2013  . Palpitations 10/22/2012  . Pain in finger of left hand 08/21/2012  . Hyperlipidemia 08/20/2012  . Hypothyroidism 08/20/2012  . Malaise 08/20/2012  . Asthmatic bronchitis , chronic ([place]) 02/03/2011  . Chronic cough 02/03/2011  . Hyperglycemia 11/18/2010  . Cigarette smoker 11/10/2010    Past Surgical History:  Procedure Laterality Date  . CESAREAN SECTION    . COLONOSCOPY  08/14/2008   DrMarland Kitchen Delfin Edis     OB History   None      Home Medications    Prior to Admission medications   Medication Sig Start Date End Date Taking? Authorizing Provider  albuterol (PROAIR HFA) 108 (90 Base) MCG/ACT inhaler INHALE 1 TO 2 PUFFS INTO THE LUNGS EVERY 6 HOURS AS NEEDED FOR WHEEZING OR SHORTNESS OF BREATH 06/05/17  Yes Lauree Chandler, NP  ALPRAZolam (XANAX) 0.5 MG tablet TAKE 1 TABLET BY MOUTH UP TO TWICE DAILY FOR NERVOUSNESS OR SLEEP 04/16/17  Yes Lauree Chandler, NP  atorvastatin (LIPITOR) 40 MG tablet TAKE 1 TABLET BY MOUTH DAILY FOR HIGH CHOLESTEROL 06/12/17  Yes Lauree Chandler, NP  budesonide-formoterol (SYMBICORT) 160-4.5 MCG/ACT inhaler Inhale 2 puffs by mouth first thing in the morning and 2 puffs about 12 hours later 03/22/17  Yes Lauree Chandler, NP  Cholecalciferol (VITAMIN D3) 1000 UNITS CAPS Take 1 capsule by mouth daily.     Yes [provider]  doxycycline (VIBRA-TABS) 100 MG tablet Take 1 tablet (100 mg total) by mouth 2 (two) times daily. 07/13/17  Yes Lauree Chandler, NP  levothyroxine (SYNTHROID, LEVOTHROID) 112 MCG tablet Take 1 tablet (112 mcg total) by mouth daily. 08/02/16  Yes Estill Dooms, MD  losartan (COZAAR) 50 MG tablet TAKE 1 TABLET BY MOUTH ONCE DAILY FOR BLOOD PRESSURE 03/22/17  Yes Lauree Chandler, NP  predniSONE (DELTASONE) 10 MG tablet Take 10 mg by mouth daily as needed.   Yes [provider]  sertraline  (ZOLOFT) 100 MG tablet TAKE 1 TABLET BY MOUTH DAILY 06/12/17  Yes Lauree Chandler, NP    Family History Family History  Problem Relation Age of Onset  . Alzheimer's disease Father   . Heart disease Father   . Diabetes Father   . Skin cancer Father   . Heart disease Mother   . Breast cancer Mother     Social History Social History   Tobacco Use  . Smoking status: Current Every Day Smoker    Packs/day: 1.00    Years: 28.00    Pack years: 28.00    Types: Cigarettes  . Smokeless tobacco: Never Used  Substance Use Topics  . Alcohol use: No    Alcohol/week: 0.0 oz  . Drug use: No     Allergies   Cafergot; Codeine; Fenoprofen calcium; and Nalfon [fenoprofen]   Review of Systems Review of Systems All other systems negative except as documented in the HPI. All pertinent positives and negatives as reviewed in the HPI.  Physical Exam Updated Vital Signs BP (!) 149/70 (BP Location: Right Arm)   Pulse 72   Resp 20   SpO2 96%   Physical Exam  Constitutional: She is oriented to person, place, and time. She appears well-developed and well-nourished. No distress.  HENT:  Head: Normocephalic and atraumatic.  Eyes: Pupils are equal, round, and reactive to light.  Neck: Normal range of motion. Neck supple.  Cardiovascular: Normal rate, regular rhythm and normal heart sounds. Exam reveals no gallop and no friction rub.  No murmur heard. Pulmonary/Chest: Effort normal.  Musculoskeletal:       Left wrist: She exhibits decreased range of motion, tenderness, bony tenderness, swelling and deformity.       Arms: Neurological: She is alert and oriented to person, place, and time. She exhibits normal muscle tone. Coordination normal.  Skin: Skin is warm and dry. Capillary refill takes less than 2 seconds. No rash noted. No erythema.  Psychiatric: She has a normal mood and affect. Her behavior is normal.  Nursing note and vitals reviewed.    ED Treatments / Results  Labs (all  labs ordered are listed, but only abnormal results are displayed) Labs Reviewed - No data to display  EKG None  Radiology Dg Forearm Left  Result Date: 07/15/2017 CLINICAL DATA:  Fall, wrist pain. EXAM: LEFT FOREARM - 2 VIEW COMPARISON:  None. FINDINGS: Displaced/comminuted fracture of the distal LEFT radius, with complete posterior displacement of the distal fragment and overriding of the main fracture fragments. Additional displaced/comminuted fracture of the distal LEFT ulna, with similar complete posterior displacement of the distal fracture fragment and overriding of the main fracture fragments. Proximal radius and ulna are intact and normally aligned. IMPRESSION: 1. Displaced/comminuted fracture of the distal LEFT radius, with marked/complete posterior displacement of the distal fracture fragment and overriding of the main fracture fragments. 2. Also displaced/comminuted fracture of the distal LEFT ulna, with similar marked/complete posterior  displacement of the distal fracture fragment and overriding of the main fracture fragments. Electronically Signed   By: Franki Cabot M.D.   On: 07/15/2017 13:34   Dg Wrist Complete Left  Result Date: 07/15/2017 CLINICAL DATA:  Fall, wrist pain. EXAM: LEFT WRIST - COMPLETE 3+ VIEW COMPARISON:  None. FINDINGS: Displaced/comminuted fracture of the distal LEFT radius, with complete posterior displacement of the distal fragment and overriding of the main fracture fragments. Additional displaced/comminuted fracture of the distal LEFT ulna, with similar complete posterior displacement of the distal fracture fragment and overriding of the main fracture fragments. Carpal bones appear intact and normally aligned. Degenerative changes noted at the first [facility] joint, moderate in degree with associated articular surface sclerosis and spurring. IMPRESSION: 1. Displaced/comminuted fracture of the distal LEFT radius, with marked/complete posterior displacement of the distal  fracture fragment and overriding of the main fracture fragments. 2. Also displaced/comminuted fracture of the distal LEFT ulna, with similar marked/complete posterior displacement of the distal fracture fragment and overriding of the main fracture fragments. 3. Carpal bones appear intact and normally aligned. Electronically Signed   By: Franki Cabot M.D.   On: 07/15/2017 13:36    Procedures Procedures (including critical care time)  Medications Ordered in ED Medications  fentaNYL (SUBLIMAZE) injection 50 mcg (0 mcg Intravenous Hold 07/15/17 1603)  HYDROcodone-acetaminophen (NORCO/VICODIN) 5-325 MG per tablet 1 tablet (1 tablet Oral Given 07/15/17 1438)  bupivacaine (MARCAINE) 0.5 % injection 20 mL (20 mLs Infiltration Given by Other 07/15/17 1456)  ondansetron (ZOFRAN-ODT) disintegrating tablet 4 mg (4 mg Oral Given 07/15/17 1444)  etomidate (AMIDATE) injection 8 mg (8 mg Intravenous Given 07/15/17 1555)  ondansetron (ZOFRAN) injection 4 mg (4 mg Intravenous Given 07/15/17 1603)     Initial Impression / Assessment and Plan / ED Course  I have reviewed the triage vital signs and the nursing notes.  Pertinent labs & imaging results that were available during my care of the patient were reviewed by me and considered in my medical decision making (see chart for details).     Attempting to contact hand surgery about the patient's findings of her distal radius and ulnar fractures.  Dr. Laverta Baltimore consciously sedated the patient and reduce the fracture fragments.  She was placed in a sugar tong splint postreduction.  Dr. Caralyn Guile will see the patient tomorrow at 12 PM.  Final Clinical Impressions(s) / ED Diagnoses   Final diagnoses:  Fracture closed of lower end of forearm    ED Discharge Orders    None       Dalia Heading, Hershal Coria 07/15/17 1634    Long, Wonda Olds, MD 07/15/17 [phone]

## 2017-07-15 NOTE — ED Triage Notes (Signed)
Per [facility]: Patient to ED for L wrist pain d/t mechanical fall this morning. Obvious deformity per [employer]. Who placed splint on wrist. Pulses and sensation intact and equal bilaterally, decreased ROM L fingers according to patient. No other injuries noted. Patient denies head/neck pain. EMS VS: 152/90, P 72, RR 18, 93% RA.

## 2017-07-15 NOTE — ED Triage Notes (Signed)
Patient complains of left forearm/wrist pain after slip and fall. No loc. Deformity noted to same, positive distal pulse. Arrived with splint and ice applied on arival

## 2017-07-15 NOTE — ED Notes (Signed)
ED Provider at bedside reducing wrist

## 2017-08-02 NOTE — Progress Notes (Signed)
Order(s) created erroneously. Erroneous order ID: [id number]  Order moved by: Genia Harold D  Order move date/time: 08/02/2017 2:13 PM  Source Patient: [id number]  Source Contact: 08/02/2017  Destination Patient: [id number]  Destination Contact: 09/01/2016

## 2017-08-20 ENCOUNTER — Other Ambulatory Visit: Payer: Self-pay | Admitting: Nurse Practitioner

## 2017-08-20 DIAGNOSIS — J449 Chronic obstructive pulmonary disease, unspecified: Secondary | ICD-10-CM

## 2017-08-21 ENCOUNTER — Ambulatory Visit: Payer: Medicare Other | Admitting: Internal Medicine

## 2017-08-24 ENCOUNTER — Telehealth: Payer: Self-pay

## 2017-08-24 DIAGNOSIS — W57XXXA Bitten or stung by nonvenomous insect and other nonvenomous arthropods, initial encounter: Secondary | ICD-10-CM

## 2017-08-24 MED ORDER — DOXYCYCLINE HYCLATE 100 MG PO TABS: 100.0000 mg | ORAL_TABLET | Freq: Two times a day (BID) | ORAL | 0 refills | Status: DC

## 2017-08-24 NOTE — Telephone Encounter (Signed)
Rx was sent to pharmacy. Patient was driving when I called her to schedule appt and she stated that she would call back later today to schedule appt next week.

## 2017-08-24 NOTE — Telephone Encounter (Signed)
Okay to refill 4 more days of Doxycycline, try to see the patient in the office 08/28/17, thank you

## 2017-08-24 NOTE — Telephone Encounter (Signed)
Patient called to request a refill on doxycycline 100 mg due to 2 additional tick bites that are red and itching. Pt was put on doxycycline on 07/13/17 due to a tick bite. Pt has history of cellulitis and other reactions to tick bites, so she would like to know if a refill can please be called in for her.   There are no available appointments for patient to be seen in office today. Please advise if refill is ok?

## 2017-08-29 ENCOUNTER — Ambulatory Visit: Payer: Medicare Other | Admitting: Internal Medicine

## 2017-09-05 ENCOUNTER — Other Ambulatory Visit: Payer: Self-pay | Admitting: Internal Medicine

## 2017-09-05 DIAGNOSIS — E038 Other specified hypothyroidism: Secondary | ICD-10-CM

## 2017-09-11 ENCOUNTER — Other Ambulatory Visit: Payer: Self-pay | Admitting: Nurse Practitioner

## 2017-09-11 ENCOUNTER — Other Ambulatory Visit: Payer: Self-pay | Admitting: Internal Medicine

## 2017-09-11 DIAGNOSIS — J449 Chronic obstructive pulmonary disease, unspecified: Secondary | ICD-10-CM

## 2017-09-12 NOTE — Telephone Encounter (Signed)
[place] Database verified and compliance confirmed   

## 2017-09-21 ENCOUNTER — Ambulatory Visit: Payer: Medicare Other | Admitting: Internal Medicine

## 2017-10-06 ENCOUNTER — Other Ambulatory Visit: Payer: Self-pay | Admitting: Nurse Practitioner

## 2017-10-06 DIAGNOSIS — E038 Other specified hypothyroidism: Secondary | ICD-10-CM

## 2017-10-10 ENCOUNTER — Other Ambulatory Visit: Payer: Self-pay | Admitting: Neurosurgery

## 2017-10-10 DIAGNOSIS — Q046 Congenital cerebral cysts: Secondary | ICD-10-CM

## 2017-10-15 ENCOUNTER — Other Ambulatory Visit: Payer: Medicare Other

## 2017-10-17 ENCOUNTER — Ambulatory Visit
Admission: RE | Admit: 2017-10-17 | Discharge: 2017-10-17 | Disposition: A | Discharge status: Home / Self Care | Payer: Medicare Other | Source: Ambulatory Visit | Attending: Neurosurgery | Admitting: Neurosurgery

## 2017-10-17 DIAGNOSIS — Q046 Congenital cerebral cysts: Secondary | ICD-10-CM

## 2017-10-29 ENCOUNTER — Other Ambulatory Visit: Payer: Self-pay | Admitting: Nurse Practitioner

## 2017-10-30 NOTE — Telephone Encounter (Signed)
A medication refill was received from pharmacy for alprazolam  0.5 mg. Rx was called in to pharmacy after verifying last fill date, provider, and quantity on [place] database.   

## 2017-11-06 ENCOUNTER — Other Ambulatory Visit: Payer: Self-pay | Admitting: Nurse Practitioner

## 2017-11-06 DIAGNOSIS — I10 Essential (primary) hypertension: Secondary | ICD-10-CM

## 2017-11-28 ENCOUNTER — Encounter: Payer: Self-pay | Admitting: Internal Medicine

## 2017-11-28 ENCOUNTER — Ambulatory Visit (INDEPENDENT_AMBULATORY_CARE_PROVIDER_SITE_OTHER): Payer: Medicare Other | Admitting: Internal Medicine

## 2017-11-28 ENCOUNTER — Ambulatory Visit: Payer: Medicare Other | Admitting: Internal Medicine

## 2017-11-28 VITALS — BP 116/74 | HR 72 | Ht 64.25 in | Wt 159.0 lb

## 2017-11-28 DIAGNOSIS — J449 Chronic obstructive pulmonary disease, unspecified: Secondary | ICD-10-CM

## 2017-11-28 DIAGNOSIS — F17210 Nicotine dependence, cigarettes, uncomplicated: Secondary | ICD-10-CM

## 2017-11-28 LAB — PULMONARY FUNCTION TEST
DL/VA % PRED: 81 %
DL/VA: 3.92 ml/min/mmHg/L
DLCO unc % pred: 61 %
DLCO unc: 15.08 ml/min/mmHg
FEF 25-75 POST: 0.96 L/s
FEF 25-75 Pre: 0.84 L/sec
FEF2575-%CHANGE-POST: 13 %
FEF2575-%PRED-POST: 54 %
FEF2575-%PRED-PRE: 47 %
FEV1-%CHANGE-POST: 6 %
FEV1-%PRED-PRE: 54 %
FEV1-%Pred-Post: 57 %
FEV1-PRE: 1.19 L
FEV1-Post: 1.27 L
FEV1FVC-%CHANGE-POST: -8 %
FEV1FVC-%PRED-PRE: 90 %
FEV6-%Change-Post: 14 %
FEV6-%Pred-Post: 72 %
FEV6-%Pred-Pre: 63 %
FEV6-POST: 2 L
FEV6-Pre: 1.75 L
FEV6FVC-%Change-Post: -1 %
FEV6FVC-%Pred-Post: 103 %
FEV6FVC-%Pred-Pre: 105 %
FVC-%CHANGE-POST: 15 %
FVC-%PRED-POST: 69 %
FVC-%PRED-PRE: 60 %
FVC-POST: 2.02 L
FVC-PRE: 1.75 L
POST FEV1/FVC RATIO: 63 %
PRE FEV1/FVC RATIO: 68 %
PRE FEV6/FVC RATIO: 100 %
Post FEV6/FVC ratio: 99 %
RV % pred: 145 %
RV: 3.3 L
TLC % PRED: 103 %
TLC: 5.28 L

## 2017-11-28 MED ORDER — ALBUTEROL SULFATE HFA 108 (90 BASE) MCG/ACT IN AERS: INHALATION_SPRAY | RESPIRATORY_TRACT | 3 refills | Status: DC

## 2017-11-28 MED ORDER — PREDNISONE 10 MG PO TABS: 10.0000 mg | ORAL_TABLET | Freq: Every day | ORAL | 11 refills | Status: DC | PRN

## 2017-11-28 MED ORDER — BUDESONIDE-FORMOTEROL FUMARATE 160-4.5 MCG/ACT IN AERO: INHALATION_SPRAY | RESPIRATORY_TRACT | 11 refills | Status: DC

## 2017-11-28 NOTE — Patient Instructions (Addendum)
Plan A = Automatic/ no matter  = symbicort 160 Take 2 puffs first thing in am and then another 2 puffs about 12 hours later.   Work on Engineer, technical sales technique:  relax and gently blow all the way out then take a nice smooth deep breath back in, triggering the inhaler at same time you start breathing in.  Hold for up to 5 seconds if you can. Blow out thru nose. Rinse and gargle with water when done      Plan B = Backup Only use your albuterol (Proair) as a rescue medication to be used if you can't catch your breath by resting or doing a relaxed purse lip breathing pattern.  - The less you use it, the better it will work when you need it. - Ok to use the inhaler up to 2 puffs  every 4 hours if you must but call for appointment if use goes up over your usual need - Don't leave home without it !!  (think of it like the spare tire for your car)   Plan C = Crisis - only use your albuterol nebulizer if you first try Plan B and it fails to help > ok to use the nebulizer up to every 4 hours but if start needing it regularly call for immediate appointment   Plan D = Deltasone (prednisone) - Prednisone 10 mg take  4 each am x 2 days,   2 each am x 2 days,  1 each am x 2 days and stop    The key is to stop smoking completely before smoking completely stops you!      Please schedule a follow up visit in 3 months but call sooner if needed  with all medications /inhalers/ solutions in hand so we can verify exactly what you are taking. This includes all medications from all doctors and over the counters

## 2017-11-28 NOTE — Progress Notes (Signed)
Subjective:   Patient ID: Gabrielle White, female    DOB: Aug 20, 1945 .   MRN: [id number]    Brief patient profile:  66   yowf active smoker with AB/GOLD 0 copd by pfts 03/08/15 and GOLD II criteria 11/28/2017     History of Present Illness  01/22/2015 acute extended re-establish ov/Gabrielle White re:  AB/ still smoking  Chief Complaint  Patient presents with  . Pulmonary Consult    pt last seen in 2014. pt states DR. Green wanted her to follow up. pt states somethines her throat feels like it closes up and she cant breath. pt states she feels like she has to take really deep breaths. pt c/o chest tightness, and prod cough white in color.pt c/o thrush she thiks may come from the inhailers.     Last better breathing  x 4 weeks prior to OV  p using prednisone but benefit only for a few days p stopped despite maint rx with symbicort (though hfa poor)  Nl routine is symbicort 160 2 bid and maybe ventolin and occ brovana no recent duoneb need  In retrospect really hasn't been able to really stay well x 4 y Cough is harsh and congested worse day than noct  rec Start Prednisone 10mg  Take 4 for two days three for two days two for two days one for two days  Plan A =  Automatic = symbicort 160 Take 2 puffs first thing in am and then another 2 puffs about 12 hours later.  Work on Interior and spatial [employer]: Plan B = Backup  Only use your albuterol  Plan C = crisis  Only use nebulizer iprotropium-albuterol (duoneb) if you try the ventolin first and it fails to help > ok to use up to every 4 hours  Plan D = doctor call us if not improving  Plan E = ER > go there if all else fails Try prilosec otc 20mg   Take 30-60 min before first meal of the day and Pepcid ac (famotidine) 20 mg one @  bedtime    GERD (REFLUX) diet        11/28/2017  f/u ov/Gabrielle White re: GOLD II on symb 160 2bid/ trouble affording meds but still smoking  Chief Complaint  Patient presents with  . Follow-up    PFT's done today. Her  breathing is unchanged. She uses her albuterol inhaler 2-3 x per day on average.    Dyspnea:  MMRC1 = can walk nl pace, flat grade, can't hurry or go uphills or steps s sob   Cough: after supper but not sleeping Sleeping: ok mostly flat  SABA use: as above, problems proair 02: none     No obvious day to day or daytime variability or assoc excess/ purulent sputum or mucus plugs or hemoptysis or cp or chest tightness, subjective wheeze or overt sinus or hb symptoms.   Sleeping  without nocturnal  or early am exacerbation  of respiratory  c/o's or need for noct saba. Also denies any obvious fluctuation of symptoms with weather or environmental changes or other aggravating or alleviating factors except as outlined above   No unusual exposure hx or h/o childhood pna/ asthma or knowledge of premature birth.  Current Allergies, Complete Past Medical History, Past Surgical History, Family History, and Social History were reviewed in [employer] [employer] record.  ROS  The following are not active complaints unless bolded Hoarseness, sore throat, dysphagia, dental problems, itching, sneezing,  nasal congestion or discharge of excess  mucus or purulent secretions, ear ache,   fever, chills, sweats, unintended wt loss or wt gain, classically pleuritic or exertional cp,  orthopnea pnd or arm/hand swelling  or leg swelling, presyncope, palpitations, abdominal pain, anorexia, nausea, vomiting, diarrhea  or change in bowel habits or change in bladder habits, change in stools or change in urine, dysuria, hematuria,  rash, arthralgias, visual complaints, headache, numbness, weakness or ataxia or problems with walking or coordination,  change in mood or  memory.        Current Meds  Medication Sig  . albuterol (PROAIR HFA) 108 (90 Base) MCG/ACT inhaler INHALE 1 TO 2 PUFFS INTO THE LUNGS EVERY 6 HOURS AS NEEDED FOR WHEEZING OR SHORTNESS OF BREATH  . ALPRAZolam (XANAX) 0.5 MG tablet TAKE 1 TABLET BY  MOUTH TWICE DAILY AS NEEDED FOR NERVOUSNESS/SLEEP  . atorvastatin (LIPITOR) 40 MG tablet TAKE 1 TABLET BY MOUTH DAILY FOR HIGH CHOLESTEROL  . budesonide-formoterol (SYMBICORT) 160-4.5 MCG/ACT inhaler Take 2 puffs first thing in am and then another 2 puffs about 12 hours later.  . Cholecalciferol (VITAMIN D3) 1000 UNITS CAPS Take 1 capsule by mouth daily.    Marland Kitchen levothyroxine (SYNTHROID, LEVOTHROID) 112 MCG tablet TAKE 1 TABLET(112 MCG) BY MOUTH DAILY  . losartan (COZAAR) 50 MG tablet TAKE 1 TABLET BY MOUTH EVERY DAY FOR BLOOD PRESSURE  . ondansetron (ZOFRAN) 4 MG tablet Take 1 tablet (4 mg total) by mouth every 6 (six) hours.  . predniSONE (DELTASONE) 10 MG tablet Take 1 tablet (10 mg total) by mouth daily as needed.  . sertraline (ZOLOFT) 100 MG tablet TAKE 1 TABLET BY MOUTH DAILY  . [DISCONTINUED] albuterol (PROAIR HFA) 108 (90 Base) MCG/ACT inhaler INHALE 1 TO 2 PUFFS INTO THE LUNGS EVERY 6 HOURS AS NEEDED FOR WHEEZING OR SHORTNESS OF BREATH  . [DISCONTINUED] budesonide-formoterol (SYMBICORT) 160-4.5 MCG/ACT inhaler Inhale 2 puffs by mouth first thing in the morning and 2 puffs about 12 hours later  . [DISCONTINUED] predniSONE (DELTASONE) 10 MG tablet Take 10 mg by mouth daily as needed.  . [DISCONTINUED] SYMBICORT 160-4.5 MCG/ACT inhaler INHALE 2 PUFFS BY MOUTH FIRST THING IN THE MORNING AND 2 PUFFS ABOUT 12 HOURS LATER                Objective:   Physical Exam   amb wf nad   Vital signs reviewed - Note on arrival 02 sats  94% on RA     01/22/2015      156 > 03/08/2015   162 > 06/07/2015  170 > 03/20/2016 160 > 05/15/2016   162 >   08/16/2016 163 > 11/20/2016  161 > 12/12/2016  162 > 05/23/2017  159 > 11/28/2017  159     01/16/13 156 lb (70.761 kg)  01/08/13 155 lb 3.2 oz (70.398 kg)  12/20/12 157 lb 9.6 oz (71.487 kg)          HEENT: nl dentition, turbinates bilaterally, and oropharynx. Nl external ear canals without cough reflex   NECK :  without JVD/Nodes/TM/ nl carotid  upstrokes bilaterally   LUNGS: no acc muscle use,  Nl contour chest with mid exp rhonchi  bilaterally without cough on insp or exp maneuvers   CV:  RRR  no s3 or murmur or increase in P2, and no edema   ABD:  soft and nontender with nl inspiratory excursion in the supine position. No bruits or organomegaly appreciated, bowel sounds nl  MS:  Nl gait/ ext warm without deformities, calf tenderness, cyanosis  or clubbing No obvious joint restrictions   SKIN: warm and dry without lesions    NEURO:  alert, approp, nl sensorium with  no motor or cerebellar deficits apparent.                 Assessment & Plan:

## 2017-11-28 NOTE — Progress Notes (Signed)
PFT done today. 

## 2017-11-29 ENCOUNTER — Encounter: Payer: Self-pay | Admitting: Internal Medicine

## 2017-11-29 NOTE — Assessment & Plan Note (Signed)
-   PFTs  11/18/10  FEV1  2.0 (95%) ratio 73 and no change p saba and DLCO 75 corrects to 100% -PFT's  03/08/2015  FEV1 1.85 (78 % ) ratio 73  p 5 % improvement from saba with DLCO  63 % corrects to 72 % for alv volume and erv 17   - added flutter 06/07/2015  - PFT's  08/16/2016  FEV1 1.42 (61 % ) ratio 71  p 8 % improvement FEV1 (and 20% FVC) from saba p symb 160  prior to study with DLCO  61/58 % corrects to 78 % for alv volume    - Allergy profile 08/16/2016 >  Eos 0.5 /  IgE  99 RAST neg - prednisone x 6 days cycles 11/20/2016  - 05/23/2017  After extensive coaching HFA effectiveness =    90%  - PFT's  11/28/2017  FEV1 1.27 (57 % ) ratio 63  p 6 % improvement from saba p symb x 160 x 2  prior to study with DLCO  61 % corrects to 81  % for alv volume     Ironically still able to purchase cigs but struggling to purchase meds to offset the side effects of cigs  Formulary restrictions will be an ongoing challenge for the forseable future and I would be happy to pick an alternative if the pt will first  provide me a list of them but pt  will need to return here for training for any new device that is required eg dpi vs hfa vs respimat.    In meantime we can always provide samples so the patient never runs out of any needed respiratory medications.   For now symbicort looks like the best bet but could try bevespi or add spiriva if not happy with symptoms control   - The proper method of use, as well as anticipated side effects, of a metered-dose inhaler are discussed and demonstrated to the patient. Improved effectiveness after extensive coaching during this visit to a level of approximately 90 % from a baseline of 90 %   I had an extended discussion with the patient reviewing all relevant studies completed to date and  lasting 15 to 20 minutes of a 25 minute visit    See device teaching which extended face to face time for this visit.  Each maintenance medication was reviewed in detail including  emphasizing most importantly the difference between maintenance and prns and under what circumstances the prns are to be triggered using an action plan format that is not reflected in the computer generated alphabetically organized AVS which I have not found useful in most complex patients, especially with respiratory illnesses  Please see AVS for specific instructions unique to this visit that I personally wrote and verbalized to the the pt in detail and then reviewed with pt  by my nurse highlighting any  changes in therapy recommended at today's visit to their plan of care.

## 2017-11-29 NOTE — Assessment & Plan Note (Signed)
4-5 min discussion re active cigarette smoking in addition to office E&M  Ask about tobacco use:   ongoing Advise quitting    I reviewed the Fletcher curve with the patient that basically indicates  if you quit smoking when your best day FEV1 is still  preserved (as is still relatively true here)  it is highly unlikely you will progress to severe disease and informed the patient there was  no medication on the market that has proven to alter the curve/ its downward trajectory  or the likelihood of progression of their disease(unlike other chronic medical conditions such as atheroclerosis where we do think we can change the natural hx with risk reducing meds)    Therefore stopping smoking and maintaining abstinence are  the most important aspects of care, not choice of inhalers or for that matter, doctors.    Assess willingness:  Not committed at this point Assist in quit attempt:  Per PCP when ready Arrange follow up:   Follow up per Primary Care planned

## 2017-12-04 ENCOUNTER — Other Ambulatory Visit: Payer: Self-pay | Admitting: Nurse Practitioner

## 2017-12-18 ENCOUNTER — Ambulatory Visit: Payer: Medicare Other | Admitting: Internal Medicine

## 2018-01-03 ENCOUNTER — Encounter: Payer: Self-pay | Admitting: Nurse Practitioner

## 2018-01-03 ENCOUNTER — Ambulatory Visit: Payer: Medicare Other | Admitting: Nurse Practitioner

## 2018-01-03 VITALS — BP 122/70 | HR 66 | Temp 98.2°F | Ht 64.0 in | Wt 165.3 lb

## 2018-01-03 DIAGNOSIS — E2839 Other primary ovarian failure: Secondary | ICD-10-CM

## 2018-01-03 DIAGNOSIS — I10 Essential (primary) hypertension: Secondary | ICD-10-CM

## 2018-01-03 DIAGNOSIS — F17210 Nicotine dependence, cigarettes, uncomplicated: Secondary | ICD-10-CM | POA: Diagnosis not present

## 2018-01-03 DIAGNOSIS — B372 Candidiasis of skin and nail: Secondary | ICD-10-CM

## 2018-01-03 DIAGNOSIS — F331 Major depressive disorder, recurrent, moderate: Secondary | ICD-10-CM

## 2018-01-03 DIAGNOSIS — J449 Chronic obstructive pulmonary disease, unspecified: Secondary | ICD-10-CM

## 2018-01-03 DIAGNOSIS — M810 Age-related osteoporosis without current pathological fracture: Secondary | ICD-10-CM

## 2018-01-03 DIAGNOSIS — M545 Low back pain, unspecified: Secondary | ICD-10-CM

## 2018-01-03 DIAGNOSIS — E039 Hypothyroidism, unspecified: Secondary | ICD-10-CM

## 2018-01-03 DIAGNOSIS — E782 Mixed hyperlipidemia: Secondary | ICD-10-CM

## 2018-01-03 MED ORDER — NYSTATIN 100000 UNIT/GM EX POWD: Freq: Three times a day (TID) | CUTANEOUS | 0 refills | Status: DC

## 2018-01-03 NOTE — Progress Notes (Signed)
Careteam: Patient Care Team: Lauree Chandler, NP as PCP - General (Geriatric Medicine)  Advanced Directive information Does Patient Have a Medical Advance Directive?: No  Allergies  Allergen Reactions  . Cafergot   . Codeine   . Fenoprofen Calcium   . Nalfon [Fenoprofen]     Chief Complaint  Patient presents with  . Medical Management of Chronic Issues    Pt is being seen for 5 month routine visit. pt has been having back pain, toe numbness, fatigue, swollen ankles. pt also has a rash under breasts and an insect bite on left knee.      HPI: Patient is a 72 y.o. female seen in the office today for routine follow up.   Fell and scattered wrist in April had to have surgery.   Redness under breast bilaterally  Back has been hurting, feels like she has arthritis. 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  No numbness or tingling down her leg. No loss of bowel or bladder, no altered gait.  Back only hurts occasionally. Pain does not last.   Continues to smoke cigarettes 1/2-1 ppd   Review of Systems:  Review of Systems  Constitutional: Negative for chills, fever and weight loss.  HENT: Negative for sore throat and tinnitus.   Respiratory: Positive for cough and wheezing. Negative for sputum production and shortness of breath.        Baseline pulmonary status  Cardiovascular: Negative for chest pain, palpitations and leg swelling.  Gastrointestinal: Negative for abdominal pain, constipation, diarrhea and heartburn.  Genitourinary: Negative for dysuria, frequency and urgency.  Musculoskeletal: Positive for back pain (back ache occasionally) and joint pain. Negative for falls and myalgias.  Skin: Negative.   Neurological: Positive for headaches. Negative for dizziness, tingling, sensory change and speech change.  Psychiatric/Behavioral: Positive for depression. Negative for memory loss. The patient does not have insomnia.     Past Medical  History:  Diagnosis Date  . Chronic airway obstruction, not elsewhere classified   . Depressive disorder   . Depressive disorder, not elsewhere classified   . Emphysema   . Enthesopathy of ankle and tarsus, unspecified   . HTN (hypertension)   . Hyperlipidemia   . Hypertension   . Hypopotassemia   . Leukocytosis   . Migraine, unspecified, without mention of intractable migraine without mention of status migrainosus   . Nonspecific (abnormal) findings on radiological and other examination of skull and head   . Osteoarthrosis, unspecified whether generalized or localized, unspecified site   . Other emphysema ([place])   . Other specified disease of white blood cells   . Palpitations   . Tobacco abuse    Past Surgical History:  Procedure Laterality Date  . CESAREAN SECTION    . COLONOSCOPY  08/14/2008   Dr.. Delfin Edis   Social History:   reports that she has been smoking cigarettes. She has a 28.00 pack-year smoking history. She has never used smokeless tobacco. She reports that she does not drink alcohol or use drugs.  Family History  Problem Relation Age of Onset  . Alzheimer's disease Father   . Heart disease Father   . Diabetes Father   . Skin cancer Father   . Heart disease Mother   . Breast cancer Mother     Medications: Patient's Medications  New Prescriptions   No medications on file  Previous Medications   ALBUTEROL (PROAIR HFA) 108 (90 BASE) MCG/ACT INHALER    INHALE 1  TO 2 PUFFS INTO THE LUNGS EVERY 6 HOURS AS NEEDED FOR WHEEZING OR SHORTNESS OF BREATH   ALPRAZOLAM (XANAX) 0.5 MG TABLET    TAKE 1 TABLET BY MOUTH TWICE DAILY AS NEEDED FOR NERVOUSNESS/SLEEP   ATORVASTATIN (LIPITOR) 40 MG TABLET    TAKE 1 TABLET BY MOUTH DAILY FOR HIGH CHOLESTEROL   BUDESONIDE-FORMOTEROL (SYMBICORT) 160-4.5 MCG/ACT INHALER    Take 2 puffs first thing in am and then another 2 puffs about 12 hours later.   CHOLECALCIFEROL (VITAMIN D3) 1000 UNITS CAPS    Take 1 capsule by mouth daily.      LEVOTHYROXINE (SYNTHROID, LEVOTHROID) 112 MCG TABLET    TAKE 1 TABLET(112 MCG) BY MOUTH DAILY   LOSARTAN (COZAAR) 50 MG TABLET    TAKE 1 TABLET BY MOUTH EVERY DAY FOR BLOOD PRESSURE   PREDNISONE (DELTASONE) 10 MG TABLET    Take 1 tablet (10 mg total) by mouth daily as needed.   SERTRALINE (ZOLOFT) 100 MG TABLET    TAKE 1 TABLET BY MOUTH DAILY  Modified Medications   No medications on file  Discontinued Medications   ONDANSETRON (ZOFRAN) 4 MG TABLET    Take 1 tablet (4 mg total) by mouth every 6 (six) hours.     Physical Exam:  Vitals:   01/03/18 1524  BP: 122/70  Pulse: 66  Temp: 98.2 F (36.8 C)  TempSrc: Oral  SpO2: 96%  Weight: 165 lb 4.8 oz (75 kg)  Height: 5\' 4"  (1.626 m)   Body mass index is 28.37 kg/m.  Physical Exam  Constitutional: She is oriented to person, place, and time. She appears well-developed and well-nourished. No distress.  HENT:  Head: Normocephalic and atraumatic.  Eyes: Pupils are equal, round, and reactive to light. Conjunctivae and EOM are normal. Left eye exhibits no discharge.  Corrective lenses.  Neck: Normal range of motion. Neck supple.  Cardiovascular: Normal rate, regular rhythm and normal heart sounds. Exam reveals no gallop and no friction rub.  No murmur heard. Pulmonary/Chest: Effort normal and breath sounds normal. She has no wheezes. She has no rales.  Abdominal: Soft. Bowel sounds are normal.  Musculoskeletal: Normal range of motion. She exhibits no edema or tenderness.  Neurological: She is alert and oriented to person, place, and time. No cranial nerve deficit. Coordination normal.  Skin: Skin is warm and dry. Rash (yeast noted under breast) noted. She is not diaphoretic. No erythema. No pallor.  Psychiatric: She has a normal mood and affect. Her behavior is normal. Thought content normal.    Labs reviewed: Basic Metabolic Panel: Recent Labs    03/19/17 0947  NA 138  K 4.5  CL 100  CO2 32  GLUCOSE 94  BUN 17    CREATININE 0.84  CALCIUM 9.3  TSH 4.38   Liver Function Tests: Recent Labs    03/19/17 0947  AST 16  ALT 17  BILITOT 0.6  PROT 6.5   No results for input(s): LIPASE, AMYLASE in the last 8760 hours. No results for input(s): AMMONIA in the last 8760 hours. CBC: Recent Labs    03/19/17 0947  WBC 12.5*  NEUTROABS 7,350  HGB 16.1*  HCT 48.4*  MCV 91.0  PLT 300   Lipid Panel: Recent Labs    03/19/17 0947  CHOL 176  HDL 75  LDLCALC 74  TRIG 172*  CHOLHDL 2.3   TSH: Recent Labs    03/19/17 0947  TSH 4.38   A1C: Lab Results  Component Value Date  HGBA1C 5.9 (H) 06/17/2013     Assessment/Plan 1. Yeast infection of the skin -keep area clean and dry, to apply nystatin TID until resolves then can use PRN - nystatin (MYCOSTATIN/NYSTOP) powder; Apply topically 3 (three) times daily.  Dispense: 15 g; Refill: 0  2. Acute midline low back pain without sciatica Occasional back pain noted without pain down leg, may use tylenol as needed.   3. Cigarette smoker Encouraged cessation   4. Essential hypertension Controlled on current regimen  - COMPLETE METABOLIC PANEL WITH GFR; Future - CBC with Differential/Platelets; Future  5. Asthmatic bronchitis , chronic (HCC) Stable, encouraged smoking cessation. Continues to follow up with pulmonary. Continues on symbicort and albuterol PRN  6. Hypothyroidism, unspecified type -continues on synthroid  - TSH; Future  7. Moderate episode of recurrent major depressive disorder (HCC) Controlled on zoloft daily  8. Estrogen deficiency -bone density   9. Osteoporosis without current pathological fracture, unspecified osteoporosis type -recent wrist fracture, will follow up dexa scan - DG Bone Density; Future  10. Mixed hyperlipidemia -continues on lipitor, LDL 74 on last lab.  - Lipid Panel; Future  Next appt: 3 months  Jessica K. [place], [facility] [phone]

## 2018-01-03 NOTE — Patient Instructions (Signed)
Nystatin powder under breast 2-3 times daily for yeast  To make lab appt for next week for FASTING blood work  dexa scan scheduled to evaluate bone density   Follow up in 3 months with DR Mariea Clonts

## 2018-01-07 ENCOUNTER — Other Ambulatory Visit: Payer: Medicare Other

## 2018-01-07 DIAGNOSIS — E782 Mixed hyperlipidemia: Secondary | ICD-10-CM

## 2018-01-07 DIAGNOSIS — E039 Hypothyroidism, unspecified: Secondary | ICD-10-CM

## 2018-01-07 DIAGNOSIS — I10 Essential (primary) hypertension: Secondary | ICD-10-CM

## 2018-01-08 LAB — LIPID PANEL
Cholesterol: 144 mg/dL (ref <200)
HDL: 50 mg/dL — AB (ref >50)
LDL Cholesterol (Calc): 72 mg/dL (calc)
Non-HDL Cholesterol (Calc): 94 mg/dL (calc) (ref <130)
TRIGLYCERIDES: 134 mg/dL (ref <150)
Total CHOL/HDL Ratio: 2.9 (calc) (ref <5.0)

## 2018-01-08 LAB — COMPLETE METABOLIC PANEL WITH GFR
AG Ratio: 1.7 (calc) (ref 1.0–2.5)
ALBUMIN MSPROF: 3.9 g/dL (ref 3.6–5.1)
ALT: 14 U/L (ref 6–29)
AST: 14 U/L (ref 10–35)
Alkaline phosphatase (APISO): 89 U/L (ref 33–130)
BILIRUBIN TOTAL: 0.5 mg/dL (ref 0.2–1.2)
BUN: 9 mg/dL (ref 7–25)
CO2: 32 mmol/L (ref 20–32)
CREATININE: 0.79 mg/dL (ref 0.60–0.93)
Calcium: 8.8 mg/dL (ref 8.6–10.4)
Chloride: 103 mmol/L (ref 98–110)
GFR, Est African American: 86 mL/min/{1.73_m2} (ref >=60)
GFR, Est Non African American: 74 mL/min/{1.73_m2} (ref >=60)
GLUCOSE: 86 mg/dL (ref 65–99)
Globulin: 2.3 g/dL (calc) (ref 1.9–3.7)
Potassium: 4.2 mmol/L (ref 3.5–5.3)
SODIUM: 141 mmol/L (ref 135–146)
TOTAL PROTEIN: 6.2 g/dL (ref 6.1–8.1)

## 2018-01-08 LAB — TSH: TSH: 4.53 mIU/L — ABNORMAL HIGH (ref 0.40–4.50)

## 2018-01-08 LAB — CBC WITH DIFFERENTIAL/PLATELET
BASOS ABS: 50 {cells}/uL (ref 0–200)
Basophils Relative: 0.5 %
Eosinophils Absolute: 485 cells/uL (ref 15–500)
Eosinophils Relative: 4.9 %
HEMATOCRIT: 43.6 % (ref 35.0–45.0)
Hemoglobin: 14.4 g/dL (ref 11.7–15.5)
LYMPHS ABS: 2307 {cells}/uL (ref 850–3900)
MCH: 30.4 pg (ref 27.0–33.0)
MCHC: 33 g/dL (ref 32.0–36.0)
MCV: 92 fL (ref 80.0–100.0)
MPV: 11.3 fL (ref 7.5–12.5)
Monocytes Relative: 6.4 %
NEUTROS PCT: 64.9 %
Neutro Abs: 6425 cells/uL (ref 1500–7800)
PLATELETS: 250 10*3/uL (ref 140–400)
RBC: 4.74 10*6/uL (ref 3.80–5.10)
RDW: 12.7 % (ref 11.0–15.0)
TOTAL LYMPHOCYTE: 23.3 %
WBC mixed population: 634 cells/uL (ref 200–950)
WBC: 9.9 10*3/uL (ref 3.8–10.8)

## 2018-01-14 ENCOUNTER — Telehealth: Payer: Self-pay | Admitting: *Deleted

## 2018-01-14 NOTE — Telephone Encounter (Signed)
Unable to call in additional medication at this time. This is a 3 month supply so will not be able to refill until time, insurance also will not pay for additional bill.  We probably need to decrease to #60 with next refill when it is time as well. Okay to use melatonin 3-6 mg by mouth daily for sleep

## 2018-01-14 NOTE — Telephone Encounter (Signed)
Patient notified and agreed.  

## 2018-01-14 NOTE — Telephone Encounter (Signed)
Patient called and stated that she received a bottle of Alprazolam yesterday #180 and she left the lid off of it and spilled them all over the floor. Patient stated that she had dogs and cats and she is not going to pick them up off the floor and take them. Patient wants to know what you suggest. Wonders if she could take Melatonin until next refill or if another Rx could be called into [facility]. Or if there is something else you could prescribe. Please Advise.

## 2018-01-16 NOTE — Telephone Encounter (Signed)
Has she tried anything else in the past for sleep? Melatonin can take a few days to work. We could try Ambien 5 mg by mouth daily? Also can recommend cognitive behavioral therapy for sleep or referral to the sleep center.

## 2018-01-16 NOTE — Telephone Encounter (Signed)
Patient called and stated that the Melatonin 5mg  is not working. Patient stated that she is getting only about 2 hours of sleep a night. Stated that she goes back to work tomorrow and needs something that's going to work to help her rest. Patient is begging for something different. Please Advise.

## 2018-01-16 NOTE — Telephone Encounter (Signed)
Another option would be trazodone 25-50 mg by mouth qhs as needed sleep. If she wants to discuss other options we can always have her come in for an office visit.

## 2018-01-16 NOTE — Telephone Encounter (Signed)
Patient notified. Patient stated that she IS NOT going to take the Ambien because she has heard horror stories about it. Stated that she has one Alprazolam left from an old Rx that she is going to take tonight. Will continue to take the Melatonin.  Stated that she cannot worry about anymore bills right now with a referral. Stated she can't pay more on bills right now.

## 2018-01-17 NOTE — Telephone Encounter (Signed)
LMOM to return call.

## 2018-01-22 ENCOUNTER — Other Ambulatory Visit: Payer: Self-pay | Admitting: *Deleted

## 2018-02-02 ENCOUNTER — Other Ambulatory Visit: Payer: Self-pay | Admitting: Nurse Practitioner

## 2018-02-02 DIAGNOSIS — J449 Chronic obstructive pulmonary disease, unspecified: Secondary | ICD-10-CM

## 2018-03-04 ENCOUNTER — Other Ambulatory Visit: Payer: Self-pay | Admitting: Nurse Practitioner

## 2018-03-04 ENCOUNTER — Ambulatory Visit: Payer: Medicare Other | Admitting: Internal Medicine

## 2018-03-04 DIAGNOSIS — Z1231 Encounter for screening mammogram for malignant neoplasm of breast: Secondary | ICD-10-CM

## 2018-03-04 DIAGNOSIS — I10 Essential (primary) hypertension: Secondary | ICD-10-CM

## 2018-03-11 ENCOUNTER — Encounter: Payer: Self-pay | Admitting: Internal Medicine

## 2018-03-11 ENCOUNTER — Ambulatory Visit: Payer: Medicare Other | Admitting: Internal Medicine

## 2018-03-11 VITALS — BP 140/86 | HR 81 | Ht 64.0 in | Wt 164.0 lb

## 2018-03-11 DIAGNOSIS — J449 Chronic obstructive pulmonary disease, unspecified: Secondary | ICD-10-CM

## 2018-03-11 DIAGNOSIS — F17210 Nicotine dependence, cigarettes, uncomplicated: Secondary | ICD-10-CM | POA: Diagnosis not present

## 2018-03-11 DIAGNOSIS — J310 Chronic rhinitis: Secondary | ICD-10-CM | POA: Diagnosis not present

## 2018-03-11 NOTE — Patient Instructions (Addendum)
For drainage / throat tickle try take CHLORPHENIRAMINE  4 mg (chlortab at [employer])  - take one every 4 hours as needed - available over the counter- may cause drowsiness so start with just a bedtime dose or two and see how you tolerate it before trying in daytime     Only use your albuterol as a rescue medication to be used if you can't catch your breath by resting or doing a relaxed purse lip breathing pattern.  - The less you use it, the better it will work when you need it. - Ok to use up to 2 puffs  every 4 hours if you must but call for immediate appointment if use goes up over your usual need - Don't leave home without it !!  (think of it like the spare tire for your car)     Please schedule a follow up visit in 3 months but call sooner if needed  - needs alpha screening next ov

## 2018-03-11 NOTE — Progress Notes (Signed)
Subjective:   Patient ID: Gabrielle White, female    DOB: 19-Jul-1945 .   MRN: [id number]    Brief patient profile:  63  yowf active smoker with AB/GOLD 0 copd by pfts 03/08/15 and GOLD II criteria 11/28/2017     History of Present Illness  01/22/2015 acute extended re-establish ov/Gabrielle White re:  AB/ still smoking  Chief Complaint  Patient presents with  . Pulmonary Consult    pt last seen in 2014. pt states DR. Green wanted her to follow up. pt states somethines her throat feels like it closes up and she cant breath. pt states she feels like she has to take really deep breaths. pt c/o chest tightness, and prod cough white in color.pt c/o thrush she thiks may come from the inhailers.     Last better breathing  x 4 weeks prior to OV  p using prednisone but benefit only for a few days p stopped despite maint rx with symbicort (though hfa poor)  Nl routine is symbicort 160 2 bid and maybe ventolin and occ brovana no recent duoneb need  In retrospect really hasn't been able to really stay well x 4 y Cough is harsh and congested worse day than noct  rec Start Prednisone 10mg  Take 4 for two days three for two days two for two days one for two days  Plan A =  Automatic = symbicort 160 Take 2 puffs first thing in am and then another 2 puffs about 12 hours later.  Work on Interior and spatial designer: Plan B = Backup  Only use your albuterol  Plan C = crisis  Only use nebulizer iprotropium-albuterol (duoneb) if you try the ventolin first and it fails to help > ok to use up to every 4 hours  Plan D = doctor call us if not improving  Plan E = ER > go there if all else fails Try prilosec otc 20mg   Take 30-60 min before first meal of the day and Pepcid ac (famotidine) 20 mg one @  bedtime    GERD (REFLUX) diet        11/28/2017  f/u ov/Gabrielle White re: GOLD II on symb 160 2bid/ trouble affording meds but still smoking  Chief Complaint  Patient presents with  . Follow-up    PFT's done today. Her  breathing is unchanged. She uses her albuterol inhaler 2-3 x per day on average.    Dyspnea:  MMRC1 = can walk nl pace, flat grade, can't hurry or go uphills or steps s sob   Cough: after supper but not sleeping Sleeping: ok mostly flat  SABA use: as above, problems proair 02: none   rec Plan A = Automatic/ no matter  = symbicort 160 Take 2 puffs first thing in am and then another 2 puffs about 12 hours later.  Work on Interior and spatial designer:   Plan B = Backup Only use your albuterol Financial trader) as a rescue medication Plan C = Crisis - only use your albuterol nebulizer if you first try Plan B and it fails to help > ok to use the nebulizer up to every 4 hours but if start needing it regularly call for immediate appointment Plan D = Deltasone (prednisone) - Prednisone 10 mg take  4 each am x 2 days,   2 each am x 2 days,  1 each am x 2 days and stop  Please schedule a follow up visit in 3 months but call sooner if needed  with  all medications /inhalers/ solutions in hand so we can verify exactly what you are taking. This includes all medications from all doctors and over the counters      03/11/2018  f/u ov/Gabrielle White re:  GOLD II/ still smoking / maint symbicort 160 2bid Chief Complaint  Patient presents with  . Follow-up    Breathing is about the same. She is still smoking- 1 ppd.    Dyspnea:  MMRC1 = can walk nl pace, flat grade, can't hurry or go uphills or steps s sob  / works as Oceanographer  Cough: minimal mucoid, not worse in am Sleeping:  Sleeps on couch/  pillows prop her up  SABA use: 2-3 x per day / no recent need for pred cycle   No obvious day to day or daytime variability or assoc excess/ purulent sputum or mucus plugs or hemoptysis or cp or chest tightness, subjective wheeze or overt   hb symptoms.   Sleeping as above  without nocturnal  exacerbation  of respiratory  c/o's or need for noct saba. Also denies any obvious fluctuation of symptoms with weather or  environmental changes or other aggravating or alleviating factors except as outlined above   No unusual exposure hx or h/o childhood pna/ asthma or knowledge of premature birth.  Current Allergies, Complete Past Medical History, Past Surgical History, Family History, and Social History were reviewed in [employer] record.  ROS  The following are not active complaints unless bolded Hoarseness, sore throat, dysphagia, dental problems, itching, sneezing,  nasal congestion or discharge of excess mucus or purulent secretions, ear ache,   fever, chills, sweats, unintended wt loss or wt gain, classically pleuritic or exertional cp,  orthopnea pnd or arm/hand swelling  or leg swelling, presyncope, palpitations, abdominal pain, anorexia, nausea, vomiting, diarrhea  or change in bowel habits or change in bladder habits, change in stools or change in urine, dysuria, hematuria,  rash, arthralgias, visual complaints, headache, numbness, weakness or ataxia or problems with walking or coordination,  change in mood or  memory.        Current Meds  Medication Sig  . albuterol (PROVENTIL HFA;VENTOLIN HFA) 108 (90 Base) MCG/ACT inhaler INHALE 1 TO 2 PUFFS INTO THE LUNGS EVERY 6 HOURS AS NEEDED FOR WHEEZING OR SHORTNESS OF BREATH  . ALPRAZolam (XANAX) 0.5 MG tablet TAKE 1 TABLET BY MOUTH TWICE DAILY AS NEEDED FOR NERVOUSNESS/SLEEP  . atorvastatin (LIPITOR) 40 MG tablet TAKE 1 TABLET BY MOUTH DAILY FOR HIGH CHOLESTEROL  . budesonide-formoterol (SYMBICORT) 160-4.5 MCG/ACT inhaler Take 2 puffs first thing in am and then another 2 puffs about 12 hours later.  . Cholecalciferol (VITAMIN D3) 1000 UNITS CAPS Take 1 capsule by mouth daily.    Marland Kitchen levothyroxine (SYNTHROID, LEVOTHROID) 112 MCG tablet TAKE 1 TABLET(112 MCG) BY MOUTH DAILY  . losartan (COZAAR) 50 MG tablet TAKE 1 TABLET BY MOUTH EVERY DAY FOR BLOOD PRESSURE  . nystatin (MYCOSTATIN/NYSTOP) powder Apply topically 3 (three) times daily.  .  sertraline (ZOLOFT) 100 MG tablet TAKE 1 TABLET BY MOUTH DAILY                      Objective:   Physical Exam  amb wf nad  Vital signs reviewed - Note on arrival 02 sats  96% on RA      01/22/2015      156 > 03/08/2015   162 > 06/07/2015  170 > 03/20/2016 160 > 05/15/2016   162 >  08/16/2016 163 > 11/20/2016  161 > 12/12/2016  162 > 05/23/2017  159 > 11/28/2017  159  > 03/11/2018  164     01/16/13 156 lb (70.761 kg)  01/08/13 155 lb 3.2 oz (70.398 kg)  12/20/12 157 lb 9.6 oz (71.487 kg)       HEENT: nl dentition / oropharynx. Nl external ear canals without cough reflex -  Mild bilateral non-specific turbinate edema     NECK :  without JVD/Nodes/TM/ nl carotid upstrokes bilaterally   LUNGS: no acc muscle use,  Mild barrel  contour chest wall with bilateral insp/exp rhonchi   without cough on insp or exp maneuver and mild  Hyperresonant  to  percussion bilaterally     CV:  RRR  no s3 or murmur or increase in P2, and no edema   ABD:  soft and nontender with pos late  insp Hoover's  in the supine position. No bruits or organomegaly appreciated, bowel sounds nl  MS:   Nl gait/  ext warm without deformities, calf tenderness, cyanosis or clubbing No obvious joint restrictions   SKIN: warm and dry without lesions    NEURO:  alert, approp, nl sensorium with  no motor or cerebellar deficits apparent.             Assessment & Plan:

## 2018-03-12 ENCOUNTER — Encounter: Payer: Self-pay | Admitting: Internal Medicine

## 2018-03-12 DIAGNOSIS — J310 Chronic rhinitis: Secondary | ICD-10-CM | POA: Insufficient documentation

## 2018-03-12 NOTE — Assessment & Plan Note (Addendum)
-   PFTs  11/18/10  FEV1  2.0 (95%) ratio 73 and no change p saba and DLCO 75 corrects to 100% -PFT's  03/08/2015  FEV1 1.85 (78 % ) ratio 73  p 5 % improvement from saba with DLCO  63 % corrects to 72 % for alv volume and erv 17   - added flutter 06/07/2015  - PFT's  08/16/2016  FEV1 1.42 (61 % ) ratio 71  p 8 % improvement FEV1 (and 20% FVC) from saba p symb 160  prior to study with DLCO  61/58 % corrects to 78 % for alv volume    - Allergy profile 08/16/2016 >  Eos 0.5 /  IgE  99 RAST neg -  Added prednisone x 6 days cycles 11/20/2016 for prn use - 05/23/2017  After extensive coaching HFA effectiveness =    90% - PFT's  11/28/2017  FEV1 1.27 (57 % ) ratio 63  p 6 % improvement from saba p symb x 160 x 2  prior to study with DLCO  61 % corrects to 81  % for alv volume     - 03/11/2018  After extensive coaching inhaler device,  effectiveness = 90% from a basline of 75%     Despite active smoking she is relatively well compensated on present rx and no need for change for now - has pred x 6 day cycles for any flares between visits/ reviewed with pt

## 2018-03-12 NOTE — Assessment & Plan Note (Signed)
pnds bothering sleep, also restless off xanax   >>> rec trial of 1st gen H1 blockers per guidelines @ hs   Given they may help her sleep as well

## 2018-03-12 NOTE — Assessment & Plan Note (Signed)
4-5 min discussion re active cigarette smoking in addition to office E&M  Ask about tobacco use:   ongoing Advise quitting   I emphasized that although we never turn away smokers from the pulmonary clinic, we do ask that they understand that the recommendations that we make  won't work nearly as well in the presence of continued cigarette exposure. In fact, we may very well  reach a point where we can't promise to help the patient if he/she can't quit smoking. (We can and will promise to try to help, we just can't promise what we recommend will really work)  Assess willingness:  Not committed at this point Assist in quit attempt:  Per PCP when ready Arrange follow up:   Follow up per Primary Care planned      I had an extended discussion with the patient reviewing all relevant studies completed to date and  lasting 15 to 20 minutes of a 25 minute visit    See device teaching which extended face to face time for this visit.  Each maintenance medication was reviewed in detail including emphasizing most importantly the difference between maintenance and prns and under what circumstances the prns are to be triggered using an action plan format that is not reflected in the computer generated alphabetically organized AVS which I have not found useful in most complex patients, especially with respiratory illnesses  Please see AVS for specific instructions unique to this visit that I personally wrote and verbalized to the the pt in detail and then reviewed with pt  by my nurse highlighting any  changes in therapy recommended at today's visit to their plan of care.

## 2018-03-20 ENCOUNTER — Ambulatory Visit: Payer: Self-pay

## 2018-03-29 ENCOUNTER — Other Ambulatory Visit: Payer: Medicare Other

## 2018-04-03 ENCOUNTER — Other Ambulatory Visit: Payer: Self-pay | Admitting: Nurse Practitioner

## 2018-04-04 ENCOUNTER — Other Ambulatory Visit: Payer: Self-pay | Admitting: *Deleted

## 2018-04-04 MED ORDER — ALPRAZOLAM 0.5 MG PO TABS: ORAL_TABLET | ORAL | 0 refills | Status: DC

## 2018-04-04 NOTE — Telephone Encounter (Signed)
Patient called asking why her Alprazolam was denied this month. Stated that it is due. I see she has canceled a couple of appointments and she stated it was because she cant afford to come right now. Stated that she is suppose to start a new job on 1/27. Stated that she just had to replace her hot water heater and she doesn't have extra money at this time. Wants the Alprazolam refilled. Stated that she will call after the 27th to schedule an appointment.   [place] Verified LR: 01/07/2018 Pended Rx and sent to [facility] for Approval.

## 2018-04-08 ENCOUNTER — Ambulatory Visit: Payer: Medicare Other | Admitting: Internal Medicine

## 2018-04-18 ENCOUNTER — Ambulatory Visit: Payer: Medicare Other

## 2018-04-22 ENCOUNTER — Other Ambulatory Visit: Payer: Self-pay | Admitting: Nurse Practitioner

## 2018-04-22 DIAGNOSIS — I10 Essential (primary) hypertension: Secondary | ICD-10-CM

## 2018-05-06 ENCOUNTER — Ambulatory Visit (INDEPENDENT_AMBULATORY_CARE_PROVIDER_SITE_OTHER): Payer: Medicare Other | Admitting: Nurse Practitioner

## 2018-05-06 ENCOUNTER — Encounter: Payer: Self-pay | Admitting: Nurse Practitioner

## 2018-05-06 VITALS — BP 130/82 | HR 82 | Temp 97.6°F | Ht 64.0 in | Wt 160.0 lb

## 2018-05-06 DIAGNOSIS — M545 Low back pain, unspecified: Secondary | ICD-10-CM

## 2018-05-06 DIAGNOSIS — F329 Major depressive disorder, single episode, unspecified: Secondary | ICD-10-CM

## 2018-05-06 DIAGNOSIS — M159 Polyosteoarthritis, unspecified: Secondary | ICD-10-CM

## 2018-05-06 DIAGNOSIS — Z23 Encounter for immunization: Secondary | ICD-10-CM

## 2018-05-06 DIAGNOSIS — F17210 Nicotine dependence, cigarettes, uncomplicated: Secondary | ICD-10-CM | POA: Diagnosis not present

## 2018-05-06 DIAGNOSIS — F419 Anxiety disorder, unspecified: Secondary | ICD-10-CM

## 2018-05-06 DIAGNOSIS — I10 Essential (primary) hypertension: Secondary | ICD-10-CM

## 2018-05-06 DIAGNOSIS — E038 Other specified hypothyroidism: Secondary | ICD-10-CM

## 2018-05-06 DIAGNOSIS — F32A Depression, unspecified: Secondary | ICD-10-CM

## 2018-05-06 DIAGNOSIS — M150 Primary generalized (osteo)arthritis: Secondary | ICD-10-CM

## 2018-05-06 DIAGNOSIS — E782 Mixed hyperlipidemia: Secondary | ICD-10-CM

## 2018-05-06 MED ORDER — TETANUS-DIPHTH-ACELL PERTUSSIS 5-2.5-18.5 LF-MCG/0.5 IM SUSP: 0.5000 mL | Freq: Once | INTRAMUSCULAR | 0 refills | Status: AC

## 2018-05-06 MED ORDER — BUPROPION HCL ER (XL) 150 MG PO TB24: 150.0000 mg | ORAL_TABLET | Freq: Every day | ORAL | 1 refills | Status: DC

## 2018-05-06 MED ORDER — LOSARTAN POTASSIUM 50 MG PO TABS: 50.0000 mg | ORAL_TABLET | Freq: Every day | ORAL | 1 refills | Status: DC

## 2018-05-06 MED ORDER — ALPRAZOLAM 0.5 MG PO TABS: ORAL_TABLET | ORAL | 0 refills | Status: DC

## 2018-05-06 MED ORDER — LEVOTHYROXINE SODIUM 112 MCG PO TABS: ORAL_TABLET | ORAL | 1 refills | Status: DC

## 2018-05-06 NOTE — Progress Notes (Signed)
Careteam: Patient Care Team: Lauree Chandler, NP as PCP - General (Geriatric Medicine)  Advanced Directive information Does Patient Have a Medical Advance Directive?: Festus Holts, Does patient want to make changes to medical advance directive?: No - Patient declined  Allergies  Allergen Reactions  . Cafergot   . Codeine   . Fenoprofen Calcium   . Nalfon [Fenoprofen]     Chief Complaint  Patient presents with  . Medical Management of Chronic Issues    PHQ score6 medication refills, back pain when standing symptoms for a couple of months, no dysuira ,no urine odor      HPI: Patient is a 73 y.o. female seen in the office today for routine follow up. She tripped over vacuum cleaner and had a fracture of her radius. S/p repair. No pain.   Starting a new job next week.   Reports she has no motivation, unorganized. She reports she stays depressed. Son is in [place]. Has empathy but not like she used to be. Dreads going out. No SI or HI, "unless something happens to my son"  Struggles with finances, which is stressful.  Has no hot water. Lots of issues with her house.    Insomnia- uses xanax for to help sleep. Tired going without it when she cant, does not like taking it every night  COPD- following with pulmonary, compensated. Continues to smoke. Continues on symbicort twice daily and albuterol PRN  Back pain-ongoing, worse when she stands for a long time.   htn- controlled on losartan  Hyperlipidemia- taking lipitor 40 mg daily, LDL at goal.   Hyperthyroid- taking synthroid 112 mcg daily   Review of Systems:  Review of Systems  Constitutional: Negative for chills, fever and weight loss.  HENT: Negative for sore throat and tinnitus.   Respiratory: Positive for cough and wheezing. Negative for sputum production and shortness of breath.        Baseline pulmonary status  Cardiovascular: Negative for chest pain, palpitations and leg swelling.  Gastrointestinal: Negative for  abdominal pain, constipation, diarrhea and heartburn.  Genitourinary: Negative for dysuria, frequency and urgency.  Musculoskeletal: Positive for back pain (back ache occasionally) and joint pain (OA). Negative for falls and myalgias.  Skin: Negative.   Neurological: Negative for dizziness, tingling, sensory change, speech change and headaches.  Psychiatric/Behavioral: Positive for depression. Negative for memory loss. The patient does not have insomnia.     Past Medical History:  Diagnosis Date  . Chronic airway obstruction, not elsewhere classified   . Depressive disorder   . Depressive disorder, not elsewhere classified   . Emphysema   . Enthesopathy of ankle and tarsus, unspecified   . HTN (hypertension)   . Hyperlipidemia   . Hypertension   . Hypopotassemia   . Leukocytosis   . Migraine, unspecified, without mention of intractable migraine without mention of status migrainosus   . Nonspecific (abnormal) findings on radiological and other examination of skull and head   . Osteoarthrosis, unspecified whether generalized or localized, unspecified site   . Other emphysema ([place])   . Other specified disease of white blood cells   . Palpitations   . Tobacco abuse    Past Surgical History:  Procedure Laterality Date  . CESAREAN SECTION    . COLONOSCOPY  08/14/2008   Dr.. Delfin Edis   Social History:   reports that she has been smoking cigarettes. She has a 28.00 pack-year smoking history. She has never used smokeless tobacco. She reports that she does not  drink alcohol or use drugs.  Family History  Problem Relation Age of Onset  . Alzheimer's disease Father   . Heart disease Father   . Diabetes Father   . Skin cancer Father   . Heart disease Mother   . Breast cancer Mother     Medications: Patient's Medications  New Prescriptions   No medications on file  Previous Medications   ALBUTEROL (PROVENTIL HFA;VENTOLIN HFA) 108 (90 BASE) MCG/ACT INHALER    INHALE 1 TO 2  PUFFS INTO THE LUNGS EVERY 6 HOURS AS NEEDED FOR WHEEZING OR SHORTNESS OF BREATH   ALPRAZOLAM (XANAX) 0.5 MG TABLET    Take one tablet by mouth twice daily as needed for nervousness/sleep   ATORVASTATIN (LIPITOR) 40 MG TABLET    TAKE 1 TABLET BY MOUTH DAILY FOR HIGH CHOLESTEROL   BUDESONIDE-FORMOTEROL (SYMBICORT) 160-4.5 MCG/ACT INHALER    Take 2 puffs first thing in am and then another 2 puffs about 12 hours later.   CHOLECALCIFEROL (VITAMIN D3) 1000 UNITS CAPS    Take 1 capsule by mouth daily.     LEVOTHYROXINE (SYNTHROID, LEVOTHROID) 112 MCG TABLET    TAKE 1 TABLET(112 MCG) BY MOUTH DAILY   LOSARTAN (COZAAR) 50 MG TABLET    TAKE 1 TABLET BY MOUTH EVERY DAY FOR BLOOD PRESSURE   PREDNISONE (DELTASONE) 10 MG TABLET    Take 1 tablet (10 mg total) by mouth daily as needed.   SERTRALINE (ZOLOFT) 100 MG TABLET    TAKE 1 TABLET BY MOUTH DAILY   TDAP (BOOSTRIX) 5-2.5-18.5 LF-MCG/0.5 INJECTION    Inject 0.5 mLs into the muscle once.  Modified Medications   No medications on file  Discontinued Medications   NYSTATIN (MYCOSTATIN/NYSTOP) POWDER    Apply topically 3 (three) times daily.     Physical Exam:  Vitals:   05/06/18 1315  BP: 130/82  Pulse: 82  SpO2: 91%  Weight: 160 lb (72.6 kg)  Height: 5\' 4"  (1.626 m)   Body mass index is 27.46 kg/m.  Physical Exam Constitutional:      General: She is not in acute distress.    Appearance: She is well-developed. She is not diaphoretic.  HENT:     Head: Normocephalic and atraumatic.  Eyes:     General:        Left eye: No discharge.     Conjunctiva/sclera: Conjunctivae normal.     Pupils: Pupils are equal, round, and reactive to light.     Comments: Corrective lenses.  Neck:     Musculoskeletal: Normal range of motion and neck supple.  Cardiovascular:     Rate and Rhythm: Normal rate and regular rhythm.     Heart sounds: Normal heart sounds. No murmur. No friction rub. No gallop.   Pulmonary:     Effort: Pulmonary effort is normal.      Breath sounds: Normal breath sounds. No wheezing or rales.  Abdominal:     General: Bowel sounds are normal.     Palpations: Abdomen is soft.  Musculoskeletal: Normal range of motion.        General: No tenderness.  Skin:    General: Skin is warm and dry.     Coloration: Skin is not pale.     Findings: No erythema or rash.  Neurological:     Mental Status: She is alert and oriented to person, place, and time.     Cranial Nerves: No cranial nerve deficit.     Coordination: Coordination normal.  Psychiatric:  Behavior: Behavior normal.        Thought Content: Thought content normal.    Labs reviewed: Basic Metabolic Panel: Recent Labs    01/07/18 1011  NA 141  K 4.2  CL 103  CO2 32  GLUCOSE 86  BUN 9  CREATININE 0.79  CALCIUM 8.8  TSH 4.53*   Liver Function Tests: Recent Labs    01/07/18 1011  AST 14  ALT 14  BILITOT 0.5  PROT 6.2   No results for input(s): LIPASE, AMYLASE in the last 8760 hours. No results for input(s): AMMONIA in the last 8760 hours. CBC: Recent Labs    01/07/18 1011  WBC 9.9  NEUTROABS 6,425  HGB 14.4  HCT 43.6  MCV 92.0  PLT 250   Lipid Panel: Recent Labs    01/07/18 1011  CHOL 144  HDL 50*  LDLCALC 72  TRIG 134  CHOLHDL 2.9   TSH: Recent Labs    01/07/18 1011  TSH 4.53*   A1C: Lab Results  Component Value Date   HGBA1C 5.9 (H) 06/17/2013     Assessment/Plan 1. Essential hypertension -controlled on losartan. Cont on current regimen with diet modifications  - losartan (COZAAR) 50 MG tablet; Take 1 tablet (50 mg total) by mouth daily.  Dispense: 90 tablet; Refill: 1 - COMPLETE METABOLIC PANEL WITH GFR  2. Other specified hypothyroidism - levothyroxine (SYNTHROID, LEVOTHROID) 112 MCG tablet; TAKE 1 TABLET(112 MCG) BY MOUTH DAILY  Dispense: 90 tablet; Refill: 1 - TSH  3. Cigarette smoker -states she enjoys smoking, no motivation to quit.   4. Acute midline low back pain without sciatica Stable, uses  tylenol PRN  5. Mixed hyperlipidemia contines on lipitor.  - COMPLETE METABOLIC PANEL WITH GFR  6. Anxiety and depression -worsening depression. Had started on wellbutrin in the past but then felt like she did not need it however now feeling that she would benefit. No HI or SI.  - ALPRAZolam (XANAX) 0.5 MG tablet; Take one tablet by mouth twice daily as needed for nervousness/sleep  Dispense: 60 tablet; Refill: 0 - buPROPion (WELLBUTRIN XL) 150 MG 24 hr tablet; Take 1 tablet (150 mg total) by mouth daily.  Dispense: 30 tablet; Refill: 1  7. Need for influenza vaccination - Flu Vaccine QUAD 36+ mos IM  8. Primary osteoarthritis involving multiple joints Using CBD oil topically to hands. Advised against oral due to side effects. Plans to check with pharmary to see if there is any interactions.   Next appt: 6 weeks for AWV and follow up on depression.  Carlos American. Cut[place], [facility] [phone]

## 2018-05-06 NOTE — Patient Instructions (Addendum)
Start Wellbutrin 150 mg by mouth daily   Follow up in 6 weeks for AWV (or when you are able)   Follow up 6 months for routine follow up

## 2018-05-07 ENCOUNTER — Other Ambulatory Visit: Payer: Self-pay

## 2018-05-07 ENCOUNTER — Other Ambulatory Visit: Payer: Self-pay | Admitting: Nurse Practitioner

## 2018-05-07 DIAGNOSIS — E038 Other specified hypothyroidism: Secondary | ICD-10-CM

## 2018-05-07 DIAGNOSIS — R7989 Other specified abnormal findings of blood chemistry: Secondary | ICD-10-CM

## 2018-05-07 LAB — COMPLETE METABOLIC PANEL WITH GFR
AG RATIO: 1.6 (calc) (ref 1.0–2.5)
ALBUMIN MSPROF: 4.2 g/dL (ref 3.6–5.1)
ALT: 18 U/L (ref 6–29)
AST: 21 U/L (ref 10–35)
Alkaline phosphatase (APISO): 104 U/L (ref 37–153)
BUN: 12 mg/dL (ref 7–25)
CO2: 32 mmol/L (ref 20–32)
Calcium: 9.3 mg/dL (ref 8.6–10.4)
Chloride: 103 mmol/L (ref 98–110)
Creat: 0.81 mg/dL (ref 0.60–0.93)
GFR, EST AFRICAN AMERICAN: 84 mL/min/{1.73_m2} (ref >=60)
GFR, EST NON AFRICAN AMERICAN: 72 mL/min/{1.73_m2} (ref >=60)
GLUCOSE: 90 mg/dL (ref 65–139)
Globulin: 2.6 g/dL (calc) (ref 1.9–3.7)
POTASSIUM: 4.3 mmol/L (ref 3.5–5.3)
SODIUM: 141 mmol/L (ref 135–146)
Total Bilirubin: 0.5 mg/dL (ref 0.2–1.2)
Total Protein: 6.8 g/dL (ref 6.1–8.1)

## 2018-05-07 LAB — TSH: TSH: 7.95 m[IU]/L — AB (ref 0.40–4.50)

## 2018-05-07 MED ORDER — LEVOTHYROXINE SODIUM 125 MCG PO TABS: ORAL_TABLET | ORAL | 2 refills | Status: DC

## 2018-05-07 MED ORDER — LEVOTHYROXINE SODIUM 125 MCG PO TABS: 125.0000 ug | ORAL_TABLET | Freq: Every day | ORAL | 0 refills | Status: DC

## 2018-05-24 ENCOUNTER — Other Ambulatory Visit: Payer: Self-pay | Admitting: Internal Medicine

## 2018-05-24 DIAGNOSIS — J449 Chronic obstructive pulmonary disease, unspecified: Secondary | ICD-10-CM

## 2018-06-02 ENCOUNTER — Other Ambulatory Visit: Payer: Self-pay | Admitting: Nurse Practitioner

## 2018-06-10 ENCOUNTER — Ambulatory Visit: Payer: Medicare Other | Admitting: Internal Medicine

## 2018-06-10 ENCOUNTER — Encounter: Payer: Self-pay | Admitting: Internal Medicine

## 2018-06-10 VITALS — BP 130/70 | HR 79 | Ht 64.0 in | Wt 161.6 lb

## 2018-06-10 DIAGNOSIS — J449 Chronic obstructive pulmonary disease, unspecified: Secondary | ICD-10-CM | POA: Diagnosis not present

## 2018-06-10 DIAGNOSIS — F17210 Nicotine dependence, cigarettes, uncomplicated: Secondary | ICD-10-CM | POA: Diagnosis not present

## 2018-06-10 NOTE — Patient Instructions (Signed)
The key is to stop smoking completely before smoking completely stops you! -For smoking cessation classes call [phone]   Please remember to go to the lab department   for your tests - we will call you with the results when they are available.      Schedule a visit for shared decision making with Eric Form    .Please schedule a follow up visit in 6  months but call sooner if needed

## 2018-06-10 NOTE — Progress Notes (Addendum)
Subjective:   Patient ID: Gabrielle White, female    DOB: 1945/05/14 .   MRN: [id number]    Brief patient profile:  77  yowf active smoker with AB/GOLD 0 copd by pfts 03/08/15 and GOLD II criteria 11/28/2017     History of Present Illness  01/22/2015 acute extended re-establish ov/Jonanthan Bolender re:  AB/ still smoking  Chief Complaint  Patient presents with  . Pulmonary Consult    pt last seen in 2014. pt states DR. Green wanted her to follow up. pt states somethines her throat feels like it closes up and she cant breath. pt states she feels like she has to take really deep breaths. pt c/o chest tightness, and prod cough white in color.pt c/o thrush she thiks may come from the inhailers.     Last better breathing  x 4 weeks prior to OV  p using prednisone but benefit only for a few days p stopped despite maint rx with symbicort (though hfa poor)  Nl routine is symbicort 160 2 bid and maybe ventolin and occ brovana no recent duoneb need  In retrospect really hasn't been able to really stay well x 4 y Cough is harsh and congested worse day than noct  rec Start Prednisone 10mg  Take 4 for two days three for two days two for two days one for two days  Plan A =  Automatic = symbicort 160 Take 2 puffs first thing in am and then another 2 puffs about 12 hours later.  Work on Interior and spatial designer: Plan B = Backup  Only use your albuterol  Plan C = crisis  Only use nebulizer iprotropium-albuterol (duoneb) if you try the ventolin first and it fails to help > ok to use up to every 4 hours  Plan D = doctor call us if not improving  Plan E = ER > go there if all else fails Try prilosec otc 20mg   Take 30-60 min before first meal of the day and Pepcid ac (famotidine) 20 mg one @  bedtime    GERD (REFLUX) diet        11/28/2017  f/u ov/Adalia Pettis re: GOLD II on symb 160 2bid/ trouble affording meds but still smoking  Chief Complaint  Patient presents with  . Follow-up    PFT's done today. Her  breathing is unchanged. She uses her albuterol inhaler 2-3 x per day on average.    Dyspnea:  MMRC1 = can walk nl pace, flat grade, can't hurry or go uphills or steps s sob   Cough: after supper but not sleeping Sleeping: ok mostly flat  SABA use: as above, problems proair 02: none   rec Plan A = Automatic/ no matter  = symbicort 160 Take 2 puffs first thing in am and then another 2 puffs about 12 hours later.  Work on Interior and spatial designer:   Plan B = Backup Only use your albuterol [employer]) as a rescue medication Plan C = Crisis - only use your albuterol nebulizer if you first try Plan B and it fails to help > ok to use the nebulizer up to every 4 hours but if start needing it regularly call for immediate appointment Plan D = Deltasone (prednisone) - Prednisone 10 mg take  4 each am x 2 days,   2 each am x 2 days,  1 each am x 2 days and stop  Please schedule a follow up visit in 3 months but call sooner if needed  with  all medications /inhalers/ solutions in hand so we can verify exactly what you are taking. This includes all medications from all doctors and over the counters      03/11/2018  f/u ov/Lui Bellis re:  GOLD II/ still smoking / maint symbicort 160 2bid Chief Complaint  Patient presents with  . Follow-up    Breathing is about the same. She is still smoking- 1 ppd.    Dyspnea:  MMRC1 = can walk nl pace, flat grade, can't hurry or go uphills or steps s sob  / works as Oceanographer  Cough: minimal mucoid, not worse in am Sleeping:  Sleeps on couch/  pillows prop her up  SABA use: 2-3 x per day / no recent need for pred cycle  rec For drainage / throat tickle try take CHLORPHENIRAMINE  4 mg (chlortab at [employer])  - take one every 4 hours as needed - available over the counter- may cause drowsiness so start with just a bedtime dose or two and see how you tolerate it before trying in daytime   Only use your albuterol as a rescue medication  Please schedule a follow up  visit in 3 months but call sooner if needed  - needs alpha screening next ov    06/10/2018  f/u ov/Kayra Crowell re:  GOLD II copd/ still smoking / maint on symb 160  Chief Complaint  Patient presents with  . Follow-up    Breathing is overall doing well today. She has good days and bad days. She is using her albuterol inhaler 3 x per day on average.   Dyspnea:  MMRC1 = can walk nl pace, flat grade, can't hurry or go uphills or steps s sob   Cough: worse at home with animal exp Sleeping: on back  @ 30 degrees on couch due to breathing more comfortable but no pnd SABA use: up to 3 x daily  02: none    No obvious day to day or daytime variability or assoc excess/ purulent sputum or mucus plugs or hemoptysis or cp or chest tightness, subjective wheeze or overt sinus or hb symptoms.   Sleeping as above  without nocturnal  or early am exacerbation  of respiratory  c/o's or need for noct saba. Also denies any obvious fluctuation of symptoms with weather or environmental changes or other aggravating or alleviating factors except as outlined above   No unusual exposure hx or h/o childhood pna/ asthma or knowledge of premature birth.  Current Allergies, Complete Past Medical History, Past Surgical History, Family History, and Social History were reviewed in [employer] record.  ROS  The following are not active complaints unless bolded Hoarseness, sore throat, dysphagia, dental problems, itching, sneezing,  nasal congestion or discharge of excess mucus or purulent secretions, ear ache,   fever, chills, sweats, unintended wt loss or wt gain, classically pleuritic or exertional cp,  orthopnea pnd or arm/hand swelling  or leg swelling, presyncope, palpitations, abdominal pain, anorexia, nausea, vomiting, diarrhea  or change in bowel habits or change in bladder habits, change in stools or change in urine, dysuria, hematuria,  rash, arthralgias, visual complaints, headache, numbness, weakness  or ataxia or problems with walking or coordination,  change in mood or  memory.        Current Meds  Medication Sig  . albuterol (PROAIR HFA) 108 (90 Base) MCG/ACT inhaler INHALE 1 TO 2 PUFF INTO THE LUNGS EVERY 6 HOURS AS NEEDED FOR WHEEZING OR SHORTNESS OF BREATH  . ALPRAZolam (  XANAX) 0.5 MG tablet Take one tablet by mouth twice daily as needed for nervousness/sleep  . atorvastatin (LIPITOR) 40 MG tablet TAKE 1 TABLET BY MOUTH DAILY FOR HIGH CHOLESTEROL  . budesonide-formoterol (SYMBICORT) 160-4.5 MCG/ACT inhaler Take 2 puffs first thing in am and then another 2 puffs about 12 hours later.  Marland Kitchen buPROPion (WELLBUTRIN XL) 150 MG 24 hr tablet Take 1 tablet (150 mg total) by mouth daily.  . Cholecalciferol (VITAMIN D3) 1000 UNITS CAPS Take 1 capsule by mouth daily.    Marland Kitchen levothyroxine (SYNTHROID, LEVOTHROID) 125 MCG tablet Take 1 tablet (125 mcg total) by mouth daily before breakfast.  . losartan (COZAAR) 50 MG tablet Take 1 tablet (50 mg total) by mouth daily.  . sertraline (ZOLOFT) 100 MG tablet TAKE 1 TABLET BY MOUTH DAILY                        Objective:   Physical Exam  amb wf nad   Vital signs reviewed - Note on arrival 02 sats  92% on RA    01/22/2015      156 > 03/08/2015   162 > 06/07/2015  170 > 03/20/2016 160 > 05/15/2016   162 >   08/16/2016 163 > 11/20/2016  161 > 12/12/2016  162 > 05/23/2017  159 > 11/28/2017  159  > 03/11/2018  164 > 06/10/2018  161     01/16/13 156 lb (70.761 kg)  01/08/13 155 lb 3.2 oz (70.398 kg)  12/20/12 157 lb 9.6 oz (71.487 kg)        HEENT: nl dentition / oropharynx. Nl external ear canals without cough reflex -  Mild bilateral non-specific turbinate edema     NECK :  without JVD/Nodes/TM/ nl carotid upstrokes bilaterally   LUNGS: no acc muscle use,  Mild barrel  contour chest wall with bilateral  Distant bs s audible wheeze and  without cough on insp or exp maneuver and mild  Hyperresonant  to  percussion bilaterally     CV:  RRR  no s3 or  murmur or increase in P2, and no edema   ABD:  soft and nontender with pos lat  insp Hoover's  in the supine position. No bruits or organomegaly appreciated, bowel sounds nl  MS:   Nl gait/  ext warm without deformities, calf tenderness, cyanosis or clubbing No obvious joint restrictions   SKIN: warm and dry without lesions    NEURO:  alert, approp, nl sensorium with  no motor or cerebellar deficits apparent.           Labs ordered 06/10/2018   alph one AT screen       Assessment & Plan:

## 2018-06-11 ENCOUNTER — Encounter: Payer: Self-pay | Admitting: Internal Medicine

## 2018-06-11 NOTE — Assessment & Plan Note (Addendum)
Active smoker - PFTs  11/18/10  FEV1  2.0 (95%) ratio 73 and no change p saba and DLCO 75 corrects to 100% -PFT's  03/08/2015  FEV1 1.85 (78 % ) ratio 73  p 5 % improvement from saba with DLCO  63 % corrects to 72 % for alv volume and erv 17   - added flutter 06/07/2015  - PFT's  08/16/2016  FEV1 1.42 (61 % ) ratio 71  p 8 % improvement FEV1 (and 20% FVC) from saba p symb 160  prior to study with DLCO  61/58 % corrects to 78 % for alv volume    - Allergy profile 08/16/2016 >  Eos 0.5 /  IgE  99 RAST neg -  ADDED prednisone x 6 days cycles 11/20/2016  - 05/23/2017  After extensive coaching HFA effectiveness =    90% - PFT's  11/28/2017  FEV1 1.27 (57 % ) ratio 63  p 6 % improvement from saba p symb x 160 x 2  prior to study with DLCO  61 % corrects to 81  % for alv volume     - 06/10/2018  After extensive coaching inhaler device,  effectiveness =    90%  - alpha one AT screen 06/10/2018  pending    Adequate control on present rx, reviewed in detail with pt > no change in rx needed

## 2018-06-11 NOTE — Assessment & Plan Note (Signed)
Counseled re importance of smoking cessation but did not meet time criteria for separate billing     Low-dose CT lung cancer screening is recommended for patients who are 55-74 years of age with a 30+ pack-year history of smoking, and who are currently smoking or quit <=15 years ago.   Discussed in detail all the  indications, usual  risks and alternatives  relative to the benefits with patient who agrees to proceed with   shared decision making   I had an extended discussion with the patient reviewing all relevant studies completed to date and  lasting 15 to 20 minutes of a 25 minute visit    See device teaching which extended face to face time for this visit.  Each maintenance medication was reviewed in detail including emphasizing most importantly the difference between maintenance and prns and under what circumstances the prns are to be triggered using an action plan format that is not reflected in the computer generated alphabetically organized AVS which I have not found useful in most complex patients, especially with respiratory illnesses  Please see AVS for specific instructions unique to this visit that I personally wrote and verbalized to the the pt in detail and then reviewed with pt  by my nurse highlighting any  changes in therapy recommended at today's visit to their plan of care.

## 2018-06-12 ENCOUNTER — Telehealth: Payer: Self-pay | Admitting: *Deleted

## 2018-06-12 NOTE — Telephone Encounter (Signed)
That is up to her. To practice Good hand hygiene and avoid touching face.

## 2018-06-12 NOTE — Telephone Encounter (Signed)
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 Patient wonders if this is a good idea with having COPD and the concerns of the Coronavirus. Wants your opinion if she should substitute or not. Please Advise.

## 2018-06-12 NOTE — Telephone Encounter (Signed)
Patient notified and agreed.  

## 2018-06-18 LAB — ALPHA-1 ANTITRYPSIN PHENOTYPE: A-1 Antitrypsin, Ser: 141 mg/dL (ref 83–199)

## 2018-06-19 ENCOUNTER — Telehealth: Payer: Self-pay | Admitting: Internal Medicine

## 2018-06-19 NOTE — Telephone Encounter (Signed)
Called and spoke with Patient.  Results and recommendations from Dr Melvyn Novas given.  Patient stated understanding.  Nothing further at this time.   Notes recorded by Tanda Rockers, MD on 06/19/2018 at 6:37 AM EDT Call patient : Study is unremarkable, no change in recs

## 2018-06-20 ENCOUNTER — Telehealth: Payer: Self-pay

## 2018-06-20 NOTE — Telephone Encounter (Signed)
Called and spoke with patient about providers response to her questions. She verbalized understanding and denied further questions at this time.

## 2018-06-20 NOTE — Telephone Encounter (Signed)
She is not getting enough synthroid therefore her thyroid stimulating hormone is elevated.  It is fine for her to wait for recheck in May. Glad to hear she got her new medication.

## 2018-06-20 NOTE — Telephone Encounter (Signed)
Please refer to lab result note dated 05/09/2018. Patient had been mailed a copy of labs with recommendations. Patient did not call to schedule 8 week lab appointment.  Called patient to verify she had received lab results and new medication. Patient stated she had was taking her new dosage of medication. Patient stated she was afraid right now to come into office with corona virus going around and did not want to schedule any appointments this soon. I ensured patient [facility] was taking the proper precautions in ensuring patient safety at this time and understood her concerns. Patient schedule lab appointment in May at this time.   Patient went on to ask what was going on with her Thyroid and would like provider to explain what high means. She would like to know why it is high. Routing questions to her provider for further explanation.

## 2018-07-09 ENCOUNTER — Other Ambulatory Visit: Payer: Self-pay | Admitting: Nurse Practitioner

## 2018-07-09 DIAGNOSIS — F419 Anxiety disorder, unspecified: Principal | ICD-10-CM

## 2018-07-09 DIAGNOSIS — F32A Depression, unspecified: Secondary | ICD-10-CM

## 2018-07-09 DIAGNOSIS — F329 Major depressive disorder, single episode, unspecified: Secondary | ICD-10-CM

## 2018-07-09 NOTE — Telephone Encounter (Signed)
Last filled 05/06/2018   [place] Database verified and compliance confirmed

## 2018-07-09 NOTE — Telephone Encounter (Signed)
Opened in error

## 2018-08-14 ENCOUNTER — Encounter: Payer: Self-pay | Admitting: Gastroenterology

## 2018-08-15 ENCOUNTER — Other Ambulatory Visit: Payer: Medicare Other

## 2018-08-16 ENCOUNTER — Other Ambulatory Visit: Payer: Medicare Other

## 2018-08-19 ENCOUNTER — Other Ambulatory Visit: Payer: Medicare Other

## 2018-08-19 ENCOUNTER — Other Ambulatory Visit: Payer: Self-pay

## 2018-08-19 ENCOUNTER — Other Ambulatory Visit: Payer: Self-pay | Admitting: Nurse Practitioner

## 2018-08-19 DIAGNOSIS — E038 Other specified hypothyroidism: Secondary | ICD-10-CM

## 2018-08-19 LAB — TSH: TSH: 0.77 mIU/L (ref 0.40–4.50)

## 2018-08-20 ENCOUNTER — Other Ambulatory Visit: Payer: Self-pay | Admitting: Nurse Practitioner

## 2018-08-20 ENCOUNTER — Encounter: Payer: Self-pay | Admitting: Gastroenterology

## 2018-08-20 DIAGNOSIS — F329 Major depressive disorder, single episode, unspecified: Secondary | ICD-10-CM

## 2018-08-20 DIAGNOSIS — F32A Depression, unspecified: Secondary | ICD-10-CM

## 2018-08-20 NOTE — Telephone Encounter (Signed)
Please see if she would be agreeable for tele-visit (she has expressed she did not want to come into office for appt previously due to [place])

## 2018-08-20 NOTE — Telephone Encounter (Signed)
Telephone visit scheduled for tomorrow

## 2018-08-20 NOTE — Telephone Encounter (Signed)
Patient requesting refill for alprazolam 0.5 mg tab. Last appt 05/06/2018. Verified correct pharmacy. Verified last refill in [place] data base 07/09/2018 for 60 tablets. Patient is not scheduled for a future appointment. Routing to provider for review and sign.

## 2018-08-21 ENCOUNTER — Other Ambulatory Visit: Payer: Self-pay

## 2018-08-21 ENCOUNTER — Encounter: Payer: Self-pay | Admitting: Nurse Practitioner

## 2018-08-21 ENCOUNTER — Ambulatory Visit (INDEPENDENT_AMBULATORY_CARE_PROVIDER_SITE_OTHER): Payer: Medicare Other | Admitting: Nurse Practitioner

## 2018-08-21 DIAGNOSIS — R2689 Other abnormalities of gait and mobility: Secondary | ICD-10-CM

## 2018-08-21 DIAGNOSIS — F411 Generalized anxiety disorder: Secondary | ICD-10-CM | POA: Diagnosis not present

## 2018-08-21 DIAGNOSIS — I10 Essential (primary) hypertension: Secondary | ICD-10-CM

## 2018-08-21 DIAGNOSIS — J449 Chronic obstructive pulmonary disease, unspecified: Secondary | ICD-10-CM

## 2018-08-21 DIAGNOSIS — E039 Hypothyroidism, unspecified: Secondary | ICD-10-CM

## 2018-08-21 DIAGNOSIS — F330 Major depressive disorder, recurrent, mild: Secondary | ICD-10-CM

## 2018-08-21 DIAGNOSIS — F17210 Nicotine dependence, cigarettes, uncomplicated: Secondary | ICD-10-CM

## 2018-08-21 NOTE — Progress Notes (Signed)
This service is provided via telemedicine  No vital signs collected/recorded due to the encounter was a telemedicine visit.   Location of patient (ex: home, work): Home  Patient consents to a telephone visit: Yes  Location of the provider (ex: office, home):  [facility], Office   Name of any referring provider:  N/A  Names of all persons participating in the telemedicine service and their role in the encounter:  S.Chrae B/CMA, Sherrie Mustache, NP, and Patient   Time spent on call:  9 min with medical assistant      Careteam: Patient Care Team: Lauree Chandler, NP as PCP - General (Geriatric Medicine)  Advanced Directive information Does Patient Have a Medical Advance Directive?: Yes, Type of Advance Directive: [place];Living will, Does patient want to make changes to medical advance directive?: No - Patient declined  Allergies  Allergen Reactions  . Cafergot   . Codeine   . Fenoprofen Calcium   . Nalfon [Fenoprofen]     Chief Complaint  Patient presents with  . Follow-up    Medication follow-up and discuss labs (patient to view on mychart). Moderate fall risk. Patient would like to discuss balance issues, possibly related to medications. Telephone visit   . Advanced Directive    Patient has HCPOA/[place] will, would like to make changes.   . Best Practice Recommendations    Discuss need for Hep C Screening      HPI: Patient is a 73 y.o. female for routine follow up.   Trying to stay inside due to her medical issues, worried about poor outcomes if she contracted COVID-19.   Depression/anxiety- not super depressed but not happy. Taking zoloft 100 mg by mouth daily decided not to take Wellbutrin due to being on zoloft. Continues on xanax BID PRN.  Osteoarthritis of hand/lumbago- will use tylenol but not often. Has not been bothered by this recently.   Hyperlipidemia- on lipitor. LDL 72 in oct 2019.  Hypothyroid- TSH now 0.77  previously 7.95 and synthroid was increased to 125 mcg  Smoker- continues to smoke, has not cut back, at home bored, will smoke when she does not even want one.   COPD- continues to smoke. Lives in old house with dust and cats and dogs. No worsening of symptoms. Continues on Symbicort, not needed albuterol every day  Hypertension- does not take blood pressure at home. Continues on losartan 50 mg daily   dexa scan and mammogram have been ordered but she has not scheduled.   Keeping colonoscopy next month.  Review of Systems:  Review of Systems  Constitutional: Negative for chills, fever and weight loss.  HENT: Negative for sore throat and tinnitus.   Respiratory: Positive for cough and wheezing. Negative for sputum production and shortness of breath.        Baseline pulmonary status  Cardiovascular: Negative for chest pain, palpitations and leg swelling.  Gastrointestinal: Negative for abdominal pain, constipation, diarrhea and heartburn.  Genitourinary: Negative for dysuria, frequency and urgency.  Musculoskeletal: Positive for back pain (backache occasionally) and joint pain (OA). Negative for falls and myalgias.  Skin: Negative.   Neurological: Negative for dizziness, tingling, sensory change, speech change and headaches.  Psychiatric/Behavioral: Positive for depression. Negative for memory loss. The patient does not have insomnia.     Past Medical History:  Diagnosis Date  . Chronic airway obstruction, not elsewhere classified   . Depressive disorder   . Depressive disorder, not elsewhere classified   . Emphysema   .  Enthesopathy of ankle and tarsus, unspecified   . HTN (hypertension)   . Hyperlipidemia   . Hypertension   . Hypopotassemia   . Leukocytosis   . Migraine, unspecified, without mention of intractable migraine without mention of status migrainosus   . Nonspecific (abnormal) findings on radiological and other examination of skull and head   . Osteoarthrosis,  unspecified whether generalized or localized, unspecified site   . Other emphysema ([place])   . Other specified disease of white blood cells   . Palpitations   . Tobacco abuse    Past Surgical History:  Procedure Laterality Date  . CESAREAN SECTION    . COLONOSCOPY  08/14/2008   Dr.. Delfin Edis   Social History:   reports that she has been smoking cigarettes. She has a 28.00 pack-year smoking history. She has never used smokeless tobacco. She reports that she does not drink alcohol or use drugs.  Family History  Problem Relation Age of Onset  . Alzheimer's disease Father   . Heart disease Father   . Diabetes Father   . Skin cancer Father   . Heart disease Mother   . Breast cancer Mother     Medications: Patient's Medications  New Prescriptions   No medications on file  Previous Medications   ALBUTEROL (PROAIR HFA) 108 (90 BASE) MCG/ACT INHALER    INHALE 1 TO 2 PUFF INTO THE LUNGS EVERY 6 HOURS AS NEEDED FOR WHEEZING OR SHORTNESS OF BREATH   ALPRAZOLAM (XANAX) 0.5 MG TABLET    TAKE 1 TABLET BY MOUTH TWICE DAILY AS NEEDED FOR NEVERVOUSNESS/SLEEP   ATORVASTATIN (LIPITOR) 40 MG TABLET    TAKE 1 TABLET BY MOUTH DAILY FOR HIGH CHOLESTEROL   BUDESONIDE-FORMOTEROL (SYMBICORT) 160-4.5 MCG/ACT INHALER    Take 2 puffs first thing in am and then another 2 puffs about 12 hours later.   CHOLECALCIFEROL (VITAMIN D3) 1000 UNITS CAPS    Take 1 capsule by mouth daily.     LEVOTHYROXINE (SYNTHROID, LEVOTHROID) 125 MCG TABLET    Take 1 tablet (125 mcg total) by mouth daily before breakfast.   LOSARTAN (COZAAR) 50 MG TABLET    Take 1 tablet (50 mg total) by mouth daily.   SERTRALINE (ZOLOFT) 100 MG TABLET    TAKE 1 TABLET BY MOUTH DAILY  Modified Medications   No medications on file  Discontinued Medications   BUPROPION (WELLBUTRIN XL) 150 MG 24 HR TABLET    Take 1 tablet (150 mg total) by mouth daily.     Physical Exam: unable due to tele-visit.     Labs reviewed: Basic Metabolic Panel:  Recent Labs    01/07/18 1011 05/06/18 1401 08/19/18 1131  NA 141 141  --   K 4.2 4.3  --   CL 103 103  --   CO2 32 32  --   GLUCOSE 86 90  --   BUN 9 12  --   CREATININE 0.79 0.81  --   CALCIUM 8.8 9.3  --   TSH 4.53* 7.95* 0.77   Liver Function Tests: Recent Labs    01/07/18 1011 05/06/18 1401  AST 14 21  ALT 14 18  BILITOT 0.5 0.5  PROT 6.2 6.8   No results for input(s): LIPASE, AMYLASE in the last 8760 hours. No results for input(s): AMMONIA in the last 8760 hours. CBC: Recent Labs    01/07/18 1011  WBC 9.9  NEUTROABS 6,425  HGB 14.4  HCT 43.6  MCV 92.0  PLT 250  Lipid Panel: Recent Labs    01/07/18 1011  CHOL 144  HDL 50*  LDLCALC 72  TRIG 134  CHOLHDL 2.9   TSH: Recent Labs    01/07/18 1011 05/06/18 1401 08/19/18 1131  TSH 4.53* 7.95* 0.77   A1C: Lab Results  Component Value Date   HGBA1C 5.9 (H) 06/17/2013     Assessment/Plan 1. Essential hypertension Stable at previous office visit. Will continue current regimen.  2. Anxiety state -ongoing, using xanax BID PRN but needs pretty routinely. Discussed this could be adding to balance issues but she does not wish to titrate or come off.  3. Balance problem Ongoing, discussed xanax but does not wish to reduce at this time.  4. Mild episode of recurrent major depressive disorder ([place]) Ongoing, willing to restart Wellbutrin at this time.   5. Hypothyroidism, unspecified type TSH at goal. Will continue synthroid at 125 mcg.   6. Cigarette smoker Ongoing, encouraged cessation.  7. COPD GOLD II/ still smoking  Stable, no significant worsening of COPD or recent exacerbations. Continue current regimen.  Next appt: 08/28/2018 Carlos American. Harle Battiest  [facility] [phone]    Virtual Visit via Telephone Note  I connected with@ on 08/21/18 at  3:45 PM EDT by telephone and verified that I am speaking with the correct person using two identifiers.   Location: Patient: home Provider: office   I discussed the limitations, risks, security and privacy concerns of performing an evaluation and management service by telephone and the availability of in person appointments. I also discussed with the patient that there may be a patient responsible charge related to this service. The patient expressed understanding and agreed to proceed.   I discussed the assessment and treatment plan with the patient. The patient was provided an opportunity to ask questions and all were answered. The patient agreed with the plan and demonstrated an understanding of the instructions.   The patient was advised to call back or seek an in-person evaluation if the symptoms worsen or if the condition fails to improve as anticipated.  I provided 22 minutes of non-face-to-face time during this encounter.  Carlos American. Harle Battiest Avs printed and mailed

## 2018-08-28 ENCOUNTER — Encounter: Payer: Self-pay | Admitting: Nurse Practitioner

## 2018-08-28 ENCOUNTER — Ambulatory Visit (INDEPENDENT_AMBULATORY_CARE_PROVIDER_SITE_OTHER): Payer: Medicare Other | Admitting: Nurse Practitioner

## 2018-08-28 ENCOUNTER — Other Ambulatory Visit: Payer: Self-pay

## 2018-08-28 DIAGNOSIS — Z720 Tobacco use: Secondary | ICD-10-CM | POA: Diagnosis not present

## 2018-08-28 DIAGNOSIS — Z0000 Encounter for general adult medical examination without abnormal findings: Secondary | ICD-10-CM

## 2018-08-28 MED ORDER — BUPROPION HCL ER (XL) 150 MG PO TB24: 150.0000 mg | ORAL_TABLET | Freq: Every day | ORAL | Status: DC

## 2018-08-28 MED ORDER — ZOSTER VAC RECOMB ADJUVANTED 50 MCG/0.5ML IM SUSR: 0.5000 mL | Freq: Once | INTRAMUSCULAR | 1 refills | Status: AC

## 2018-08-28 NOTE — Patient Instructions (Signed)
Gabrielle White , Thank you for taking time to come for your Medicare Wellness Visit. I appreciate your ongoing commitment to your health goals. Please review the following plan we discussed and let me know if I can assist you in the future.   Screening recommendations/referrals: Colonoscopy please schedule follow up  Mammogram: please schedule mamogram Bone Density: please schedule bone density  Recommended yearly ophthalmology/optometry visit for glaucoma screening and checkup Recommended yearly dental visit for hygiene and checkup  Vaccinations: Influenza vaccine due 11/2018 Pneumococcal vaccine up to date Tdap vaccine: will be due 01/04/2019 Shingles vaccine: follow up with pharmacy   Advanced directives: up date advanced directives and bring copy to office   Conditions/risks identified: fall risk, progressive lung disease with smoking.   Next appointment: 1 year for AWV    Preventive Care 24 Years and Older, Female Preventive care refers to lifestyle choices and visits with your health care provider that can promote health and wellness. What does preventive care include?  A yearly physical exam. This is also called an annual well check.  Dental exams once or twice a year.  Routine eye exams. Ask your health care provider how often you should have your eyes checked.  Personal lifestyle choices, including:  Daily care of your teeth and gums.  Regular physical activity.  Eating a healthy diet.  Avoiding tobacco and drug use.  Limiting alcohol use.  Practicing safe sex.  Taking low-dose aspirin every day.  Taking vitamin and mineral supplements as recommended by your health care provider. What happens during an annual well check? The services and screenings done by your health care provider during your annual well check will depend on your age, overall health, lifestyle risk factors, and family history of disease. Counseling  Your health care provider may ask you  questions about your:  Alcohol use.  Tobacco use.  Drug use.  Emotional well-being.  Home and relationship well-being.  Sexual activity.  Eating habits.  History of falls.  Memory and ability to understand (cognition).  Work and work Statistician.  Reproductive health. Screening  You may have the following tests or measurements:  Height, weight, and BMI.  Blood pressure.  Lipid and cholesterol levels. These may be checked every 5 years, or more frequently if you are over 74 years old.  Skin check.  Lung cancer screening. You may have this screening every year starting at age 74 if you have a 30-pack-year history of smoking and currently smoke or have quit within the past 15 years.  Fecal occult blood test (FOBT) of the stool. You may have this test every year starting at age 74.  Flexible sigmoidoscopy or colonoscopy. You may have a sigmoidoscopy every 5 years or a colonoscopy every 10 years starting at age 74.  Hepatitis C blood test.  Hepatitis B blood test.  Sexually transmitted disease (STD) testing.  Diabetes screening. This is done by checking your blood sugar (glucose) after you have not eaten for a while (fasting). You may have this done every 1-3 years.  Bone density scan. This is done to screen for osteoporosis. You may have this done starting at age 74.  Mammogram. This may be done every 74-2 years. Talk to your health care provider about how often you should have regular mammograms. Talk with your health care provider about your test results, treatment options, and if necessary, the need for more tests. Vaccines  Your health care provider may recommend certain vaccines, such as:  Influenza vaccine. This is  recommended every year.  Tetanus, diphtheria, and acellular pertussis (Tdap, Td) vaccine. You may need a Td booster every 10 years.  Zoster vaccine. You may need this after age 74.  Pneumococcal 13-valent conjugate (PCV13) vaccine. One dose is  recommended after age 74.  Pneumococcal polysaccharide (PPSV23) vaccine. One dose is recommended after age 74. Talk to your health care provider about which screenings and vaccines you need and how often you need them. This information is not intended to replace advice given to you by your health care provider. Make sure you discuss any questions you have with your health care provider. Document Released: 04/16/2015 Document Revised: 12/08/2015 Document Reviewed: 01/19/2015 Elsevier Interactive Patient Education  2017 [place] Prevention in the Home Falls can cause injuries. They can happen to people of all ages. There are many things you can do to make your home safe and to help prevent falls. What can I do on the outside of my home?  Regularly fix the edges of walkways and driveways and fix any cracks.  Remove anything that might make you trip as you walk through a door, such as a raised step or threshold.  Trim any bushes or trees on the path to your home.  Use bright outdoor lighting.  Clear any walking paths of anything that might make someone trip, such as rocks or tools.  Regularly check to see if handrails are loose or broken. Make sure that both sides of any steps have handrails.  Any raised decks and porches should have guardrails on the edges.  Have any leaves, snow, or ice cleared regularly.  Use sand or salt on walking paths during winter.  Clean up any spills in your garage right away. This includes oil or grease spills. What can I do in the bathroom?  Use night lights.  Install grab bars by the toilet and in the tub and shower. Do not use towel bars as grab bars.  Use non-skid mats or decals in the tub or shower.  If you need to sit down in the shower, use a plastic, non-slip stool.  Keep the floor dry. Clean up any water that spills on the floor as soon as it happens.  Remove soap buildup in the tub or shower regularly.  Attach bath mats  securely with double-sided non-slip rug tape.  Do not have throw rugs and other things on the floor that can make you trip. What can I do in the bedroom?  Use night lights.  Make sure that you have a light by your bed that is easy to reach.  Do not use any sheets or blankets that are too big for your bed. They should not hang down onto the floor.  Have a firm chair that has side arms. You can use this for support while you get dressed.  Do not have throw rugs and other things on the floor that can make you trip. What can I do in the kitchen?  Clean up any spills right away.  Avoid walking on wet floors.  Keep items that you use a lot in easy-to-reach places.  If you need to reach something above you, use a strong step stool that has a grab bar.  Keep electrical cords out of the way.  Do not use floor polish or wax that makes floors slippery. If you must use wax, use non-skid floor wax.  Do not have throw rugs and other things on the floor that can make you trip.  What can I do with my stairs?  Do not leave any items on the stairs.  Make sure that there are handrails on both sides of the stairs and use them. Fix handrails that are broken or loose. Make sure that handrails are as long as the stairways.  Check any carpeting to make sure that it is firmly attached to the stairs. Fix any carpet that is loose or worn.  Avoid having throw rugs at the top or bottom of the stairs. If you do have throw rugs, attach them to the floor with carpet tape.  Make sure that you have a light switch at the top of the stairs and the bottom of the stairs. If you do not have them, ask someone to add them for you. What else can I do to help prevent falls?  Wear shoes that:  Do not have high heels.  Have rubber bottoms.  Are comfortable and fit you well.  Are closed at the toe. Do not wear sandals.  If you use a stepladder:  Make sure that it is fully opened. Do not climb a closed  stepladder.  Make sure that both sides of the stepladder are locked into place.  Ask someone to hold it for you, if possible.  Clearly mark and make sure that you can see:  Any grab bars or handrails.  First and last steps.  Where the edge of each step is.  Use tools that help you move around (mobility aids) if they are needed. These include:  Canes.  Walkers.  Scooters.  Crutches.  Turn on the lights when you go into a dark area. Replace any light bulbs as soon as they burn out.  Set up your furniture so you have a clear path. Avoid moving your furniture around.  If any of your floors are uneven, fix them.  If there are any pets around you, be aware of where they are.  Review your medicines with your doctor. Some medicines can make you feel dizzy. This can increase your chance of falling. Ask your doctor what other things that you can do to help prevent falls. This information is not intended to replace advice given to you by your health care provider. Make sure you discuss any questions you have with your health care provider. Document Released: 01/14/2009 Document Revised: 08/26/2015 Document Reviewed: 04/24/2014 Elsevier Interactive Patient Education  2017 [employer].

## 2018-08-28 NOTE — Progress Notes (Signed)
Subjective:   Gabrielle White is a 73 y.o. female who presents for Medicare Annual (Subsequent) preventive examination.  Review of Systems:         Objective:     Vitals: There were no vitals taken for this visit.  There is no height or weight on file to calculate BMI.  Advanced Directives 08/28/2018 08/21/2018 05/06/2018 01/03/2018 03/19/2017 08/22/2016 08/02/2016  Does Patient Have a Medical Advance Directive? Yes Yes Yes;No No Yes Yes Yes  Type of Advance Directive Living will [place];Living will - - [place];Living will [place];Living will [place];Living will  Does patient want to make changes to medical advance directive? - No - Patient declined No - Patient declined - No - Patient declined - -  Copy of [place] in Chart? - No - copy requested - - No - copy requested Yes No - copy requested  Would patient like information on creating a medical advance directive? - - - - - - -    Tobacco Social History   Tobacco Use  Smoking Status Current Every Day Smoker  . Packs/day: 1.00  . Years: 28.00  . Pack years: 28.00  . Types: Cigarettes  Smokeless Tobacco Never Used     Ready to quit: Not Answered Counseling given: Not Answered   Clinical Intake:                       Past Medical History:  Diagnosis Date  . Chronic airway obstruction, not elsewhere classified   . Depressive disorder   . Depressive disorder, not elsewhere classified   . Emphysema   . Enthesopathy of ankle and tarsus, unspecified   . HTN (hypertension)   . Hyperlipidemia   . Hypertension   . Hypopotassemia   . Leukocytosis   . Migraine, unspecified, without mention of intractable migraine without mention of status migrainosus   . Nonspecific (abnormal) findings on radiological and other examination of skull and head   . Osteoarthrosis, unspecified whether generalized or localized,  unspecified site   . Other emphysema ([place])   . Other specified disease of white blood cells   . Palpitations   . Tobacco abuse    Past Surgical History:  Procedure Laterality Date  . CESAREAN SECTION    . COLONOSCOPY  08/14/2008   Dr.. Delfin Edis   Family History  Problem Relation Age of Onset  . Alzheimer's disease Father   . Heart disease Father   . Diabetes Father   . Skin cancer Father   . Heart disease Mother   . Breast cancer Mother    Social History   Socioeconomic History  . Marital status: Divorced    Spouse name: Not on file  . Number of children: 1  . Years of education: BA  . Highest education level: Not on file  Occupational History  . Occupation: retired Pharmacist, hospital  . Occupation: Physicist, medical  Social Needs  . Financial resource strain: Not hard at all  . Food insecurity:    Worry: Never true    Inability: Never true  . Transportation needs:    Medical: No    Non-medical: No  Tobacco Use  . Smoking status: Current Every Day Smoker    Packs/day: 1.00    Years: 28.00    Pack years: 28.00    Types: Cigarettes  . Smokeless tobacco: Never Used  Substance and Sexual Activity  .  Alcohol use: No    Alcohol/week: 0.0 standard drinks  . Drug use: No  . Sexual activity: Never  Lifestyle  . Physical activity:    Days per week: 3 days    Minutes per session: 20 min  . Stress: Only a little  Relationships  . Social connections:    Talks on phone: More than three times a week    Gets together: Once a week    Attends religious service: Never    Active member of club or organization: No    Attends meetings of clubs or organizations: Never    Relationship status: Divorced  Other Topics Concern  . Not on file  Social History Narrative   Drinks 2-3 cups of coffee a day     Outpatient Encounter Medications as of 08/28/2018  Medication Sig  . albuterol (PROAIR HFA) 108 (90 Base) MCG/ACT inhaler INHALE 1 TO 2 PUFF INTO THE LUNGS EVERY 6 HOURS AS NEEDED FOR  WHEEZING OR SHORTNESS OF BREATH  . ALPRAZolam (XANAX) 0.5 MG tablet TAKE 1 TABLET BY MOUTH TWICE DAILY AS NEEDED FOR NEVERVOUSNESS/SLEEP  . atorvastatin (LIPITOR) 40 MG tablet TAKE 1 TABLET BY MOUTH DAILY FOR HIGH CHOLESTEROL  . budesonide-formoterol (SYMBICORT) 160-4.5 MCG/ACT inhaler Take 2 puffs first thing in am and then another 2 puffs about 12 hours later.  . Cholecalciferol (VITAMIN D3) 1000 UNITS CAPS Take 1 capsule by mouth daily.    Marland Kitchen levothyroxine (SYNTHROID, LEVOTHROID) 125 MCG tablet Take 1 tablet (125 mcg total) by mouth daily before breakfast.  . losartan (COZAAR) 50 MG tablet Take 1 tablet (50 mg total) by mouth daily.  . sertraline (ZOLOFT) 100 MG tablet TAKE 1 TABLET BY MOUTH DAILY  . Zoster Vaccine Adjuvanted [facility]) injection Inject 0.5 mLs into the muscle once for 1 dose.  . [DISCONTINUED] Zoster Vaccine Adjuvanted [facility]) injection Inject 0.5 mLs into the muscle once.   No facility-administered encounter medications on file as of 08/28/2018.     Activities of Daily Living No flowsheet data found.  Patient Care Team: Lauree Chandler, NP as PCP - General (Geriatric Medicine)    Assessment:   This is a routine wellness examination for Gabrielle White.  Exercise Activities and Dietary recommendations    Goals    . Quit Smoking     Patient will try to quit smoking       Fall Risk Fall Risk  08/28/2018 08/21/2018 05/06/2018 01/03/2018 07/13/2017  Falls in the past year? 0 1 1 Yes No  Number falls in past yr: 0 0 0 2 or more -  Injury with Fall? 0 1 1 Yes -  Comment - Shattered wrist  - - -  Risk for fall due to : - History of fall(s);Impaired balance/gait - - -   Is the patient's home free of loose throw rugs in walkways, pet beds, electrical cords, etc?   no      Grab bars in the bathroom? yes      Handrails on the stairs?  No real stairs in house      Adequate lighting?   yes  Timed Get Up and Go performed: no  Depression Screen PHQ 2/9 Scores 08/28/2018  08/21/2018 05/06/2018 03/19/2017  PHQ - 2 Score 0 0 6 0  PHQ- 9 Score - - 6 -     Cognitive Function MMSE - Mini Mental State Exam 03/19/2017  Orientation to time 5  Orientation to Place 5  Registration 3  Attention/ Calculation 5  Recall  2  Language- name 2 objects 2  Language- repeat 1  Language- follow 3 step command 3  Language- read & follow direction 1  Write a sentence 1  Copy design 1  Total score 29     6CIT Screen 08/28/2018  What Year? 0 points  What month? 0 points  What time? 0 points  Count back from 20 0 points  Months in reverse 0 points  Repeat phrase 0 points  Total Score 0    Immunization History  Administered Date(s) Administered  . DTaP 04/04/2004  . Influenza Split 01/01/2013  . Influenza,inj,Quad PF,6+ Mos 01/08/2013, 12/17/2013, 03/10/2015, 03/06/2016, 05/06/2018  . Influenza,inj,quad, With Preservative 02/15/2017  . Influenza-Unspecified 02/20/2017  . Pneumococcal Conjugate-13 10/06/2015  . Pneumococcal Polysaccharide-23 04/11/2011  . Td 04/04/2004  . Zoster 01/12/2014    Qualifies for Shingles Vaccine?yes  Screening Tests Health Maintenance  Topic Date Due  . TETANUS/TDAP  01/04/2019 (Originally 04/04/2014)  . Hepatitis C Screening  08/21/2019 (Originally [birth year])  . INFLUENZA VACCINE  11/02/2018  . COLONOSCOPY  04/07/2019  . MAMMOGRAM  04/18/2019  . DEXA SCAN  Completed  . PNA vac Low Risk Adult  Completed    Cancer Screenings: Lung: Low Dose CT Chest recommended if Age 105-80 years, 30 pack-year currently smoking OR have quit w/in 15years. Patient does qualify. Breast:  Up to date on Mammogram? No   Up to date of Bone Density/Dexa? No Colorectal: appt scheduled in june  Additional Screenings:  Hepatitis C Screening: decline     Plan:     I have personally reviewed and noted the following in the patient's chart:   . Medical and social history . Use of alcohol, tobacco or illicit drugs  . Current medications and  supplements . Functional ability and status . Nutritional status . Physical activity . Advanced directives . List of other physician . Hospitalizations, surgeries, and ER visits in previous 12 months . Vitals . Screenings to include cognitive, depression, and falls . Referrals and appointments  In addition, I have reviewed and discussed with patient certain preventive protocols, quality mup tetrics, and best practice recommendations. A written personalized care plan for preventive services as well as general preventive health recommendations were provided to patient.     Lauree Chandler, NP  08/28/2018

## 2018-08-28 NOTE — Progress Notes (Signed)
    This service is provided via telemedicine  No vital signs collected/recorded due to the encounter was a telemedicine visit.   Location of patient (ex: home, work):  Home  Patient consents to a telephone visit:  Yes  Location of the provider (ex: office, home):  [facility], Office   Name of any referring provider: N/A  Names of all persons participating in the telemedicine service and their role in the encounter:  S.Chrae B/CMA, Jessica Eubanks, NP, and Patient   Time spent on call: 10 min with medical assistant   

## 2018-09-20 ENCOUNTER — Encounter: Payer: Medicare Other | Admitting: Gastroenterology

## 2018-09-26 ENCOUNTER — Telehealth: Payer: Self-pay | Admitting: Internal Medicine

## 2018-09-26 NOTE — Telephone Encounter (Signed)
This is becoming a standard of care in primary care and is reasonable based on:  Low-dose CT lung cancer screening is recommended for patients who are 74-74 years of age with a 30+ pack-year history of smoking, and who are currently smoking or quit <=15 years ago.   So she does qualify for the program -we do turn up a lot of false positives when we attempt to find cancer at its earliest stage and she needs to understand and accept this possibility.  If she still has question she can go through our shared decision-making process with Judson Roch groce prior to the procedure

## 2018-09-26 NOTE — Telephone Encounter (Signed)
Call returned to patient, made aware of MW recommendations. Voiced understanding. She states she will go ahead with the appt. Confirmed appt time and date. Nothing further is needed at this time.

## 2018-09-26 NOTE — Telephone Encounter (Signed)
Called and spoke with pt who stated after a recent visit with PCP Sherrie Mustache and after the visit, pt was told that she wanted pt to have a CT performed.  I did see that there was an order placed for pt to have a lung cancer screen CT which was ordered by Sherrie Mustache. Pt wanted to hear from MW if he felt like this was something that was necessary to be done. Pt does have a 1 pack a day 30 year smoking history.  Dr. Melvyn Novas, please advise if you agree with Sherrie Mustache that pt should have the CT performed as pt wants to have your opinion if this is necessary or not. Thank you!

## 2018-10-09 ENCOUNTER — Inpatient Hospital Stay: Admission: RE | Admit: 2018-10-09 | Payer: Medicare Other | Source: Ambulatory Visit

## 2018-10-09 ENCOUNTER — Other Ambulatory Visit: Payer: Self-pay | Admitting: Physician Assistant

## 2018-10-09 DIAGNOSIS — Q046 Congenital cerebral cysts: Secondary | ICD-10-CM

## 2018-10-31 ENCOUNTER — Ambulatory Visit (INDEPENDENT_AMBULATORY_CARE_PROVIDER_SITE_OTHER): Payer: Medicare Other | Admitting: Nurse Practitioner

## 2018-10-31 ENCOUNTER — Other Ambulatory Visit: Payer: Self-pay

## 2018-10-31 ENCOUNTER — Other Ambulatory Visit: Payer: Self-pay | Admitting: Nurse Practitioner

## 2018-10-31 ENCOUNTER — Encounter: Payer: Self-pay | Admitting: Nurse Practitioner

## 2018-10-31 VITALS — BP 110/72 | HR 91 | Temp 97.9°F | Ht 64.0 in | Wt 158.0 lb

## 2018-10-31 DIAGNOSIS — W19XXXA Unspecified fall, initial encounter: Secondary | ICD-10-CM

## 2018-10-31 DIAGNOSIS — L03113 Cellulitis of right upper limb: Secondary | ICD-10-CM | POA: Diagnosis not present

## 2018-10-31 DIAGNOSIS — S0033XA Contusion of nose, initial encounter: Secondary | ICD-10-CM | POA: Diagnosis not present

## 2018-10-31 MED ORDER — TETANUS-DIPHTHERIA TOXOIDS TD 2-2 LF/0.5ML IM SUSP: 0.5000 mL | Freq: Once | INTRAMUSCULAR | 0 refills | Status: AC

## 2018-10-31 MED ORDER — DOXYCYCLINE HYCLATE 100 MG PO TABS: 100.0000 mg | ORAL_TABLET | Freq: Two times a day (BID) | ORAL | 0 refills | Status: DC

## 2018-10-31 NOTE — Progress Notes (Signed)
Careteam: Patient Care Team: Lauree Chandler, NP as PCP - General (Geriatric Medicine)  Advanced Directive information    Allergies  Allergen Reactions  . Cafergot   . Codeine   . Fenoprofen Calcium   . Nalfon [Fenoprofen]     Chief Complaint  Patient presents with  . Acute Visit    Fall day before yesterday (moderate fall risk), tripped over mop bucket. Patient with skin tear on right arm and soreness on left arm.   . Medication Management    Stopped Wellbutrin after a few days, medication was ineffective      HPI: Patient is a 73 y.o. female seen in the office today after fall 2 days ago. She tripped over a mop bucket she left in the kitchen after cleaning and feel face first into the floor. Has a lot of clutter in her home and working on getting things out. Generally very careful.   Had a headache for a couple of minutes after fall but that went away. No LOC. No confusion, blurred vision.   Hit between her eyes/nose. Now with swelling to nose and under eyes, slightly red. Tender but not painful.  Had skin tear to right upper arm.   Nose bleed like crazy at first, for the first 12 hours had blood and mucous and then it stopped. Now just getting mucus out with slight blood. Left side of nose was very swollen but this has already improved. Did not apply ice.   Muscles in left arm sore but has full ROM and strength    Review of Systems:  Review of Systems  Constitutional: Negative for chills, fever and weight loss.  HENT: Positive for congestion and nosebleeds. Negative for sore throat and tinnitus.        Nose bleeds and congestion after fall  Respiratory: Positive for cough and wheezing. Negative for sputum production and shortness of breath.        Baseline pulmonary status  Cardiovascular: Negative for chest pain, palpitations and leg swelling.  Gastrointestinal: Negative for abdominal pain, constipation, diarrhea and heartburn.  Musculoskeletal: Positive  for back pain (backache occasionally), falls and joint pain (OA). Negative for myalgias.  Skin:       Skin tear to right upper arm   Neurological: Negative for dizziness, tingling, tremors, sensory change, speech change, focal weakness, seizures, loss of consciousness, weakness and headaches.    Past Medical History:  Diagnosis Date  . Chronic airway obstruction, not elsewhere classified   . Depressive disorder   . Depressive disorder, not elsewhere classified   . Emphysema   . Enthesopathy of ankle and tarsus, unspecified   . HTN (hypertension)   . Hyperlipidemia   . Hypertension   . Hypopotassemia   . Leukocytosis   . Migraine, unspecified, without mention of intractable migraine without mention of status migrainosus   . Nonspecific (abnormal) findings on radiological and other examination of skull and head   . Osteoarthrosis, unspecified whether generalized or localized, unspecified site   . Other emphysema ([place])   . Other specified disease of white blood cells   . Palpitations   . Tobacco abuse    Past Surgical History:  Procedure Laterality Date  . CESAREAN SECTION    . COLONOSCOPY  08/14/2008   Dr.. Delfin Edis   Social History:   reports that she has been smoking cigarettes. She has a 28.00 pack-year smoking history. She has never used smokeless tobacco. She reports that she does not drink alcohol  or use drugs.  Family History  Problem Relation Age of Onset  . Alzheimer's disease Father   . Heart disease Father   . Diabetes Father   . Skin cancer Father   . Heart disease Mother   . Breast cancer Mother     Medications: Patient's Medications  New Prescriptions   No medications on file  Previous Medications   ALBUTEROL (PROAIR HFA) 108 (90 BASE) MCG/ACT INHALER    INHALE 1 TO 2 PUFF INTO THE LUNGS EVERY 6 HOURS AS NEEDED FOR WHEEZING OR SHORTNESS OF BREATH   ALPRAZOLAM (XANAX) 0.5 MG TABLET    TAKE 1 TABLET BY MOUTH TWICE DAILY AS NEEDED FOR NEVERVOUSNESS/SLEEP    ATORVASTATIN (LIPITOR) 40 MG TABLET    TAKE 1 TABLET BY MOUTH DAILY FOR HIGH CHOLESTEROL   BUDESONIDE-FORMOTEROL (SYMBICORT) 160-4.5 MCG/ACT INHALER    Take 2 puffs first thing in am and then another 2 puffs about 12 hours later.   CHOLECALCIFEROL (VITAMIN D3) 1000 UNITS CAPS    Take 1 capsule by mouth daily.     LEVOTHYROXINE (SYNTHROID, LEVOTHROID) 125 MCG TABLET    Take 1 tablet (125 mcg total) by mouth daily before breakfast.   LOSARTAN (COZAAR) 50 MG TABLET    Take 1 tablet (50 mg total) by mouth daily.   SERTRALINE (ZOLOFT) 100 MG TABLET    TAKE 1 TABLET BY MOUTH DAILY  Modified Medications   No medications on file  Discontinued Medications   BUPROPION (WELLBUTRIN XL) 150 MG 24 HR TABLET    Take 1 tablet (150 mg total) by mouth daily.    Physical Exam:  Vitals:   10/31/18 1415  BP: 110/72  Pulse: 91  Temp: 97.9 F (36.6 C)  TempSrc: Oral  SpO2: 94%  Weight: 158 lb (71.7 kg)  Height: 5\' 4"  (1.626 m)   Body mass index is 27.12 kg/m. Wt Readings from Last 3 Encounters:  10/31/18 158 lb (71.7 kg)  06/10/18 161 lb 9.6 oz (73.3 kg)  05/06/18 160 lb (72.6 kg)    Physical Exam Constitutional:      General: She is not in acute distress.    Appearance: She is well-developed. She is not diaphoretic.  HENT:     Head: Normocephalic and atraumatic.     Comments: Slight Swelling and tenderness noted to nose and under eyes bilaterally with slight ecchymosis     Nose: Congestion present.  Eyes:     General:        Left eye: No discharge.     Conjunctiva/sclera: Conjunctivae normal.     Pupils: Pupils are equal, round, and reactive to light.     Comments: Corrective lenses.  Neck:     Musculoskeletal: Normal range of motion and neck supple.  Cardiovascular:     Rate and Rhythm: Normal rate and regular rhythm.     Heart sounds: Normal heart sounds. No murmur. No friction rub. No gallop.   Pulmonary:     Effort: Pulmonary effort is normal.     Breath sounds: Normal breath  sounds. No wheezing or rales.  Abdominal:     General: Bowel sounds are normal.     Palpations: Abdomen is soft.  Musculoskeletal: Normal range of motion.        General: No tenderness.  Skin:    General: Skin is warm and dry.     Coloration: Skin is not pale.     Comments: 8 cm x 3 cm oval skin tear to right upper  arm with surrounding erythema.    Neurological:     Mental Status: She is alert and oriented to person, place, and time.     Cranial Nerves: No cranial nerve deficit or facial asymmetry.     Sensory: Sensation is intact.     Motor: Motor function is intact.     Coordination: Coordination is intact. Coordination normal.     Gait: Gait is intact.  Psychiatric:        Behavior: Behavior normal.        Thought Content: Thought content normal.     Labs reviewed: Basic Metabolic Panel: Recent Labs    01/07/18 1011 05/06/18 1401 08/19/18 1131  NA 141 141  --   K 4.2 4.3  --   CL 103 103  --   CO2 32 32  --   GLUCOSE 86 90  --   BUN 9 12  --   CREATININE 0.79 0.81  --   CALCIUM 8.8 9.3  --   TSH 4.53* 7.95* 0.77   Liver Function Tests: Recent Labs    01/07/18 1011 05/06/18 1401  AST 14 21  ALT 14 18  BILITOT 0.5 0.5  PROT 6.2 6.8   No results for input(s): LIPASE, AMYLASE in the last 8760 hours. No results for input(s): AMMONIA in the last 8760 hours. CBC: Recent Labs    01/07/18 1011  WBC 9.9  NEUTROABS 6,425  HGB 14.4  HCT 43.6  MCV 92.0  PLT 250   Lipid Panel: Recent Labs    01/07/18 1011  CHOL 144  HDL 50*  LDLCALC 72  TRIG 134  CHOLHDL 2.9   TSH: Recent Labs    01/07/18 1011 05/06/18 1401 08/19/18 1131  TSH 4.53* 7.95* 0.77   A1C: Lab Results  Component Value Date   HGBA1C 5.9 (H) 06/17/2013     Assessment/Plan 1. Cellulitis of right upper extremity Skin tear noted to right upper arm after fall appears to be infected Xeroform dressing to skin tear every 3 days Cover with dressing - doxycycline (VIBRA-TABS) 100 MG  tablet; Take 1 tablet (100 mg total) by mouth 2 (two) times daily.  Dispense: 14 tablet; Refill: 0 -strict follow up instructions given  2. Contusion of nose, initial encounter -2 days since fall, she did not ice area, can use ice now if needed, she has mild swelling and little ecchymosis noted with tenderness to nose. Discussed work up of CT of head for further evaluation of contusion but she declines this at this time. Neuro exam WNL. Will monitor.   3. Fall, initial encounter -pt very cautious generally and does not leave things in walkways that are tripping hazards but left bucket after mopping. With skin tear and contusion to nose/face. Fall precautions discussed and reinforced.   4. Need for TD tetanus vaccine sent to pharmacy due fall with injury.   Next appt: 11/22/2018 Carlos American. [place], [facility] [phone]

## 2018-10-31 NOTE — Patient Instructions (Signed)
Xeroform dressing to skin tear every 3 days Cover with dressing  Doxycycline 100 mg by mouth twice daily for 1 week  Notify if redness worsens or drainage or fever occurs.   To get tetanus vaccine at pharmacy

## 2018-11-05 ENCOUNTER — Other Ambulatory Visit: Payer: Self-pay | Admitting: Nurse Practitioner

## 2018-11-06 ENCOUNTER — Telehealth: Payer: Self-pay | Admitting: *Deleted

## 2018-11-06 NOTE — Telephone Encounter (Signed)
Patient notified and agreed.  

## 2018-11-06 NOTE — Telephone Encounter (Signed)
They are xeroform and she will need to get this at the medical supply store, a Rx is not required

## 2018-11-06 NOTE — Telephone Encounter (Signed)
Patient called and stated that you told her to keep medicated pads on her right arm. Patient stated that she has ran out of the medicated pads and needs a Rx sent to her pharmacy. Stated that it is not healed yet. Patient wants to know if there is anything else she needs to be putting on it other than the medicated pads. Please Advise and send Rx.

## 2018-11-09 ENCOUNTER — Other Ambulatory Visit: Payer: Self-pay | Admitting: Internal Medicine

## 2018-11-09 DIAGNOSIS — J449 Chronic obstructive pulmonary disease, unspecified: Secondary | ICD-10-CM

## 2018-11-11 ENCOUNTER — Other Ambulatory Visit: Payer: Self-pay

## 2018-11-11 ENCOUNTER — Inpatient Hospital Stay: Admission: RE | Admit: 2018-11-11 | Payer: Medicare Other | Source: Ambulatory Visit

## 2018-11-11 ENCOUNTER — Ambulatory Visit
Admission: RE | Admit: 2018-11-11 | Discharge: 2018-11-11 | Disposition: A | Discharge status: Home / Self Care | Payer: Medicare Other | Source: Ambulatory Visit | Attending: Nurse Practitioner | Admitting: Nurse Practitioner

## 2018-11-11 DIAGNOSIS — Z720 Tobacco use: Secondary | ICD-10-CM

## 2018-11-12 ENCOUNTER — Encounter: Payer: Self-pay | Admitting: Nurse Practitioner

## 2018-11-12 DIAGNOSIS — J439 Emphysema, unspecified: Secondary | ICD-10-CM | POA: Insufficient documentation

## 2018-11-12 DIAGNOSIS — I700 Atherosclerosis of aorta: Secondary | ICD-10-CM | POA: Insufficient documentation

## 2018-11-22 ENCOUNTER — Other Ambulatory Visit: Payer: Self-pay

## 2018-11-22 ENCOUNTER — Ambulatory Visit (INDEPENDENT_AMBULATORY_CARE_PROVIDER_SITE_OTHER): Payer: Medicare Other | Admitting: Nurse Practitioner

## 2018-11-22 ENCOUNTER — Encounter: Payer: Self-pay | Admitting: Nurse Practitioner

## 2018-11-22 VITALS — BP 130/88 | HR 73 | Temp 98.0°F | Ht 64.0 in | Wt 158.4 lb

## 2018-11-22 DIAGNOSIS — Z720 Tobacco use: Secondary | ICD-10-CM | POA: Diagnosis not present

## 2018-11-22 DIAGNOSIS — L03113 Cellulitis of right upper limb: Secondary | ICD-10-CM | POA: Diagnosis not present

## 2018-11-22 DIAGNOSIS — R0789 Other chest pain: Secondary | ICD-10-CM

## 2018-11-22 DIAGNOSIS — F411 Generalized anxiety disorder: Secondary | ICD-10-CM

## 2018-11-22 DIAGNOSIS — Z23 Encounter for immunization: Secondary | ICD-10-CM | POA: Diagnosis not present

## 2018-11-22 DIAGNOSIS — Q046 Congenital cerebral cysts: Secondary | ICD-10-CM

## 2018-11-22 DIAGNOSIS — E785 Hyperlipidemia, unspecified: Secondary | ICD-10-CM

## 2018-11-22 DIAGNOSIS — I10 Essential (primary) hypertension: Secondary | ICD-10-CM

## 2018-11-22 DIAGNOSIS — J449 Chronic obstructive pulmonary disease, unspecified: Secondary | ICD-10-CM

## 2018-11-22 DIAGNOSIS — E039 Hypothyroidism, unspecified: Secondary | ICD-10-CM

## 2018-11-22 MED ORDER — LOSARTAN POTASSIUM 50 MG PO TABS: 50.0000 mg | ORAL_TABLET | Freq: Every day | ORAL | 1 refills | Status: DC

## 2018-11-22 NOTE — Progress Notes (Signed)
Careteam: Patient Care Team: Lauree Chandler, NP as PCP - General (Geriatric Medicine)  Advanced Directive information Does Patient Have a Medical Advance Directive?: Yes, Type of Advance Directive: Living will, Does patient want to make changes to medical advance directive?: No - Patient declined  Allergies  Allergen Reactions   Cafergot    Codeine    Fenoprofen Calcium    Nalfon [Fenoprofen]     Chief Complaint  Patient presents with   Medical Management of Chronic Issues    3 month follow-up. Extreme fatigue and no engery (ongoing concern). Moderate fall risk    Ear Fullness    Examine both ears   Results    Discuss CT results from 10/18/2018   Sleeping Problem    Patient c/o trouble falling asleep, tried melatonin.    Immunizations    Flu vaccine today    Headache    Patient c/o frequent headaches every other day on right frontal side of head.    General questions    Patient would like to know what factors effect oxygen in blood and what do B/P values mean.      HPI: Patient is a 73 y.o. female seen in the office today for routine follow up.   Getting headaches -right frontal last a few minutes and then goes away. Had CT head done by dr Kathyrn Sheriff in July of 2019, had questions regarding this but does not remember seeing him.  Having memory issues as well.   htn- stable; losartan   Insomnia- ongoing trouble sleeping. Worse since COVID. Has always been a night person. Does not do much during the day Not doing any exercise.   Anxiety- continues on zoloft and alprazolam. Anxiety is controlled.   COPD- symbicort BID, using albuterol as needed   Hypothyroid- continues on synthroid 125 mcg   Hyperlipidemia- continues on lipitor 40 mg daily   Smoking- continues to smoke "home alone bored"   Cataracts on left eye  Reports for about 1 month she experiences chest tightness on the left side, lasting about 30 secs when she is sitting and then it goes  away. Does not radiate and no associated symptoms.  Review of Systems:  Review of Systems  Constitutional: Positive for malaise/fatigue. Negative for chills, fever and weight loss.  HENT: Negative for sore throat and tinnitus.   Respiratory: Positive for cough and wheezing. Negative for sputum production and shortness of breath.        Baseline pulmonary status  Cardiovascular: Negative for chest pain, palpitations and leg swelling.  Gastrointestinal: Negative for abdominal pain, constipation, diarrhea and heartburn.  Genitourinary: Negative for dysuria, frequency and urgency.  Musculoskeletal: Positive for joint pain (OA). Negative for back pain, falls and myalgias.  Skin: Negative.   Neurological: Negative for dizziness, tingling, sensory change and speech change. Headaches: a few months ago.  Psychiatric/Behavioral: Positive for depression. Negative for memory loss. The patient does not have insomnia.     Past Medical History:  Diagnosis Date   Chronic airway obstruction, not elsewhere classified    Depressive disorder    Depressive disorder, not elsewhere classified    Emphysema    Enthesopathy of ankle and tarsus, unspecified    HTN (hypertension)    Hyperlipidemia    Hypertension    Hypopotassemia    Leukocytosis    Migraine, unspecified, without mention of intractable migraine without mention of status migrainosus    Nonspecific (abnormal) findings on radiological and other examination of skull and head  Osteoarthrosis, unspecified whether generalized or localized, unspecified site    Other emphysema ([place])    Other specified disease of white blood cells    Palpitations    [facility] spotted fever) 08/23/2015   Positive IgM. Treated with doxycycline.    Tobacco abuse    Past Surgical History:  Procedure Laterality Date   CESAREAN SECTION     COLONOSCOPY  08/14/2008   Dr.. Delfin Edis   Social History:   reports that she has been smoking  cigarettes. She has a 28.00 pack-year smoking history. She has never used smokeless tobacco. She reports that she does not drink alcohol or use drugs.  Family History  Problem Relation Age of Onset   Alzheimer's disease Father    Heart disease Father    Diabetes Father    Skin cancer Father    Heart disease Mother    Breast cancer Mother     Medications: Patient's Medications  New Prescriptions   No medications on file  Previous Medications   ALBUTEROL (PROAIR HFA) 108 (90 BASE) MCG/ACT INHALER    INHALE 1 TO 2 PUFF INTO THE LUNGS EVERY 6 HOURS AS NEEDED FOR WHEEZING OR SHORTNESS OF BREATH   ALPRAZOLAM (XANAX) 0.5 MG TABLET    TAKE 1 TABLET BY MOUTH TWICE DAILY AS NEEDED FOR NEVERVOUSNESS/SLEEP   ASPIRIN EC 81 MG TABLET    Take 81 mg by mouth daily.   ATORVASTATIN (LIPITOR) 40 MG TABLET    TAKE 1 TABLET BY MOUTH DAILY FOR HIGH CHOLESTEROL   BUDESONIDE-FORMOTEROL (SYMBICORT) 160-4.5 MCG/ACT INHALER    INHALE 2 PUFFS BY MOUTH EVERY MORNING AND THEN ANOTHER 2 PUFFS ABOUT 12 HOURS LATER   CHOLECALCIFEROL (VITAMIN D3) 1000 UNITS CAPS    Take 1 capsule by mouth daily.     LEVOTHYROXINE (SYNTHROID) 125 MCG TABLET    TAKE 1 TABLET BY MOUTH DAILY   LOSARTAN (COZAAR) 50 MG TABLET    Take 1 tablet (50 mg total) by mouth daily.   SERTRALINE (ZOLOFT) 100 MG TABLET    TAKE 1 TABLET BY MOUTH DAILY  Modified Medications   No medications on file  Discontinued Medications   DOXYCYCLINE (VIBRA-TABS) 100 MG TABLET    Take 1 tablet (100 mg total) by mouth 2 (two) times daily.    Physical Exam:  Vitals:   11/22/18 1422  BP: 130/88  Pulse: 73  Temp: 98 F (36.7 C)  TempSrc: Oral  SpO2: 94%  Weight: 158 lb 6.4 oz (71.8 kg)  Height: '5\' 4"'  (1.626 m)   Body mass index is 27.19 kg/m. Wt Readings from Last 3 Encounters:  11/22/18 158 lb 6.4 oz (71.8 kg)  10/31/18 158 lb (71.7 kg)  06/10/18 161 lb 9.6 oz (73.3 kg)    Physical Exam Constitutional:      General: She is not in acute  distress.    Appearance: She is well-developed. She is not diaphoretic.  HENT:     Head: Normocephalic and atraumatic.     Mouth/Throat:     Mouth: Mucous membranes are moist.     Pharynx: Oropharynx is clear.  Eyes:     General:        Left eye: No discharge.     Conjunctiva/sclera: Conjunctivae normal.     Pupils: Pupils are equal, round, and reactive to light.  Neck:     Musculoskeletal: Normal range of motion and neck supple.  Cardiovascular:     Rate and Rhythm: Normal rate and regular rhythm.  Heart sounds: Normal heart sounds. No murmur. No friction rub. No gallop.   Pulmonary:     Effort: Pulmonary effort is normal.     Breath sounds: Wheezing, rhonchi and rales present.     Comments: Wheezing and rhonchi throughout chest which is baseline Abdominal:     General: Bowel sounds are normal.     Palpations: Abdomen is soft.  Musculoskeletal: Normal range of motion.        General: No tenderness.  Skin:    General: Skin is warm and dry.     Coloration: Skin is not pale.     Findings: No erythema or rash.  Neurological:     Mental Status: She is alert and oriented to person, place, and time.     Cranial Nerves: No cranial nerve deficit or facial asymmetry.     Sensory: No sensory deficit.     Motor: No weakness.     Coordination: Coordination normal.     Gait: Gait normal.     Deep Tendon Reflexes: Reflexes normal.  Psychiatric:        Behavior: Behavior normal.        Thought Content: Thought content normal.     Labs reviewed: Basic Metabolic Panel: Recent Labs    01/07/18 1011 05/06/18 1401 08/19/18 1131  NA 141 141  --   K 4.2 4.3  --   CL 103 103  --   CO2 32 32  --   GLUCOSE 86 90  --   BUN 9 12  --   CREATININE 0.79 0.81  --   CALCIUM 8.8 9.3  --   TSH 4.53* 7.95* 0.77   Liver Function Tests: Recent Labs    01/07/18 1011 05/06/18 1401  AST 14 21  ALT 14 18  BILITOT 0.5 0.5  PROT 6.2 6.8   No results for input(s): LIPASE, AMYLASE in the  last 8760 hours. No results for input(s): AMMONIA in the last 8760 hours. CBC: Recent Labs    01/07/18 1011  WBC 9.9  NEUTROABS 6,425  HGB 14.4  HCT 43.6  MCV 92.0  PLT 250   Lipid Panel: Recent Labs    01/07/18 1011  CHOL 144  HDL 50*  LDLCALC 72  TRIG 134  CHOLHDL 2.9   TSH: Recent Labs    01/07/18 1011 05/06/18 1401 08/19/18 1131  TSH 4.53* 7.95* 0.77   A1C: Lab Results  Component Value Date   HGBA1C 5.9 (H) 06/17/2013     Assessment/Plan 1. Essential hypertension Controlled on current regimen. Continue medication with dietary modifications  - losartan (COZAAR) 50 MG tablet; Take 1 tablet (50 mg total) by mouth daily.  Dispense: 90 tablet; Refill: 1 - CMP with eGFR([facility]) - CBC with Differential/Platelet  2. Cellulitis of right upper extremity -resolved, wound has now healed.   3. Tobacco use Encouraged smoking cessation, information provided for this  4. Anxiety state Controlled on current regimen  5. Hypothyroidism, unspecified type -continues on synthroid  - TSH  6. Cyst, colloid, third ventricle (HCC) Due for yearly follow up, information provided for pt to call and make appt. Question if headaches coming from cyst, CT head is due.  7. Hyperlipidemia LDL goal <100 -continues on lipitor with dietary modifications. - CMP with eGFR([facility]) - Lipid Panel  8. Chest pressure -EKG atrial bigeminy noted, will follow up TSH, electrolytes and blood counts. Pt with monitor for any associated symptoms and notify if worsening symptoms occurs.   9. COPD still smoking -  stable, without recent exacerbation, some days requiring albuterol more than others, continues on Symbicort bid, encouraged cessation  10. Flu vaccine given  Next appt: 3 months, sooner if needed Saamiya Jeppsen K. [place], [facility] [phone]

## 2018-11-22 NOTE — Patient Instructions (Addendum)
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   Dr. Jairo Ben Phone: [phone] Neurosurgeon in [place], [place] Address: [address], [place], [place] [address]  Steps to Quit Smoking Smoking tobacco is the leading cause of preventable death. It can affect almost every organ in the body. Smoking puts you and those around you at risk for developing many serious chronic diseases. Quitting smoking can be difficult, but it is one of the best things that you can do for your health. It is never too late to quit. How do I get ready to quit? When you decide to quit smoking, create a plan to help you succeed. Before you quit:  Pick a date to quit. Set a date within the next 2 weeks to give you time to prepare.  Write down the reasons why you are quitting. Keep this list in places where you will see it often.  Tell your family, friends, and co-workers that you are quitting. Support from your loved ones can make quitting easier.  Talk with your health care provider about your options for quitting smoking.  Find out what treatment options are covered by your health insurance.  Identify people, places, things, and activities that make you want to smoke (triggers). Avoid them. What first steps can I take to quit smoking?  Throw away all cigarettes at home, at work, and in your car.  Throw away smoking accessories, such as Scientist, research (medical).  Clean your car. Make sure to empty the ashtray.  Clean your home, including curtains and carpets. What strategies can I use to quit smoking? Talk with your health care provider about combining strategies, such as taking medicines while you are also receiving in-person counseling. Using these two strategies together makes you more likely to succeed in quitting than if you used either strategy on its own.  If you are pregnant or breastfeeding, talk with your health care provider  about finding counseling or other support strategies to quit smoking. Do not take medicine to help you quit smoking unless your health care provider tells you to do so. To quit smoking: Quit right away  Quit smoking completely, instead of gradually reducing how much you smoke over a period of time. Research shows that stopping smoking right away is more successful than gradually quitting.  Attend in-person counseling to help you build problem-solving skills. You are more likely to succeed in quitting if you attend counseling sessions regularly. Even short sessions of 10 minutes can be effective. Take medicine You may take medicines to help you quit smoking. Some medicines require a prescription and some you can purchase over-the-counter. Medicines may have nicotine in them to replace the nicotine in cigarettes. Medicines may:  Help to stop cravings.  Help to relieve withdrawal symptoms. Your health care provider may recommend:  Nicotine patches, gum, or lozenges.  Nicotine inhalers or sprays.  Non-nicotine medicine that is taken by mouth. Find resources Find resources and support systems that can help you to quit smoking and remain smoke-free after you quit. These resources are most helpful when you use them often. They include:  Online chats with a Social worker.  Telephone quitlines.  Printed Furniture conservator/restorer.  Support groups or group counseling.  Text messaging programs.  Mobile phone apps or applications. Use apps that can help you stick to your quit plan by providing reminders, tips, and encouragement. There are many free apps for mobile  devices as well as websites. Examples include Quit Guide from the [employer] and smokefree.gov What things can I do to make it easier to quit?   Reach out to your family and friends for support and encouragement. Call telephone quitlines (1-800-QUIT-NOW), reach out to support groups, or work with a counselor for support.  Ask people who smoke to  avoid smoking around you.  Avoid places that trigger you to smoke, such as bars, parties, or smoke-break areas at work.  Spend time with people who do not smoke.  Lessen the stress in your life. Stress can be a smoking trigger for some people. To lessen stress, try: ? Exercising regularly. ? Doing deep-breathing exercises. ? Doing yoga. ? Meditating. ? Performing a body scan. This involves closing your eyes, scanning your body from head to toe, and noticing which parts of your body are particularly tense. Try to relax the muscles in those areas. How will I feel when I quit smoking? Day 1 to 3 weeks Within the first 24 hours of quitting smoking, you may start to feel withdrawal symptoms. These symptoms are usually most noticeable 2-3 days after quitting, but they usually do not last for more than 2-3 weeks. You may experience these symptoms:  Mood swings.  Restlessness, anxiety, or irritability.  Trouble concentrating.  Dizziness.  Strong cravings for sugary foods and nicotine.  Mild weight gain.  Constipation.  Nausea.  Coughing or a sore throat.  Changes in how the medicines that you take for unrelated issues work in your body.  Depression.  Trouble sleeping (insomnia). Week 3 and afterward After the first 2-3 weeks of quitting, you may start to notice more positive results, such as:  Improved sense of smell and taste.  Decreased coughing and sore throat.  Slower heart rate.  Lower blood pressure.  Clearer skin.  The ability to breathe more easily.  Fewer sick days. Quitting smoking can be very challenging. Do not get discouraged if you are not successful the first time. Some people need to make many attempts to quit before they achieve long-term success. Do your best to stick to your quit plan, and talk with your health care provider if you have any questions or concerns. Summary  Smoking tobacco is the leading cause of preventable death. Quitting smoking  is one of the best things that you can do for your health.  When you decide to quit smoking, create a plan to help you succeed.  Quit smoking right away, not slowly over a period of time.  When you start quitting, seek help from your health care provider, family, or friends. This information is not intended to replace advice given to you by your health care provider. Make sure you discuss any questions you have with your health care provider. Document Released: 03/14/2001 Document Revised: 06/07/2018 Document Reviewed: 06/08/2018 Elsevier Patient Education  2020 [employer].

## 2018-11-23 LAB — LIPID PANEL
Cholesterol: 171 mg/dL (ref <200)
HDL: 55 mg/dL (ref >=50)
LDL Cholesterol (Calc): 93 mg/dL (calc)
Non-HDL Cholesterol (Calc): 116 mg/dL (calc) (ref <130)
Total CHOL/HDL Ratio: 3.1 (calc) (ref <5.0)
Triglycerides: 136 mg/dL (ref <150)

## 2018-11-23 LAB — COMPLETE METABOLIC PANEL WITH GFR
AG Ratio: 1.7 (calc) (ref 1.0–2.5)
ALT: 11 U/L (ref 6–29)
AST: 12 U/L (ref 10–35)
Albumin: 4.2 g/dL (ref 3.6–5.1)
Alkaline phosphatase (APISO): 96 U/L (ref 37–153)
BUN: 11 mg/dL (ref 7–25)
CO2: 29 mmol/L (ref 20–32)
Calcium: 9.6 mg/dL (ref 8.6–10.4)
Chloride: 102 mmol/L (ref 98–110)
Creat: 0.79 mg/dL (ref 0.60–0.93)
GFR, Est African American: 85 mL/min/{1.73_m2} (ref >=60)
GFR, Est Non African American: 74 mL/min/{1.73_m2} (ref >=60)
Globulin: 2.5 g/dL (calc) (ref 1.9–3.7)
Glucose, Bld: 90 mg/dL (ref 65–139)
Potassium: 4.3 mmol/L (ref 3.5–5.3)
Sodium: 141 mmol/L (ref 135–146)
Total Bilirubin: 0.5 mg/dL (ref 0.2–1.2)
Total Protein: 6.7 g/dL (ref 6.1–8.1)

## 2018-11-23 LAB — CBC WITH DIFFERENTIAL/PLATELET
Absolute Monocytes: 518 {cells}/uL (ref 200–950)
Basophils Absolute: 67 {cells}/uL (ref 0–200)
Basophils Relative: 0.7 %
Eosinophils Absolute: 384 {cells}/uL (ref 15–500)
Eosinophils Relative: 4 %
HCT: 46.2 % — ABNORMAL HIGH (ref 35.0–45.0)
Hemoglobin: 15.4 g/dL (ref 11.7–15.5)
Lymphs Abs: 2400 {cells}/uL (ref 850–3900)
MCH: 30.9 pg (ref 27.0–33.0)
MCHC: 33.3 g/dL (ref 32.0–36.0)
MCV: 92.6 fL (ref 80.0–100.0)
MPV: 11.5 fL (ref 7.5–12.5)
Monocytes Relative: 5.4 %
Neutro Abs: 6230 {cells}/uL (ref 1500–7800)
Neutrophils Relative %: 64.9 %
Platelets: 283 Thousand/uL (ref 140–400)
RBC: 4.99 Million/uL (ref 3.80–5.10)
RDW: 13 % (ref 11.0–15.0)
Total Lymphocyte: 25 %
WBC: 9.6 Thousand/uL (ref 3.8–10.8)

## 2018-11-23 LAB — TSH: TSH: 3.08 mIU/L (ref 0.40–4.50)

## 2018-11-29 ENCOUNTER — Other Ambulatory Visit: Payer: Self-pay | Admitting: Nurse Practitioner

## 2018-12-02 ENCOUNTER — Ambulatory Visit
Admission: RE | Admit: 2018-12-02 | Discharge: 2018-12-02 | Disposition: A | Discharge status: Home / Self Care | Payer: Medicare Other | Source: Ambulatory Visit | Attending: Physician Assistant | Admitting: Physician Assistant

## 2018-12-02 ENCOUNTER — Other Ambulatory Visit: Payer: Self-pay

## 2018-12-02 DIAGNOSIS — Q046 Congenital cerebral cysts: Secondary | ICD-10-CM

## 2018-12-05 ENCOUNTER — Encounter: Payer: Self-pay | Admitting: Nurse Practitioner

## 2018-12-11 ENCOUNTER — Ambulatory Visit (INDEPENDENT_AMBULATORY_CARE_PROVIDER_SITE_OTHER): Payer: Medicare Other | Admitting: Internal Medicine

## 2018-12-11 ENCOUNTER — Encounter: Payer: Self-pay | Admitting: Internal Medicine

## 2018-12-11 ENCOUNTER — Other Ambulatory Visit: Payer: Self-pay

## 2018-12-11 DIAGNOSIS — J449 Chronic obstructive pulmonary disease, unspecified: Secondary | ICD-10-CM

## 2018-12-11 DIAGNOSIS — F17210 Nicotine dependence, cigarettes, uncomplicated: Secondary | ICD-10-CM | POA: Diagnosis not present

## 2018-12-11 MED ORDER — BUDESONIDE-FORMOTEROL FUMARATE 160-4.5 MCG/ACT IN AERO: 2.0000 | INHALATION_SPRAY | Freq: Two times a day (BID) | RESPIRATORY_TRACT | 0 refills | Status: DC

## 2018-12-11 NOTE — Assessment & Plan Note (Signed)
Counseled re importance of smoking cessation but did not meet time criteria for separate billing     I had an extended discussion with the patient reviewing all relevant studies completed to date and  lasting 15 to 20 minutes of a 25 minute visit    Each maintenance medication was reviewed in detail including most importantly the difference between maintenance and prns and under what circumstances the prns are to be triggered using an action plan format that is not reflected in the computer generated alphabetically organized AVS.     Please see AVS for specific instructions unique to this visit that I personally wrote and verbalized to the the pt in detail and then reviewed with pt  by my nurse highlighting any  changes in therapy recommended at today's visit to their plan of care.   

## 2018-12-11 NOTE — Patient Instructions (Addendum)
No change in medications, breathe clear air!   Please schedule a follow up office visit in 6 weeks, call sooner if needed

## 2018-12-11 NOTE — Progress Notes (Signed)
Subjective:   Patient ID: Gabrielle White, female    DOB: 1945-12-11 .   MRN: [id number]    Brief patient profile:  56  yowf active smoker with AB/GOLD 0 copd by pfts 03/08/15 and GOLD II criteria 11/28/2017     History of Present Illness  01/22/2015 acute extended re-establish ov/Gabrielle White re:  AB/ still smoking  Chief Complaint  Patient presents with  . Pulmonary Consult    pt last seen in 2014. pt states DR. Green wanted her to follow up. pt states somethines her throat feels like it closes up and she cant breath. pt states she feels like she has to take really deep breaths. pt c/o chest tightness, and prod cough white in color.pt c/o thrush she thiks may come from the inhailers.     Last better breathing  x 4 weeks prior to OV  p using prednisone but benefit only for a few days p stopped despite maint rx with symbicort (though hfa poor)  Nl routine is symbicort 160 2 bid and maybe ventolin and occ brovana no recent duoneb need  In retrospect really hasn't been able to really stay well x 4 y Cough is harsh and congested worse day than noct  rec Start Prednisone 10mg  Take 4 for two days three for two days two for two days one for two days  Plan A =  Automatic = symbicort 160 Take 2 puffs first thing in am and then another 2 puffs about 12 hours later.  Work on Interior and spatial designer: Plan B = Backup  Only use your albuterol  Plan C = crisis  Only use nebulizer iprotropium-albuterol (duoneb) if you try the ventolin first and it fails to help > ok to use up to every 4 hours  Plan D = doctor call us if not improving  Plan E = ER > go there if all else fails Try prilosec otc 20mg   Take 30-60 min before first meal of the day and Pepcid ac (famotidine) 20 mg one @  bedtime    GERD (REFLUX) diet         06/10/2018  f/u ov/Gabrielle White re:  GOLD II copd/ still smoking / maint on symb 160  Chief Complaint  Patient presents with  . Follow-up    Breathing is overall doing well today. She has  good days and bad days. She is using her albuterol inhaler 3 x per day on average.   Dyspnea:  MMRC1 = can walk nl pace, flat grade, can't hurry or go uphills or steps s sob   Cough: worse at home with animal exp Sleeping: on back  @ 30 degrees on couch due to breathing more comfortable but no pnd SABA use: up to 3 x daily  rec The key is to stop smoking completely before smoking completely stops you! -For smoking cessation classes call [phone]  Please remember to go to the lab department   for your tests - we will call you with the results when they are available.    12/11/2018  f/u ov/Gabrielle White re:  GOLD II copd/ still smoking maint on symb 160 2bid  Chief Complaint  Patient presents with  . Follow-up    Breathing has been worse- relates to hot weather. She has been using her proair 3 x daily on average.    Dyspnea:  Worse to .MMRC2 = can't walk a nl pace on a flat grade s sob but does fine slow and flat  Cough: sporadic  daytime / mucoid / more as day goes on  Sleeping: 30 degrees hob  sleeping better  SABA use: as above - none noct  02: none    No obvious day to day or daytime variability or assoc   purulent sputum or mucus plugs or hemoptysis or cp or chest tightness, subjective wheeze or overt sinus or hb symptoms.   Sleeping as above without nocturnal  or early am exacerbation  of respiratory  c/o's or need for noct saba. Also denies any obvious fluctuation of symptoms with weather or environmental changes or other aggravating or alleviating factors except as outlined above   No unusual exposure hx or h/o childhood pna/ asthma or knowledge of premature birth.  Current Allergies, Complete Past Medical History, Past Surgical History, Family History, and Social History were reviewed in [employer] [facility] record.  ROS  The following are not active complaints unless bolded Hoarseness, sore throat, dysphagia, dental problems, itching, sneezing,  nasal congestion or  discharge of excess mucus or purulent secretions, ear ache,   fever, chills, sweats, unintended wt loss or wt gain, classically pleuritic or exertional cp,  orthopnea pnd or arm/hand swelling  or leg swelling, presyncope, palpitations, abdominal pain, anorexia, nausea, vomiting, diarrhea  or change in bowel habits or change in bladder habits, change in stools or change in urine, dysuria, hematuria,  rash, arthralgias, visual complaints, headache, numbness, weakness or ataxia or problems with walking or coordination,  change in mood or  memory.        Current Meds  Medication Sig  . albuterol (PROAIR HFA) 108 (90 Base) MCG/ACT inhaler INHALE 1 TO 2 PUFF INTO THE LUNGS EVERY 6 HOURS AS NEEDED FOR WHEEZING OR SHORTNESS OF BREATH  . ALPRAZolam (XANAX) 0.5 MG tablet TAKE 1 TABLET BY MOUTH TWICE DAILY AS NEEDED FOR NEVERVOUSNESS/SLEEP  . aspirin EC 81 MG tablet Take 81 mg by mouth daily.  Marland Kitchen atorvastatin (LIPITOR) 40 MG tablet TAKE 1 TABLET BY MOUTH DAILY FOR HIGH CHOLESTEROL  . budesonide-formoterol (SYMBICORT) 160-4.5 MCG/ACT inhaler INHALE 2 PUFFS BY MOUTH EVERY MORNING AND THEN ANOTHER 2 PUFFS ABOUT 12 HOURS LATER  . Cholecalciferol (VITAMIN D3) 1000 UNITS CAPS Take 1 capsule by mouth daily.    Marland Kitchen levothyroxine (SYNTHROID) 125 MCG tablet TAKE 1 TABLET BY MOUTH DAILY  . losartan (COZAAR) 50 MG tablet Take 1 tablet (50 mg total) by mouth daily.  . sertraline (ZOLOFT) 100 MG tablet TAKE 1 TABLET BY MOUTH DAILY               Objective:   Physical Exam   amb wf nad/ very fatalistic  Vital signs reviewed - Note on arrival 02 sats  92% on RA      12/11/2018     157  01/22/2015      156 > 03/08/2015   162 > 06/07/2015  170 > 03/20/2016 160 > 05/15/2016   162 >   08/16/2016 163 > 11/20/2016  161 > 12/12/2016  162 > 05/23/2017  159 > 11/28/2017  159  > 03/11/2018  164 > 06/10/2018  161     01/16/13 156 lb (70.761 kg)  01/08/13 155 lb 3.2 oz (70.398 kg)  12/20/12 157 lb 9.6 oz (71.487 kg)         HEENT : pt  wearing mask not removed for exam due to covid -19 concerns.     NECK :  without JVD/Nodes/TM/ nl carotid upstrokes bilaterally   LUNGS: no acc muscle use,  Mild barrel  contour chest wall with bilateral  Distant bs s audible wheeze and  without cough on insp or exp maneuvers  and mild  Hyperresonant  to  percussion bilaterally     CV:  RRR  no s3 or murmur or increase in P2, and no edema   ABD:  soft and nontender with pos end  insp Hoover's  in the supine position. No bruits or organomegaly appreciated, bowel sounds nl  MS:   Nl gait/  ext warm without deformities, calf tenderness, cyanosis or clubbing No obvious joint restrictions   SKIN: warm and dry without lesions    NEURO:  alert, approp, nl sensorium with  no motor or cerebellar deficits apparent.           I personally reviewed images and agree with radiology impression as follows:   Chest CT = LDSCT  11/11/18 Lung-RADS 1, negative. Continue annual screening with low-dose chest CT without contrast in 12 months.      Assessment & Plan:

## 2018-12-11 NOTE — Assessment & Plan Note (Addendum)
Active smoker - PFTs  11/18/10  FEV1  2.0 (95%) ratio 73 and no change p saba and DLCO 75 corrects to 100% -PFT's  03/08/2015  FEV1 1.85 (78 % ) ratio 73  p 5 % improvement from saba with DLCO  63 % corrects to 72 % for alv volume and erv 17   - added flutter 06/07/2015  - PFT's  08/16/2016  FEV1 1.42 (61 % ) ratio 71  p 8 % improvement FEV1 (and 20% FVC) from saba p symb 160  prior to study with DLCO  61/58 % corrects to 78 % for alv volume    - Allergy profile 08/16/2016 >  Eos 0.5 /  IgE  99 RAST neg -  ADDED prednisone x 6 days cycles 11/20/2016  - 05/23/2017  After extensive coaching HFA effectiveness =    90% - PFT's  11/28/2017  FEV1 1.27 (57 % ) ratio 63  p 6 % improvement from saba p symb x 160 x 2  prior to study with DLCO  61 % corrects to 81  % for alv volume    - 06/10/2018  After extensive coaching inhaler device,  effectiveness =    90%  - alpha one AT screen 06/10/2018    MM level 141    Despite smoking has relatively well controlled symptoms x when exp to heat/ humidity but may be candidate for rx with lama either added on (triple rx in hfa form) or as lama/laba in hfa.   F/u in 6 weeks for trial of Breztri  Advised:  formulary restrictions will be an ongoing challenge for the forseable future and I would be happy to pick an alternative if the pt will first  provide me a list of them -  pt  will need to return here for training for any new device that is required eg dpi vs hfa vs respimat.    In the meantime we can always provide samples so that the patient never runs out of any needed respiratory medications.

## 2018-12-25 ENCOUNTER — Other Ambulatory Visit: Payer: Self-pay | Admitting: Nurse Practitioner

## 2018-12-25 DIAGNOSIS — F419 Anxiety disorder, unspecified: Secondary | ICD-10-CM

## 2018-12-25 DIAGNOSIS — F329 Major depressive disorder, single episode, unspecified: Secondary | ICD-10-CM

## 2018-12-25 NOTE — Telephone Encounter (Signed)
Rx last filled 08/03/2018 #60/2 refills. RX request sent to Lauree Chandler, NP to review [place] database and approve if necessary

## 2019-01-03 ENCOUNTER — Telehealth: Payer: Self-pay | Admitting: Internal Medicine

## 2019-01-03 NOTE — Telephone Encounter (Signed)
Call returned to patient, she is wanting to try the new medication that MW mentioned at her last OV. I made her aware per his documentation she was to trial Brezeti at her 6 week f/u because it will require teaching per MW:  F/u in 6 weeks for trial of Breztri  Advised:  formulary restrictions will be an ongoing challenge for the forseable future and I would be happy to pick an alternative if the pt will first  provide me a list of them -  pt  will need to return here for training for any new device that is required eg dpi vs hfa vs respimat.    Voiced understanding. Nothing further needed at this time.

## 2019-01-06 ENCOUNTER — Telehealth: Payer: Self-pay | Admitting: Internal Medicine

## 2019-01-06 NOTE — Telephone Encounter (Signed)
Called and spoke with pt. Pt inquired about new inhaler - Bretztri - that she states MW wanted to trial her on. She states she thought MW told her he wanted to go ahead and give this inhaler to her before her f/u appt; however, a nurse on Friday told her she needed to be seen first. I let her know it's a good rule of thumb to be seen first before starting a new inhaler so that she can be educated on proper technique and usage. Pt verbalized understanding and stated she would like to be seen sooner as her Symbicort sample is about to run out. We compromised on an appt for 01/15/2019 at 12:00 PM EDT. Appt has been scheduled. Nothing further needed at this time.

## 2019-01-15 ENCOUNTER — Ambulatory Visit: Payer: Medicare Other | Admitting: Internal Medicine

## 2019-01-22 ENCOUNTER — Ambulatory Visit: Payer: Medicare Other | Admitting: Internal Medicine

## 2019-01-24 ENCOUNTER — Other Ambulatory Visit: Payer: Self-pay

## 2019-01-24 ENCOUNTER — Encounter: Payer: Self-pay | Admitting: Internal Medicine

## 2019-01-24 ENCOUNTER — Ambulatory Visit: Payer: Medicare Other | Admitting: Internal Medicine

## 2019-01-24 DIAGNOSIS — J449 Chronic obstructive pulmonary disease, unspecified: Secondary | ICD-10-CM | POA: Diagnosis not present

## 2019-01-24 DIAGNOSIS — F17210 Nicotine dependence, cigarettes, uncomplicated: Secondary | ICD-10-CM

## 2019-01-24 MED ORDER — BREZTRI AEROSPHERE 160-9-4.8 MCG/ACT IN AERO: 2.0000 | INHALATION_SPRAY | Freq: Two times a day (BID) | RESPIRATORY_TRACT | 11 refills | Status: DC

## 2019-01-24 MED ORDER — BREZTRI AEROSPHERE 160-9-4.8 MCG/ACT IN AERO: 2.0000 | INHALATION_SPRAY | Freq: Two times a day (BID) | RESPIRATORY_TRACT | 0 refills | Status: DC

## 2019-01-24 MED ORDER — PREDNISONE 10 MG PO TABS: ORAL_TABLET | ORAL | 0 refills | Status: DC

## 2019-01-24 NOTE — Patient Instructions (Addendum)
Plan A = Automatic = Always=    Breztri Take 2 puffs first thing in am and then another 2 puffs about 12 hours later.   Work on inhaler technique:  relax and gently blow all the way out then take a nice smooth deep breath back in, triggering the inhaler at same time you start breathing in.  Hold for up to 5 seconds if you can. Blow out thru nose. Rinse and gargle with water when done      Plan B = Backup (to supplement plan A, not to replace it) Only use your albuterol inhaler as a rescue medication to be used if you can't catch your breath by resting or doing a relaxed purse lip breathing pattern.  - The less you use it, the better it will work when you need it. - Ok to use the inhaler up to 2 puffs  every 4 hours if you must but call for appointment if use goes up over your usual need - Don't leave home without it !!  (think of it like the spare tire for your car)   Prednisone 10 mg take  4 each am x 2 days,   2 each am x 2 days,  1 each am x 2 days and stop    Please schedule a follow up office visit in 6 months, call sooner if needed

## 2019-01-24 NOTE — Progress Notes (Signed)
Subjective:   Patient ID: Gabrielle White, female    DOB: [birth year] .   MRN: [id number]    Brief patient profile:  73  yowf  MM/ active smoker with AB/GOLD 0 copd by pfts 03/08/15 and GOLD II criteria 11/28/2017     History of Present Illness  01/22/2015 acute extended re-establish ov/Gabrielle White re:  AB/ still smoking  Chief Complaint  Patient presents with  . Pulmonary Consult    pt last seen in 2014. pt states DR. Green wanted her to follow up. pt states somethines her throat feels like it closes up and she cant breath. pt states she feels like she has to take really deep breaths. pt c/o chest tightness, and prod cough white in color.pt c/o thrush she thiks may come from the inhailers.     Last better breathing  x 4 weeks prior to OV  p using prednisone but benefit only for a few days p stopped despite maint rx with symbicort (though hfa poor)  Nl routine is symbicort 160 2 bid and maybe ventolin and occ brovana no recent duoneb need  In retrospect really hasn't been able to really stay well x 4 y Cough is harsh and congested worse day than noct  rec Start Prednisone 10mg  Take 4 for two days three for two days two for two days one for two days  Plan A =  Automatic = symbicort 160 Take 2 puffs first thing in am and then another 2 puffs about 12 hours later.  Work on Interior and spatial designer: Plan B = Backup  Only use your albuterol  Plan C = crisis  Only use nebulizer iprotropium-albuterol (duoneb) if you try the ventolin first and it fails to help > ok to use up to every 4 hours  Plan D = doctor call us if not improving  Plan E = ER > go there if all else fails Try prilosec otc 20mg   Take 30-60 min before first meal of the day and Pepcid ac (famotidine) 20 mg one @  bedtime    GERD (REFLUX) diet         06/10/2018  f/u ov/Gabrielle White re:  GOLD II copd/ still smoking / maint on symb 160  Chief Complaint  Patient presents with  . Follow-up    Breathing is overall doing well today. She  has good days and bad days. She is using her albuterol inhaler 3 x per day on average.   Dyspnea:  MMRC1 = can walk nl pace, flat grade, can't hurry or go uphills or steps s sob   Cough: worse at home with animal exp Sleeping: on back  @ 30 degrees on couch due to breathing more comfortable but no pnd SABA use: up to 3 x daily  rec The key is to stop smoking completely before smoking completely stops you! -For smoking cessation classes call [phone]  Please remember to go to the lab department   for your tests - we will call you with the results when they are available.    12/11/2018  f/u ov/Gabrielle White re:  GOLD II copd/ still smoking maint on symb 160 2bid  Chief Complaint  Patient presents with  . Follow-up    Breathing has been worse- relates to hot weather. She has been using her proair 3 x daily on average.   Dyspnea:  Worse to .MMRC2 = can't walk a nl pace on a flat grade s sob but does fine slow and flat  Cough:  sporadic daytime / mucoid / more as day goes on  Sleeping: 30 degrees hob  sleeping better  SABA use: as above - none noct  rec  no change   01/24/2019  f/u ov/Gabrielle White re: GOLD II/ still smoking maint rx symb 160 2bid  Chief Complaint  Patient presents with  . Follow-up    Breathing may be slightly better since the last visit- relates to cooler weather- using proair 2-3 x per day.   Dyspnea:  MMRC2 = can't walk a nl pace on a flat grade s sob but does fine slow and flat but by hour 10 wearing off symb and needs albuterol typically prior to pm dose of symb Cough: spells sporacic, white daytime mostly  Sleeping: sleeping on enough pillows to get 30 degrees  SABA use: as above  02: none    No obvious day to day or daytime variability or assoc excess/ purulent sputum or mucus plugs or hemoptysis or cp or chest tightness, subjective wheeze or overt sinus or hb symptoms.   Sleeping as above  without nocturnal  or early am exacerbation  of respiratory  c/o's or need for noct  saba. Also denies any obvious fluctuation of symptoms with weather or environmental changes or other aggravating or alleviating factors except as outlined above   No unusual exposure hx or h/o childhood pna/ asthma or knowledge of premature birth.  Current Allergies, Complete Past Medical History, Past Surgical History, Family History, and Social History were reviewed in [employer] [facility] record.  ROS  The following are not active complaints unless bolded Hoarseness, sore throat, dysphagia, dental problems, itching, sneezing,  nasal congestion or discharge of excess mucus or purulent secretions, ear ache,   fever, chills, sweats, unintended wt loss or wt gain, classically pleuritic or exertional cp,  orthopnea pnd or arm/hand swelling  or leg swelling, presyncope, palpitations, abdominal pain, anorexia, nausea, vomiting, diarrhea  or change in bowel habits or change in bladder habits, change in stools or change in urine, dysuria, hematuria,  Rash(? Flee bites) , arthralgias, visual complaints, headache, numbness, weakness or ataxia or problems with walking or coordination,  change in mood= more anxious or  memory.        Current Meds  Medication Sig  . albuterol (PROAIR HFA) 108 (90 Base) MCG/ACT inhaler INHALE 1 TO 2 PUFF INTO THE LUNGS EVERY 6 HOURS AS NEEDED FOR WHEEZING OR SHORTNESS OF BREATH  . ALPRAZolam (XANAX) 0.5 MG tablet TAKE 1 TABLET BY MOUTH TWICE DAILY AS NEEDED FOR NERVOUSNESS/SLEEP  . aspirin EC 81 MG tablet Take 81 mg by mouth daily.  Marland Kitchen atorvastatin (LIPITOR) 40 MG tablet TAKE 1 TABLET BY MOUTH DAILY FOR HIGH CHOLESTEROL  . budesonide-formoterol (SYMBICORT) 160-4.5 MCG/ACT inhaler INHALE 2 PUFFS BY MOUTH EVERY MORNING AND THEN ANOTHER 2 PUFFS ABOUT 12 HOURS LATER  . Cholecalciferol (VITAMIN D3) 1000 UNITS CAPS Take 1 capsule by mouth daily.    Marland Kitchen levothyroxine (SYNTHROID) 125 MCG tablet TAKE 1 TABLET BY MOUTH DAILY  . losartan (COZAAR) 50 MG tablet Take 1 tablet  (50 mg total) by mouth daily.  . sertraline (ZOLOFT) 100 MG tablet TAKE 1 TABLET BY MOUTH DAILY             Objective:   Physical Exam   amb wf nad   Vital signs reviewed - Note on arrival 02 sats  99% on RA       01/24/2019   156  12/11/2018  157  01/22/2015      156 > 03/08/2015   162 > 06/07/2015  170 > 03/20/2016 160 > 05/15/2016   162 >   08/16/2016 163 > 11/20/2016  161 > 12/12/2016  162 > 05/23/2017  159 > 11/28/2017  159  > 03/11/2018  164 > 06/10/2018  161     01/16/13 156 lb (70.761 kg)  01/08/13 155 lb 3.2 oz (70.398 kg)  12/20/12 157 lb 9.6 oz (71.487 kg)      HEENT : pt wearing mask not removed for exam due to covid - 19 concerns.    NECK :  without JVD/Nodes/TM/ nl carotid upstrokes bilaterally   LUNGS: no acc muscle use,  Mild barrel  contour chest wall with bilateral mid exp sonorous rhonchi   without cough on insp or exp maneuvers  and mild  Hyperresonant  to  percussion bilaterally     CV:  RRR  no s3 or murmur or increase in P2, and no edema   ABD:  soft and nontender with pos end  insp Hoover's  in the supine position. No bruits or organomegaly appreciated, bowel sounds nl  MS:   Nl gait/  ext warm without deformities, calf tenderness, cyanosis or clubbing No obvious joint restrictions   SKIN: warm and dry with what appear to be insect bites both LE's    NEURO:  alert, approp, nl sensorium with  no motor or cerebellar deficits apparent.                Assessment & Plan:

## 2019-01-26 ENCOUNTER — Encounter: Payer: Self-pay | Admitting: Internal Medicine

## 2019-01-26 NOTE — Assessment & Plan Note (Signed)
Active smoker - PFTs  11/18/10  FEV1  2.0 (95%) ratio 73 and no change p saba and DLCO 75 corrects to 100% -PFT's  03/08/2015  FEV1 1.85 (78 % ) ratio 73  p 5 % improvement from saba with DLCO  63 % corrects to 72 % for alv volume and erv 17   - added flutter 06/07/2015  - PFT's  08/16/2016  FEV1 1.42 (61 % ) ratio 71  p 8 % improvement FEV1 (and 20% FVC) from saba p symb 160  prior to study with DLCO  61/58 % corrects to 78 % for alv volume    - Allergy profile 08/16/2016 >  Eos 0.5 /  IgE  99 RAST neg -  ADDED prednisone x 6 days cycles 11/20/2016  - 05/23/2017  After extensive coaching HFA effectiveness =    90% - PFT's  11/28/2017  FEV1 1.27 (57 % ) ratio 63  p 6 % improvement from saba p symb x 160 x 2  prior to study with DLCO  61 % corrects to 81  % for alv volume   - alpha one AT screen 06/10/2018    MM level 141   - 01/24/2019  After extensive coaching inhaler device,  effectiveness =   90%      Group D in terms of symptom/risk and laba/lama/ICS  therefore appropriate rx at this point >>>  Try breztri plus 6 d predisone and work on stopping smoking (see separate a/p)    I had an extended discussion with the patient reviewing all relevant studies completed to date and  lasting 15 to 20 minutes of a 25 minute visit    I performed detailed device teaching using a teach back method which extended face to face time for this visit (see above)  Each maintenance medication was reviewed in detail including emphasizing most importantly the difference between maintenance and prns and under what circumstances the prns are to be triggered using an action plan format that is not reflected in the computer generated alphabetically organized AVS which I have not found useful in most complex patients, especially with respiratory illnesses  Please see AVS for specific instructions unique to this visit that I personally wrote and verbalized to the the pt in detail and then reviewed with pt  by my nurse highlighting  any  changes in therapy recommended at today's visit to their plan of care.

## 2019-01-26 NOTE — Assessment & Plan Note (Signed)
Counseled re importance of smoking cessation but did not meet time criteria for separate billing   °

## 2019-01-27 ENCOUNTER — Telehealth: Payer: Self-pay | Admitting: Internal Medicine

## 2019-01-27 DIAGNOSIS — J449 Chronic obstructive pulmonary disease, unspecified: Secondary | ICD-10-CM

## 2019-01-27 NOTE — Telephone Encounter (Signed)
Medication name and strength: Librarian, academic Provider: MW Pharmacy: [facility] on [address] Patient insurance ID:  Phone:  Fax:   Was the PA started on CMM?  Yes If yes, please enter the Key: [id number] Timeframe for approval/denial: Within 72hrs

## 2019-01-30 MED ORDER — BREZTRI AEROSPHERE 160-9-4.8 MCG/ACT IN AERO: 2.0000 | INHALATION_SPRAY | Freq: Two times a day (BID) | RESPIRATORY_TRACT | 0 refills | Status: DC

## 2019-01-30 NOTE — Telephone Encounter (Signed)
Ok to do bevespi x one month supply then ov to document benefit or lack thereof and move on to breztri

## 2019-01-30 NOTE — Telephone Encounter (Signed)
I called the patient back and advised of Dr. Gustavus Bryant response. Patient stated she was told the cost would be over $700.00 which she cannot afford.  Patient advised samples will be up front for pick up. Patient scheduled to see Dr. Melvyn Novas on 02/21/19 at 10:45.  Nothing further needed at this time.

## 2019-01-30 NOTE — Telephone Encounter (Signed)
PA for Gabrielle White was denied. Patient needs to try and fail two of the following: Advair or Wixela, Arcapta neohaler, Bevespi, Breo, Duaklir pressair, [employer], Serevent diskus, Spiriva, [place], Striverdi, [place] or [employer].   Per patient's chart, she has only tried and failed Brovana, Symbicort 80 &160 and Anoro.   MW, please advise if you want to appeal the denial or switch her to another medication. Thanks!

## 2019-02-21 ENCOUNTER — Encounter: Payer: Self-pay | Admitting: Internal Medicine

## 2019-02-21 ENCOUNTER — Telehealth: Payer: Self-pay | Admitting: Internal Medicine

## 2019-02-21 ENCOUNTER — Other Ambulatory Visit: Payer: Self-pay

## 2019-02-21 ENCOUNTER — Ambulatory Visit (INDEPENDENT_AMBULATORY_CARE_PROVIDER_SITE_OTHER): Payer: Medicare Other | Admitting: Internal Medicine

## 2019-02-21 DIAGNOSIS — F17210 Nicotine dependence, cigarettes, uncomplicated: Secondary | ICD-10-CM

## 2019-02-21 DIAGNOSIS — J449 Chronic obstructive pulmonary disease, unspecified: Secondary | ICD-10-CM

## 2019-02-21 MED ORDER — BEVESPI AEROSPHERE 9-4.8 MCG/ACT IN AERO: 2.0000 | INHALATION_SPRAY | Freq: Two times a day (BID) | RESPIRATORY_TRACT | 11 refills | Status: DC

## 2019-02-21 MED ORDER — BREZTRI AEROSPHERE 160-9-4.8 MCG/ACT IN AERO: 2.0000 | INHALATION_SPRAY | Freq: Two times a day (BID) | RESPIRATORY_TRACT | 0 refills | Status: DC

## 2019-02-21 NOTE — Progress Notes (Signed)
Subjective:   Patient ID: Gabrielle White, female    DOB: 1945/05/21 .   MRN: [id number]    Brief patient profile:  73  yowf  MM/ active smoker with AB/GOLD 0 copd by pfts 03/08/15 and GOLD II criteria 11/28/2017     History of Present Illness  01/22/2015 acute extended re-establish ov/Capricia Serda re:  AB/ still smoking  Chief Complaint  Patient presents with  . Pulmonary Consult    pt last seen in 2014. pt states DR. Green wanted her to follow up. pt states somethines her throat feels like it closes up and she cant breath. pt states she feels like she has to take really deep breaths. pt c/o chest tightness, and prod cough white in color.pt c/o thrush she thiks may come from the inhailers.     Last better breathing  x 4 weeks prior to OV  p using prednisone but benefit only for a few days p stopped despite maint rx with symbicort (though hfa poor)  Nl routine is symbicort 160 2 bid and maybe ventolin and occ brovana no recent duoneb need  In retrospect really hasn't been able to really stay well x 4 y Cough is harsh and congested worse day than noct  rec Start Prednisone 10mg  Take 4 for two days three for two days two for two days one for two days  Plan A =  Automatic = symbicort 160 Take 2 puffs first thing in am and then another 2 puffs about 12 hours later.  Work on Interior and spatial designer: Plan B = Backup  Only use your albuterol  Plan C = crisis  Only use nebulizer iprotropium-albuterol (duoneb) if you try the ventolin first and it fails to help > ok to use up to every 4 hours  Plan D = doctor call us if not improving  Plan E = ER > go there if all else fails Try prilosec otc 20mg   Take 30-60 min before first meal of the day and Pepcid ac (famotidine) 20 mg one @  bedtime    GERD (REFLUX) diet          no change     01/24/2019  f/u ov/Nazanin Kinner re: GOLD II/ still smoking maint rx symb 160 2bid  Chief Complaint  Patient presents with  . Follow-up    Breathing may be slightly  better since the last visit- relates to cooler weather- using proair 2-3 x per day.   Dyspnea:  MMRC2 = can't walk a nl pace on a flat grade s sob but does fine slow and flat but by hour 10 wearing off symb and needs albuterol typically prior to pm dose of symb Cough: spells sporacic, white daytime mostly  Sleeping: sleeping on enough pillows to get 30 degrees  SABA use: as above  rec Plan A = Automatic = Always=    Breztri Take 2 puffs first thing in am and then another 2 puffs about 12 hours later.  Work on inhaler technique:    Plan B = Backup (to supplement plan A, not to replace it) Only use your albuterol inhaler as a rescue medication   Prednisone 10 mg take  4 each am x 2 days,   2 each am x 2 days,  1 each am x 2 days and stop  Please schedule a follow up office visit in 6 months, call sooner if needed     02/21/2019  f/u ov/Vinicio Lynk re: GOLD II/ still smoking/ problems with access  to affordable meds / does not have pred refillable as Plan C Chief Complaint  Patient presents with  . Follow-up    Patient is here for follow up for COPD. Insurance denied [employer].  Dyspnea:  Walking end of road and back now x 15-20 min s stopping / slow pace = MMRC2 = can't walk a nl pace on a flat grade s sob but does fine slow and flat  Cough: some sporadic esp in am / minimal mucoid sputum   Sleeping: no resp problems SABA use: avg twice daily at most, less while on breztri   No obvious day to day or daytime variability or assoc   purulent sputum or mucus plugs or hemoptysis or cp or chest tightness, subjective wheeze or overt sinus or hb symptoms.   Sleeps  without nocturnal    exacerbation  of respiratory  c/o's or need for noct saba. Also denies any obvious fluctuation of symptoms with weather or environmental changes or other aggravating or alleviating factors except as outlined above.  No unusual exposure hx or h/o childhood pna/ asthma or knowledge of premature birth.  Current Allergies,  Complete Past Medical History, Past Surgical History, Family History, and Social History were reviewed in [employer] [facility] record.  ROS  The following are not active complaints unless bolded Hoarseness, sore throat, dysphagia, dental problems, itching, sneezing,  nasal congestion or discharge of excess mucus or purulent secretions, ear ache,   fever, chills, sweats, unintended wt loss or wt gain, classically pleuritic or exertional cp,  orthopnea pnd or arm/hand swelling  or leg swelling, presyncope, palpitations, abdominal pain, anorexia, nausea, vomiting, diarrhea  or change in bowel habits or change in bladder habits, change in stools or change in urine, dysuria, hematuria,  rash, arthralgias, visual complaints, headache, numbness, weakness or ataxia or problems with walking or coordination,  change in mood or  memory.        Current Meds  Medication Sig  . albuterol (PROAIR HFA) 108 (90 Base) MCG/ACT inhaler INHALE 1 TO 2 PUFF INTO THE LUNGS EVERY 6 HOURS AS NEEDED FOR WHEEZING OR SHORTNESS OF BREATH  . ALPRAZolam (XANAX) 0.5 MG tablet TAKE 1 TABLET BY MOUTH TWICE DAILY AS NEEDED FOR NERVOUSNESS/SLEEP  . aspirin EC 81 MG tablet Take 81 mg by mouth daily.  Marland Kitchen atorvastatin (LIPITOR) 40 MG tablet TAKE 1 TABLET BY MOUTH DAILY FOR HIGH CHOLESTEROL  . Budeson-Glycopyrrol-Formoterol (BREZTRI AEROSPHERE) 160-9-4.8 MCG/ACT AERO Inhale 2 puffs into the lungs 2 (two) times daily.  . Budeson-Glycopyrrol-Formoterol (BREZTRI AEROSPHERE) 160-9-4.8 MCG/ACT AERO Inhale 2 puffs into the lungs 2 (two) times daily.  . Cholecalciferol (VITAMIN D3) 1000 UNITS CAPS Take 1 capsule by mouth daily.    Marland Kitchen levothyroxine (SYNTHROID) 125 MCG tablet TAKE 1 TABLET BY MOUTH DAILY  . losartan (COZAAR) 50 MG tablet Take 1 tablet (50 mg total) by mouth daily.  . predniSONE (DELTASONE) 10 MG tablet Take  4 each am x 2 days,   2 each am x 2 days,  1 each am x 2 days and stop  . sertraline (ZOLOFT) 100 MG tablet TAKE  1 TABLET BY MOUTH DAILY           Objective:   Physical Exam   amb wf nad    02/21/2019  154  01/24/2019   156  12/11/2018       157  01/22/2015      156 > 03/08/2015   162 > 06/07/2015  170 > 03/20/2016 160 >  05/15/2016   162 >   08/16/2016 163 > 11/20/2016  161 > 12/12/2016  162 > 05/23/2017  159 > 11/28/2017  159  > 03/11/2018  164 > 06/10/2018  161     01/16/13 156 lb (70.761 kg)  01/08/13 155 lb 3.2 oz (70.398 kg)  12/20/12 157 lb 9.6 oz (71.487 kg)     Vital signs reviewed - Note on arrival 02 sats  92% on RA     HEENT : pt wearing mask not removed for exam due to covid - 19 concerns.    NECK :  without JVD/Nodes/TM/ nl carotid upstrokes bilaterally   LUNGS: no acc muscle use,  Mild barrel  contour chest wall with bilateral  Distant bs s audible wheeze and  without cough on insp or exp maneuvers  and mild  Hyperresonant  to  percussion bilaterally     CV:  RRR  no s3 or murmur or increase in P2, and no edema   ABD:  soft and nontender with pos end  insp Hoover's  in the supine position. No bruits or organomegaly appreciated, bowel sounds nl  MS:   Nl gait/  ext warm without deformities, calf tenderness, cyanosis or clubbing No obvious joint restrictions   SKIN: warm and dry without lesions    NEURO:  alert, approp, nl sensorium with  no motor or cerebellar deficits apparent.        I personally reviewed images and agree with radiology impression as follows:   Chest LDSCT  11/11/2018 Mild centrilobular emphysema. Lingular and right lower lobe scarring. No suspicious pulmonary nodule or mass.        Assessment & Plan:

## 2019-02-21 NOTE — Patient Instructions (Addendum)
Plan A = Automatic = Always=    Breztri = Bevespi (one or the other)  Take 2 puffs first thing in am and then another 2 puffs about 12 hours later.      Plan B = Backup (to supplement plan A, not to replace it) Only use your albuterol inhaler as a rescue medication to be used if you can't catch your breath by resting or doing a relaxed purse lip breathing pattern.  - The less you use it, the better it will work when you need it. - Ok to use the inhaler up to 2 puffs  every 4 hours if you must but call for appointment if use goes up over your usual need - Don't leave home without it !!  (think of it like the spare tire for your car)   Add Plan C: crisis If not improving or needing more albuterol than usual : Prednisone 10 mg take  4 each am x 2 days,   2 each am x 2 days,  1 each am x 2 days and stop   The key is to stop smoking completely before smoking completely stops you!     Please schedule a follow up visit in 6 months but call sooner if needed

## 2019-02-21 NOTE — Telephone Encounter (Signed)
lmtcb for pt. Called pharmacy who verified that pt does need a PA for Bevespi.  Preferred alternatives are Stiolto and Anoro.  Pharmacist is faxing PA form to our office.   Per Epic, it looks like pt previously took Anoro in 2016 (prescribed by Dr. Jeanmarie Hubert) and has never taken [place].  MW please advise if you want to switch pt to [employer] per insurance preference, or perform PA for Bevespi based on previous trial of Anoro.  Thanks!

## 2019-02-22 NOTE — Telephone Encounter (Signed)
She can certainly try anoro and see if losing ground on it or   return so I can teach her Stiolto which is closer to [place] but requires more teaching to use correctly -  She has some samples to give her time to get back here so that's probably what I would do unless she has a favorable memory of anoro's benefits

## 2019-02-23 ENCOUNTER — Encounter: Payer: Self-pay | Admitting: Internal Medicine

## 2019-02-23 MED ORDER — PREDNISONE 10 MG PO TABS: ORAL_TABLET | ORAL | 11 refills | Status: DC

## 2019-02-23 NOTE — Assessment & Plan Note (Signed)
Active smoker - PFTs  11/18/10  FEV1  2.0 (95%) ratio 73 and no change p saba and DLCO 75 corrects to 100% -PFT's  03/08/2015  FEV1 1.85 (78 % ) ratio 73  p 5 % improvement from saba with DLCO  63 % corrects to 72 % for alv volume and erv 17   - added flutter 06/07/2015  - PFT's  08/16/2016  FEV1 1.42 (61 % ) ratio 71  p 8 % improvement FEV1 (and 20% FVC) from saba p symb 160  prior to study with DLCO  61/58 % corrects to 78 % for alv volume    - Allergy profile 08/16/2016 >  Eos 0.5 /  IgE  99 RAST neg -  ADDED prednisone x 6 days cycles 11/20/2016  - PFT's  11/28/2017  FEV1 1.27 (57 % ) ratio 63  p 6 % improvement from saba p symb x 160 x 2  prior to study with DLCO  61 % corrects to 81  % for alv volume   - alpha one AT screen 06/10/2018    MM level 141     01/24/2019  After extensive coaching inhaler device,  effectiveness =   90% > try Breztri 2bid and pred x 6 days > much better 02/21/2019  but can't afford it so changed to bevespi plus prn pred for flares   Advised:  formulary restrictions will be an ongoing challenge for the forseable future and I would be happy to pick an alternative if the pt will first  provide me a list of them -  pt  will need to return here for training for any new device that is required eg dpi vs hfa vs respimat.    In the meantime we can always provide samples so that the patient never runs out of any needed respiratory medications.   Active smoking with tendency to flares plus MMRC2 doe so best choice for now = bevespi plus prn pred until [place] covered in the new year.

## 2019-02-23 NOTE — Assessment & Plan Note (Signed)
Counseled re importance of smoking cessation but did not meet time criteria for separate billing     I had an extended discussion with the patient reviewing all relevant studies completed to date and  lasting 15 to 20 minutes of a 25 minute visit    Each maintenance medication was reviewed in detail including most importantly the difference between maintenance and prns and under what circumstances the prns are to be triggered using an action plan format that is not reflected in the computer generated alphabetically organized AVS.     Please see AVS for specific instructions unique to this visit that I personally wrote and verbalized to the the pt in detail and then reviewed with pt  by my nurse highlighting any  changes in therapy recommended at today's visit to their plan of care.   

## 2019-02-24 ENCOUNTER — Other Ambulatory Visit: Payer: Self-pay

## 2019-02-24 ENCOUNTER — Encounter: Payer: Self-pay | Admitting: Nurse Practitioner

## 2019-02-24 ENCOUNTER — Ambulatory Visit (INDEPENDENT_AMBULATORY_CARE_PROVIDER_SITE_OTHER): Payer: Medicare Other | Admitting: Nurse Practitioner

## 2019-02-24 DIAGNOSIS — E039 Hypothyroidism, unspecified: Secondary | ICD-10-CM | POA: Diagnosis not present

## 2019-02-24 DIAGNOSIS — F329 Major depressive disorder, single episode, unspecified: Secondary | ICD-10-CM

## 2019-02-24 DIAGNOSIS — F419 Anxiety disorder, unspecified: Secondary | ICD-10-CM | POA: Diagnosis not present

## 2019-02-24 DIAGNOSIS — J449 Chronic obstructive pulmonary disease, unspecified: Secondary | ICD-10-CM

## 2019-02-24 DIAGNOSIS — Q046 Congenital cerebral cysts: Secondary | ICD-10-CM | POA: Diagnosis not present

## 2019-02-24 DIAGNOSIS — I10 Essential (primary) hypertension: Secondary | ICD-10-CM

## 2019-02-24 DIAGNOSIS — F17210 Nicotine dependence, cigarettes, uncomplicated: Secondary | ICD-10-CM

## 2019-02-24 DIAGNOSIS — E785 Hyperlipidemia, unspecified: Secondary | ICD-10-CM

## 2019-02-24 MED ORDER — ANORO ELLIPTA 62.5-25 MCG/INH IN AEPB: 1.0000 | INHALATION_SPRAY | Freq: Every day | RESPIRATORY_TRACT | 11 refills | Status: DC

## 2019-02-24 NOTE — Progress Notes (Signed)
This service is provided via telemedicine  No vital signs collected/recorded due to the encounter was a telemedicine visit.   Location of patient (ex: home, work): Home  Patient consents to a telephone visit:  Yes   Location of the provider (ex: office, home):  [facility], Office   Name of any referring provider:  N/A  Names of all persons participating in the telemedicine service and their role in the encounter: S.Chrae B/CMA, Sherrie Mustache, NP, and Patient   Time spent on call:  6 min with medical assistant       Careteam: Patient Care Team: Lauree Chandler, NP as PCP - General (Geriatric Medicine)  Advanced Directive information Does Patient Have a Medical Advance Directive?: Yes, Type of Advance Directive: [place];Living will, Does patient want to make changes to medical advance directive?: No - Patient declined  Allergies  Allergen Reactions  . Cafergot   . Codeine   . Fenoprofen Calcium   . Nalfon [Fenoprofen]     Chief Complaint  Patient presents with  . Medical Management of Chronic Issues    3 month follow-up. Telephone visit      HPI: Patient is a 73 y.o. female via facetime video visit for 3 month follow up.  Got her influenza vaccine at her pharmacy.  Anxiety and depression- more anxiety due to being in the process of moving, going to rent her son's town home and depressed that she is having to move. House is falling apart she plans to foreclose and that has her down. Previously with perfect credit and this will be ruined. Unable to sell her home. Having to put her dogs down (had to put one down a few months ago and the other is not doing well).   Continues to smoke but will not be allowed to do this in her son townhouse -- reports this is a good change, cause her to cut back   htn- generally controlled, continues on losartan  COPD-seeing pulmonary, medication she was on was awesome, hardly had to used albuterol.  Started on anoro today due to cost.   Hyperlipidemia- currently on lipitor   Colloid cyst- followed with neurosurgery- unchanged.  Review of Systems:  Review of Systems  Constitutional: Negative for chills, fever and weight loss.  HENT: Negative for hearing loss.   Respiratory: Positive for cough, sputum production and shortness of breath.        Stable at this time.  Cardiovascular: Negative for chest pain, palpitations and leg swelling.  Gastrointestinal: Negative for abdominal pain, constipation, diarrhea and heartburn.  Genitourinary: Negative for dysuria, frequency and urgency.       Urinary leakage.   Musculoskeletal: Negative for back pain, falls, joint pain and myalgias.  Skin: Negative.   Neurological: Negative for dizziness, tingling and headaches.  Psychiatric/Behavioral: Positive for depression. Negative for memory loss. The patient is nervous/anxious. The patient does not have insomnia.     Past Medical History:  Diagnosis Date  . Chronic airway obstruction, not elsewhere classified   . Depressive disorder   . Depressive disorder, not elsewhere classified   . Emphysema   . Enthesopathy of ankle and tarsus, unspecified   . HTN (hypertension)   . Hyperlipidemia   . Hypertension   . Hypopotassemia   . Leukocytosis   . Migraine, unspecified, without mention of intractable migraine without mention of status migrainosus   . Nonspecific (abnormal) findings on radiological and other examination of skull and head   .  Osteoarthrosis, unspecified whether generalized or localized, unspecified site   . Other emphysema ([place])   . Other specified disease of white blood cells   . Palpitations   . [facility] spotted fever) 08/23/2015   Positive IgM. Treated with doxycycline.   . Tobacco abuse    Past Surgical History:  Procedure Laterality Date  . CESAREAN SECTION    . COLONOSCOPY  08/14/2008   Dr.. Delfin Edis   Social History:   reports that she has been smoking  cigarettes. She has a 28.00 pack-year smoking history. She has never used smokeless tobacco. She reports that she does not drink alcohol or use drugs.  Family History  Problem Relation Age of Onset  . Alzheimer's disease Father   . Heart disease Father   . Diabetes Father   . Skin cancer Father   . Heart disease Mother   . Breast cancer Mother     Medications: Patient's Medications  New Prescriptions   No medications on file  Previous Medications   ALBUTEROL (PROAIR HFA) 108 (90 BASE) MCG/ACT INHALER    INHALE 1 TO 2 PUFF INTO THE LUNGS EVERY 6 HOURS AS NEEDED FOR WHEEZING OR SHORTNESS OF BREATH   ALPRAZOLAM (XANAX) 0.5 MG TABLET    TAKE 1 TABLET BY MOUTH TWICE DAILY AS NEEDED FOR NERVOUSNESS/SLEEP   ASPIRIN EC 81 MG TABLET    Take 81 mg by mouth daily.   ATORVASTATIN (LIPITOR) 40 MG TABLET    TAKE 1 TABLET BY MOUTH DAILY FOR HIGH CHOLESTEROL   CHOLECALCIFEROL (VITAMIN D3) 1000 UNITS CAPS    Take 1 capsule by mouth daily.     LEVOTHYROXINE (SYNTHROID) 125 MCG TABLET    TAKE 1 TABLET BY MOUTH DAILY   LOSARTAN (COZAAR) 50 MG TABLET    Take 1 tablet (50 mg total) by mouth daily.   PREDNISONE (DELTASONE) 10 MG TABLET    For flares of wheeze/ shortness of breath: Take  4 each am x 2 days,   2 each am x 2 days,  1 each am x 2 days and stop   SERTRALINE (ZOLOFT) 100 MG TABLET    TAKE 1 TABLET BY MOUTH DAILY   UMECLIDINIUM-VILANTEROL (ANORO ELLIPTA) 62.5-25 MCG/INH AEPB    Inhale 1 puff into the lungs daily.  Modified Medications   No medications on file  Discontinued Medications   GLYCOPYRROLATE-FORMOTEROL (BEVESPI AEROSPHERE) 9-4.8 MCG/ACT AERO    Inhale 2 puffs into the lungs 2 (two) times daily.    Physical Exam:  There were no vitals filed for this visit. There is no height or weight on file to calculate BMI. Wt Readings from Last 3 Encounters:  02/21/19 154 lb 12.8 oz (70.2 kg)  01/24/19 156 lb 12.8 oz (71.1 kg)  12/11/18 157 lb 6.4 oz (71.4 kg)      Labs reviewed: Basic  Metabolic Panel: Recent Labs    05/06/18 1401 08/19/18 1131 11/22/18 1521  NA 141  --  141  K 4.3  --  4.3  CL 103  --  102  CO2 32  --  29  GLUCOSE 90  --  90  BUN 12  --  11  CREATININE 0.81  --  0.79  CALCIUM 9.3  --  9.6  TSH 7.95* 0.77 3.08   Liver Function Tests: Recent Labs    05/06/18 1401 11/22/18 1521  AST 21 12  ALT 18 11  BILITOT 0.5 0.5  PROT 6.8 6.7   No results for input(s):  LIPASE, AMYLASE in the last 8760 hours. No results for input(s): AMMONIA in the last 8760 hours. CBC: Recent Labs    11/22/18 1521  WBC 9.6  NEUTROABS 6,230  HGB 15.4  HCT 46.2*  MCV 92.6  PLT 283   Lipid Panel: Recent Labs    11/22/18 1521  CHOL 171  HDL 55  LDLCALC 93  TRIG 136  CHOLHDL 3.1   TSH: Recent Labs    05/06/18 1401 08/19/18 1131 11/22/18 1521  TSH 7.95* 0.77 3.08   A1C: Lab Results  Component Value Date   HGBA1C 5.9 (H) 06/17/2013     Assessment/Plan 1. COPD GOLD II/ still smoking  -improved at this time, changed to anoro but has not started yet. Ongoing follow up with pulmonary. Continued to encouraged cessation.  2. Hypothyroidism, unspecified type TSH stable in August, continue current synthroid  3. Anxiety and depression Worse at this time due to several life changes, no thoughts of SI or HI.  Discussed starting wellbutrin which she has been prescribed in the past to help curb her smoking and with her depression. She states she will let us know if she decides to take medication. Also information provided for counseling services, recommended therapy via virtual visit. Encouraged to get outside daily and start gratitude journal.   4. Cyst, colloid, third ventricle ([place]) Stable on recent follow up with neurosurgery.    5. Hyperlipidemia LDL goal <100 -stable on lipitor 40 mg daily  6. Essential hypertension Controlled on losartan 50 mg daily, encouraged dietary compliance.   7. Cigarette smoker Encouraged cessation, plans to cut  back when she moves.    Next appt: 6 months with labs prior  Gabrielle White K. Harle Battiest  [facility] [phone]   Virtual Visit via Video Note  I connected with Gabrielle White on 02/25/19 at  3:45 PM EST by a video enabled telemedicine application and verified that I am speaking with the correct person using two identifiers.  Location: Patient: home Provider: office   I discussed the limitations of evaluation and management by telemedicine and the availability of in person appointments. The patient expressed understanding and agreed to proceed.    I discussed the assessment and treatment plan with the patient. The patient was provided an opportunity to ask questions and all were answered. The patient agreed with the plan and demonstrated an understanding of the instructions.   The patient was advised to call back or seek an in-person evaluation if the symptoms worsen or if the condition fails to improve as anticipated.  I provided 25 minutes of non-face-to-face time during this encounter.  Gabrielle White. Dewaine Oats, AGNP Avs printed and mailed.

## 2019-02-24 NOTE — Telephone Encounter (Signed)
Spoke with the pt  She wants to try the anoro  Rx was sent to pharm  She will call if having any problems with this

## 2019-02-24 NOTE — Patient Instructions (Signed)
If you decide to start Wellbutrin to call and let us know. We will want to make a follow up visit so I can make sure everything is going well with this, add to your medication list and send in refills if needed  To get outside daily To start gratitude journal.  To call counseling services if needed- they can do virtual visit as well

## 2019-02-27 ENCOUNTER — Other Ambulatory Visit: Payer: Self-pay | Admitting: Nurse Practitioner

## 2019-02-27 DIAGNOSIS — E038 Other specified hypothyroidism: Secondary | ICD-10-CM

## 2019-02-28 ENCOUNTER — Other Ambulatory Visit: Payer: Self-pay | Admitting: Nurse Practitioner

## 2019-02-28 DIAGNOSIS — I10 Essential (primary) hypertension: Secondary | ICD-10-CM

## 2019-03-03 ENCOUNTER — Telehealth: Payer: Self-pay | Admitting: Internal Medicine

## 2019-03-03 MED ORDER — BUDESONIDE-FORMOTEROL FUMARATE 160-4.5 MCG/ACT IN AERO: 2.0000 | INHALATION_SPRAY | Freq: Two times a day (BID) | RESPIRATORY_TRACT | 0 refills | Status: DC

## 2019-03-03 MED ORDER — ANORO ELLIPTA 62.5-25 MCG/INH IN AEPB: 1.0000 | INHALATION_SPRAY | Freq: Every day | RESPIRATORY_TRACT | 5 refills | Status: DC

## 2019-03-03 NOTE — Telephone Encounter (Signed)
Call made to patient, confirmed DOB, she states all the inhalers are not covered. I made her aware she will need to call her insurance and find out which inhalers are covered. Voiced understanding. She is also requesting that the anoro be sent to see if it is covered by her insurance. And if we can send in her symbicort until she can get with her insurance.   Call made to pharmacy at pt request. Per pharmacy they have not received the Anoro inhaler.   Anoro and Symbicort sent to pharmacy.   Nothing further needed at this time.

## 2019-03-04 ENCOUNTER — Other Ambulatory Visit: Payer: Self-pay

## 2019-03-04 MED ORDER — LEVOTHYROXINE SODIUM 125 MCG PO TABS: 125.0000 ug | ORAL_TABLET | Freq: Every day | ORAL | 5 refills | Status: DC

## 2019-03-13 ENCOUNTER — Telehealth: Payer: Self-pay | Admitting: Internal Medicine

## 2019-03-13 NOTE — Telephone Encounter (Signed)
Called and spoke with pt in regards to the Anoro. Pt said she has been feeling sick being on the Anoro as last night pt said "I thought I was going to die after taking it."  Pt is requesting another inhaler to use instead of the Anoro. We do not have any samples of Symbicort.  I saw from a recent phone encounter that MW said pt could try Stiolto which was similar to the [place] as a PA was needed for the [facility] and pt was to need to try/fail Anoro and Stiolto.  Dr. Melvyn Novas, please advise on this what you recommend. Pt does have an upcoming appt with you 12/14 which is a televisit but pt said she is needing another inhaler now due to not able to tolerate the Anoro at all.

## 2019-03-13 NOTE — Telephone Encounter (Signed)
I called and spoke with the patient and made her aware, she verbalized understanding and I  changed her televisit for 12/14 to an in office visit.

## 2019-03-13 NOTE — Telephone Encounter (Signed)
Will need ov with drug formulary in hand and can just use proair prn in meantime   Can see np  For this but key is making sure she can use what her insurance covers because all these devices are different

## 2019-03-17 ENCOUNTER — Other Ambulatory Visit: Payer: Self-pay

## 2019-03-17 ENCOUNTER — Ambulatory Visit: Payer: Medicare Other | Admitting: Internal Medicine

## 2019-03-17 ENCOUNTER — Encounter: Payer: Self-pay | Admitting: Internal Medicine

## 2019-03-17 DIAGNOSIS — J449 Chronic obstructive pulmonary disease, unspecified: Secondary | ICD-10-CM | POA: Diagnosis not present

## 2019-03-17 DIAGNOSIS — F17210 Nicotine dependence, cigarettes, uncomplicated: Secondary | ICD-10-CM | POA: Diagnosis not present

## 2019-03-17 MED ORDER — BREZTRI AEROSPHERE 160-9-4.8 MCG/ACT IN AERO: 2.0000 | INHALATION_SPRAY | Freq: Two times a day (BID) | RESPIRATORY_TRACT | 0 refills | Status: DC

## 2019-03-17 MED ORDER — BREZTRI AEROSPHERE 160-9-4.8 MCG/ACT IN AERO: 2.0000 | INHALATION_SPRAY | Freq: Two times a day (BID) | RESPIRATORY_TRACT | 11 refills | Status: DC

## 2019-03-17 NOTE — Patient Instructions (Addendum)
Plan A = Automatic = Always=    Breztri Take 2 puffs first thing in am and then another 2 puffs about 12 hours later.    Work on inhaler technique:  relax and gently blow all the way out then take a nice smooth deep breath back in, triggering the inhaler at same time you start breathing in.  Hold for up to 5 seconds if you can. Blow out thru nose. Rinse and gargle with water when done    Plan B = Backup (to supplement plan A, not to replace it) Only use your albuterol inhaler (proair)  as a rescue medication to be used if you can't catch your breath by resting or doing a relaxed purse lip breathing pattern.  - The less you use it, the better it will work when you need it. - Ok to use the inhaler up to 2 puffs  every 4 hours if you must but call for appointment if use goes up over your usual need - Don't leave home without it !!  (think of it like the spare tire for your car)   Plan C = Crisis (instead of Plan B but only if Plan B stops working) -  Prednisone 10 mg take  4 each am x 2 days,   2 each am x 2 days,  1 each am x 2 days and stop    Please schedule a follow up visit in 6 months but call sooner if needed

## 2019-03-17 NOTE — Progress Notes (Signed)
Subjective:   Patient ID: Gabrielle White, female    DOB: 29-Jun-1945 .   MRN: [id number]    Brief patient profile:  73  yowf  MM/ active smoker with AB/GOLD 0 copd by pfts 03/08/15 and GOLD II criteria 11/28/2017     History of Present Illness  01/22/2015 acute extended re-establish ov/Gabrielle White re:  AB/ still smoking  Chief Complaint  Patient presents with  . Pulmonary Consult    pt last seen in 2014. pt states DR. Green wanted her to follow up. pt states somethines her throat feels like it closes up and she cant breath. pt states she feels like she has to take really deep breaths. pt c/o chest tightness, and prod cough white in color.pt c/o thrush she thiks may come from the inhailers.     Last better breathing  x 4 weeks prior to OV  p using prednisone but benefit only for a few days p stopped despite maint rx with symbicort (though hfa poor)  Nl routine is symbicort 160 2 bid and maybe ventolin and occ brovana no recent duoneb need  In retrospect really hasn't been able to really stay well x 4 y Cough is harsh and congested worse day than noct  rec Start Prednisone 10mg  Take 4 for two days three for two days two for two days one for two days  Plan A =  Automatic = symbicort 160 Take 2 puffs first thing in am and then another 2 puffs about 12 hours later.  Work on Interior and spatial designer: Plan B = Backup  Only use your albuterol  Plan C = crisis  Only use nebulizer iprotropium-albuterol (duoneb) if you try the ventolin first and it fails to help > ok to use up to every 4 hours  Plan D = doctor call us if not improving  Plan E = ER > go there if all else fails Try prilosec otc 20mg   Take 30-60 min before first meal of the day and Pepcid ac (famotidine) 20 mg one @  bedtime    GERD (REFLUX) diet          no change     01/24/2019  f/u ov/Gabrielle White re: GOLD II/ still smoking maint rx symb 160 2bid  Chief Complaint  Patient presents with  . Follow-up    Breathing may be  slightly better since the last visit- relates to cooler weather- using proair 2-3 x per day.   Dyspnea:  MMRC2 = can't walk a nl pace on a flat grade s sob but does fine slow and flat but by hour 10 wearing off symb and needs albuterol typically prior to pm dose of symb Cough: spells sporacic, white daytime mostly  Sleeping: sleeping on enough pillows to get 30 degrees  SABA use: as above  rec Plan A = Automatic = Always=    Breztri Take 2 puffs first thing in am and then another 2 puffs about 12 hours later.  Work on inhaler technique:    Plan B = Backup (to supplement plan A, not to replace it) Only use your albuterol inhaler as a rescue medication   Prednisone 10 mg take  4 each am x 2 days,   2 each am x 2 days,  1 each am x 2 days and stop  Please schedule a follow up office visit in 6 months, call sooner if needed     02/21/2019  f/u ov/Gabrielle White re: GOLD II/ still smoking/ problems with  access to affordable meds / does not have pred refillable as Plan C Chief Complaint  Patient presents with  . Follow-up    Patient is here for follow up for COPD. Insurance denied [employer].  Dyspnea:  Walking end of road and back now x 15-20 min s stopping / slow pace = MMRC2 = can't walk a nl pace on a flat grade s sob but does fine slow and flat  Cough: some sporadic esp in am / minimal mucoid sputum   Sleeping: no resp problems SABA use: avg twice daily at most, less while on breztri rec Plan A = Automatic = Always=    Breztri = Bevespi (one or the other)  Take 2 puffs first thing in am and then another 2 puffs about 12 hours later.  Plan B = Backup (to supplement plan A, not to replace it) Only use your albuterol inhaler  Add Plan C: crisis If not improving or needing more albuterol than usual : Prednisone 10 mg take  4 each am x 2 days,   2 each am x 2 days,  1 each am x 2 days and stop  The key is to stop smoking completely before smoking completely stops you!   03/17/2019  f/u ov/Gabrielle White re:   GOLD II copd/ still smoking/ anoro and stiolto approved by [employer] [facility] but [place] her choice  will be available Apr 04 2019  And using symb 160  2bid Chief Complaint  Patient presents with  . Follow-up    Pt states she had a COPD exacerbation 5 nights ago after she believes she might have taken too much anoro. Since the exacerbation, pt stated she felt like a "zombie" but now pt said she is getting better. Pt has occ cough with white to yellow phlegm.  Dyspnea:  No longer doing any outdoor walking  Cough: none  Sleeping: no resp symptoms  SABA use: once or twice daily  02: none    No obvious day to day or daytime variability or assoc excess/ purulent sputum or mucus plugs or hemoptysis or cp or chest tightness, subjective wheeze or overt sinus or hb symptoms.   sleeping without nocturnal  or early am exacerbation  of respiratory  c/o's or need for noct saba. Also denies any obvious fluctuation of symptoms with weather or environmental changes or other aggravating or alleviating factors except as outlined above   No unusual exposure hx or h/o childhood pna/ asthma or knowledge of premature birth.  Current Allergies, Complete Past Medical History, Past Surgical History, Family History, and Social History were reviewed in [employer] record.  ROS  The following are not active complaints unless bolded Hoarseness, sore throat, dysphagia, dental problems, itching, sneezing,  nasal congestion or discharge of excess mucus or purulent secretions, ear ache,   fever, chills, sweats, unintended wt loss or wt gain, classically pleuritic or exertional cp,  orthopnea pnd or arm/hand swelling  or leg swelling, presyncope, palpitations, abdominal pain, anorexia, nausea, vomiting, diarrhea  or change in bowel habits or change in bladder habits, change in stools or change in urine, dysuria, hematuria,  rash, arthralgias, visual complaints, headache, numbness, weakness or ataxia or  problems with walking or coordination,  change in mood or  memory.        Current Meds  Medication Sig  . albuterol (PROAIR HFA) 108 (90 Base) MCG/ACT inhaler INHALE 1 TO 2 PUFF INTO THE LUNGS EVERY 6 HOURS AS NEEDED FOR WHEEZING OR SHORTNESS OF  BREATH  . ALPRAZolam (XANAX) 0.5 MG tablet TAKE 1 TABLET BY MOUTH TWICE DAILY AS NEEDED FOR NERVOUSNESS/SLEEP  . aspirin EC 81 MG tablet Take 81 mg by mouth daily.  Marland Kitchen atorvastatin (LIPITOR) 40 MG tablet TAKE 1 TABLET BY MOUTH DAILY FOR HIGH CHOLESTEROL  . Cholecalciferol (VITAMIN D3) 1000 UNITS CAPS Take 1 capsule by mouth daily.    Marland Kitchen levothyroxine (SYNTHROID) 125 MCG tablet Take 1 tablet (125 mcg total) by mouth daily.  Marland Kitchen losartan (COZAAR) 50 MG tablet TAKE 1 TABLET(50 MG) BY MOUTH DAILY  . sertraline (ZOLOFT) 100 MG tablet TAKE 1 TABLET BY MOUTH DAILY               Objective:   Physical Exam   amb wf nad   03/17/2019   157 02/21/2019   154  01/24/2019   156  12/11/2018       157  01/22/2015      156 > 03/08/2015   162 > 06/07/2015  170 > 03/20/2016 160 > 05/15/2016   162 >   08/16/2016 163 > 11/20/2016  161 > 12/12/2016  162 > 05/23/2017  159 > 11/28/2017  159  > 03/11/2018  164 > 06/10/2018  161     01/16/13 156 lb (70.761 kg)  01/08/13 155 lb 3.2 oz (70.398 kg)  12/20/12 157 lb 9.6 oz (71.487 kg)       Vital signs reviewed - Note on arrival 02 sats  96% on RA         HEENT : pt wearing mask not removed for exam due to covid - 19 concerns.    NECK :  without JVD/Nodes/TM/ nl carotid upstrokes bilaterally   LUNGS: no acc muscle use,  Mild barrel  contour chest wall with bilateral  Distant bs and mid exp rhonchi  and  without cough on insp or exp maneuvers  and mild  Hyperresonant  to  percussion bilaterally     CV:  RRR  no s3 or murmur or increase in P2, and no edema   ABD:  soft and nontender with pos end  insp Hoover's  in the supine position. No bruits or organomegaly appreciated, bowel sounds nl  MS:   Nl gait/  ext warm without  deformities, calf tenderness, cyanosis or clubbing No obvious joint restrictions   SKIN: warm and dry without lesions    NEURO:  alert, approp, nl sensorium with  no motor or cerebellar deficits apparent.               Assessment & Plan:

## 2019-03-18 ENCOUNTER — Encounter: Payer: Self-pay | Admitting: Internal Medicine

## 2019-03-18 NOTE — Assessment & Plan Note (Addendum)
Active smoker - PFTs  11/18/10  FEV1  2.0 (95%) ratio 73 and no change p saba and DLCO 75 corrects to 100% -PFT's  03/08/2015  FEV1 1.85 (78 % ) ratio 73  p 5 % improvement from saba with DLCO  63 % corrects to 72 % for alv volume and erv 17   - added flutter 06/07/2015  - PFT's  08/16/2016  FEV1 1.42 (61 % ) ratio 71  p 8 % improvement FEV1 (and 20% FVC) from saba p symb 160  prior to study with DLCO  61/58 % corrects to 78 % for alv volume    - Allergy profile 08/16/2016 >  Eos 0.5 /  IgE  99 RAST neg -  ADDED prednisone x 6 days cycles 11/20/2016  - 05/23/2017  After extensive coaching HFA effectiveness =    90% - PFT's  11/28/2017  FEV1 1.27 (57 % ) ratio 63  p 6 % improvement from saba p symb x 160 x 2  prior to study with DLCO  61 % corrects to 81  % for alv volume   - alpha one AT screen 06/10/2018    MM level 141   - 01/24/2019    try Breztri 2bid and pred x 6 days > much better 02/21/2019  but can't afford so changed to bevespi plus prn pred for flares  - 03/17/2019  After extensive coaching inhaler device,  effectiveness =    90% > tryi beztri  2bid    Group D in terms of symptom/risk and laba/lama/ICS  therefore appropriate rx at this point >>>  Restart breztri 2 bid samples thru rest of year  Reviewed use of prednisone for flares   Pt informed of the seriousness of COVID 19 infection as a direct risk to their health  and safey and to those of their loved ones and should continue to wear facemask in public and minimize exposure to public locations but especially avoid any area or activity where non-close contacts are not observing distancing or wearing an appropriate face mask.   >>>> return in 6 m to avoid office exp during pandemic, vaccine encouraged asap

## 2019-03-18 NOTE — Assessment & Plan Note (Signed)
Counseled re importance of smoking cessation but did not meet time criteria for separate billing     I had an extended discussion with the patient reviewing all relevant studies completed to date and  lasting 15 to 20 minutes of a 25 minute visit    I performed detailed device teaching using a teach back method which extended face to face time for this visit (see above)  Each maintenance medication was reviewed in detail including emphasizing most importantly the difference between maintenance and prns and under what circumstances the prns are to be triggered using an action plan format that is not reflected in the computer generated alphabetically organized AVS which I have not found useful in most complex patients, especially with respiratory illnesses  Please see AVS for specific instructions unique to this visit that I personally wrote and verbalized to the the pt in detail and then reviewed with pt  by my nurse highlighting any  changes in therapy recommended at today's visit to their plan of care.     

## 2019-03-21 ENCOUNTER — Encounter: Payer: Self-pay | Admitting: Nurse Practitioner

## 2019-03-24 ENCOUNTER — Telehealth: Payer: Self-pay | Admitting: Internal Medicine

## 2019-03-24 MED ORDER — BREZTRI AEROSPHERE 160-9-4.8 MCG/ACT IN AERO: 2.0000 | INHALATION_SPRAY | Freq: Two times a day (BID) | RESPIRATORY_TRACT | 0 refills | Status: DC

## 2019-03-24 NOTE — Telephone Encounter (Signed)
Spoke with patient. She was requesting 2 more samples of the Breztri to last her until the new year. Advised her that I will leave 2 samples up front for her to pickup tomorrow morning after 9am. She verbalized understanding. Nothing further needed at time of call.

## 2019-04-07 ENCOUNTER — Other Ambulatory Visit: Payer: Self-pay | Admitting: Internal Medicine

## 2019-04-07 DIAGNOSIS — J449 Chronic obstructive pulmonary disease, unspecified: Secondary | ICD-10-CM

## 2019-04-07 MED ORDER — ALBUTEROL SULFATE HFA 108 (90 BASE) MCG/ACT IN AERS: INHALATION_SPRAY | RESPIRATORY_TRACT | 2 refills | Status: DC

## 2019-04-17 ENCOUNTER — Other Ambulatory Visit: Payer: Self-pay | Admitting: Nurse Practitioner

## 2019-04-17 DIAGNOSIS — E039 Hypothyroidism, unspecified: Secondary | ICD-10-CM

## 2019-04-17 DIAGNOSIS — J449 Chronic obstructive pulmonary disease, unspecified: Secondary | ICD-10-CM

## 2019-04-17 DIAGNOSIS — I10 Essential (primary) hypertension: Secondary | ICD-10-CM

## 2019-04-17 DIAGNOSIS — E785 Hyperlipidemia, unspecified: Secondary | ICD-10-CM

## 2019-05-01 ENCOUNTER — Other Ambulatory Visit: Payer: Self-pay | Admitting: Nurse Practitioner

## 2019-05-01 ENCOUNTER — Telehealth: Payer: Self-pay

## 2019-05-01 DIAGNOSIS — Z1231 Encounter for screening mammogram for malignant neoplasm of breast: Secondary | ICD-10-CM

## 2019-05-01 NOTE — Telephone Encounter (Signed)
Notification received that mammogram order is going to expire 05/06/19  Per Dewaine Oats, Gabrielle American, NP contact patient to see if she plans to get mammogram.  Spoke with patient who states she forgot about scheduling and plans to call as soon as we get off the phone to schedule.  I provided patient with number to the breast Center.  I reviewed appointment desk and patient has a pending appointment for 06/09/2019

## 2019-05-05 ENCOUNTER — Other Ambulatory Visit: Payer: Self-pay | Admitting: Nurse Practitioner

## 2019-05-05 DIAGNOSIS — F329 Major depressive disorder, single episode, unspecified: Secondary | ICD-10-CM

## 2019-05-05 DIAGNOSIS — F419 Anxiety disorder, unspecified: Secondary | ICD-10-CM

## 2019-05-05 NOTE — Telephone Encounter (Signed)
Non-opioid treatment agreement on file from: 02/25/19  RX last filled on: 12/25/2018 #60, 2 refills

## 2019-05-14 ENCOUNTER — Telehealth: Payer: Self-pay

## 2019-05-14 DIAGNOSIS — L989 Disorder of the skin and subcutaneous tissue, unspecified: Secondary | ICD-10-CM

## 2019-05-14 NOTE — Telephone Encounter (Signed)
Patient states she has been going to Dr. Nevada Crane, dermatologist, for years.  Her insurance has changed to [facility] and she was told she needs a referral to Dr. Nevada Crane.

## 2019-05-14 NOTE — Telephone Encounter (Signed)
Referral placed.

## 2019-05-20 DIAGNOSIS — T300 Burn of unspecified body region, unspecified degree: Secondary | ICD-10-CM | POA: Diagnosis not present

## 2019-05-20 DIAGNOSIS — D1800 Hemangioma unspecified site: Secondary | ICD-10-CM | POA: Diagnosis not present

## 2019-05-20 DIAGNOSIS — L820 Inflamed seborrheic keratosis: Secondary | ICD-10-CM | POA: Diagnosis not present

## 2019-05-20 DIAGNOSIS — C44519 Basal cell carcinoma of skin of other part of trunk: Secondary | ICD-10-CM | POA: Diagnosis not present

## 2019-05-26 ENCOUNTER — Other Ambulatory Visit: Payer: Self-pay

## 2019-05-26 ENCOUNTER — Telehealth (INDEPENDENT_AMBULATORY_CARE_PROVIDER_SITE_OTHER): Payer: Medicare HMO | Admitting: Family

## 2019-05-26 ENCOUNTER — Ambulatory Visit: Payer: Medicare HMO | Admitting: Primary Care

## 2019-05-26 ENCOUNTER — Telehealth: Payer: Self-pay | Admitting: Nurse Practitioner

## 2019-05-26 ENCOUNTER — Encounter: Payer: Self-pay | Admitting: Family

## 2019-05-26 DIAGNOSIS — J449 Chronic obstructive pulmonary disease, unspecified: Secondary | ICD-10-CM

## 2019-05-26 NOTE — Telephone Encounter (Signed)
Appointment scheduled with Dinah for MyChart Visit.

## 2019-05-26 NOTE — Patient Instructions (Signed)
Referral to Pulmonology [place] ordered today.Dr.Wert office staff will call you to book an appointment. - Notify provider or go to ED if Symptoms worsen

## 2019-05-26 NOTE — Progress Notes (Signed)
Patient ID: Gabrielle White, female   DOB: 10-Jan-1946, 74 y.o.   MRN: [id number] This service is provided via telemedicine  No vital signs collected/recorded due to the encounter was a telemedicine visit.   Location of patient (ex: home, work):  HOME  Patient consents to a telephone visit:  YES  Location of the provider (ex: office, home):  OFFICE  Name of any referring provider:  Sherrie Mustache, NP  Names of all persons participating in the telemedicine service and their role in the encounter:  PATIENT, Edwin Dada, [place], [facility], NP  Time spent on call:  5:39  Provider: Atthew Coutant FNP-C  Lauree Chandler, NP  Patient Care Team: Lauree Chandler, NP as PCP - General (Geriatric Medicine)  Extended Emergency Contact Information Primary Emergency Contact: [facility] Phone: [phone] Relation: Son Secondary Emergency Contact: Lasandra Beech [place] Mobile Phone: [phone] Relation: Friend  Code Status: Full code  Goals of care: Advanced Directive information Advanced Directives 02/24/2019  Does Patient Have a Medical Advance Directive? Yes  Type of Paramedic of [place];Living will  Does patient want to make changes to medical advance directive? No - Patient declined  Copy of [place] in Chart? Yes - validated most recent copy scanned in chart (See row information)  Would patient like information on creating a medical advance directive? -     Chief Complaint  Patient presents with  . Acute Visit    SOB, need referral to pulmology    HPI:  Pt is a 74 y.o. female seen today for an acute visit for evaluation of shortness of breath x 3 days and needs referral to pulmonologist.she states usually sees Dr.Michiel Melvyn Novas but was told by her insurance she needs a referral from PCP.she has had to use her rescue inhaler more frequent than usual.coughs whitish phlegm.Shortness of breath  described as " Hard to breath or catch her breath".she denies any fever or chills.she states was started on Breztri  Which was working well but insurance does not cover. She smokes 5-10 cigarettes per day though states has tried not to smoke for the past 2 days.    Past Medical History:  Diagnosis Date  . Chronic airway obstruction, not elsewhere classified   . Depressive disorder   . Depressive disorder, not elsewhere classified   . Emphysema   . Enthesopathy of ankle and tarsus, unspecified   . HTN (hypertension)   . Hyperlipidemia   . Hypertension   . Hypopotassemia   . Leukocytosis   . Migraine, unspecified, without mention of intractable migraine without mention of status migrainosus   . Nonspecific (abnormal) findings on radiological and other examination of skull and head   . Osteoarthrosis, unspecified whether generalized or localized, unspecified site   . Other emphysema ([place])   . Other specified disease of white blood cells   . Palpitations   . RMSF [facility] spotted fever) 08/23/2015   Positive IgM. Treated with doxycycline.   . Tobacco abuse    Past Surgical History:  Procedure Laterality Date  . CESAREAN SECTION    . COLONOSCOPY  08/14/2008   Dr.. Delfin Edis    Allergies  Allergen Reactions  . Cafergot   . Codeine   . Fenoprofen Calcium   . Nalfon [Fenoprofen]     Outpatient Encounter Medications as of 05/26/2019  Medication Sig  . albuterol (PROAIR HFA) 108 (90 Base) MCG/ACT inhaler INHALE 1 TO 2 PUFF INTO THE LUNGS  EVERY 6 HOURS AS NEEDED FOR WHEEZING OR SHORTNESS OF BREATH  . ALPRAZolam (XANAX) 0.5 MG tablet TAKE 1 TABLET BY MOUTH TWICE DAILY AS NEEDED FOR NERVOUSNESS/SLEEP  . aspirin EC 81 MG tablet Take 81 mg by mouth daily.  Marland Kitchen atorvastatin (LIPITOR) 40 MG tablet TAKE 1 TABLET BY MOUTH DAILY FOR HIGH CHOLESTEROL  . Budeson-Glycopyrrol-Formoterol (BREZTRI AEROSPHERE) 160-9-4.8 MCG/ACT AERO Inhale 2 puffs into the lungs 2 (two) times daily.  .  Budeson-Glycopyrrol-Formoterol (BREZTRI AEROSPHERE) 160-9-4.8 MCG/ACT AERO Inhale 2 puffs into the lungs 2 (two) times daily.  . Cholecalciferol (VITAMIN D3) 1000 UNITS CAPS Take 1 capsule by mouth daily.    Marland Kitchen levothyroxine (SYNTHROID) 125 MCG tablet Take 1 tablet (125 mcg total) by mouth daily.  Marland Kitchen losartan (COZAAR) 50 MG tablet TAKE 1 TABLET(50 MG) BY MOUTH DAILY  . sertraline (ZOLOFT) 100 MG tablet TAKE 1 TABLET BY MOUTH DAILY  . [DISCONTINUED] predniSONE (DELTASONE) 10 MG tablet For flares of wheeze/ shortness of breath: Take  4 each am x 2 days,   2 each am x 2 days,  1 each am x 2 days and stop (Patient not taking: Reported on 02/24/2019)   No facility-administered encounter medications on file as of 05/26/2019.    Review of Systems  Constitutional: Negative for appetite change, chills, fatigue and fever.  HENT: Positive for congestion. Negative for rhinorrhea, sinus pressure, sinus pain, sneezing and sore throat.        Chronic nasal congestion   Respiratory: Positive for cough. Negative for chest tightness and wheezing.        Shortness of breath from COPD  Cardiovascular: Negative for chest pain, palpitations and leg swelling.  Gastrointestinal: Negative for abdominal distention, abdominal pain, constipation, diarrhea, nausea and vomiting.  Skin: Negative for color change, pallor and rash.  Neurological: Negative for dizziness, speech difficulty, weakness, light-headedness, numbness and headaches.  Hematological: Does not bruise/bleed easily.  Psychiatric/Behavioral: Negative for agitation and sleep disturbance. The patient is not nervous/anxious.     Immunization History  Administered Date(s) Administered  . Fluad Quad(high Dose 65+) 11/22/2018  . Influenza Split 01/01/2013  . Influenza,inj,Quad PF,6+ Mos 01/08/2013, 12/17/2013, 03/10/2015, 03/06/2016, 05/06/2018  . Influenza,inj,quad, With Preservative 02/15/2017  . Influenza-Unspecified 02/20/2017  . Pneumococcal Conjugate-13  10/06/2015  . Pneumococcal Polysaccharide-23 04/11/2011  . Td 04/04/2004, 11/01/2018  . Zoster 01/12/2014   Pertinent  Health Maintenance Due  Topic Date Due  . COLONOSCOPY  04/07/2019  . MAMMOGRAM  04/18/2019  . INFLUENZA VACCINE  Completed  . DEXA SCAN  Completed  . PNA vac Low Risk Adult  Completed   Fall Risk  05/26/2019 02/24/2019 11/22/2018 10/31/2018 08/28/2018  Falls in the past year? 0 0 1 1 0  Number falls in past yr: 0 0 1 0 0  Injury with Fall? 0 0 0 1 0  Comment - - - - -  Risk for fall due to : - - History of fall(s) Impaired balance/gait -   There were no vitals filed for this visit. There is no height or weight on file to calculate BMI. Physical Exam Constitutional:      General: She is not in acute distress.    Appearance: She is not ill-appearing.  Eyes:     General: No scleral icterus.       Right eye: No discharge.        Left eye: No discharge.     Conjunctiva/sclera: Conjunctivae normal.  Pulmonary:     Effort: Pulmonary effort is normal.  No respiratory distress.  Musculoskeletal:     Right lower leg: No edema.     Left lower leg: No edema.  Skin:    Coloration: Skin is not pale.     Findings: No bruising, erythema or rash.  Neurological:     Mental Status: She is alert and oriented to person, place, and time.  Psychiatric:        Mood and Affect: Mood normal.        Behavior: Behavior normal.        Thought Content: Thought content normal.        Judgment: Judgment normal.     Labs reviewed: Recent Labs    11/22/18 1521  NA 141  K 4.3  CL 102  CO2 29  GLUCOSE 90  BUN 11  CREATININE 0.79  CALCIUM 9.6   Recent Labs    11/22/18 1521  AST 12  ALT 11  BILITOT 0.5  PROT 6.7   Recent Labs    11/22/18 1521  WBC 9.6  NEUTROABS 6,230  HGB 15.4  HCT 46.2*  MCV 92.6  PLT 283   Lab Results  Component Value Date   TSH 3.08 11/22/2018   Lab Results  Component Value Date   HGBA1C 5.9 (H) 06/17/2013   Lab Results  Component  Value Date   CHOL 171 11/22/2018   HDL 55 11/22/2018   LDLCALC 93 11/22/2018   TRIG 136 11/22/2018   CHOLHDL 3.1 11/22/2018    Significant Diagnostic Results in last 30 days:  No results found.  Assessment/Plan   COPD mixed type (HCC) No fever reported.Breathing stable during visit.Has required her rescue inhaler more often.No worsening cough or mucous production.Follows u - Ambulatory referral to Pulmonology Dr.Michael Wert per patient's [facility] Pulmonology office staff will call her for appointment.  - continue on current inhalers -  Notify provider or go to ED if Symptoms worsen     Family/ staff Communication: Reviewed plan of care with patient  Labs/tests ordered: None   Next Appointment: as needed if symptoms worsen or fail to improve.  Spent 24 minutes of non-face to face with patient    Sandrea Hughs, NP

## 2019-05-26 NOTE — Telephone Encounter (Signed)
Pt has COPD & is current with pulomonologist Dr Melvyn Novas. She called to televisit this morning with Dr Melvyn Novas, bc x 3 days SOB she needs appt.   Bc state plan has changed to [facility] they need referral sent over. Please advise... Thanks, Lattie Haw

## 2019-05-28 DIAGNOSIS — K006 Disturbances in tooth eruption: Secondary | ICD-10-CM | POA: Diagnosis not present

## 2019-06-09 ENCOUNTER — Ambulatory Visit: Payer: Medicare Other

## 2019-06-09 ENCOUNTER — Other Ambulatory Visit: Payer: Self-pay

## 2019-06-09 ENCOUNTER — Inpatient Hospital Stay (HOSPITAL_COMMUNITY)
Admission: EM | Admit: 2019-06-09 | Discharge: 2019-06-15 | DRG: 190 | Disposition: A | Discharge status: Home Health | Payer: Medicare HMO | Attending: Internal Medicine | Admitting: Internal Medicine

## 2019-06-09 ENCOUNTER — Encounter: Payer: Self-pay | Admitting: Internal Medicine

## 2019-06-09 ENCOUNTER — Ambulatory Visit: Payer: Medicare HMO | Admitting: Internal Medicine

## 2019-06-09 ENCOUNTER — Encounter (HOSPITAL_COMMUNITY): Payer: Self-pay | Admitting: Emergency Medicine

## 2019-06-09 ENCOUNTER — Emergency Department (HOSPITAL_COMMUNITY): Payer: Medicare HMO

## 2019-06-09 DIAGNOSIS — Z79899 Other long term (current) drug therapy: Secondary | ICD-10-CM | POA: Diagnosis not present

## 2019-06-09 DIAGNOSIS — F329 Major depressive disorder, single episode, unspecified: Secondary | ICD-10-CM | POA: Diagnosis present

## 2019-06-09 DIAGNOSIS — F419 Anxiety disorder, unspecified: Secondary | ICD-10-CM | POA: Diagnosis present

## 2019-06-09 DIAGNOSIS — E039 Hypothyroidism, unspecified: Secondary | ICD-10-CM | POA: Diagnosis not present

## 2019-06-09 DIAGNOSIS — I10 Essential (primary) hypertension: Secondary | ICD-10-CM | POA: Diagnosis not present

## 2019-06-09 DIAGNOSIS — F32A Depression, unspecified: Secondary | ICD-10-CM | POA: Diagnosis present

## 2019-06-09 DIAGNOSIS — E785 Hyperlipidemia, unspecified: Secondary | ICD-10-CM | POA: Diagnosis present

## 2019-06-09 DIAGNOSIS — G43909 Migraine, unspecified, not intractable, without status migrainosus: Secondary | ICD-10-CM | POA: Diagnosis present

## 2019-06-09 DIAGNOSIS — Z885 Allergy status to narcotic agent status: Secondary | ICD-10-CM | POA: Diagnosis not present

## 2019-06-09 DIAGNOSIS — F17210 Nicotine dependence, cigarettes, uncomplicated: Secondary | ICD-10-CM | POA: Diagnosis not present

## 2019-06-09 DIAGNOSIS — R739 Hyperglycemia, unspecified: Secondary | ICD-10-CM | POA: Diagnosis not present

## 2019-06-09 DIAGNOSIS — T380X5A Adverse effect of glucocorticoids and synthetic analogues, initial encounter: Secondary | ICD-10-CM | POA: Diagnosis present

## 2019-06-09 DIAGNOSIS — K219 Gastro-esophageal reflux disease without esophagitis: Secondary | ICD-10-CM | POA: Diagnosis present

## 2019-06-09 DIAGNOSIS — M1990 Unspecified osteoarthritis, unspecified site: Secondary | ICD-10-CM | POA: Diagnosis present

## 2019-06-09 DIAGNOSIS — J9601 Acute respiratory failure with hypoxia: Secondary | ICD-10-CM | POA: Diagnosis not present

## 2019-06-09 DIAGNOSIS — J441 Chronic obstructive pulmonary disease with (acute) exacerbation: Principal | ICD-10-CM | POA: Diagnosis present

## 2019-06-09 DIAGNOSIS — J9602 Acute respiratory failure with hypercapnia: Secondary | ICD-10-CM

## 2019-06-09 DIAGNOSIS — R062 Wheezing: Secondary | ICD-10-CM

## 2019-06-09 DIAGNOSIS — R0600 Dyspnea, unspecified: Secondary | ICD-10-CM | POA: Diagnosis not present

## 2019-06-09 DIAGNOSIS — Z888 Allergy status to other drugs, medicaments and biological substances status: Secondary | ICD-10-CM | POA: Diagnosis not present

## 2019-06-09 DIAGNOSIS — J449 Chronic obstructive pulmonary disease, unspecified: Secondary | ICD-10-CM

## 2019-06-09 DIAGNOSIS — G4700 Insomnia, unspecified: Secondary | ICD-10-CM | POA: Diagnosis present

## 2019-06-09 DIAGNOSIS — D751 Secondary polycythemia: Secondary | ICD-10-CM | POA: Diagnosis not present

## 2019-06-09 DIAGNOSIS — Z8249 Family history of ischemic heart disease and other diseases of the circulatory system: Secondary | ICD-10-CM

## 2019-06-09 DIAGNOSIS — F330 Major depressive disorder, recurrent, mild: Secondary | ICD-10-CM | POA: Diagnosis not present

## 2019-06-09 DIAGNOSIS — R0602 Shortness of breath: Secondary | ICD-10-CM | POA: Diagnosis not present

## 2019-06-09 DIAGNOSIS — E7849 Other hyperlipidemia: Secondary | ICD-10-CM | POA: Diagnosis not present

## 2019-06-09 DIAGNOSIS — Z79890 Hormone replacement therapy (postmenopausal): Secondary | ICD-10-CM

## 2019-06-09 DIAGNOSIS — Z20822 Contact with and (suspected) exposure to covid-19: Secondary | ICD-10-CM | POA: Diagnosis present

## 2019-06-09 DIAGNOSIS — R0902 Hypoxemia: Secondary | ICD-10-CM | POA: Diagnosis not present

## 2019-06-09 LAB — URINALYSIS, ROUTINE W REFLEX MICROSCOPIC
Bilirubin Urine: NEGATIVE
Glucose, UA: NEGATIVE mg/dL
Ketones, ur: NEGATIVE mg/dL
Leukocytes,Ua: NEGATIVE
Nitrite: NEGATIVE
Protein, ur: NEGATIVE mg/dL
Specific Gravity, Urine: 1.009 (ref 1.005–1.030)
pH: 6 (ref 5.0–8.0)

## 2019-06-09 LAB — PHOSPHORUS: Phosphorus: 4 mg/dL (ref 2.5–4.6)

## 2019-06-09 LAB — CBC WITH DIFFERENTIAL/PLATELET
Abs Immature Granulocytes: 0.05 10*3/uL (ref 0.00–0.07)
Basophils Absolute: 0.1 10*3/uL (ref 0.0–0.1)
Basophils Relative: 1 %
Eosinophils Absolute: 0.8 10*3/uL — ABNORMAL HIGH (ref 0.0–0.5)
Eosinophils Relative: 7 %
HCT: 49.8 % — ABNORMAL HIGH (ref 36.0–46.0)
Hemoglobin: 15.6 g/dL — ABNORMAL HIGH (ref 12.0–15.0)
Immature Granulocytes: 0 %
Lymphocytes Relative: 18 %
Lymphs Abs: 2.1 10*3/uL (ref 0.7–4.0)
MCH: 30.5 pg (ref 26.0–34.0)
MCHC: 31.3 g/dL (ref 30.0–36.0)
MCV: 97.5 fL (ref 80.0–100.0)
Monocytes Absolute: 0.7 10*3/uL (ref 0.1–1.0)
Monocytes Relative: 6 %
Neutro Abs: 7.6 10*3/uL (ref 1.7–7.7)
Neutrophils Relative %: 68 %
Platelets: 322 10*3/uL (ref 150–400)
RBC: 5.11 MIL/uL (ref 3.87–5.11)
RDW: 13.2 % (ref 11.5–15.5)
WBC: 11.3 10*3/uL — ABNORMAL HIGH (ref 4.0–10.5)
nRBC: 0 % (ref 0.0–0.2)

## 2019-06-09 LAB — PROTIME-INR
INR: 0.9 (ref 0.8–1.2)
Prothrombin Time: 12.3 seconds (ref 11.4–15.2)

## 2019-06-09 LAB — COMPREHENSIVE METABOLIC PANEL
ALT: 21 U/L (ref 0–44)
AST: 17 U/L (ref 15–41)
Albumin: 4.2 g/dL (ref 3.5–5.0)
Alkaline Phosphatase: 118 U/L (ref 38–126)
Anion gap: 7 (ref 5–15)
BUN: 15 mg/dL (ref 8–23)
CO2: 30 mmol/L (ref 22–32)
Calcium: 9.4 mg/dL (ref 8.9–10.3)
Chloride: 103 mmol/L (ref 98–111)
Creatinine, Ser: 0.76 mg/dL (ref 0.44–1.00)
GFR calc Af Amer: >60 mL/min (ref >60)
GFR calc non Af Amer: >60 mL/min (ref >60)
Glucose, Bld: 121 mg/dL — ABNORMAL HIGH (ref 70–99)
Potassium: 4 mmol/L (ref 3.5–5.1)
Sodium: 140 mmol/L (ref 135–145)
Total Bilirubin: 0.8 mg/dL (ref 0.3–1.2)
Total Protein: 7.5 g/dL (ref 6.5–8.1)

## 2019-06-09 LAB — BLOOD GAS, VENOUS
Acid-Base Excess: 2.5 mmol/L — ABNORMAL HIGH (ref 0.0–2.0)
Bicarbonate: 29.7 mmol/L — ABNORMAL HIGH (ref 20.0–28.0)
FIO2: 21
O2 Saturation: 82.7 %
Patient temperature: 98.6
pCO2, Ven: 58.4 mmHg (ref 44.0–60.0)
pH, Ven: 7.3 (ref 7.250–7.430)
pO2, Ven: 48.3 mmHg — ABNORMAL HIGH (ref 32.0–45.0)

## 2019-06-09 LAB — RESPIRATORY PANEL BY RT PCR (FLU A&B, COVID)
Influenza A by PCR: NEGATIVE
Influenza B by PCR: NEGATIVE
SARS Coronavirus 2 by RT PCR: NEGATIVE

## 2019-06-09 LAB — LACTIC ACID, PLASMA: Lactic Acid, Venous: 1.1 mmol/L (ref 0.5–1.9)

## 2019-06-09 LAB — MAGNESIUM: Magnesium: 2.1 mg/dL (ref 1.7–2.4)

## 2019-06-09 MED ORDER — IPRATROPIUM BROMIDE HFA 17 MCG/ACT IN AERS
2.0000 | INHALATION_SPRAY | Freq: Once | RESPIRATORY_TRACT | Status: AC
  Administered 2019-06-09: 2 via RESPIRATORY_TRACT
  Filled 2019-06-09: qty 12.9

## 2019-06-09 MED ORDER — LEVOTHYROXINE SODIUM 25 MCG PO TABS
125.0000 ug | ORAL_TABLET | Freq: Every day | ORAL | Status: DC
  Administered 2019-06-10 – 2019-06-15 (×6): 125 ug via ORAL
  Filled 2019-06-09 (×6): qty 1

## 2019-06-09 MED ORDER — ALPRAZOLAM 0.5 MG PO TABS
0.5000 mg | ORAL_TABLET | Freq: Two times a day (BID) | ORAL | Status: DC | PRN
  Administered 2019-06-11 – 2019-06-14 (×5): 0.5 mg via ORAL
  Filled 2019-06-09 (×6): qty 1

## 2019-06-09 MED ORDER — PREDNISONE 20 MG PO TABS
40.0000 mg | ORAL_TABLET | Freq: Every day | ORAL | Status: DC
  Administered 2019-06-10 – 2019-06-12 (×3): 40 mg via ORAL
  Filled 2019-06-09 (×3): qty 2

## 2019-06-09 MED ORDER — ACETAMINOPHEN 325 MG PO TABS: 650.0000 mg | ORAL_TABLET | Freq: Four times a day (QID) | ORAL | Status: DC | PRN

## 2019-06-09 MED ORDER — SERTRALINE HCL 100 MG PO TABS
100.0000 mg | ORAL_TABLET | Freq: Every day | ORAL | Status: DC
  Administered 2019-06-09 – 2019-06-14 (×6): 100 mg via ORAL
  Filled 2019-06-09 (×7): qty 1

## 2019-06-09 MED ORDER — ALBUTEROL SULFATE (2.5 MG/3ML) 0.083% IN NEBU
2.5000 mg | INHALATION_SOLUTION | RESPIRATORY_TRACT | Status: DC | PRN
  Administered 2019-06-09 – 2019-06-11 (×2): 2.5 mg via RESPIRATORY_TRACT
  Filled 2019-06-09 (×2): qty 3

## 2019-06-09 MED ORDER — FAMOTIDINE 20 MG PO TABS
20.0000 mg | ORAL_TABLET | Freq: Every day | ORAL | Status: DC
  Administered 2019-06-09: 20 mg via ORAL
  Filled 2019-06-09: qty 1

## 2019-06-09 MED ORDER — METHYLPREDNISOLONE SODIUM SUCC 40 MG IJ SOLR
40.0000 mg | Freq: Four times a day (QID) | INTRAMUSCULAR | Status: AC
  Administered 2019-06-09 (×2): 40 mg via INTRAVENOUS
  Filled 2019-06-09 (×2): qty 1

## 2019-06-09 MED ORDER — LOSARTAN POTASSIUM 50 MG PO TABS
50.0000 mg | ORAL_TABLET | Freq: Every evening | ORAL | Status: DC
  Administered 2019-06-09 – 2019-06-14 (×6): 50 mg via ORAL
  Filled 2019-06-09 (×6): qty 1

## 2019-06-09 MED ORDER — ALBUTEROL SULFATE HFA 108 (90 BASE) MCG/ACT IN AERS
4.0000 | INHALATION_SPRAY | Freq: Once | RESPIRATORY_TRACT | Status: AC
  Administered 2019-06-09: 4 via RESPIRATORY_TRACT
  Filled 2019-06-09: qty 6.7

## 2019-06-09 MED ORDER — ACETAMINOPHEN 650 MG RE SUPP: 650.0000 mg | Freq: Four times a day (QID) | RECTAL | Status: DC | PRN

## 2019-06-09 MED ORDER — PANTOPRAZOLE SODIUM 40 MG PO TBEC
40.0000 mg | DELAYED_RELEASE_TABLET | Freq: Every day | ORAL | Status: DC
  Administered 2019-06-10 – 2019-06-15 (×6): 40 mg via ORAL
  Filled 2019-06-09 (×6): qty 1

## 2019-06-09 MED ORDER — ENOXAPARIN SODIUM 40 MG/0.4ML ~~LOC~~ SOLN
40.0000 mg | SUBCUTANEOUS | Status: DC
  Administered 2019-06-09 – 2019-06-14 (×6): 40 mg via SUBCUTANEOUS
  Filled 2019-06-09 (×7): qty 0.4

## 2019-06-09 MED ORDER — ATORVASTATIN CALCIUM 40 MG PO TABS
40.0000 mg | ORAL_TABLET | Freq: Every day | ORAL | Status: DC
  Administered 2019-06-09 – 2019-06-14 (×6): 40 mg via ORAL
  Filled 2019-06-09 (×6): qty 1

## 2019-06-09 MED ORDER — PREDNISONE 20 MG PO TABS
60.0000 mg | ORAL_TABLET | Freq: Once | ORAL | Status: AC
  Administered 2019-06-09: 60 mg via ORAL
  Filled 2019-06-09: qty 3

## 2019-06-09 MED ORDER — GUAIFENESIN-DM 100-10 MG/5ML PO SYRP
5.0000 mL | ORAL_SOLUTION | ORAL | Status: DC | PRN
  Administered 2019-06-10 – 2019-06-13 (×6): 5 mL via ORAL
  Filled 2019-06-09 (×8): qty 10

## 2019-06-09 MED ORDER — IPRATROPIUM-ALBUTEROL 0.5-2.5 (3) MG/3ML IN SOLN
3.0000 mL | Freq: Four times a day (QID) | RESPIRATORY_TRACT | Status: DC
  Administered 2019-06-09 – 2019-06-11 (×7): 3 mL via RESPIRATORY_TRACT
  Filled 2019-06-09 (×7): qty 3

## 2019-06-09 NOTE — ED Triage Notes (Signed)
Pt complaint of SOB worsening since Saturday; sent from [facility] Pulmonary from respiratory failure/COPD exacerbation.

## 2019-06-09 NOTE — Patient Instructions (Signed)
Go directly to [facility] for evaluation of new dx of respiratory failure/ copd exacerbation

## 2019-06-09 NOTE — H&P (Signed)
History and Physical    Gabrielle White [id number] DOB: Jan 04, 1946 DOA: 06/09/2019  PCP: Lauree Chandler, NP   Patient coming from: Home/pulmonologist office.  I have personally briefly reviewed patient's old medical records in [place]  Chief Complaint: Shortness of breath.  HPI: Gabrielle White is a 74 y.o. female with medical history significant of COPD/emphysema, anxiety, depression, insomnia, ankle and tarsus enthesopathy, hypertension, hyperlipidemia, osteoarthrosis, palpitations, [facility] spotted fever treated with doxycycline, tobacco abuse who is coming to the emergency department from the pulmonologist office due to progressively worse dyspnea for the past 4-5 days associated with wheezing, which has not responded to multiple uses of her albuterol MDI.  She has also increases sputum production, but states it is clear/white.  She denies fever, chills, rhinorrhea, sore throat, myalgias, arthralgias, sick contacts or travel history.  No chest pain, dizziness, diaphoresis, PND, orthopnea or pitting edema of the lower extremities.  She states that a few days ago she had some palpitations after using albuterol MDI several times.  She denies abdominal pain, nausea, vomiting, diarrhea, constipation, melena or hematochezia.  No dysuria, frequency or hematuria.  Denies polyuria, polydipsia, polyphagia or blurred vision.  ED Course: Initial vital signs temperature 98.9 F, pulse 92, respirations 22, blood pressure 132/81 mmHg and O2 sat 91% on room air.  The patient was given bronchodilator and 60 mg of prednisone.  She became hypoxic and tachypneic with ambulation, so our service was asked to evaluate for further treatment.  CBC shows white count 11.3, hemoglobin 15.6 g/dL and platelets 322.  PT 12.3 and INR 0.9.  Venous blood gas showed a decreased PO2 of 48.30 mmHg and bicarbonate of 29.7 mmol/L.  CMP showed a glucose of 121 mg/dL, but was otherwise normal.  Lactic acid was 1.1  mmol/L.  SARS coronavirus 2, influenza A and B by PCR was negative.  Her chest radiograph did not have any acute cardiopulmonary disease, but showed hyperinflation compatible with emphysema.  Please image for further detail.  Review of Systems: As per HPI otherwise 10 point review of systems negative.   Past Medical History:  Diagnosis Date  . Chronic airway obstruction, not elsewhere classified   . Depressive disorder   . Depressive disorder, not elsewhere classified   . Emphysema   . Enthesopathy of ankle and tarsus, unspecified   . HTN (hypertension)   . Hyperlipidemia   . Hypertension   . Hypopotassemia   . Leukocytosis   . Migraine, unspecified, without mention of intractable migraine without mention of status migrainosus   . Nonspecific (abnormal) findings on radiological and other examination of skull and head   . Osteoarthrosis, unspecified whether generalized or localized, unspecified site   . Other emphysema ([place])   . Other specified disease of white blood cells   . Palpitations   . RMSF [facility] spotted fever) 08/23/2015   Positive IgM. Treated with doxycycline.   . Tobacco abuse     Past Surgical History:  Procedure Laterality Date  . CESAREAN SECTION    . COLONOSCOPY  08/14/2008   Dr.. Delfin Edis     reports that she has been smoking cigarettes. She has a 28.00 pack-year smoking history. She has never used smokeless tobacco. She reports that she does not drink alcohol or use drugs.  Allergies  Allergen Reactions  . Cafergot   . Codeine   . Fenoprofen Calcium   . Nalfon [Fenoprofen]     Family History  Problem Relation Age of Onset  .  Alzheimer's disease Father   . Heart disease Father   . Diabetes Father   . Skin cancer Father   . Heart disease Mother   . Breast cancer Mother    Prior to Admission medications   Medication Sig Start Date End Date Taking? Authorizing Provider  acetaminophen (TYLENOL) 500 MG tablet Take 500 mg by mouth every 6  (six) hours as needed for mild pain or headache.   Yes [provider]  albuterol (PROAIR HFA) 108 (90 Base) MCG/ACT inhaler INHALE 1 TO 2 PUFF INTO THE LUNGS EVERY 6 HOURS AS NEEDED FOR WHEEZING OR SHORTNESS OF BREATH Patient taking differently: Inhale 1-2 puffs into the lungs every 6 (six) hours as needed for wheezing or shortness of breath.  04/07/19  Yes Tanda Rockers, MD  ALPRAZolam Duanne Moron) 0.5 MG tablet TAKE 1 TABLET BY MOUTH TWICE DAILY AS NEEDED FOR NERVOUSNESS/SLEEP Patient taking differently: Take 0.5 mg by mouth 2 (two) times daily as needed for anxiety or sleep.  05/06/19  Yes Lauree Chandler, NP  atorvastatin (LIPITOR) 40 MG tablet TAKE 1 TABLET BY MOUTH DAILY FOR HIGH CHOLESTEROL Patient taking differently: Take 40 mg by mouth daily at 6 PM.  11/29/18  Yes Eubanks, Carlos American, NP  Budeson-Glycopyrrol-Formoterol (BREZTRI AEROSPHERE) 160-9-4.8 MCG/ACT AERO Inhale 2 puffs into the lungs 2 (two) times daily. 03/17/19  Yes Tanda Rockers, MD  Cholecalciferol (VITAMIN D3) 1000 UNITS CAPS Take 1 capsule by mouth daily.     Yes [provider]  levothyroxine (SYNTHROID) 125 MCG tablet Take 1 tablet (125 mcg total) by mouth daily. 03/04/19  Yes Lauree Chandler, NP  losartan (COZAAR) 50 MG tablet TAKE 1 TABLET(50 MG) BY MOUTH DAILY Patient taking differently: Take 50 mg by mouth daily.  03/03/19  Yes Lauree Chandler, NP  sertraline (ZOLOFT) 100 MG tablet TAKE 1 TABLET BY MOUTH DAILY Patient taking differently: Take 100 mg by mouth daily.  11/29/18  Yes Lauree Chandler, NP    Physical Exam: Vitals:   06/09/19 1105 06/09/19 1130 06/09/19 1218 06/09/19 1240  BP: (!) 162/83 139/89 (!) 144/77 (!) 149/68  Pulse: (!) 109 85 90 82  Resp: (!) 33 19 (!) 23 20  Temp:      TempSrc:      SpO2: 98% 96% 99% 98%  Weight:      Height:        Constitutional: NAD, calm, comfortable Eyes: PERRL, lids and conjunctivae normal ENMT: Nasal cannula in place.  Mucous membranes are  mildly dry. Posterior pharynx clear of any exudate or lesions. Neck: normal, supple, no masses, no thyromegaly Respiratory: Mildly tachypneic in the low to mid 20s.  Decreased breath sounds with bilateral rhonchi and wheezing, no crackles.  No accessory muscle use.  Cardiovascular: Regular rate and rhythm, no murmurs / rubs / gallops. No extremity edema. 2+ pedal pulses. No carotid bruits.  Abdomen: Nondistended.  BS positive.  Soft, no tenderness, no masses palpated. No hepatosplenomegaly. Musculoskeletal: no clubbing / cyanosis. Good ROM, no contractures. Normal muscle tone.  Skin: no rashes, lesions, ulcers. No induration Neurologic: CN 2-12 grossly intact. Sensation intact, DTR normal. Strength 5/5 in all 4.  Psychiatric: Normal judgment and insight. Alert and oriented x 3. Normal mood.   Labs on Admission: I have personally reviewed following labs and imaging studies  CBC: Recent Labs  Lab 06/09/19 1105  WBC 11.3*  NEUTROABS 7.6  HGB 15.6*  HCT 49.8*  MCV 97.5  PLT 322  Basic Metabolic Panel: Recent Labs  Lab 06/09/19 1105  NA 140  K 4.0  CL 103  CO2 30  GLUCOSE 121*  BUN 15  CREATININE 0.76  CALCIUM 9.4   GFR: Estimated Creatinine Clearance: 61.1 mL/min (by C-G formula based on SCr of 0.76 mg/dL). Liver Function Tests: Recent Labs  Lab 06/09/19 1105  AST 17  ALT 21  ALKPHOS 118  BILITOT 0.8  PROT 7.5  ALBUMIN 4.2   No results for input(s): LIPASE, AMYLASE in the last 168 hours. No results for input(s): AMMONIA in the last 168 hours. Coagulation Profile: Recent Labs  Lab 06/09/19 1105  INR 0.9   Cardiac Enzymes: No results for input(s): CKTOTAL, CKMB, CKMBINDEX, TROPONINI in the last 168 hours. BNP (last 3 results) No results for input(s): PROBNP in the last 8760 hours. HbA1C: No results for input(s): HGBA1C in the last 72 hours. CBG: No results for input(s): GLUCAP in the last 168 hours. Lipid Profile: No results for input(s): CHOL, HDL,  LDLCALC, TRIG, CHOLHDL, LDLDIRECT in the last 72 hours. Thyroid Function Tests: No results for input(s): TSH, T4TOTAL, FREET4, T3FREE, THYROIDAB in the last 72 hours. Anemia Panel: No results for input(s): VITAMINB12, FOLATE, FERRITIN, TIBC, IRON, RETICCTPCT in the last 72 hours.  Radiological Exams on Admission: DG Chest 2 View  Result Date: 06/09/2019 CLINICAL DATA:  Shortness of breath worsening since 2 days ago. EXAM: CHEST - 2 VIEW COMPARISON:  05/07/2017 FINDINGS: Cardiac silhouette is normal in size. No mediastinal or hilar masses. No evidence of adenopathy. Lungs are hyperexpanded, but clear. No pleural effusion or pneumothorax. Skeletal structures are intact. IMPRESSION: 1. No acute cardiopulmonary disease. 2. Hyperexpanded lungs stable from the prior exam suggesting COPD. Electronically Signed   By: Lajean Manes M.D.   On: 06/09/2019 12:13    EKG: Independently reviewed. Sinus rhythm Borderline right axis deviation.  Assessment/Plan Principal Problem:   Acute respiratory failure with hypoxemia (HCC)   Acute COPD exacerbation Observation/telemetry. Continue supplemental oxygen. As scheduled and as needed bronchodilators. Solu-Medrol 40 mg IVP x2 doses. Switch to prednisone taper in a.m. Start GERD treatment with PPI and famotidine.  Active Problems:   Cigarette smoker Smoking cessation advised. No nicotine withdrawal symptoms at this time. Offered as needed nicotine replacement therapy.    Polycythemia Secondary to smoking/symptomatic COPD. Smoking cessation advised.    Hyperlipidemia Continue atorvastatin p.o. in the evening.    Hypothyroidism Continue levothyroxine 125 mcg p.o. daily.    Hyperglycemia Check fasting glucose in a.m.    Essential hypertension Continue losartan 50 mg p.o. daily. Monitor BP, renal function electrolytes.    Depression Continue sertraline 100 mg p.o. at bedtime.    DVT prophylaxis: Lovenox SQ. Code Status: Full  code. Family Communication: Disposition Plan: Observation for COPD exacerbation treatment. Consults called: Admission status: Observation/telemetry.   Reubin Milan MD [facility]  If 7PM-7AM, please contact night-coverage [email or web address]  06/09/2019, 1:30 PM   This document was prepared using Dragon voice recognition software and may contain some unintended transcription errors.

## 2019-06-09 NOTE — ED Notes (Signed)
Attempted to call report, floor unaware they were getting a pt. Will call back in 5.

## 2019-06-09 NOTE — ED Provider Notes (Addendum)
[place] DEPT Provider Note   CSN: [id number] Arrival date & time: 06/09/19  1038     History Chief Complaint  Patient presents with  . Shortness of Breath    Gabrielle White is a 74 y.o. female.  HPI  Patient is a 74 year old female with history of COPD, hypertension, HLD.  She states she has never been hospitalized for COPD in the past.  No intubations.  She is typically well controlled on Breztri and albuterol.  She states over the past 4 days she has been using albuterol more more frequently.  She states that she has been coughing worse than she normally does which is been at times productive and at times dry.  She endorses yellow mucousy sputum.   She denies any fevers or chills.  She denies any hemoptysis.  No recent surgery or cancer. Patient states that she uses her Breztri as prescribed.   She denies any leg swelling, history of CHF, denies any recent surgery, hemoptysis, unilateral calf tenderness or swelling.  She denies any decreased mobility.      Past Medical History:  Diagnosis Date  . Chronic airway obstruction, not elsewhere classified   . Depressive disorder   . Depressive disorder, not elsewhere classified   . Emphysema   . Enthesopathy of ankle and tarsus, unspecified   . HTN (hypertension)   . Hyperlipidemia   . Hypertension   . Hypopotassemia   . Leukocytosis   . Migraine, unspecified, without mention of intractable migraine without mention of status migrainosus   . Nonspecific (abnormal) findings on radiological and other examination of skull and head   . Osteoarthrosis, unspecified whether generalized or localized, unspecified site   . Other emphysema ([place])   . Other specified disease of white blood cells   . Palpitations   . RMSF [facility] spotted fever) 08/23/2015   Positive IgM. Treated with doxycycline.   . Tobacco abuse     Patient Active Problem List   Diagnosis Date Noted  . Acute respiratory failure  with hypoxemia ([place]) 06/09/2019  . Polycythemia 06/09/2019  . Atherosclerosis of aorta ([place]) 11/12/2018  . Emphysema lung ([place]) 11/12/2018  . Rhinitis, chronic 03/12/2018  . Abnormal CXR 11/21/2016  . Ganglion cyst of wrist 05/23/2016  . DOE (dyspnea on exertion) 03/21/2016  . Absence attack ([place]) 10/06/2015  . RMSF [facility] spotted fever) 08/23/2015  . Depression 10/28/2014  . Balance problem 07/07/2014  . Claustrophobia 12/17/2013  . Urinary incontinence 10/29/2013  . Chest pain, unspecified 10/07/2013  . Essential hypertension 10/07/2013  . Anxiety state 01/08/2013  . Chronic obstructive airway disease with asthma ([place]) 11/18/2012  . Palpitations 10/22/2012  . Pain in finger of left hand 08/21/2012  . Hyperlipidemia 08/20/2012  . Hypothyroidism 08/20/2012  . COPD GOLD II/ still smoking  02/03/2011  . Chronic cough 02/03/2011  . Hyperglycemia 11/18/2010  . Cigarette smoker 11/10/2010    Past Surgical History:  Procedure Laterality Date  . CESAREAN SECTION    . COLONOSCOPY  08/14/2008   DrMarland Kitchen Delfin Edis     OB History   No obstetric history on file.     Family History  Problem Relation Age of Onset  . Alzheimer's disease Father   . Heart disease Father   . Diabetes Father   . Skin cancer Father   . Heart disease Mother   . Breast cancer Mother     Social History   Tobacco Use  . Smoking status: Current Every Day  Smoker    Packs/day: 1.00    Years: 28.00    Pack years: 28.00    Types: Cigarettes  . Smokeless tobacco: Never Used  . Tobacco comment: .5ppd as of 03/17/19  Substance Use Topics  . Alcohol use: No    Alcohol/week: 0.0 standard drinks  . Drug use: No    Home Medications Prior to Admission medications   Medication Sig Start Date End Date Taking? Authorizing Provider  acetaminophen (TYLENOL) 500 MG tablet Take 500 mg by mouth every 6 (six) hours as needed for mild pain or headache.   Yes [provider]  albuterol (PROAIR  HFA) 108 (90 Base) MCG/ACT inhaler INHALE 1 TO 2 PUFF INTO THE LUNGS EVERY 6 HOURS AS NEEDED FOR WHEEZING OR SHORTNESS OF BREATH Patient taking differently: Inhale 1-2 puffs into the lungs every 6 (six) hours as needed for wheezing or shortness of breath.  04/07/19  Yes Tanda Rockers, MD  ALPRAZolam Duanne Moron) 0.5 MG tablet TAKE 1 TABLET BY MOUTH TWICE DAILY AS NEEDED FOR NERVOUSNESS/SLEEP Patient taking differently: Take 0.5 mg by mouth 2 (two) times daily as needed for anxiety or sleep.  05/06/19  Yes Lauree Chandler, NP  atorvastatin (LIPITOR) 40 MG tablet TAKE 1 TABLET BY MOUTH DAILY FOR HIGH CHOLESTEROL Patient taking differently: Take 40 mg by mouth daily at 6 PM.  11/29/18  Yes Eubanks, Carlos American, NP  Budeson-Glycopyrrol-Formoterol (BREZTRI AEROSPHERE) 160-9-4.8 MCG/ACT AERO Inhale 2 puffs into the lungs 2 (two) times daily. 03/17/19  Yes Tanda Rockers, MD  Cholecalciferol (VITAMIN D3) 1000 UNITS CAPS Take 1 capsule by mouth daily.     Yes [provider]  levothyroxine (SYNTHROID) 125 MCG tablet Take 1 tablet (125 mcg total) by mouth daily. 03/04/19  Yes Lauree Chandler, NP  losartan (COZAAR) 50 MG tablet TAKE 1 TABLET(50 MG) BY MOUTH DAILY Patient taking differently: Take 50 mg by mouth daily.  03/03/19  Yes Lauree Chandler, NP  sertraline (ZOLOFT) 100 MG tablet TAKE 1 TABLET BY MOUTH DAILY Patient taking differently: Take 100 mg by mouth daily.  11/29/18  Yes Lauree Chandler, NP    Allergies    Cafergot, Codeine, Fenoprofen calcium, and Nalfon [fenoprofen]  Review of Systems   Review of Systems  Constitutional: Negative for chills and fever.  HENT: Negative for congestion.   Eyes: Negative for pain.  Respiratory: Positive for cough and shortness of breath.   Cardiovascular: Negative for chest pain and leg swelling.  Gastrointestinal: Negative for abdominal pain and vomiting.  Genitourinary: Negative for dysuria.  Musculoskeletal: Negative for myalgias.  Skin:  Negative for rash.  Neurological: Negative for dizziness and headaches.    Physical Exam Updated Vital Signs BP (!) 149/68   Pulse 82   Temp 98.9 F (37.2 C) (Oral)   Resp 20   Ht 5\' 5"  (1.651 m)   Wt 71.2 kg   SpO2 98%   BMI 26.13 kg/m   Physical Exam Vitals and nursing note reviewed.  Constitutional:      General: She is in acute distress.  HENT:     Head: Normocephalic and atraumatic.     Nose: Nose normal.     Mouth/Throat:     Mouth: Mucous membranes are moist.  Eyes:     General: No scleral icterus. Cardiovascular:     Rate and Rhythm: Normal rate and regular rhythm.     Pulses: Normal pulses.     Heart sounds: Normal heart sounds.  Pulmonary:  Comments: Mildly increased work of breathing.  No significant accessory muscle use.  Speaking in full sentences however does appear somewhat dyspneic.  Lungs are coarse and rhonchorous in all fields somewhat worse on right than left.  No crackles.  Expiratory wheezing noted. Abdominal:     Palpations: Abdomen is soft.     Tenderness: There is no abdominal tenderness. There is no guarding or rebound.  Musculoskeletal:     Cervical back: Normal range of motion.     Right lower leg: No edema.     Left lower leg: No edema.  Skin:    General: Skin is warm and dry.     Capillary Refill: Capillary refill takes less than 2 seconds. Cap refill < 2 s    Comments: No pallor.  Neurological:     Mental Status: She is alert. Mental status is at baseline.  Psychiatric:        Mood and Affect: Mood normal.        Behavior: Behavior normal.     ED Results / Procedures / Treatments   Labs (all labs ordered are listed, but only abnormal results are displayed) Labs Reviewed  COMPREHENSIVE METABOLIC PANEL - Abnormal; Notable for the following components:      Result Value   Glucose, Bld 121 (*)    All other components within normal limits  CBC WITH DIFFERENTIAL/PLATELET - Abnormal; Notable for the following components:   WBC  11.3 (*)    Hemoglobin 15.6 (*)    HCT 49.8 (*)    Eosinophils Absolute 0.8 (*)    All other components within normal limits  BLOOD GAS, VENOUS - Abnormal; Notable for the following components:   pO2, Ven 48.3 (*)    Bicarbonate 29.7 (*)    Acid-Base Excess 2.5 (*)    All other components within normal limits  RESPIRATORY PANEL BY RT PCR (FLU A&B, COVID)  CULTURE, BLOOD (ROUTINE X 2)  CULTURE, BLOOD (ROUTINE X 2)  LACTIC ACID, PLASMA  PROTIME-INR  URINALYSIS, ROUTINE W REFLEX MICROSCOPIC  MAGNESIUM  PHOSPHORUS    EKG None  Radiology DG Chest 2 View  Result Date: 06/09/2019 CLINICAL DATA:  Shortness of breath worsening since 2 days ago. EXAM: CHEST - 2 VIEW COMPARISON:  05/07/2017 FINDINGS: Cardiac silhouette is normal in size. No mediastinal or hilar masses. No evidence of adenopathy. Lungs are hyperexpanded, but clear. No pleural effusion or pneumothorax. Skeletal structures are intact. IMPRESSION: 1. No acute cardiopulmonary disease. 2. Hyperexpanded lungs stable from the prior exam suggesting COPD. Electronically Signed   By: Lajean Manes M.D.   On: 06/09/2019 12:13    Procedures Procedures (including critical care time)  Medications Ordered in ED Medications  enoxaparin (LOVENOX) injection 40 mg (has no administration in time range)  acetaminophen (TYLENOL) tablet 650 mg (has no administration in time range)    Or  acetaminophen (TYLENOL) suppository 650 mg (has no administration in time range)  predniSONE (DELTASONE) tablet 40 mg (has no administration in time range)  albuterol (VENTOLIN HFA) 108 (90 Base) MCG/ACT inhaler 4 puff (4 puffs Inhalation Given 06/09/19 1127)  ipratropium (ATROVENT HFA) inhaler 2 puff (2 puffs Inhalation Given 06/09/19 1127)  predniSONE (DELTASONE) tablet 60 mg (60 mg Oral Given 06/09/19 1128)    ED Course  I have reviewed the triage vital signs and the nursing notes.  Pertinent labs & imaging results that were available during my care of the  patient were reviewed by me and considered in my medical decision  making (see chart for details).    MDM Rules/Calculators/A&P                      Patient is a 74 year old female with a history of COPD Gold 2 on daily inhaler and albuterol.  She has been using albuterol infrequently over the past 3 days.  She has been wheezing and coughing more significantly.  She states that her sputum has been clear/white.  She denies any fevers, chills, chest pain, nausea, vomiting, diarrhea, abdominal pain.  CMP is relatively unremarkable.  CBC with very mild leukocytosis is unremarkable.  She has mild elevation in hemoglobin and HCT likely secondary to chronic hypoxia secondary to her COPD/smoking.  VBG shows normal O2.  Slightly elevated bicarb.  She does not appear to be retaining CO2.  Respiratory virus panel negative for Covid and influenza.  Blood cultures pending at this time.  INR lactic within normal limits.  Chest x-ray independently viewed myself.  No evidence of infiltrates.  Some hyperexpansion.  Consistent with COPD.  On reassessment patient continues to have extremely coarse lung sounds.  Her wheezing has abated however she still feels short of breath.  She has no chest pain at this time. I personally ambulated patient approximately 10 feet without oxygen she desaturated to 84% and became acutely anxious and short of breath.  Given patient's significant desaturation, coarse lung sounds, anxiety with oxygen cessation I discussed admission with patient.  She is agreeable to admission at this time.  I discussed this case with my attending physician who cosigned this note including patient's presenting symptoms, physical exam, and planned diagnostics and interventions. Attending physician stated agreement with plan or made changes to plan which were implemented.   Attending physician assessed patient at bedside.  Discussed admission with Dr. Olevia Bowens who will accept patient to hospital for COPD  exacerbation.  Final Clinical Impression(s) / ED Diagnoses Final diagnoses:  COPD exacerbation [facility])  Hypoxia    Rx / DC Orders ED Discharge Orders    None       Tedd Sias, [place] 06/09/19 1341    Pati Gallo [place], [place] 06/09/19 1342    Milton Ferguson, MD 06/10/19 [phone]

## 2019-06-09 NOTE — Progress Notes (Signed)
Subjective:   Patient ID: Gabrielle White, female    DOB: 28-Dec-1945 .   MRN: [id number]    Brief patient profile:  73  yowf  MM/ active smoker with AB/GOLD 0 copd by pfts 03/08/15 and GOLD II criteria 11/28/2017     History of Present Illness  01/22/2015 acute extended re-establish ov/Gabrielle White re:  AB/ still smoking  Chief Complaint  Patient presents with  . Pulmonary Consult    pt last seen in 2014. pt states DR. Green wanted her to follow up. pt states somethines her throat feels like it closes up and she cant breath. pt states she feels like she has to take really deep breaths. pt c/o chest tightness, and prod cough white in color.pt c/o thrush she thiks may come from the inhailers.     Last better breathing  x 4 weeks prior to OV  p using prednisone but benefit only for a few days p stopped despite maint rx with symbicort (though hfa poor)  Nl routine is symbicort 160 2 bid and maybe ventolin and occ brovana no recent duoneb need  In retrospect really hasn't been able to really stay well x 4 y Cough is harsh and congested worse day than noct  rec Start Prednisone 10mg  Take 4 for two days three for two days two for two days one for two days  Plan A =  Automatic = symbicort 160 Take 2 puffs first thing in am and then another 2 puffs about 12 hours later.  Work on Interior and spatial designer: Plan B = Backup  Only use your albuterol  Plan C = crisis  Only use nebulizer iprotropium-albuterol (duoneb) if you try the ventolin first and it fails to help > ok to use up to every 4 hours  Plan D = doctor call us if not improving  Plan E = ER > go there if all else fails Try prilosec otc 20mg   Take 30-60 min before first meal of the day and Pepcid ac (famotidine) 20 mg one @  bedtime    GERD (REFLUX) diet             01/24/2019  f/u ov/Gabrielle White re: GOLD II/ still smoking maint rx symb 160 2bid  Chief Complaint  Patient presents with  . Follow-up    Breathing may be slightly better  since the last visit- relates to cooler weather- using proair 2-3 x per day.   Dyspnea:  MMRC2 = can't walk a nl pace on a flat grade s sob but does fine slow and flat but by hour 10 wearing off symb and needs albuterol typically prior to pm dose of symb Cough: spells sporacic, white daytime mostly  Sleeping: sleeping on enough pillows to get 30 degrees  SABA use: as above  rec Plan A = Automatic = Always=    Breztri Take 2 puffs first thing in am and then another 2 puffs about 12 hours later.  Work on inhaler technique:    Plan B = Backup (to supplement plan A, not to replace it) Only use your albuterol inhaler as a rescue medication   Prednisone 10 mg take  4 each am x 2 days,   2 each am x 2 days,  1 each am x 2 days and stop  Please schedule a follow up office visit in 6 months, call sooner if needed     02/21/2019  f/u ov/Gabrielle White re: GOLD II/ still smoking/ problems with access to affordable  meds / does not have pred refillable as Plan C Chief Complaint  Patient presents with  . Follow-up    Patient is here for follow up for COPD. Insurance denied [employer].  Dyspnea:  Walking end of road and back now x 15-20 min s stopping / slow pace = MMRC2 = can't walk a nl pace on a flat grade s sob but does fine slow and flat  Cough: some sporadic esp in am / minimal mucoid sputum   Sleeping: no resp problems SABA use: avg twice daily at most, less while on breztri rec Plan A = Automatic = Always=    Breztri = Bevespi (one or the other)  Take 2 puffs first thing in am and then another 2 puffs about 12 hours later.  Plan B = Backup (to supplement plan A, not to replace it) Only use your albuterol inhaler  Add Plan C: crisis If not improving or needing more albuterol than usual : Prednisone 10 mg take  4 each am x 2 days,   2 each am x 2 days,  1 each am x 2 days and stop  The key is to stop smoking completely before smoking completely stops you!   03/17/2019  f/u ov/Gabrielle White re:  GOLD II copd/ still  smoking/ anoro and stiolto approved by [employer] but [place] her choice  will be available Apr 04 2019  And using symb 160  2bid Chief Complaint  Patient presents with  . Follow-up    Pt states she had a COPD exacerbation 5 nights ago after she believes she might have taken too much anoro. Since the exacerbation, pt stated she felt like a "zombie" but now pt said she is getting better. Pt has occ cough with white to yellow phlegm.  Dyspnea:  No longer doing any outdoor walking  Cough: none  Sleeping: no resp symptoms  SABA use: once or twice daily  02: none  rec Plan A = Automatic = Always=    Breztri Take 2 puffs first thing in am and then another 2 puffs about 12 hours later.  Work on inhaler technique:   Plan B = Backup (to supplement plan A, not to replace it) Only use your albuterol inhaler (proair)  as a rescue medication  Plan C = Crisis (instead of Plan B but only if Plan B stops working) - Prednisone 10 mg take  4 each am x 2 days,   2 each am x 2 days,  1 each am x 2 days and stop    06/09/2019  f/u ov/Gabrielle White re:  GOLD II copd/ still smoking / breztri  Chief Complaint  Patient presents with  . Follow-up    Increased SOB x 3 days. She has been coughing more over the past couple of days- minimal clear sputum.   Dyspnea: very sedentary this winter / now acutely sob with min activity In setting of severe congested cough but only minimal white mucus prod Sleeping: baseline fine, only 2 hours night prior to ov due sob  SABA use: 3-4 x per week then acutely increased x  3 days prior to OV   02: none Did not try prednisone as "plan C" since getting covid shot 06/11/19 and didn't want to interfere with that   No obvious day to day or daytime variability or assoc excess/ purulent sputum or mucus plugs or hemoptysis or cp or chest tightness, subjective wheeze or overt sinus or hb symptoms.     Also denies  any obvious fluctuation of symptoms with weather or environmental changes or  other aggravating or alleviating factors except as outlined above   No unusual exposure hx or h/o childhood pna/ asthma or knowledge of premature birth.  Current Allergies, Complete Past Medical History, Past Surgical History, Family History, and Social History were reviewed in [employer] record.  ROS  The following are not active complaints unless bolded Hoarseness, sore throat, dysphagia, dental problems, itching, sneezing,  nasal congestion or discharge of excess mucus or purulent secretions, ear ache,   fever, chills, sweats, unintended wt loss or wt gain, classically pleuritic or exertional cp,  orthopnea pnd or arm/hand swelling  or leg swelling, presyncope, palpitations, abdominal pain, anorexia, nausea, vomiting, diarrhea  or change in bowel habits or change in bladder habits, change in stools or change in urine, dysuria, hematuria,  rash, arthralgias, visual complaints, headache, numbness, weakness or ataxia or problems with walking or coordination,  change in mood or  memory.        Current Meds  Medication Sig  . albuterol (PROAIR HFA) 108 (90 Base) MCG/ACT inhaler INHALE 1 TO 2 PUFF INTO THE LUNGS EVERY 6 HOURS AS NEEDED FOR WHEEZING OR SHORTNESS OF BREATH  . ALPRAZolam (XANAX) 0.5 MG tablet TAKE 1 TABLET BY MOUTH TWICE DAILY AS NEEDED FOR NERVOUSNESS/SLEEP  . atorvastatin (LIPITOR) 40 MG tablet TAKE 1 TABLET BY MOUTH DAILY FOR HIGH CHOLESTEROL  . Budeson-Glycopyrrol-Formoterol (BREZTRI AEROSPHERE) 160-9-4.8 MCG/ACT AERO Inhale 2 puffs into the lungs 2 (two) times daily.  . Cholecalciferol (VITAMIN D3) 1000 UNITS CAPS Take 1 capsule by mouth daily.    Marland Kitchen levothyroxine (SYNTHROID) 125 MCG tablet Take 1 tablet (125 mcg total) by mouth daily.  Marland Kitchen losartan (COZAAR) 50 MG tablet TAKE 1 TABLET(50 MG) BY MOUTH DAILY  . sertraline (ZOLOFT) 100 MG tablet TAKE 1 TABLET BY MOUTH DAILY                 Objective:   Physical Exam  amb wf harsh hacking cough, sob at  rest    06/09/2019       157  03/17/2019   157 02/21/2019   154  01/24/2019   156  12/11/2018       157  01/22/2015      156 > 03/08/2015   162 > 06/07/2015  170 > 03/20/2016 160 > 05/15/2016   162 >   08/16/2016 163 > 11/20/2016  161 > 12/12/2016  162 > 05/23/2017  159 > 11/28/2017  159  > 03/11/2018  164 > 06/10/2018  161     01/16/13 156 lb (70.761 kg)  01/08/13 155 lb 3.2 oz (70.398 kg)  12/20/12 157 lb 9.6 oz (71.487 kg)      Vital signs reviewed  06/09/2019  - Note at rest 02 sats  88% on RA     HEENT : pt wearing mask not removed for exam due to covid - 19 concerns.    NECK :  without JVD/Nodes/TM/ nl carotid upstrokes bilaterally   LUNGS: no acc muscle use,  Mild barrel  contour chest wall with bilateral  Junky coarse pan exp  wheeze and  without cough on insp or exp maneuvers  and mild  Hyperresonant  to  percussion bilaterally     CV:  RRR  no s3 or murmur or increase in P2, and no edema   ABD:  soft and nontender with pos end  insp Hoover's  in the supine position. No bruits or  organomegaly appreciated, bowel sounds nl  MS:   Nl gait/  ext warm without deformities, calf tenderness, cyanosis or clubbing No obvious joint restrictions   SKIN: warm and dry without lesions    NEURO:  alert, approp, nl sensorium with  no motor or cerebellar deficits apparent.            I personally reviewed images and agree with radiology impression as follows:  CXR:   1. No acute cardiopulmonary disease. 2. Hyperexpanded lungs stable from the prior exam suggesting COPD.  Labs ordered/ reviewed:      Chemistry      Component Value Date/Time   NA 140 06/09/2019 1105   NA 141 08/27/2015 0821   K 4.0 06/09/2019 1105   CL 103 06/09/2019 1105   CO2 30 06/09/2019 1105   BUN 15 06/09/2019 1105   BUN 12 08/27/2015 0821   CREATININE 0.76 06/09/2019 1105   CREATININE 0.79 11/22/2018 1521      Component Value Date/Time   CALCIUM 9.4 06/09/2019 1105   ALKPHOS 118 06/09/2019 1105   AST 17  06/09/2019 1105   ALT 21 06/09/2019 1105   BILITOT 0.8 06/09/2019 1105   BILITOT 0.4 08/27/2015 0821        Lab Results  Component Value Date   WBC 11.3 (H) 06/09/2019   HGB 15.6 (H) 06/09/2019   HCT 49.8 (H) 06/09/2019   MCV 97.5 06/09/2019   PLT 322 06/09/2019                  Assessment & Plan:

## 2019-06-09 NOTE — ED Notes (Signed)
When ambulated without O2, levels went from 98% to 93%. When ambulated with O2, levels went from 98% to 95%. Nurse aware.

## 2019-06-10 ENCOUNTER — Encounter: Payer: Self-pay | Admitting: Internal Medicine

## 2019-06-10 DIAGNOSIS — F330 Major depressive disorder, recurrent, mild: Secondary | ICD-10-CM | POA: Diagnosis not present

## 2019-06-10 DIAGNOSIS — F419 Anxiety disorder, unspecified: Secondary | ICD-10-CM | POA: Diagnosis present

## 2019-06-10 DIAGNOSIS — J441 Chronic obstructive pulmonary disease with (acute) exacerbation: Secondary | ICD-10-CM | POA: Diagnosis present

## 2019-06-10 DIAGNOSIS — I10 Essential (primary) hypertension: Secondary | ICD-10-CM | POA: Diagnosis present

## 2019-06-10 DIAGNOSIS — R739 Hyperglycemia, unspecified: Secondary | ICD-10-CM | POA: Diagnosis present

## 2019-06-10 DIAGNOSIS — R0902 Hypoxemia: Secondary | ICD-10-CM | POA: Diagnosis present

## 2019-06-10 DIAGNOSIS — G4700 Insomnia, unspecified: Secondary | ICD-10-CM | POA: Diagnosis present

## 2019-06-10 DIAGNOSIS — E039 Hypothyroidism, unspecified: Secondary | ICD-10-CM | POA: Diagnosis present

## 2019-06-10 DIAGNOSIS — Z20822 Contact with and (suspected) exposure to covid-19: Secondary | ICD-10-CM | POA: Diagnosis present

## 2019-06-10 DIAGNOSIS — K219 Gastro-esophageal reflux disease without esophagitis: Secondary | ICD-10-CM | POA: Diagnosis present

## 2019-06-10 DIAGNOSIS — E785 Hyperlipidemia, unspecified: Secondary | ICD-10-CM | POA: Diagnosis present

## 2019-06-10 DIAGNOSIS — Z79890 Hormone replacement therapy (postmenopausal): Secondary | ICD-10-CM | POA: Diagnosis not present

## 2019-06-10 DIAGNOSIS — M1990 Unspecified osteoarthritis, unspecified site: Secondary | ICD-10-CM | POA: Diagnosis present

## 2019-06-10 DIAGNOSIS — Z8249 Family history of ischemic heart disease and other diseases of the circulatory system: Secondary | ICD-10-CM | POA: Diagnosis not present

## 2019-06-10 DIAGNOSIS — J9602 Acute respiratory failure with hypercapnia: Secondary | ICD-10-CM | POA: Insufficient documentation

## 2019-06-10 DIAGNOSIS — J9601 Acute respiratory failure with hypoxia: Secondary | ICD-10-CM | POA: Diagnosis present

## 2019-06-10 DIAGNOSIS — G43909 Migraine, unspecified, not intractable, without status migrainosus: Secondary | ICD-10-CM | POA: Diagnosis present

## 2019-06-10 DIAGNOSIS — D751 Secondary polycythemia: Secondary | ICD-10-CM | POA: Diagnosis present

## 2019-06-10 DIAGNOSIS — Z885 Allergy status to narcotic agent status: Secondary | ICD-10-CM | POA: Diagnosis not present

## 2019-06-10 DIAGNOSIS — F17210 Nicotine dependence, cigarettes, uncomplicated: Secondary | ICD-10-CM | POA: Diagnosis present

## 2019-06-10 DIAGNOSIS — Z79899 Other long term (current) drug therapy: Secondary | ICD-10-CM | POA: Diagnosis not present

## 2019-06-10 DIAGNOSIS — T380X5A Adverse effect of glucocorticoids and synthetic analogues, initial encounter: Secondary | ICD-10-CM | POA: Diagnosis present

## 2019-06-10 DIAGNOSIS — Z888 Allergy status to other drugs, medicaments and biological substances status: Secondary | ICD-10-CM | POA: Diagnosis not present

## 2019-06-10 DIAGNOSIS — F329 Major depressive disorder, single episode, unspecified: Secondary | ICD-10-CM | POA: Diagnosis present

## 2019-06-10 LAB — CBC
HCT: 45.3 % (ref 36.0–46.0)
Hemoglobin: 14.5 g/dL (ref 12.0–15.0)
MCH: 30.9 pg (ref 26.0–34.0)
MCHC: 32 g/dL (ref 30.0–36.0)
MCV: 96.6 fL (ref 80.0–100.0)
Platelets: 316 10*3/uL (ref 150–400)
RBC: 4.69 MIL/uL (ref 3.87–5.11)
RDW: 13.2 % (ref 11.5–15.5)
WBC: 11.6 10*3/uL — ABNORMAL HIGH (ref 4.0–10.5)
nRBC: 0 % (ref 0.0–0.2)

## 2019-06-10 LAB — BASIC METABOLIC PANEL
Anion gap: 9 (ref 5–15)
BUN: 16 mg/dL (ref 8–23)
CO2: 26 mmol/L (ref 22–32)
Calcium: 9.2 mg/dL (ref 8.9–10.3)
Chloride: 105 mmol/L (ref 98–111)
Creatinine, Ser: 0.66 mg/dL (ref 0.44–1.00)
GFR calc Af Amer: >60 mL/min (ref >60)
GFR calc non Af Amer: >60 mL/min (ref >60)
Glucose, Bld: 133 mg/dL — ABNORMAL HIGH (ref 70–99)
Potassium: 4 mmol/L (ref 3.5–5.1)
Sodium: 140 mmol/L (ref 135–145)

## 2019-06-10 MED ORDER — ADULT MULTIVITAMIN W/MINERALS CH
1.0000 | ORAL_TABLET | Freq: Every day | ORAL | Status: DC
  Administered 2019-06-10 – 2019-06-15 (×6): 1 via ORAL
  Filled 2019-06-10 (×6): qty 1

## 2019-06-10 MED ORDER — PRO-STAT SUGAR FREE PO LIQD
30.0000 mL | Freq: Two times a day (BID) | ORAL | Status: DC
  Administered 2019-06-10: 30 mL via ORAL
  Filled 2019-06-10 (×9): qty 30

## 2019-06-10 MED ORDER — AZITHROMYCIN 250 MG PO TABS
500.0000 mg | ORAL_TABLET | Freq: Every day | ORAL | Status: AC
  Administered 2019-06-10: 500 mg via ORAL
  Filled 2019-06-10: qty 2

## 2019-06-10 MED ORDER — BENZONATATE 100 MG PO CAPS
100.0000 mg | ORAL_CAPSULE | Freq: Two times a day (BID) | ORAL | Status: DC | PRN
  Administered 2019-06-10 – 2019-06-14 (×3): 100 mg via ORAL
  Filled 2019-06-10 (×4): qty 1

## 2019-06-10 MED ORDER — AZITHROMYCIN 250 MG PO TABS
250.0000 mg | ORAL_TABLET | Freq: Every day | ORAL | Status: AC
  Administered 2019-06-11 – 2019-06-14 (×4): 250 mg via ORAL
  Filled 2019-06-10 (×4): qty 1

## 2019-06-10 NOTE — Assessment & Plan Note (Addendum)
Active smoker - PFTs  11/18/10  FEV1  2.0 (95%) ratio 73 and no change p saba and DLCO 75 corrects to 100% -PFT's  03/08/2015  FEV1 1.85 (78 % ) ratio 73  p 5 % improvement from saba with DLCO  63 % corrects to 72 % for alv volume and erv 17   - added flutter 06/07/2015  - PFT's  08/16/2016  FEV1 1.42 (61 % ) ratio 71  p 8 % improvement FEV1 (and 20% FVC) from saba p symb 160  prior to study with DLCO  61/58 % corrects to 78 % for alv volume    - Allergy profile 08/16/2016 >  Eos 0.5 /  IgE  99 RAST neg -  ADDED prednisone x 6 days cycles 11/20/2016  - 05/23/2017  After extensive coaching HFA effectiveness =    90% - PFT's  11/28/2017  FEV1 1.27 (57 % ) ratio 63  p 6 % improvement from saba p symb x 160 x 2  prior to study with DLCO  61 % corrects to 81  % for alv volume   - alpha one AT screen 06/10/2018    MM level 141   - 01/24/2019    try Breztri 2bid and pred x 6 days > much better 02/21/2019  but can't afford so changed to bevespi plus prn pred for flares  - 03/17/2019  After extensive coaching inhaler device,  effectiveness =    90% > tryi beztri    Onset 06/06/19 c/b acute hypercarbic/hypoxemic resp failure   Far too sick for outpt rx at this point > directed to ER       Total time for H and P, chart review, counseling, teaching device and generating customized AVS unique to this acute office visit / charting = 30 min

## 2019-06-10 NOTE — Evaluation (Addendum)
Physical Therapy Evaluation Patient Details Name: Gabrielle White MRN: [id number] DOB: 1945-08-09 Today's Date: 06/10/2019   History of Present Illness  74 yo female admitted with COPD exac. Hx of COPD, anxiety, depression, OA  Clinical Impression  On eval, pt was Min guard-Min assist for mobility. She walked ~150 feet without a device. She is unsteady but there was no overt LOB during session today. Recommend OP PT for strength and balance training. Will continue to follow and progress activity as tolerated.     Follow Up Recommendations Outpatient PT    Equipment Recommendations  None recommended by PT    Recommendations for Other Services       Precautions / Restrictions Precautions Precautions: Fall Precaution Comments: monitor O2; hx of falls Restrictions Weight Bearing Restrictions: No      Mobility  Bed Mobility Overal bed mobility: Modified Independent                Transfers Overall transfer level: Needs assistance   Transfers: Sit to/from Stand Sit to Stand: Supervision         General transfer comment: unsteady.  Ambulation/Gait Ambulation/Gait assistance: Min assist;Min guard Gait Distance (Feet): 150 Feet Assistive device: None Gait Pattern/deviations: Step-through pattern;Decreased stride length     General Gait Details: unsteady. pt stated she feels "wobbly". she has a fear of falling. O2 90% on 1L [place].  Stairs            Wheelchair Mobility    Modified Rankin (Stroke Patients Only)       Balance Overall balance assessment: History of Falls;Needs assistance           Standing balance-Leahy Scale: Fair                               Pertinent Vitals/Pain Pain Assessment: No/denies pain    Home Living Family/patient expects to be discharged to:: Private residence Living Arrangements: Alone   Type of Home: Apartment       Home Layout: Bed/bath upstairs;Two level Home Equipment: None      Prior  Function Level of Independence: Independent               Hand Dominance        Extremity/Trunk Assessment   Upper Extremity Assessment Upper Extremity Assessment: Overall WFL for tasks assessed    Lower Extremity Assessment Lower Extremity Assessment: Overall WFL for tasks assessed    Cervical / Trunk Assessment Cervical / Trunk Assessment: Normal  Communication   Communication: No difficulties  Cognition Arousal/Alertness: Awake/alert Behavior During Therapy: WFL for tasks assessed/performed Overall Cognitive Status: Within Functional Limits for tasks assessed                                        General Comments      Exercises     Assessment/Plan    PT Assessment Patient needs continued PT services  PT Problem List Decreased mobility;Decreased activity tolerance;Decreased balance       PT Treatment Interventions Gait training;DME instruction;Therapeutic activities;Balance training;Therapeutic exercise;Patient/family education;Functional mobility training    PT Goals (Current goals can be found in the Care Plan section)  Acute Rehab PT Goals Patient Stated Goal: to improve balance and avoid falls PT Goal Formulation: With patient Time For Goal Achievement: 06/24/19 Potential to Achieve Goals: Good    Frequency  Min 3X/week   Barriers to discharge        Co-evaluation               AM-PAC PT "6 Clicks" Mobility  Outcome Measure Help needed turning from your back to your side while in a flat bed without using bedrails?: None Help needed moving from lying on your back to sitting on the side of a flat bed without using bedrails?: None Help needed moving to and from a bed to a chair (including a wheelchair)?: A Little Help needed standing up from a chair using your arms (e.g., wheelchair or bedside chair)?: A Little Help needed to walk in hospital room?: A Little Help needed climbing 3-5 steps with a railing? : A Little 6  Click Score: 20    End of Session Equipment Utilized During Treatment: Gait belt;Oxygen Activity Tolerance: Patient limited by fatigue Patient left: in chair;with call bell/phone within reach;with chair alarm set   PT Visit Diagnosis: History of falling (Z91.81);Difficulty in walking, not elsewhere classified (R26.2);Unsteadiness on feet (R26.81)    Time: [id number] PT Time Calculation (min) (ACUTE ONLY): 20 min   Charges:   PT Evaluation $PT Eval Low Complexity: 1 Low             Robin Petrakis P, PT Acute Rehabilitation

## 2019-06-10 NOTE — Assessment & Plan Note (Addendum)
Admit 06/09/2019 with  PH  7.30/  PC02 58 /P02 48 RA  Serum bicarb suggests she was bordeline hypercarbic at baseline but hct suggests she is chronically hypoxemic and suspect she'll need 02 at d/c depending on response to rx

## 2019-06-10 NOTE — Progress Notes (Signed)
PROGRESS NOTE    Gabrielle White    Code Status: Full Code  [id number] DOB: 30-Sep-1945 DOA: 06/09/2019 LOS: 0 days  PCP: Lauree Chandler, NP CC:  Chief Complaint  Patient presents with  . Shortness of Breath       Hospital Summary   This is a 74 year old female with history of COPD/emphysema, anxiety, depression, insomnia, hypertension, hyperlipidemia, tobacco abuse who presented to the ED from her pulmonologist office (Dr. Melvyn Novas) due to progressively worsening dyspnea x4 to 5 days with associated wheezing without responding to albuterol at home.  Also increased sputum production.  ED Course: Initial vital signs temperature 98.9 F, pulse 92, respirations 22, blood pressure 132/81 mmHg and O2 sat 91% on room air.  The patient was given bronchodilator and 60 mg of prednisone.  She became hypoxic and tachypneic with ambulation, so our service was asked to evaluate for further treatment. CBC shows white count 11.3, hemoglobin 15.6 g/dL and platelets 322.  PT 12.3 and INR 0.9.  Venous blood gas showed a decreased PO2 of 48.30 mmHg and bicarbonate of 29.7 mmol/L.  CMP showed a glucose of 121 mg/dL, but was otherwise normal.  Lactic acid was 1.1 mmol/L.  SARS coronavirus 2, influenza A and B by PCR was negative.  Her chest radiograph did not have any acute cardiopulmonary disease, but showed hyperinflation compatible with emphysema.    A & P   Principal Problem:   Acute respiratory failure with hypoxemia (HCC) Active Problems:   Cigarette smoker   Hyperlipidemia   Hypothyroidism   Hyperglycemia   Essential hypertension   Depression   Polycythemia   COPD exacerbation ([place])   1. Acute hypoxic respiratory failure secondary to COPD exacerbation a. On Breztri at home b. Respiratory panel negative c. Continue DuoNeb every 6 hours and albuterol neb every 4 hours as needed d. IV Solu-Medrol changed to prednisone, continue for 5 days total steroids.  Leukocytosis likely  steroid-induced e. will add on azithromycin x 5 days f. Incentive spirometry and flutter valve g. Tessalon Perles as needed for persistent cough  h. continue Protonix, discontinue Pepcid 2. Hypertension on losartan 3. Polycythemia secondary to COPD/tobacco use a. Tobacco cessation advised 4. Hyperlipidemia on atorvastatin 5. Hypothyroidism on levothyroxine 6. Tobacco use, as above 7. Depression on sertraline 8. GERD a. Continue Protonix, discontinue Pepcid  DVT prophylaxis: Lovenox Family Communication: Called patient's friend at her request who did not answer phone Disposition Plan:   Patient came from:   Home                                                                                          Anticipated d/c place: Home  Barriers to d/c: Improved respiratory status  Pressure injury documentation    None  Consultants  None  Procedures  None  Antibiotics   Anti-infectives (From admission, onward)   None        Subjective   Seen and examined at bedside no acute distress resting comfortably.  States she is still having some shortness of breath and wheezing.  Denies chest pain, nausea or vomiting.  No other complaints at this  time.  Objective   Vitals:   06/10/19 0744 06/10/19 0800 06/10/19 1422 06/10/19 1453  BP:    (!) 142/65  Pulse:    (!) 110  Resp:  (!) 21  18  Temp:    97.9 F (36.6 C)  TempSrc:    Oral  SpO2: 97%  92% 91%  Weight:      Height:        Intake/Output Summary (Last 24 hours) at 06/10/2019 1639 Last data filed at 06/09/2019 1907 Gross per 24 hour  Intake --  Output 300 ml  Net -300 ml   Filed Weights   06/09/19 1041  Weight: 71.2 kg    Examination:  Physical Exam Vitals and nursing note reviewed.  Constitutional:      Appearance: Normal appearance.  HENT:     Head: Normocephalic and atraumatic.  Eyes:     Conjunctiva/sclera: Conjunctivae normal.  Cardiovascular:     Rate and Rhythm: Normal rate and regular rhythm.   Pulmonary:     Comments: Diffuse expiratory wheezing On nasal cannula Some conversational dyspnea Abdominal:     General: Abdomen is flat.     Palpations: Abdomen is soft.  Musculoskeletal:        General: No swelling or tenderness.  Skin:    Coloration: Skin is not jaundiced or pale.  Neurological:     Mental Status: She is alert. Mental status is at baseline.  Psychiatric:        Mood and Affect: Mood normal.        Behavior: Behavior normal.     Data Reviewed: I have personally reviewed following labs and imaging studies  CBC: Recent Labs  Lab 06/09/19 1105 06/10/19 0805  WBC 11.3* 11.6*  NEUTROABS 7.6  --   HGB 15.6* 14.5  HCT 49.8* 45.3  MCV 97.5 96.6  PLT 322 [id number]   Basic Metabolic Panel: Recent Labs  Lab 06/09/19 1105 06/10/19 0805  NA 140 140  K 4.0 4.0  CL 103 105  CO2 30 26  GLUCOSE 121* 133*  BUN 15 16  CREATININE 0.76 0.66  CALCIUM 9.4 9.2  MG 2.1  --   PHOS 4.0  --    GFR: Estimated Creatinine Clearance: 61.1 mL/min (by C-G formula based on SCr of 0.66 mg/dL). Liver Function Tests: Recent Labs  Lab 06/09/19 1105  AST 17  ALT 21  ALKPHOS 118  BILITOT 0.8  PROT 7.5  ALBUMIN 4.2   No results for input(s): LIPASE, AMYLASE in the last 168 hours. No results for input(s): AMMONIA in the last 168 hours. Coagulation Profile: Recent Labs  Lab 06/09/19 1105  INR 0.9   Cardiac Enzymes: No results for input(s): CKTOTAL, CKMB, CKMBINDEX, TROPONINI in the last 168 hours. BNP (last 3 results) No results for input(s): PROBNP in the last 8760 hours. HbA1C: No results for input(s): HGBA1C in the last 72 hours. CBG: No results for input(s): GLUCAP in the last 168 hours. Lipid Profile: No results for input(s): CHOL, HDL, LDLCALC, TRIG, CHOLHDL, LDLDIRECT in the last 72 hours. Thyroid Function Tests: No results for input(s): TSH, T4TOTAL, FREET4, T3FREE, THYROIDAB in the last 72 hours. Anemia Panel: No results for input(s): VITAMINB12,  FOLATE, FERRITIN, TIBC, IRON, RETICCTPCT in the last 72 hours. Sepsis Labs: Recent Labs  Lab 06/09/19 1105  LATICACIDVEN 1.1    Recent Results (from the past 240 hour(s))  Culture, blood (Routine x 2)     Status: None (Preliminary result)   Collection Time: 06/09/19  11:05 AM   Specimen: BLOOD  Result Value Ref Range Status   Specimen Description   Final    BLOOD LEFT ANTECUBITAL Performed at [facility] [address], [place], [place] [address]    Special Requests   Final    BOTTLES DRAWN AEROBIC AND ANAEROBIC Blood Culture adequate volume Performed at [facility] [address] [place], [place] [address]    Culture   Final    NO GROWTH 1 DAY Performed at [facility], [place] [address], [place], [place] [address]    Report Status PENDING  Incomplete  Culture, blood (Routine x 2)     Status: None (Preliminary result)   Collection Time: 06/09/19 11:13 AM   Specimen: BLOOD  Result Value Ref Range Status   Specimen Description   Final    BLOOD RIGHT ANTECUBITAL Performed at [facility] [address], [place], [place] [address]    Special Requests   Final    BOTTLES DRAWN AEROBIC AND ANAEROBIC Blood Culture adequate volume Performed at [facility] [address], [place],  [address]    Culture   Final    NO GROWTH 1 DAY Performed at [facility], [place] [address], [place],  [address]    Report Status PENDING  Incomplete  Respiratory Panel by RT PCR (Flu A&B, Covid) - Nasopharyngeal Swab     Status: None   Collection Time: 06/09/19 12:15 PM   Specimen: Nasopharyngeal Swab  Result Value Ref Range Status   SARS Coronavirus 2 by RT PCR NEGATIVE NEGATIVE Final    Comment: (NOTE) SARS-CoV-2 target nucleic acids are NOT DETECTED. The SARS-CoV-2 RNA is generally detectable in upper respiratoy specimens during the acute phase of infection. The lowest concentration of  SARS-CoV-2 viral copies this assay can detect is 131 copies/mL. A negative result does not preclude SARS-Cov-2 infection and should not be used as the sole basis for treatment or other patient management decisions. A negative result may occur with  improper specimen collection/handling, submission of specimen other than nasopharyngeal swab, presence of viral mutation(s) within the areas targeted by this assay, and inadequate number of viral copies (<131 copies/mL). A negative result must be combined with clinical observations, patient history, and epidemiological information. The expected result is Negative. Fact Sheet for Patients:  PinkCheek.be Fact Sheet for Healthcare Providers:  GravelBags.it This test is not yet ap proved or cleared by the [place] FDA and  has been authorized for detection and/or diagnosis of SARS-CoV-2 by FDA under an Emergency Use Authorization (EUA). This EUA will remain  in effect (meaning this test can be used) for the duration of the COVID-19 declaration under Section 564(b)(1) of the Act, 21 U.S.C. section 360bbb-3(b)(1), unless the authorization is terminated or revoked sooner.    Influenza A by PCR NEGATIVE NEGATIVE Final   Influenza B by PCR NEGATIVE NEGATIVE Final    Comment: (NOTE) The Xpert Xpress SARS-CoV-2/FLU/RSV assay is intended as an aid in  the diagnosis of influenza from Nasopharyngeal swab specimens and  should not be used as a sole basis for treatment. Nasal washings and  aspirates are unacceptable for Xpert Xpress SARS-CoV-2/FLU/RSV  testing. Fact Sheet for Patients: PinkCheek.be Fact Sheet for Healthcare Providers: GravelBags.it This test is not yet approved or cleared by the [place] FDA and  has been authorized for detection and/or diagnosis of SARS-CoV-2 by  FDA under an Emergency Use Authorization (EUA).  This EUA  will remain  in effect (meaning this test can be used) for the duration of the  Covid-19 declaration under Section 564(b)(1) of the Act, 21  U.S.C. section 360bbb-3(b)(1), unless the authorization is  terminated or revoked. Performed at [facility], [place] [address], [place], [place] [address]          Radiology Studies: DG Chest 2 View  Result Date: 06/09/2019 CLINICAL DATA:  Shortness of breath worsening since 2 days ago. EXAM: CHEST - 2 VIEW COMPARISON:  05/07/2017 FINDINGS: Cardiac silhouette is normal in size. No mediastinal or hilar masses. No evidence of adenopathy. Lungs are hyperexpanded, but clear. No pleural effusion or pneumothorax. Skeletal structures are intact. IMPRESSION: 1. No acute cardiopulmonary disease. 2. Hyperexpanded lungs stable from the prior exam suggesting COPD. Electronically Signed   By: Lajean Manes M.D.   On: 06/09/2019 12:13        Scheduled Meds: . atorvastatin  40 mg Oral q1800  . enoxaparin (LOVENOX) injection  40 mg Subcutaneous Q24H  . famotidine  20 mg Oral QHS  . feeding supplement (PRO-STAT SUGAR FREE 64)  30 mL Oral BID BM  . ipratropium-albuterol  3 mL Nebulization Q6H  . levothyroxine  125 mcg Oral Daily  . losartan  50 mg Oral QPM  . multivitamin with minerals  1 tablet Oral Daily  . pantoprazole  40 mg Oral QAC breakfast  . predniSONE  40 mg Oral Q breakfast  . sertraline  100 mg Oral QHS   Continuous Infusions:   Time spent: 25 minutes with over 50% of the time coordinating the patient's care    Harold Hedge, DO [facility] Pager [phone]  Call night coverage person covering after 7pm

## 2019-06-10 NOTE — Progress Notes (Signed)
  Message sent to Dr Neysa Bonito  I have given Ms Gabrielle White DM 10ml prn at 1051, pt still having a lot of coughing spells, cough sounds congested but not coughing up anything. she's getting exerted from all the coughing.   Requested if she could  have any other med that may help her? (RT here and suggested a flutter valve too, requested order for the flutter valve and RT will give it to her)    RT in and giving pt a HHN at this time as well.

## 2019-06-10 NOTE — Progress Notes (Addendum)
Initial Nutrition Assessment  DOCUMENTATION CODES:   Not applicable  INTERVENTION:  - will order 30 mL Prostat BID, each supplement provides 100 kcal and 15 grams of protein. - will order daily multivitamin with minerals. - continue to encourage PO intakes.    NUTRITION DIAGNOSIS:   Increased nutrient needs related to acute illness as evidenced by estimated needs.  GOAL:   Patient will meet greater than or equal to 90% of their needs  MONITOR:   PO intake, Supplement acceptance, Labs, Weight trends  REASON FOR ASSESSMENT:   Consult Assessment of nutrition requirement/status  ASSESSMENT:   74 y.o. female with medical history of COPD/emphysema, anxiety, depression, insomnia, ankle and tarsus enthesopathy, HTN, hyperlipidemia, osteoarthrosis, palpitations, [facility] spotted fever treated with doxycycline, and ongoing tobacco abuse. She presented to the ED from Pulmonologist's office with progressively worsening dyspnea x4-5 days with associated wheezing, increased sputum production, a few days of heart palpitations.  No intakes documented since admission. Patient reports good appetite now and PTA with no changes, no chewing or swallowing difficulties, no abdominal pain/pressure, nausea, or vomiting with PO intakes now or PTA. She denies any recent weight changes. Per chart review, weight 3/8 was 157 lb and weight has been stable for the past 13 months.   Per notes: - COPD exacerbation - ongoing cigarette smoking   Labs reviewed. Medications reviewed; 20 mg oral pepcid/day, 125 mcg oral synthroid/day, 40 mg oral protonix/day, 40 mg deltasone/day.      NUTRITION - FOCUSED PHYSICAL EXAM:  completed; no muscle or fat wasting.   Diet Order:   Diet Order            Diet Heart Room service appropriate? Yes; Fluid consistency: Thin  Diet effective now              EDUCATION NEEDS:   No education needs have been identified at this time  Skin:  Skin Assessment:  Reviewed RN Assessment  Last BM:  3/8  Height:   Ht Readings from Last 1 Encounters:  06/09/19 5\' 5"  (1.651 m)    Weight:   Wt Readings from Last 1 Encounters:  06/09/19 71.2 kg    Ideal Body Weight:  56.8 kg  BMI:  Body mass index is 26.13 kg/m.  Estimated Nutritional Needs:   Kcal:  1800-2000 kcal  Protein:  75-90 grams  Fluid:  >/=1.8 L/day      Jarome Matin, MS, RD, LDN, CNSC Inpatient Clinical Dietitian RD pager # available in [facility]  After hours/weekend pager # available in [facility]

## 2019-06-11 DIAGNOSIS — E039 Hypothyroidism, unspecified: Secondary | ICD-10-CM

## 2019-06-11 DIAGNOSIS — F17210 Nicotine dependence, cigarettes, uncomplicated: Secondary | ICD-10-CM

## 2019-06-11 DIAGNOSIS — F330 Major depressive disorder, recurrent, mild: Secondary | ICD-10-CM

## 2019-06-11 DIAGNOSIS — J441 Chronic obstructive pulmonary disease with (acute) exacerbation: Principal | ICD-10-CM

## 2019-06-11 DIAGNOSIS — D751 Secondary polycythemia: Secondary | ICD-10-CM

## 2019-06-11 DIAGNOSIS — E7849 Other hyperlipidemia: Secondary | ICD-10-CM

## 2019-06-11 LAB — BASIC METABOLIC PANEL
Anion gap: 7 (ref 5–15)
BUN: 26 mg/dL — ABNORMAL HIGH (ref 8–23)
CO2: 26 mmol/L (ref 22–32)
Calcium: 8.5 mg/dL — ABNORMAL LOW (ref 8.9–10.3)
Chloride: 106 mmol/L (ref 98–111)
Creatinine, Ser: 0.73 mg/dL (ref 0.44–1.00)
GFR calc Af Amer: >60 mL/min (ref >60)
GFR calc non Af Amer: >60 mL/min (ref >60)
Glucose, Bld: 114 mg/dL — ABNORMAL HIGH (ref 70–99)
Potassium: 3.6 mmol/L (ref 3.5–5.1)
Sodium: 139 mmol/L (ref 135–145)

## 2019-06-11 LAB — CBC
HCT: 43.5 % (ref 36.0–46.0)
Hemoglobin: 14 g/dL (ref 12.0–15.0)
MCH: 31.5 pg (ref 26.0–34.0)
MCHC: 32.2 g/dL (ref 30.0–36.0)
MCV: 98 fL (ref 80.0–100.0)
Platelets: 286 10*3/uL (ref 150–400)
RBC: 4.44 MIL/uL (ref 3.87–5.11)
RDW: 13.5 % (ref 11.5–15.5)
WBC: 14.3 10*3/uL — ABNORMAL HIGH (ref 4.0–10.5)
nRBC: 0 % (ref 0.0–0.2)

## 2019-06-11 MED ORDER — IPRATROPIUM-ALBUTEROL 0.5-2.5 (3) MG/3ML IN SOLN
3.0000 mL | RESPIRATORY_TRACT | Status: DC
  Administered 2019-06-11 – 2019-06-15 (×25): 3 mL via RESPIRATORY_TRACT
  Filled 2019-06-11 (×27): qty 3

## 2019-06-11 MED ORDER — SALINE SPRAY 0.65 % NA SOLN
1.0000 | NASAL | Status: DC | PRN
  Filled 2019-06-11: qty 44

## 2019-06-11 MED ORDER — ALBUTEROL SULFATE (2.5 MG/3ML) 0.083% IN NEBU
2.5000 mg | INHALATION_SOLUTION | RESPIRATORY_TRACT | Status: DC | PRN
  Administered 2019-06-11: 2.5 mg via RESPIRATORY_TRACT
  Filled 2019-06-11: qty 3

## 2019-06-11 NOTE — Progress Notes (Addendum)
PROGRESS NOTE  Gabrielle White [id number] DOB: 12-12-1945 DOA: 06/09/2019 PCP: Lauree Chandler, NP   LOS: 1 day   Brief narrative: As per HPI,  Patient is 74 year old female with history of COPD/emphysema, anxiety, depression, insomnia, hypertension, hyperlipidemia, tobacco abuse who presented to the ED from her pulmonologist office (Dr. Melvyn Novas) due to progressively worsening dyspnea for 4 to 5 days with associated wheezing without responding to albuterol at home.  Patient also had increased sputum production. ED Course:Initial vital signs temperature 98.9 F, pulse 92, respirations 22, blood pressure 132/81 mmHg and O2 sat 91% on room air. The patient was given bronchodilator and 60 mg of prednisone. She became hypoxic and tachypneic with ambulation, so our service was asked to evaluate for further treatment. CBC shows white count 11.3, hemoglobin 15.6 g/dL and platelets 322. PT 12.3 and INR 0.9. Venous blood gas showed a decreased PO2 of 48.30 mmHg and bicarbonate of 29.7 mmol/L. CMP showed a glucose of 121 mg/dL, but was otherwise normal. Lactic acid was 1.1 mmol/L. SARS coronavirus 2, influenza A and B by PCR was negative. Her chest radiograph did not have any acute cardiopulmonary disease, but showed hyperinflation compatible with emphysema.   Assessment/Plan:  Principal Problem:   Acute respiratory failure with hypoxemia (HCC) Active Problems:   Cigarette smoker   Hyperlipidemia   Hypothyroidism   Hyperglycemia   Essential hypertension   Depression   Polycythemia   COPD exacerbation (HCC)  Acute hypoxic respiratory failure secondary to COPD exacerbation Still on supplemental oxygen.  Patient is on Breztri at home.  Continue with duo nebs, prednisone azithromycinm incentive spirometry and flutter valve.  Continue Protonix.  Respiratory viral panel was negative.  Might need oxygen on discharge.  Essential hypertension on losartan.  Will closely monitor blood  pressure  Polycythemia secondary to COPD/tobacco use.  Patient was encouraged against tobacco smoking.  Hyperlipidemia on atorvastatin  Hypothyroidism on levothyroxine  Depression on sertraline  GERD Continue Protonix, discontinue Pepcid  VTE Prophylaxis: Lovenox subcu  Code Status: Full code  Family Communication: None today  Disposition Plan:  . Patient is from home . Likely disposition to home likely by tomorrow . Barriers to discharge: Still on supplemental oxygen.  Will assess for oxygen requirement prior to discharge.  Wean oxygen as tolerated   Consultants:  None  Procedures:  None  Antibiotics:  . Azithromycin  Anti-infectives (From admission, onward)   Start     Dose/Rate Route Frequency Ordered Stop   06/11/19 1000  azithromycin (ZITHROMAX) tablet 250 mg     250 mg Oral Daily 06/10/19 1642 06/15/19 0959   06/10/19 1645  azithromycin (ZITHROMAX) tablet 500 mg     500 mg Oral Daily 06/10/19 1642 06/10/19 1723      Subjective: Today, patient was seen and examined at bedside.  Patient still feels short winded, dyspneic.  She feels her fatigue tired and was unable to sleep yesterday.  She did have an episode of respiratory distress yesterday.  Objective: Vitals:   06/11/19 1120 06/11/19 1326  BP:  115/63  Pulse:  84  Resp:  18  Temp:  98.6 F (37 C)  SpO2: 95% 96%    Intake/Output Summary (Last 24 hours) at 06/11/2019 1423 Last data filed at 06/11/2019 0930 Gross per 24 hour  Intake 240 ml  Output --  Net 240 ml   Filed Weights   06/09/19 1041  Weight: 71.2 kg   Body mass index is 26.13 kg/m.   Physical Exam: GENERAL: Patient  is alert awake and oriented. Not in obvious distress.  Nasal cannula oxygen at 3 L/min. HENT: No scleral pallor or icterus. Pupils equally reactive to light. Oral mucosa is moist NECK: is supple, no gross swelling noted. CHEST:   Diminished breath sounds bilaterally.  Mild expiratory wheezing CVS: S1 and S2  heard, no murmur. Regular rate and rhythm.  ABDOMEN: Soft, non-tender, bowel sounds are present. EXTREMITIES: No edema. CNS: Cranial nerves are intact. No focal motor deficits. SKIN: warm and dry without rashes.  Data Review: I have personally reviewed the following laboratory data and studies,  CBC: Recent Labs  Lab 06/09/19 1105 06/10/19 0805 06/11/19 0540  WBC 11.3* 11.6* 14.3*  NEUTROABS 7.6  --   --   HGB 15.6* 14.5 14.0  HCT 49.8* 45.3 43.5  MCV 97.5 96.6 98.0  PLT 322 316 [id number]   Basic Metabolic Panel: Recent Labs  Lab 06/09/19 1105 06/10/19 0805 06/11/19 0540  NA 140 140 139  K 4.0 4.0 3.6  CL 103 105 106  CO2 30 26 26   GLUCOSE 121* 133* 114*  BUN 15 16 26*  CREATININE 0.76 0.66 0.73  CALCIUM 9.4 9.2 8.5*  MG 2.1  --   --   PHOS 4.0  --   --    Liver Function Tests: Recent Labs  Lab 06/09/19 1105  AST 17  ALT 21  ALKPHOS 118  BILITOT 0.8  PROT 7.5  ALBUMIN 4.2   No results for input(s): LIPASE, AMYLASE in the last 168 hours. No results for input(s): AMMONIA in the last 168 hours. Cardiac Enzymes: No results for input(s): CKTOTAL, CKMB, CKMBINDEX, TROPONINI in the last 168 hours. BNP (last 3 results) No results for input(s): BNP in the last 8760 hours.  ProBNP (last 3 results) No results for input(s): PROBNP in the last 8760 hours.  CBG: No results for input(s): GLUCAP in the last 168 hours. Recent Results (from the past 240 hour(s))  Culture, blood (Routine x 2)     Status: None (Preliminary result)   Collection Time: 06/09/19 11:05 AM   Specimen: BLOOD  Result Value Ref Range Status   Specimen Description   Final    BLOOD LEFT ANTECUBITAL Performed at [facility] [address], [place], [place] [address]    Special Requests   Final    BOTTLES DRAWN AEROBIC AND ANAEROBIC Blood Culture adequate volume Performed at [facility] [address], [place], [place] [address]    Culture   Final    NO  GROWTH 2 DAYS Performed at [facility] [address], [place], [place] [address]    Report Status PENDING  Incomplete  Culture, blood (Routine x 2)     Status: None (Preliminary result)   Collection Time: 06/09/19 11:13 AM   Specimen: BLOOD  Result Value Ref Range Status   Specimen Description   Final    BLOOD RIGHT ANTECUBITAL Performed at [facility] [address] [place], [place] [address]    Special Requests   Final    BOTTLES DRAWN AEROBIC AND ANAEROBIC Blood Culture adequate volume Performed at [facility] [address], [place], [place] [address]    Culture   Final    NO GROWTH 2 DAYS Performed at [facility] [address], [place], [place] [address]    Report Status PENDING  Incomplete  Respiratory Panel by RT PCR (Flu A&B, Covid) - Nasopharyngeal Swab  Status: None   Collection Time: 06/09/19 12:15 PM   Specimen: Nasopharyngeal Swab  Result Value Ref Range Status   SARS Coronavirus 2 by RT PCR NEGATIVE NEGATIVE Final    Comment: (NOTE) SARS-CoV-2 target nucleic acids are NOT DETECTED. The SARS-CoV-2 RNA is generally detectable in upper respiratoy specimens during the acute phase of infection. The lowest concentration of SARS-CoV-2 viral copies this assay can detect is 131 copies/mL. A negative result does not preclude SARS-Cov-2 infection and should not be used as the sole basis for treatment or other patient management decisions. A negative result may occur with  improper specimen collection/handling, submission of specimen other than nasopharyngeal swab, presence of viral mutation(s) within the areas targeted by this assay, and inadequate number of viral copies (<131 copies/mL). A negative result must be combined with clinical observations, patient history, and epidemiological information. The expected result is Negative. Fact Sheet for Patients:  PinkCheek.be Fact Sheet  for Healthcare Providers:  GravelBags.it This test is not yet ap proved or cleared by the [place] FDA and  has been authorized for detection and/or diagnosis of SARS-CoV-2 by FDA under an Emergency Use Authorization (EUA). This EUA will remain  in effect (meaning this test can be used) for the duration of the COVID-19 declaration under Section 564(b)(1) of the Act, 21 U.S.C. section 360bbb-3(b)(1), unless the authorization is terminated or revoked sooner.    Influenza A by PCR NEGATIVE NEGATIVE Final   Influenza B by PCR NEGATIVE NEGATIVE Final    Comment: (NOTE) The Xpert Xpress SARS-CoV-2/FLU/RSV assay is intended as an aid in  the diagnosis of influenza from Nasopharyngeal swab specimens and  should not be used as a sole basis for treatment. Nasal washings and  aspirates are unacceptable for Xpert Xpress SARS-CoV-2/FLU/RSV  testing. Fact Sheet for Patients: PinkCheek.be Fact Sheet for Healthcare Providers: GravelBags.it This test is not yet approved or cleared by the [place] FDA and  has been authorized for detection and/or diagnosis of SARS-CoV-2 by  FDA under an Emergency Use Authorization (EUA). This EUA will remain  in effect (meaning this test can be used) for the duration of the  Covid-19 declaration under Section 564(b)(1) of the Act, 21  U.S.C. section 360bbb-3(b)(1), unless the authorization is  terminated or revoked. Performed at [facility], [place] [address], [place], [place] [address]      Studies: No results found.    Flora Lipps, MD  [facility] 06/11/2019

## 2019-06-11 NOTE — Progress Notes (Signed)
Physical Therapy Treatment Patient Details Name: Gabrielle White MRN: [id number] DOB: Jun 19, 1945 Today's Date: 06/11/2019    History of Present Illness 74 yo female admitted with COPD exac. Hx of COPD, anxiety, depression, OA    PT Comments    Mod encouragement required on today. Pt c/o feeling tired and generally not well. Pt appears more dyspneic at rest today than she did on yesterday. She stated she doesn't feel like she's getting better. Encouraged pt to discuss concerns with MD when he/she rounds daily. Will continue to follow.     Follow Up Recommendations  Home health PT     Equipment Recommendations  (may need a RW vs rollator)    Recommendations for Other Services       Precautions / Restrictions Precautions Precautions: Fall Precaution Comments: monitor O2; hx of falls Restrictions Weight Bearing Restrictions: No    Mobility  Bed Mobility Overal bed mobility: Modified Independent             General bed mobility comments: seated rest break needed once EOB. cues for pursed lip/deep breathing  Transfers Overall transfer level: Needs assistance   Transfers: Sit to/from Stand Sit to Stand: Min guard         General transfer comment: unsteady. close guard for safety  Ambulation/Gait Ambulation/Gait assistance: Min assist Gait Distance (Feet): 20 Feet Assistive device: None       General Gait Details: in room only on today. unsteady. intermittent assist to steady. O2 91% on 3L   Stairs             Wheelchair Mobility    Modified Rankin (Stroke Patients Only)       Balance Overall balance assessment: Needs assistance           Standing balance-Leahy Scale: Fair                              Cognition Arousal/Alertness: Awake/alert Behavior During Therapy: WFL for tasks assessed/performed Overall Cognitive Status: Within Functional Limits for tasks assessed                                        Exercises      General Comments        Pertinent Vitals/Pain Pain Assessment: No/denies pain    Home Living                      Prior Function            PT Goals (current goals can now be found in the care plan section) Progress towards PT goals: Progressing toward goals    Frequency    Min 3X/week      PT Plan Discharge plan needs to be updated    Co-evaluation              AM-PAC PT "6 Clicks" Mobility   Outcome Measure  Help needed turning from your back to your side while in a flat bed without using bedrails?: None Help needed moving from lying on your back to sitting on the side of a flat bed without using bedrails?: None Help needed moving to and from a bed to a chair (including a wheelchair)?: A Little Help needed standing up from a chair using your arms (e.g., wheelchair or bedside chair)?: A Little Help needed to  walk in hospital room?: A Little Help needed climbing 3-5 steps with a railing? : A Little 6 Click Score: 20    End of Session Equipment Utilized During Treatment: Oxygen Activity Tolerance: Patient limited by fatigue Patient left: in bed;with call bell/phone within reach   PT Visit Diagnosis: History of falling (Z91.81);Difficulty in walking, not elsewhere classified (R26.2);Unsteadiness on feet (R26.81)     Time: [id number] PT Time Calculation (min) (ACUTE ONLY): 12 min  Charges:  $Gait Training: 8-22 mins                         Doreatha Massed, PT Acute Rehabilitation

## 2019-06-11 NOTE — Progress Notes (Signed)
PT Cancellation Note  Patient Details Name: Gabrielle White MRN: [id number] DOB: 12/13/1945   Cancelled Treatment:    Reason Eval/Treat Not Completed: Attempted PT tx session-pt declined participation this a.m 2* difficulty breathing. Will check back later today.    Doreatha Massed, PT Acute Rehabilitation

## 2019-06-12 ENCOUNTER — Inpatient Hospital Stay (HOSPITAL_COMMUNITY): Payer: Medicare HMO

## 2019-06-12 LAB — BASIC METABOLIC PANEL
Anion gap: 10 (ref 5–15)
BUN: 19 mg/dL (ref 8–23)
CO2: 28 mmol/L (ref 22–32)
Calcium: 8.8 mg/dL — ABNORMAL LOW (ref 8.9–10.3)
Chloride: 104 mmol/L (ref 98–111)
Creatinine, Ser: 0.64 mg/dL (ref 0.44–1.00)
GFR calc Af Amer: >60 mL/min (ref >60)
GFR calc non Af Amer: >60 mL/min (ref >60)
Glucose, Bld: 100 mg/dL — ABNORMAL HIGH (ref 70–99)
Potassium: 4.1 mmol/L (ref 3.5–5.1)
Sodium: 142 mmol/L (ref 135–145)

## 2019-06-12 LAB — CBC
HCT: 48.8 % — ABNORMAL HIGH (ref 36.0–46.0)
Hemoglobin: 14.9 g/dL (ref 12.0–15.0)
MCH: 30.6 pg (ref 26.0–34.0)
MCHC: 30.5 g/dL (ref 30.0–36.0)
MCV: 100.2 fL — ABNORMAL HIGH (ref 80.0–100.0)
Platelets: 317 10*3/uL (ref 150–400)
RBC: 4.87 MIL/uL (ref 3.87–5.11)
RDW: 13.5 % (ref 11.5–15.5)
WBC: 12.8 10*3/uL — ABNORMAL HIGH (ref 4.0–10.5)
nRBC: 0 % (ref 0.0–0.2)

## 2019-06-12 LAB — MAGNESIUM: Magnesium: 2.2 mg/dL (ref 1.7–2.4)

## 2019-06-12 MED ORDER — NICOTINE 14 MG/24HR TD PT24
14.0000 mg | MEDICATED_PATCH | Freq: Every day | TRANSDERMAL | Status: DC
  Administered 2019-06-12 – 2019-06-15 (×4): 14 mg via TRANSDERMAL
  Filled 2019-06-12 (×4): qty 1

## 2019-06-12 MED ORDER — FUROSEMIDE 10 MG/ML IJ SOLN
40.0000 mg | Freq: Once | INTRAMUSCULAR | Status: AC
  Administered 2019-06-12: 40 mg via INTRAVENOUS
  Filled 2019-06-12: qty 4

## 2019-06-12 MED ORDER — BUDESONIDE 0.25 MG/2ML IN SUSP
0.2500 mg | Freq: Two times a day (BID) | RESPIRATORY_TRACT | Status: DC
  Administered 2019-06-12 – 2019-06-15 (×7): 0.25 mg via RESPIRATORY_TRACT
  Filled 2019-06-12 (×8): qty 2

## 2019-06-12 MED ORDER — METHYLPREDNISOLONE SODIUM SUCC 40 MG IJ SOLR
40.0000 mg | Freq: Four times a day (QID) | INTRAMUSCULAR | Status: DC
  Administered 2019-06-12 – 2019-06-15 (×13): 40 mg via INTRAVENOUS
  Filled 2019-06-12 (×13): qty 1

## 2019-06-12 NOTE — Care Management Important Message (Signed)
Important Message  Patient Details IM Letter given to Evette Cristal SW Case Manager to present to the Patient Name: Gabrielle White MRN: [id number] Date of Birth: [birth year]   Medicare Important Message Given:  Yes     Kerin Salen 06/12/2019, 12:10 PM

## 2019-06-12 NOTE — Progress Notes (Addendum)
PROGRESS NOTE  Gabrielle White [id number] DOB: Mar 22, 1946 DOA: 06/09/2019 PCP: Lauree Chandler, NP   LOS: 2 days   Brief narrative: As per HPI,  Patient is 74 year old female with history of COPD/emphysema, anxiety, depression, insomnia, hypertension, hyperlipidemia, tobacco abuse who presented to the ED from her pulmonologist office (Dr. Melvyn Novas) due to progressively worsening dyspnea for 4 to 5 days with associated wheezing without responding to albuterol at home.  Patient also had increased sputum production. ED Course:Initial vital signs temperature 98.9 F, pulse 92, respirations 22, blood pressure 132/81 mmHg and O2 sat 91% on room air. The patient was given bronchodilator and 60 mg of prednisone. She became hypoxic and tachypneic with ambulation, so our service was asked to evaluate for further treatment. CBC shows white count 11.3, hemoglobin 15.6 g/dL and platelets 322. PT 12.3 and INR 0.9. Venous blood gas showed a decreased PO2 of 48.30 mmHg and bicarbonate of 29.7 mmol/L. CMP showed a glucose of 121 mg/dL, but was otherwise normal. Lactic acid was 1.1 mmol/L. SARS coronavirus 2, influenza A and B by PCR was negative. Her chest radiograph did not have any acute cardiopulmonary disease, but showed hyperinflation compatible with emphysema.   Assessment/Plan:  Principal Problem:   Acute respiratory failure with hypoxemia (HCC) Active Problems:   Cigarette smoker   Hyperlipidemia   Hypothyroidism   Hyperglycemia   Essential hypertension   Depression   Polycythemia   COPD exacerbation (HCC)  Acute hypoxic respiratory failure secondary to COPD exacerbation Patient requiring of 5 L of oxygen today with diffuse wheezing.  Chest x-ray done stat that did not show any acute findings.  Empirically give 1 dose of IV Lasix.  Patient is on Breztri at home.  Continue with duo nebs, azithromycin incentive spirometry and flutter valve.  Continue Protonix.  Respiratory viral panel was  negative.  Might need oxygen on discharge.  We will change p.o. prednisone to IV Solu-Medrol and add inhaled steroids today.  Essential hypertension on losartan.  Will closely monitor blood pressure  Polycythemia secondary to COPD/tobacco use.  Patient was encouraged against tobacco smoking.  Hyperlipidemia on atorvastatin  Hypothyroidism on levothyroxine  Depression on sertraline  GERD Continue Protonix,   VTE Prophylaxis: Lovenox subcu  Code Status: Full code  Family Communication: I spoke with the patient's son Mr. Shade Flood on the mobile phone and updated him about the clinical condition of the patient.  Disposition Plan:  . Patient is from home . Likely disposition to home with home health likely in 1 to 2 days.  Seen by physical therapy recommend home health on discharge. . Barriers to discharge: Increased oxygen demand today with increased wheezing, increased respiratory distress.  Will escalate steroids and inhalers, will assess for oxygen requirement prior to discharge.    Consultants:  None  Procedures:  None  Antibiotics:  . Azithromycin  Anti-infectives (From admission, onward)   Start     Dose/Rate Route Frequency Ordered Stop   06/11/19 1000  azithromycin (ZITHROMAX) tablet 250 mg     250 mg Oral Daily 06/10/19 1642 06/15/19 0959   06/10/19 1645  azithromycin (ZITHROMAX) tablet 500 mg     500 mg Oral Daily 06/10/19 1642 06/10/19 1723      Subjective: Today, patient was seen and examined at bedside.  Patient complains of increasing shortness of breath dyspnea, wheezing today.  Nursing staff reported increasing oxygen requirement.  Objective: Vitals:   06/12/19 0817 06/12/19 1107  BP:    Pulse:  Resp:    Temp:    SpO2: 92% 93%   No intake or output data in the 24 hours ending 06/12/19 1112 Filed Weights   06/09/19 1041  Weight: 71.2 kg   Body mass index is 26.13 kg/m.   Physical Exam:  GENERAL: Patient is alert awake and oriented.   Mildly anxious and with nasal cannula oxygen at 5 L/min. HENT: No scleral pallor or icterus. Pupils equally reactive to light. Oral mucosa is moist NECK: is supple, no gross swelling noted. CHEST:   Diminished breath sounds bilaterally.  Diffuse wheezing and crackles. CVS: S1 and S2 heard, no murmur. Regular rate and rhythm.  ABDOMEN: Soft, non-tender, bowel sounds are present. EXTREMITIES: No edema. CNS: Cranial nerves are intact. No focal motor deficits. SKIN: warm and dry without rashes.  Data Review: I have personally reviewed the following laboratory data and studies,  CBC: Recent Labs  Lab 06/09/19 1105 06/10/19 0805 06/11/19 0540 06/12/19 0444  WBC 11.3* 11.6* 14.3* 12.8*  NEUTROABS 7.6  --   --   --   HGB 15.6* 14.5 14.0 14.9  HCT 49.8* 45.3 43.5 48.8*  MCV 97.5 96.6 98.0 100.2*  PLT 322 316 286 [id number]   Basic Metabolic Panel: Recent Labs  Lab 06/09/19 1105 06/10/19 0805 06/11/19 0540 06/12/19 0444  NA 140 140 139 142  K 4.0 4.0 3.6 4.1  CL 103 105 106 104  CO2 30 26 26 28   GLUCOSE 121* 133* 114* 100*  BUN 15 16 26* 19  CREATININE 0.76 0.66 0.73 0.64  CALCIUM 9.4 9.2 8.5* 8.8*  MG 2.1  --   --  2.2  PHOS 4.0  --   --   --    Liver Function Tests: Recent Labs  Lab 06/09/19 1105  AST 17  ALT 21  ALKPHOS 118  BILITOT 0.8  PROT 7.5  ALBUMIN 4.2   No results for input(s): LIPASE, AMYLASE in the last 168 hours. No results for input(s): AMMONIA in the last 168 hours. Cardiac Enzymes: No results for input(s): CKTOTAL, CKMB, CKMBINDEX, TROPONINI in the last 168 hours. BNP (last 3 results) No results for input(s): BNP in the last 8760 hours.  ProBNP (last 3 results) No results for input(s): PROBNP in the last 8760 hours.  CBG: No results for input(s): GLUCAP in the last 168 hours. Recent Results (from the past 240 hour(s))  Culture, blood (Routine x 2)     Status: None (Preliminary result)   Collection Time: 06/09/19 11:05 AM   Specimen: BLOOD  Result  Value Ref Range Status   Specimen Description   Final    BLOOD LEFT ANTECUBITAL Performed at [facility] [address] [place], [place] [address]    Special Requests   Final    BOTTLES DRAWN AEROBIC AND ANAEROBIC Blood Culture adequate volume Performed at [facility] [address], [place], [place] [address]    Culture   Final    NO GROWTH 3 DAYS Performed at [facility], [place] [address], [place], [place] [address]    Report Status PENDING  Incomplete  Culture, blood (Routine x 2)     Status: None (Preliminary result)   Collection Time: 06/09/19 11:13 AM   Specimen: BLOOD  Result Value Ref Range Status   Specimen Description   Final    BLOOD RIGHT ANTECUBITAL Performed at [facility] [address], [place],  [address]    Special Requests   Final  BOTTLES DRAWN AEROBIC AND ANAEROBIC Blood Culture adequate volume Performed at [facility] [address] [place], [place] [address]    Culture   Final    NO GROWTH 3 DAYS Performed at [facility], [place] [address], [place], [place] [address]    Report Status PENDING  Incomplete  Respiratory Panel by RT PCR (Flu A&B, Covid) - Nasopharyngeal Swab     Status: None   Collection Time: 06/09/19 12:15 PM   Specimen: Nasopharyngeal Swab  Result Value Ref Range Status   SARS Coronavirus 2 by RT PCR NEGATIVE NEGATIVE Final    Comment: (NOTE) SARS-CoV-2 target nucleic acids are NOT DETECTED. The SARS-CoV-2 RNA is generally detectable in upper respiratoy specimens during the acute phase of infection. The lowest concentration of SARS-CoV-2 viral copies this assay can detect is 131 copies/mL. A negative result does not preclude SARS-Cov-2 infection and should not be used as the sole basis for treatment or other patient management decisions. A negative result may occur with  improper specimen collection/handling, submission of specimen  other than nasopharyngeal swab, presence of viral mutation(s) within the areas targeted by this assay, and inadequate number of viral copies (<131 copies/mL). A negative result must be combined with clinical observations, patient history, and epidemiological information. The expected result is Negative. Fact Sheet for Patients:  PinkCheek.be Fact Sheet for Healthcare Providers:  GravelBags.it This test is not yet ap proved or cleared by the [place] FDA and  has been authorized for detection and/or diagnosis of SARS-CoV-2 by FDA under an Emergency Use Authorization (EUA). This EUA will remain  in effect (meaning this test can be used) for the duration of the COVID-19 declaration under Section 564(b)(1) of the Act, 21 U.S.C. section 360bbb-3(b)(1), unless the authorization is terminated or revoked sooner.    Influenza A by PCR NEGATIVE NEGATIVE Final   Influenza B by PCR NEGATIVE NEGATIVE Final    Comment: (NOTE) The Xpert Xpress SARS-CoV-2/FLU/RSV assay is intended as an aid in  the diagnosis of influenza from Nasopharyngeal swab specimens and  should not be used as a sole basis for treatment. Nasal washings and  aspirates are unacceptable for Xpert Xpress SARS-CoV-2/FLU/RSV  testing. Fact Sheet for Patients: PinkCheek.be Fact Sheet for Healthcare Providers: GravelBags.it This test is not yet approved or cleared by the [place] FDA and  has been authorized for detection and/or diagnosis of SARS-CoV-2 by  FDA under an Emergency Use Authorization (EUA). This EUA will remain  in effect (meaning this test can be used) for the duration of the  Covid-19 declaration under Section 564(b)(1) of the Act, 21  U.S.C. section 360bbb-3(b)(1), unless the authorization is  terminated or revoked. Performed at [facility], [place] [address], [place], [place] [address]      Studies: DG CHEST PORT 1 VIEW  Result Date: 06/12/2019 CLINICAL DATA:  74 year old female with history of progressively worsening dyspnea for the past 4-5 days. EXAM: PORTABLE CHEST 1 VIEW COMPARISON:  Chest x-ray 06/09/2019. FINDINGS: Lung volumes are normal. No consolidative airspace disease. No pleural effusions. No pneumothorax. No pulmonary nodule or mass noted. Pulmonary vasculature and the cardiomediastinal silhouette are within normal limits. Atherosclerosis in the thoracic aorta. IMPRESSION: 1.  No radiographic evidence of acute cardiopulmonary disease. 2. Aortic atherosclerosis. Electronically Signed   By: Vinnie Langton M.D.   On: 06/12/2019 10:56      Flora Lipps, MD  [facility] 06/12/2019

## 2019-06-12 NOTE — Progress Notes (Signed)
Physical Therapy Treatment Patient Details Name: Gabrielle White MRN: [id number] DOB: [birth year] Today's Date: 06/12/2019    History of Present Illness 74 yo female admitted with COPD exac. Hx of COPD, anxiety, depression, OA    PT Comments    Patient resting in bed when PT arrived. She was very sleepy, and indicated she had received medication that was causing the drowsiness. Had just had chest x-rays performed 20 minutes prior to PT arriving. She was alert enough to practice ex for LE, UE and Flutter valve unit- see details. Printed ex sheet issued and she was reminded to use Flutter Valve unit hourly 8-10 breaths- good return demo today. LE/UE ex are to be done at least 2 x daily, 10 reps per set. She declined any OOB activity, in part due to being sleepy- but she says she is incontinent and "was wet including the bed". RN aware and notified tech. Patient is also  receiving lasix and is to have BS commode next to bed - Should continue to benefit from PT to further address optimal functional outcomes.  Follow Up Recommendations  Home health PT;Supervision/Assistance - 24 hour     Equipment Recommendations       Recommendations for Other Services       Precautions / Restrictions Precautions Precautions: Fall Precaution Comments: monitor O2; hx of falls Restrictions Weight Bearing Restrictions: No    Mobility  Bed Mobility Overal bed mobility: Modified Independent(Did not want to perform any bed mobility or OOB activity- sleepy from medication and needed to be cleaned up (including all linens) RN aware, and notified tech.)                Transfers                    Ambulation/Gait                 Stairs             Wheelchair Mobility    Modified Rankin (Stroke Patients Only)       Balance Overall balance assessment: Needs assistance(She was very sleepy from medication and reported that she was incontinent and needed to be changed  (including bed linens). RN aware and notified [facility])                                          Cognition Arousal/Alertness: Awake/alert Behavior During Therapy: WFL for tasks assessed/performed Overall Cognitive Status: Within Functional Limits for tasks assessed                                 General Comments: She is very pleasant and cooperative. After she was instructed in ex format (she was sleepy from medication), she indicated that she is incontinent and that her bed was wet. RN was in room and notified the tech.      Exercises General Exercises - Lower Extremity Ankle Circles/Pumps: AROM;Supine;10 reps Quad Sets: AROM;Supine;10 reps Gluteal Sets: AROM;Supine;10 reps Short Arc Quad: AROM;10 reps;Supine Heel Slides: AROM;Supine;10 reps Straight Leg Raises: AROM;Supine;10 reps Other Exercises Other Exercises: Practiced Flutter valve unit with good return demo- reminded to do hourly 8-10 breaths. Other Exercises: Issued printed ex sheet with pictures and commentary- reminded to do these at least 2 x daily (10 reps each set)  General Comments General comments (skin integrity, edema, etc.): Monitor closely for skin and joint integrity. She is receiving lasix      Pertinent Vitals/Pain Pain Assessment: No/denies pain    Home Living                      Prior Function            PT Goals (current goals can now be found in the care plan section) Acute Rehab PT Goals Patient Stated Goal: to improve balance and avoid falls PT Goal Formulation: With patient Time For Goal Achievement: 06/24/19 Potential to Achieve Goals: Good Progress towards PT goals: Progressing toward goals    Frequency    Min 3X/week      PT Plan      Co-evaluation              AM-PAC PT "6 Clicks" Mobility   Outcome Measure  Help needed turning from your back to your side while in a flat bed without using bedrails?: None Help needed moving  from lying on your back to sitting on the side of a flat bed without using bedrails?: None Help needed moving to and from a bed to a chair (including a wheelchair)?: A Little Help needed standing up from a chair using your arms (e.g., wheelchair or bedside chair)?: A Little Help needed to walk in hospital room?: A Lot Help needed climbing 3-5 steps with a railing? : A Lot 6 Click Score: 18    End of Session Equipment Utilized During Treatment: Oxygen Activity Tolerance: Patient limited by fatigue;Other (comment)(states she had received medication that was making her sleepy) Patient left: in bed;with call bell/phone within reach   PT Visit Diagnosis: History of falling (Z91.81);Difficulty in walking, not elsewhere classified (R26.2);Unsteadiness on feet (R26.81)     Time: [id number] PT Time Calculation (min) (ACUTE ONLY): 29 min  Charges:  $Therapeutic Exercise: 23-37 mins                    Rollen Sox, PT # [phone] CGV cell  Casandra Doffing 06/12/2019, 11:17 AM

## 2019-06-13 ENCOUNTER — Encounter (HOSPITAL_COMMUNITY): Payer: Self-pay | Admitting: Internal Medicine

## 2019-06-13 DIAGNOSIS — J9601 Acute respiratory failure with hypoxia: Secondary | ICD-10-CM

## 2019-06-13 LAB — BLOOD GAS, ARTERIAL
Acid-Base Excess: 6.6 mmol/L — ABNORMAL HIGH (ref 0.0–2.0)
Bicarbonate: 32.4 mmol/L — ABNORMAL HIGH (ref 20.0–28.0)
FIO2: 40
O2 Saturation: 90.2 %
Patient temperature: 97.9
pCO2 arterial: 51.4 mmHg — ABNORMAL HIGH (ref 32.0–48.0)
pH, Arterial: 7.414 (ref 7.350–7.450)
pO2, Arterial: 54.5 mmHg — ABNORMAL LOW (ref 83.0–108.0)

## 2019-06-13 NOTE — Progress Notes (Addendum)
PROGRESS NOTE  Gabrielle White [id number] DOB: Aug 30, 1945 DOA: 06/09/2019 PCP: Lauree Chandler, NP   LOS: 3 days   Brief narrative: As per HPI,  Patient is 74 year old female with history of COPD/emphysema, anxiety, depression, insomnia, hypertension, hyperlipidemia, tobacco abuse who presented to the ED from her pulmonologist office (Dr. Melvyn Novas) due to progressively worsening dyspnea for 4 to 5 days with associated wheezing without responding to albuterol at home.  Patient also had increased sputum production. ED Course:Initial vital signs temperature 98.9 F, pulse 92, respirations 22, blood pressure 132/81 mmHg and O2 sat 91% on room air. The patient was given bronchodilator and 60 mg of prednisone. She became hypoxic and tachypneic with ambulation, so our service was asked to evaluate for further treatment. CBC shows white count 11.3, hemoglobin 15.6 g/dL and platelets 322. PT 12.3 and INR 0.9. Venous blood gas showed a decreased PO2 of 48.30 mmHg and bicarbonate of 29.7 mmol/L. CMP showed a glucose of 121 mg/dL, but was otherwise normal. Lactic acid was 1.1 mmol/L. SARS coronavirus 2, influenza A and B by PCR was negative. Her chest radiograph did not have any acute cardiopulmonary disease, but showed hyperinflation compatible with emphysema.   Assessment/Plan:  Principal Problem:   Acute respiratory failure with hypoxemia (HCC) Active Problems:   Cigarette smoker   Hyperlipidemia   Hypothyroidism   Hyperglycemia   Essential hypertension   Depression   Polycythemia   COPD exacerbation (HCC)  Acute hypoxic respiratory failure secondary to COPD exacerbation Patient is constantly coughing wheezing with dyspnea and is short of breath.  Still requiring 5 L of oxygen.  Chest x-ray was done yesterday which did not show any infiltrate.  Received 1 dose of IV Lasix yesterday.    Continue with duo nebs, azithromycin, incentive spirometry.  On IV Solu-Medrol.  Escalated steroids and  bronchodilators since yesterday. Despite that the patient has persistent hypoxia respiratory difficulty and persistent cough. Continue Protonix.  Respiratory viral panel was negative.  Patient has not improved despite the recommended on the treatment.  Will get pulmonary opinion.  We will get ABG.  Essential hypertension on losartan.  Continue.  Closely monitor  Polycythemia secondary to COPD/tobacco use.  Continue nicotine patch.  Hyperlipidemia on atorvastatin  Hypothyroidism on levothyroxine  Depression on sertraline  GERD Continue Protonix,   VTE Prophylaxis: Lovenox subcu  Code Status: Full code  Family Communication: None today.  Spoke with the patient's son yesterday.   Disposition Plan:  . Patient is from home . Likely disposition to home with home health likely in 1 to 2 days.  Seen by physical therapy recommend home health on discharge.Barriers to discharge: Persistent respiratory distress on 5 L of oxygen, diffuse wheezing.  Will consult pulmonary, spoke with Dr. Ander Slade  Consultants:  Pulmonary  Procedures:  None  Antibiotics:  . Azithromycin  Anti-infectives (From admission, onward)   Start     Dose/Rate Route Frequency Ordered Stop   06/11/19 1000  azithromycin (ZITHROMAX) tablet 250 mg     250 mg Oral Daily 06/10/19 1642 06/15/19 0959   06/10/19 1645  azithromycin (ZITHROMAX) tablet 500 mg     500 mg Oral Daily 06/10/19 1642 06/10/19 1723      Subjective:  Today, patient was seen and examined at bedside.  Patient states that she has been constantly coughing and feels dyspneic.  Denies chest pain.  No sputum production.    Objective: Vitals:   06/13/19 0817 06/13/19 1142  BP:    Pulse:  Resp:    Temp:    SpO2: 92% 91%    Intake/Output Summary (Last 24 hours) at 06/13/2019 1150 Last data filed at 06/13/2019 0644 Gross per 24 hour  Intake 720 ml  Output 1300 ml  Net -580 ml   Filed Weights   06/09/19 1041  Weight: 71.2 kg   Body mass  index is 26.13 kg/m.   Physical Exam:  GENERAL: Patient is alert awake and oriented.  Constantly coughing, nasal cannula oxygen at 5 L/min. HENT: No scleral pallor or icterus. Pupils equally reactive to light. Oral mucosa is moist NECK: is supple, no gross swelling noted. CHEST:   Diminished breath sounds bilaterally.  Diffuse wheezing noted bilaterally. CVS: S1 and S2 heard, no murmur. Regular rate and rhythm.  ABDOMEN: Soft, non-tender, bowel sounds are present. EXTREMITIES: No edema. CNS: Cranial nerves are intact. No focal motor deficits. SKIN: warm and dry without rashes.  Data Review: I have personally reviewed the following laboratory data and studies,  CBC: Recent Labs  Lab 06/09/19 1105 06/10/19 0805 06/11/19 0540 06/12/19 0444  WBC 11.3* 11.6* 14.3* 12.8*  NEUTROABS 7.6  --   --   --   HGB 15.6* 14.5 14.0 14.9  HCT 49.8* 45.3 43.5 48.8*  MCV 97.5 96.6 98.0 100.2*  PLT 322 316 286 [id number]   Basic Metabolic Panel: Recent Labs  Lab 06/09/19 1105 06/10/19 0805 06/11/19 0540 06/12/19 0444  NA 140 140 139 142  K 4.0 4.0 3.6 4.1  CL 103 105 106 104  CO2 30 26 26 28   GLUCOSE 121* 133* 114* 100*  BUN 15 16 26* 19  CREATININE 0.76 0.66 0.73 0.64  CALCIUM 9.4 9.2 8.5* 8.8*  MG 2.1  --   --  2.2  PHOS 4.0  --   --   --    Liver Function Tests: Recent Labs  Lab 06/09/19 1105  AST 17  ALT 21  ALKPHOS 118  BILITOT 0.8  PROT 7.5  ALBUMIN 4.2   No results for input(s): LIPASE, AMYLASE in the last 168 hours. No results for input(s): AMMONIA in the last 168 hours. Cardiac Enzymes: No results for input(s): CKTOTAL, CKMB, CKMBINDEX, TROPONINI in the last 168 hours. BNP (last 3 results) No results for input(s): BNP in the last 8760 hours.  ProBNP (last 3 results) No results for input(s): PROBNP in the last 8760 hours.  CBG: No results for input(s): GLUCAP in the last 168 hours. Recent Results (from the past 240 hour(s))  Culture, blood (Routine x 2)      Status: None (Preliminary result)   Collection Time: 06/09/19 11:05 AM   Specimen: BLOOD  Result Value Ref Range Status   Specimen Description   Final    BLOOD LEFT ANTECUBITAL Performed at [place] [address] [place], [place] [address]    Special Requests   Final    BOTTLES DRAWN AEROBIC AND ANAEROBIC Blood Culture adequate volume Performed at [facility] [address] [place], [place] [address]    Culture   Final    NO GROWTH 4 DAYS Performed at [facility], [place] [address] [place], [place] [address]    Report Status PENDING  Incomplete  Culture, blood (Routine x 2)     Status: None (Preliminary result)   Collection Time: 06/09/19 11:13 AM   Specimen: BLOOD  Result Value Ref Range Status   Specimen Description   Final    BLOOD RIGHT ANTECUBITAL Performed at  [facility], [place] [address], [place], [place] [address]    Special Requests   Final    BOTTLES DRAWN AEROBIC AND ANAEROBIC Blood Culture adequate volume Performed at [facility] [address] [place], [place] [address]    Culture   Final    NO GROWTH 4 DAYS Performed at [facility], [place] [address], [place], [place] [address]    Report Status PENDING  Incomplete  Respiratory Panel by RT PCR (Flu A&B, Covid) - Nasopharyngeal Swab     Status: None   Collection Time: 06/09/19 12:15 PM   Specimen: Nasopharyngeal Swab  Result Value Ref Range Status   SARS Coronavirus 2 by RT PCR NEGATIVE NEGATIVE Final    Comment: (NOTE) SARS-CoV-2 target nucleic acids are NOT DETECTED. The SARS-CoV-2 RNA is generally detectable in upper respiratoy specimens during the acute phase of infection. The lowest concentration of SARS-CoV-2 viral copies this assay can detect is 131 copies/mL. A negative result does not preclude SARS-Cov-2 infection and should not be used as the sole basis for treatment or other patient management  decisions. A negative result may occur with  improper specimen collection/handling, submission of specimen other than nasopharyngeal swab, presence of viral mutation(s) within the areas targeted by this assay, and inadequate number of viral copies (<131 copies/mL). A negative result must be combined with clinical observations, patient history, and epidemiological information. The expected result is Negative. Fact Sheet for Patients:  PinkCheek.be Fact Sheet for Healthcare Providers:  GravelBags.it This test is not yet ap proved or cleared by the [place] FDA and  has been authorized for detection and/or diagnosis of SARS-CoV-2 by FDA under an Emergency Use Authorization (EUA). This EUA will remain  in effect (meaning this test can be used) for the duration of the COVID-19 declaration under Section 564(b)(1) of the Act, 21 U.S.C. section 360bbb-3(b)(1), unless the authorization is terminated or revoked sooner.    Influenza A by PCR NEGATIVE NEGATIVE Final   Influenza B by PCR NEGATIVE NEGATIVE Final    Comment: (NOTE) The Xpert Xpress SARS-CoV-2/FLU/RSV assay is intended as an aid in  the diagnosis of influenza from Nasopharyngeal swab specimens and  should not be used as a sole basis for treatment. Nasal washings and  aspirates are unacceptable for Xpert Xpress SARS-CoV-2/FLU/RSV  testing. Fact Sheet for Patients: PinkCheek.be Fact Sheet for Healthcare Providers: GravelBags.it This test is not yet approved or cleared by the [place] FDA and  has been authorized for detection and/or diagnosis of SARS-CoV-2 by  FDA under an Emergency Use Authorization (EUA). This EUA will remain  in effect (meaning this test can be used) for the duration of the  Covid-19 declaration under Section 564(b)(1) of the Act, 21  U.S.C. section 360bbb-3(b)(1), unless the authorization  is  terminated or revoked. Performed at [facility], [place] [address] [place], [place] [address]      Studies: DG CHEST PORT 1 VIEW  Result Date: 06/12/2019 CLINICAL DATA:  74 year old female with history of progressively worsening dyspnea for the past 4-5 days. EXAM: PORTABLE CHEST 1 VIEW COMPARISON:  Chest x-ray 06/09/2019. FINDINGS: Lung volumes are normal. No consolidative airspace disease. No pleural effusions. No pneumothorax. No pulmonary nodule or mass noted. Pulmonary vasculature and the cardiomediastinal silhouette are within normal limits. Atherosclerosis in the thoracic aorta. IMPRESSION: 1.  No radiographic evidence of acute cardiopulmonary disease. 2. Aortic atherosclerosis. Electronically Signed   By: Vinnie Langton M.D.   On:  06/12/2019 10:56      Flora Lipps, MD  [facility] 06/13/2019

## 2019-06-13 NOTE — Progress Notes (Signed)
Notified Lab that ABG being sent for analysis. 

## 2019-06-13 NOTE — Consult Note (Signed)
NAME:  Gabrielle White, MRN:  [id number], DOB:  [birth year], LOS: 3 ADMISSION DATE:  06/09/2019, CONSULTATION DATE:  06/13/2019 REFERRING MD: Dr. Louanne Belton, CHIEF COMPLAINT: COPD exacerbation  Brief History   Elderly lady with a history of COPD came in with worsening shortness of breath of a few weeks duration Follows up with Dr. Melvyn Novas as an outpatient She claims she quit smoking about a week prior to coming into the hospital Persistent shortness of breath, wheezing, coughing despite ongoing treatment  Past Medical History   Past Medical History:  Diagnosis Date  . Chronic airway obstruction, not elsewhere classified   . Depressive disorder   . Depressive disorder, not elsewhere classified   . Emphysema   . Enthesopathy of ankle and tarsus, unspecified   . HTN (hypertension)   . Hyperlipidemia   . Hypertension   . Hypopotassemia   . Leukocytosis   . Migraine, unspecified, without mention of intractable migraine without mention of status migrainosus   . Nonspecific (abnormal) findings on radiological and other examination of skull and head   . Osteoarthrosis, unspecified whether generalized or localized, unspecified site   . Other emphysema ([place])   . Other specified disease of white blood cells   . Palpitations   . RMSF [facility] spotted fever) 08/23/2015   Positive IgM. Treated with doxycycline.   . Tobacco abuse      Significant Hospital Events   Persistent wheezing  Consults:  PCCM  Procedures:  None  Significant Diagnostic Tests:  Chest x-ray on 3/11-reviewed by myself showing no acute infiltrate, evidence of emphysema  Micro Data:  Blood culture 3/8-no growth  Antimicrobials:  Azithromycin 3/9>>  Interim history/subjective:  Persistent wheezing, shortness of breath, coughing  Objective   Blood pressure 118/64, pulse 100, temperature 98 F (36.7 C), temperature source Oral, resp. rate 18, height 5\' 5"  (1.651 m), weight 71.2 kg, SpO2 97 %.         Intake/Output Summary (Last 24 hours) at 06/13/2019 1655 Last data filed at 06/13/2019 0644 Gross per 24 hour  Intake 480 ml  Output 550 ml  Net -70 ml   Filed Weights   06/09/19 1041  Weight: 71.2 kg    Examination: General: Elderly lady, does not appear to be in distress at rest, starts coughing with any activity HENT: Moist oral mucosa Lungs: Decreased air movement bilaterally, bilateral rhonchi Cardiovascular: S1-S2 appreciated Abdomen: Bowel sounds appreciated Extremities: No clubbing, no edema Neuro: Alert and oriented x3 GU:   Resolved Hospital Problem list     Assessment & Plan:  Gold stage II COPD Acute COPD exacerbation -Continue bronchodilators -Continue steroids-she is on nebulized steroids and IV steroids -Complete course of antibiotics  Recent smoker -She stated that she quit smoking about a week prior to coming into the hospital -Counseled about smoking and risk of progressive disease  Acute hypoxemic respiratory failure -She is requiring oxygen supplementation at present -Continue oxygen supplementation, -Goal saturations greater than 88%  Other comorbidities include depression, hypertension, hypothyroidism  Polycythemia likely related to advanced chronic obstructive pulmonary disease  Patient has been optimally treated at the present time It is taking time for her to stabilize Underlying severe lung disease has a bearing, possibly a viral exacerbation of symptoms Not really coughing up any significant secretions to suggest mucous plugging  She likely has very severe underlying disease as PFTs over the last few years do show progressive decline in the FEV1 from 1.74 to 1.31 to1.19 in 2019, this is likely lower  at present as she was still smoking actively up until recently  Will suggest to continue lines of care   Labs   CBC: Recent Labs  Lab 06/09/19 1105 06/10/19 0805 06/11/19 0540 06/12/19 0444  WBC 11.3* 11.6* 14.3* 12.8*  NEUTROABS  7.6  --   --   --   HGB 15.6* 14.5 14.0 14.9  HCT 49.8* 45.3 43.5 48.8*  MCV 97.5 96.6 98.0 100.2*  PLT 322 316 286 [id number]    Basic Metabolic Panel: Recent Labs  Lab 06/09/19 1105 06/10/19 0805 06/11/19 0540 06/12/19 0444  NA 140 140 139 142  K 4.0 4.0 3.6 4.1  CL 103 105 106 104  CO2 30 26 26 28   GLUCOSE 121* 133* 114* 100*  BUN 15 16 26* 19  CREATININE 0.76 0.66 0.73 0.64  CALCIUM 9.4 9.2 8.5* 8.8*  MG 2.1  --   --  2.2  PHOS 4.0  --   --   --    GFR: Estimated Creatinine Clearance: 61.1 mL/min (by C-G formula based on SCr of 0.64 mg/dL). Recent Labs  Lab 06/09/19 1105 06/10/19 0805 06/11/19 0540 06/12/19 0444  WBC 11.3* 11.6* 14.3* 12.8*  LATICACIDVEN 1.1  --   --   --     Liver Function Tests: Recent Labs  Lab 06/09/19 1105  AST 17  ALT 21  ALKPHOS 118  BILITOT 0.8  PROT 7.5  ALBUMIN 4.2   No results for input(s): LIPASE, AMYLASE in the last 168 hours. No results for input(s): AMMONIA in the last 168 hours.  ABG    Component Value Date/Time   PHART 7.414 06/13/2019 1229   PCO2ART 51.4 (H) 06/13/2019 1229   PO2ART 54.5 (L) 06/13/2019 1229   HCO3 32.4 (H) 06/13/2019 1229   O2SAT 90.2 06/13/2019 1229     Coagulation Profile: Recent Labs  Lab 06/09/19 1105  INR 0.9    Cardiac Enzymes: No results for input(s): CKTOTAL, CKMB, CKMBINDEX, TROPONINI in the last 168 hours.  HbA1C: Hgb A1c MFr Bld  Date/Time Value Ref Range Status  06/17/2013 03:54 PM 5.9 (H) 4.8 - 5.6 % Final    Comment:             Increased risk for diabetes: 5.7 - 6.4          Diabetes: >6.4          Glycemic control for adults with diabetes: <7.0    CBG: No results for input(s): GLUCAP in the last 168 hours.  Review of Systems:   Cough and congestion  Past Medical History  She,  has a past medical history of Chronic airway obstruction, not elsewhere classified, Depressive disorder, Depressive disorder, not elsewhere classified, Emphysema, Enthesopathy of ankle and  tarsus, unspecified, HTN (hypertension), Hyperlipidemia, Hypertension, Hypopotassemia, Leukocytosis, Migraine, unspecified, without mention of intractable migraine without mention of status migrainosus, Nonspecific (abnormal) findings on radiological and other examination of skull and head, Osteoarthrosis, unspecified whether generalized or localized, unspecified site, Other emphysema ([place]), Other specified disease of white blood cells, Palpitations, RMS[facility] spotted fever) (08/23/2015), and Tobacco abuse.   Surgical History    Past Surgical History:  Procedure Laterality Date  . CESAREAN SECTION    . COLONOSCOPY  08/14/2008   Dr.. Delfin Edis     Social History   reports that she has been smoking cigarettes. She has a 28.00 pack-year smoking history. She has never used smokeless tobacco. She reports that she does not drink alcohol or use drugs.  Family History   Her family history includes Alzheimer's disease in her father; Breast cancer in her mother; Diabetes in her father; Heart disease in her father and mother; Skin cancer in her father.   Allergies Allergies  Allergen Reactions  . Cafergot   . Codeine   . Fenoprofen Calcium   . Nalfon [Fenoprofen]     Sherrilyn Rist, MD [place], PCCM Cell: [phone]

## 2019-06-13 NOTE — Plan of Care (Signed)
  Problem: Education: Goal: Knowledge of disease or condition will improve Outcome: Progressing   Problem: Respiratory: Goal: Ability to maintain adequate ventilation will improve Outcome: Progressing

## 2019-06-13 NOTE — Progress Notes (Signed)
ABG on 5 lpm nasal cannula= 54.5 (02 Saturation 90.2). MD ordered goal >=92% Therefore, RT placed PT on high flow nasal cannula ([place]- not Heated) at 6 lpm. RN aware. RT requested that RN have PT Sp02 checked in 2-30 minutes.

## 2019-06-13 NOTE — TOC Initial Note (Signed)
Transition of Care [facility]) - Initial/Assessment Note    Patient Details  Name: Gabrielle White MRN: [id number] Date of Birth: 1946-03-28  Transition of Care [facility]) CM/SW Contact:    Ross Ludwig, LCSW Phone Number: 06/13/2019, 6:05 PM  Clinical Narrative:                  Patient is a 74 year old female who is alert and oriented x4.  Patient was sleeping, CSW was not able to complete assessment by speaking to her.  CSW reviewed patient's chart, and also spoke to patient's son to discuss going home with home health and oxygen once she is medically ready for discharge.  Patient's son stated she lives alone, and has not had home health before.  CSW explained to him what to expect and process for finding home health.  CSW informed him that [facility] was able to accept patient because they are in network with her insurance.  Patient's son asked if she will need oxygen when she discharges, CSW informed him that will depend on how she does, and if she qualifies for oxygen, case management team will set up the oxygen for her.  Patient's son expressed understanding and did not have any other questions or concerns.  CSW to continue to follow patient's progress throughout discharge planning.  Expected Discharge Plan: [place] Barriers to Discharge: Continued Medical Work up   Patient Goals and CMS Choice Patient states their goals for this hospitalization and ongoing recovery are:: To return back home with home health services CMS Medicare.gov Compare Post Acute Care list provided to:: Patient Choice offered to / list presented to : Patient  Expected Discharge Plan and Services Expected Discharge Plan: [place] In-house Referral: Clinical Social Work   Post Acute Care Choice: [place] arrangements for the past 2 months: Single Family Home                 DME Arranged: Oxygen DME Agency: [employer]                  Prior Living  Arrangements/Services Living arrangements for the past 2 months: Single Family Home Lives with:: Self              Current home services: DME    Activities of Daily Living Home Assistive Devices/Equipment: Eyeglasses ADL Screening (condition at time of admission) Patient's cognitive ability adequate to safely complete daily activities?: Yes Is the patient deaf or have difficulty hearing?: No Does the patient have difficulty seeing, even when wearing glasses/contacts?: No Does the patient have difficulty concentrating, remembering, or making decisions?: No Patient able to express need for assistance with ADLs?: Yes Does the patient have difficulty dressing or bathing?: No Independently performs ADLs?: Yes (appropriate for developmental age) Does the patient have difficulty walking or climbing stairs?: No Weakness of Legs: None Weakness of Arms/Hands: None  Permission Sought/Granted                  Emotional Assessment              Admission diagnosis:  Hypoxia [R09.02] COPD exacerbation ([place]) [J44.1] Acute respiratory failure with hypoxemia ([place]) [J96.01] Patient Active Problem List   Diagnosis Date Noted  . COPD with acute exacerbation ([place]) 06/10/2019  . Acute respiratory failure with hypoxia and hypercapnia ([place]) 06/10/2019  . COPD exacerbation ([place]) 06/10/2019  . Acute respiratory failure with hypoxemia ([place]) 06/09/2019  . Polycythemia 06/09/2019  .  Atherosclerosis of aorta ([place]) 11/12/2018  . Emphysema lung ([place]) 11/12/2018  . Rhinitis, chronic 03/12/2018  . Abnormal CXR 11/21/2016  . Ganglion cyst of wrist 05/23/2016  . DOE (dyspnea on exertion) 03/21/2016  . Absence attack ([place]) 10/06/2015  . RMSF [facility] spotted fever) 08/23/2015  . Depression 10/28/2014  . Balance problem 07/07/2014  . Claustrophobia 12/17/2013  . Urinary incontinence 10/29/2013  . Chest pain, unspecified 10/07/2013  . Essential hypertension 10/07/2013  . Anxiety state  01/08/2013  . Chronic obstructive airway disease with asthma ([place]) 11/18/2012  . Palpitations 10/22/2012  . Pain in finger of left hand 08/21/2012  . Hyperlipidemia 08/20/2012  . Hypothyroidism 08/20/2012  . COPD GOLD II/ still smoking  02/03/2011  . Chronic cough 02/03/2011  . Hyperglycemia 11/18/2010  . Cigarette smoker 11/10/2010   PCP:  Lauree Chandler, NP Pharmacy:   [facility] [place], [place] [address] Phone: [phone] Fax: [phone]     Social Determinants of Health (SDOH) Interventions    Readmission Risk Interventions No flowsheet data found.

## 2019-06-14 LAB — CBC
HCT: 44 % (ref 36.0–46.0)
Hemoglobin: 13.9 g/dL (ref 12.0–15.0)
MCH: 30.7 pg (ref 26.0–34.0)
MCHC: 31.6 g/dL (ref 30.0–36.0)
MCV: 97.1 fL (ref 80.0–100.0)
Platelets: 286 10*3/uL (ref 150–400)
RBC: 4.53 MIL/uL (ref 3.87–5.11)
RDW: 13.2 % (ref 11.5–15.5)
WBC: 11.3 10*3/uL — ABNORMAL HIGH (ref 4.0–10.5)
nRBC: 0 % (ref 0.0–0.2)

## 2019-06-14 LAB — BASIC METABOLIC PANEL
Anion gap: 8 (ref 5–15)
BUN: 23 mg/dL (ref 8–23)
CO2: 30 mmol/L (ref 22–32)
Calcium: 8.7 mg/dL — ABNORMAL LOW (ref 8.9–10.3)
Chloride: 101 mmol/L (ref 98–111)
Creatinine, Ser: 0.61 mg/dL (ref 0.44–1.00)
GFR calc Af Amer: >60 mL/min (ref >60)
GFR calc non Af Amer: >60 mL/min (ref >60)
Glucose, Bld: 142 mg/dL — ABNORMAL HIGH (ref 70–99)
Potassium: 4.1 mmol/L (ref 3.5–5.1)
Sodium: 139 mmol/L (ref 135–145)

## 2019-06-14 LAB — CULTURE, BLOOD (ROUTINE X 2)
Culture: NO GROWTH
Culture: NO GROWTH
Special Requests: ADEQUATE
Special Requests: ADEQUATE

## 2019-06-14 LAB — MAGNESIUM: Magnesium: 2.3 mg/dL (ref 1.7–2.4)

## 2019-06-14 NOTE — Progress Notes (Signed)
SATURATION QUALIFICATIONS: (This note is used to comply with regulatory documentation for home oxygen)  Patient Saturations on Room Air at Rest = 86%  Patient Saturations on Room Air while Ambulating = 86%  Patient Saturations on 4 Liters of oxygen while Ambulating = 91-92%  Please briefly explain why patient needs home oxygen:

## 2019-06-14 NOTE — Progress Notes (Signed)
PROGRESS NOTE  Gabrielle White [id number] DOB: 13-Sep-1945 DOA: 06/09/2019 PCP: Lauree Chandler, NP   LOS: 4 days   Brief narrative: As per HPI,  Patient is 74 year old female with history of COPD/emphysema, anxiety, depression, insomnia, hypertension, hyperlipidemia, tobacco abuse who presented to the ED from her pulmonologist office (Dr. Melvyn Novas) due to progressively worsening dyspnea for 4 to 5 days with associated wheezing without responding to albuterol at home.  Patient also had increased sputum production. ED Course:Initial vital signs temperature 98.9 F, pulse 92, respirations 22, blood pressure 132/81 mmHg and O2 sat 91% on room air. The patient was given bronchodilator and 60 mg of prednisone. She became hypoxic and tachypneic with ambulation, so our service was asked to evaluate for further treatment. CBC shows white count 11.3, hemoglobin 15.6 g/dL and platelets 322. PT 12.3 and INR 0.9. Venous blood gas showed a decreased PO2 of 48.30 mmHg and bicarbonate of 29.7 mmol/L. CMP showed a glucose of 121 mg/dL, but was otherwise normal. Lactic acid was 1.1 mmol/L. SARS coronavirus 2, influenza A and B by PCR was negative. Her chest radiograph did not have any acute cardiopulmonary disease, but showed hyperinflation compatible with emphysema.   Assessment/Plan:  Principal Problem:   Acute respiratory failure with hypoxemia (HCC) Active Problems:   Cigarette smoker   Hyperlipidemia   Hypothyroidism   Hyperglycemia   Essential hypertension   Depression   Polycythemia   COPD exacerbation (HCC)  Acute hypoxic respiratory failure secondary to COPD exacerbation.  Likely secondary to viral illness and ongoing smoking.. Patient has been seen by pulmonary.  Patient likely has gold stage II COPD.  Pulmonary has an impression that patient has been advancing in her COPD due to continued smoking as evidenced by declining FEV1.  Patient continues to smoke until just recently.  Patient  has been optimized on bronchodilators steroids and is currently on 3 L of oxygen.  Has felt little better today but still has a significant wheezing.  Unclear whether patient does have baseline wheezing at home.  It is likely that patient will need home oxygen on discharge.  Essential hypertension on losartan.  Continue.  Closely monitor  Polycythemia secondary to COPD.    Hyperlipidemia on atorvastatin  Hypothyroidism on levothyroxine  Depression on sertraline  GERD Continue Protonix,   Current smoking.  Counseled about it.  Continue nicotine patch.  VTE Prophylaxis: Lovenox subcu  Code Status: Full code  Family Communication: None today.    Disposition Plan:  . Patient is from home . Likely disposition to home with home health by tomorrow she continues to improve..  Seen by physical therapy recommend home health on discharge. . Barriers to discharge: Still on 3 L of oxygen.  Not quite at baseline with dyspnea, frequent nebs, IV steroids.  Likely need oxygen on discharge.  We will continue to wean as able  Consultants:  Pulmonary  Procedures:  None  Antibiotics:  . Azithromycin  Anti-infectives (From admission, onward)   Start     Dose/Rate Route Frequency Ordered Stop   06/11/19 1000  azithromycin (ZITHROMAX) tablet 250 mg     250 mg Oral Daily 06/10/19 1642 06/14/19 0844   06/10/19 1645  azithromycin (ZITHROMAX) tablet 500 mg     500 mg Oral Daily 06/10/19 1642 06/10/19 1723      Subjective:  Today, patient was seen and examined at bedside.  Patient feels a little better with the breathing.  Still has wheezing and dyspnea on exertion.  On supplemental  oxygen at 3 L/min.  Does not Feel quite at her baseline.  Objective: Vitals:   06/14/19 0742 06/14/19 0748  BP:    Pulse:    Resp:    Temp:    SpO2: 96% 96%    Intake/Output Summary (Last 24 hours) at 06/14/2019 1116 Last data filed at 06/14/2019 0830 Gross per 24 hour  Intake 320 ml  Output 1450 ml    Net -1130 ml   Filed Weights   06/09/19 1041  Weight: 71.2 kg   Body mass index is 26.13 kg/m.   Physical Exam:  General:  Average built, not in obvious distress, on nasal cannula 3 L/min. HENT: Normocephalic, pupils equally reacting to light and accommodation.  No scleral pallor or icterus noted. Oral mucosa is moist.  Chest: Decreased breath sounds bilaterally.  Diffuse wheezing noted bilaterally. CVS: S1 &S2 heard. No murmur.  Regular rate and rhythm. Abdomen: Soft, nontender, nondistended.  Bowel sounds are heard.  Liver is not palpable, no abdominal mass palpated Extremities: No cyanosis, clubbing or edema.  Peripheral pulses are palpable. Psych: Alert, awake and oriented, normal mood CNS:  No cranial nerve deficits.  Power equal in all extremities.  No sensory deficits noted.  No cerebellar signs.   Skin: Warm and dry.  No rashes noted.   Data Review: I have personally reviewed the following laboratory data and studies,  CBC: Recent Labs  Lab 06/09/19 1105 06/10/19 0805 06/11/19 0540 06/12/19 0444 06/14/19 0536  WBC 11.3* 11.6* 14.3* 12.8* 11.3*  NEUTROABS 7.6  --   --   --   --   HGB 15.6* 14.5 14.0 14.9 13.9  HCT 49.8* 45.3 43.5 48.8* 44.0  MCV 97.5 96.6 98.0 100.2* 97.1  PLT 322 316 286 317 [id number]   Basic Metabolic Panel: Recent Labs  Lab 06/09/19 1105 06/10/19 0805 06/11/19 0540 06/12/19 0444 06/14/19 0536  NA 140 140 139 142 139  K 4.0 4.0 3.6 4.1 4.1  CL 103 105 106 104 101  CO2 30 26 26 28 30   GLUCOSE 121* 133* 114* 100* 142*  BUN 15 16 26* 19 23  CREATININE 0.76 0.66 0.73 0.64 0.61  CALCIUM 9.4 9.2 8.5* 8.8* 8.7*  MG 2.1  --   --  2.2 2.3  PHOS 4.0  --   --   --   --    Liver Function Tests: Recent Labs  Lab 06/09/19 1105  AST 17  ALT 21  ALKPHOS 118  BILITOT 0.8  PROT 7.5  ALBUMIN 4.2   No results for input(s): LIPASE, AMYLASE in the last 168 hours. No results for input(s): AMMONIA in the last 168 hours. Cardiac Enzymes: No results  for input(s): CKTOTAL, CKMB, CKMBINDEX, TROPONINI in the last 168 hours. BNP (last 3 results) No results for input(s): BNP in the last 8760 hours.  ProBNP (last 3 results) No results for input(s): PROBNP in the last 8760 hours.  CBG: No results for input(s): GLUCAP in the last 168 hours. Recent Results (from the past 240 hour(s))  Culture, blood (Routine x 2)     Status: None (Preliminary result)   Collection Time: 06/09/19 11:05 AM   Specimen: BLOOD  Result Value Ref Range Status   Specimen Description   Final    BLOOD LEFT ANTECUBITAL Performed at [facility] [address], [place], [place] [address]    Special Requests   Final    BOTTLES DRAWN AEROBIC AND ANAEROBIC Blood Culture adequate volume Performed at [facility]  [place] [address], [place], [place] [address]    Culture   Final    NO GROWTH 4 DAYS Performed at [facility], [place] [address] [place], [place] [address]    Report Status PENDING  Incomplete  Culture, blood (Routine x 2)     Status: None (Preliminary result)   Collection Time: 06/09/19 11:13 AM   Specimen: BLOOD  Result Value Ref Range Status   Specimen Description   Final    BLOOD RIGHT ANTECUBITAL Performed at [facility] [address] [place], [place] [address]    Special Requests   Final    BOTTLES DRAWN AEROBIC AND ANAEROBIC Blood Culture adequate volume Performed at [facility] [address], [place], [place] [address]    Culture   Final    NO GROWTH 4 DAYS Performed at [facility], [place] [address], [place], [place] [address]    Report Status PENDING  Incomplete  Respiratory Panel by RT PCR (Flu A&B, Covid) - Nasopharyngeal Swab     Status: None   Collection Time: 06/09/19 12:15 PM   Specimen: Nasopharyngeal Swab  Result Value Ref Range Status   SARS Coronavirus 2 by RT PCR NEGATIVE NEGATIVE Final    Comment: (NOTE) SARS-CoV-2 target nucleic acids  are NOT DETECTED. The SARS-CoV-2 RNA is generally detectable in upper respiratoy specimens during the acute phase of infection. The lowest concentration of SARS-CoV-2 viral copies this assay can detect is 131 copies/mL. A negative result does not preclude SARS-Cov-2 infection and should not be used as the sole basis for treatment or other patient management decisions. A negative result may occur with  improper specimen collection/handling, submission of specimen other than nasopharyngeal swab, presence of viral mutation(s) within the areas targeted by this assay, and inadequate number of viral copies (<131 copies/mL). A negative result must be combined with clinical observations, patient history, and epidemiological information. The expected result is Negative. Fact Sheet for Patients:  PinkCheek.be Fact Sheet for Healthcare Providers:  GravelBags.it This test is not yet ap proved or cleared by the [place] FDA and  has been authorized for detection and/or diagnosis of SARS-CoV-2 by FDA under an Emergency Use Authorization (EUA). This EUA will remain  in effect (meaning this test can be used) for the duration of the COVID-19 declaration under Section 564(b)(1) of the Act, 21 U.S.C. section 360bbb-3(b)(1), unless the authorization is terminated or revoked sooner.    Influenza A by PCR NEGATIVE NEGATIVE Final   Influenza B by PCR NEGATIVE NEGATIVE Final    Comment: (NOTE) The Xpert Xpress SARS-CoV-2/FLU/RSV assay is intended as an aid in  the diagnosis of influenza from Nasopharyngeal swab specimens and  should not be used as a sole basis for treatment. Nasal washings and  aspirates are unacceptable for Xpert Xpress SARS-CoV-2/FLU/RSV  testing. Fact Sheet for Patients: PinkCheek.be Fact Sheet for Healthcare Providers: GravelBags.it This test is not yet approved or  cleared by the [place] FDA and  has been authorized for detection and/or diagnosis of SARS-CoV-2 by  FDA under an Emergency Use Authorization (EUA). This EUA will remain  in effect (meaning this test can be used) for the duration of the  Covid-19 declaration under Section 564(b)(1) of the Act, 21  U.S.C. section 360bbb-3(b)(1), unless the authorization is  terminated or revoked. Performed at [facility], [place] [address], [place], [place] [address]      Studies: No results found.  Flora Lipps, MD  [facility] 06/14/2019

## 2019-06-14 NOTE — Progress Notes (Signed)
Physical Therapy Treatment Patient Details Name: Gabrielle White MRN: [id number] DOB: 09-21-1945 Today's Date: 06/14/2019    History of Present Illness 74 yo female admitted with COPD exac. Hx of COPD, anxiety, depression, OA    PT Comments    Pt agreeable to working with PT on today. She appears to feel better than she has the last couple of days. Pt walked ~150 feet x 2 on 4L [place] O2-sats 92%. Took seated rest break after pt experienced a coughing spell. Used rollator today for increased steadiness and for energy conservation. Continue to recommend HHPT for balance training. Unsure if pt will be agreeable to using a walker for ambulation    Follow Up Recommendations  Home health PT;Supervision for mobility/OOB     Equipment Recommendations  (may need to consider using a RW vs rollator if balance doesn't improve)    Recommendations for Other Services       Precautions / Restrictions Precautions Precautions: Fall Precaution Comments: monitor O2; hx of falls Restrictions Weight Bearing Restrictions: No    Mobility  Bed Mobility Overal bed mobility: Modified Independent                Transfers Overall transfer level: Needs assistance Equipment used: 4-wheeled walker Transfers: Sit to/from Stand Sit to Stand: Supervision         General transfer comment: for safety  Ambulation/Gait Ambulation/Gait assistance: Supervision Gait Distance (Feet): 150 Feet(x2) Assistive device: 4-wheeled walker Gait Pattern/deviations: Decreased stride length;Step-through pattern     General Gait Details: supervision level with use of walker. O2 92% on 4L [place]. seated rest break taken after ~150-pt experiencing coughing spell   Stairs             Wheelchair Mobility    Modified Rankin (Stroke Patients Only)       Balance Overall balance assessment: Needs assistance           Standing balance-Leahy Scale: Fair                               Cognition Arousal/Alertness: Awake/alert Behavior During Therapy: WFL for tasks assessed/performed Overall Cognitive Status: Within Functional Limits for tasks assessed                                        Exercises      General Comments        Pertinent Vitals/Pain Pain Assessment: No/denies pain    Home Living                      Prior Function            PT Goals (current goals can now be found in the care plan section) Progress towards PT goals: Progressing toward goals    Frequency    Min 3X/week      PT Plan Current plan remains appropriate    Co-evaluation              AM-PAC PT "6 Clicks" Mobility   Outcome Measure  Help needed turning from your back to your side while in a flat bed without using bedrails?: None Help needed moving from lying on your back to sitting on the side of a flat bed without using bedrails?: None Help needed moving to and from a bed to a chair (including  a wheelchair)?: A Little Help needed standing up from a chair using your arms (e.g., wheelchair or bedside chair)?: A Little Help needed to walk in hospital room?: A Little Help needed climbing 3-5 steps with a railing? : A Little 6 Click Score: 20    End of Session Equipment Utilized During Treatment: Oxygen Activity Tolerance: Patient tolerated treatment well Patient left: in bed;with call bell/phone within reach;with bed alarm set   PT Visit Diagnosis: History of falling (Z91.81);Difficulty in walking, not elsewhere classified (R26.2);Unsteadiness on feet (R26.81)     Time: [id number] PT Time Calculation (min) (ACUTE ONLY): 16 min  Charges:  $Gait Training: 8-22 mins                         Doreatha Massed, PT Acute Rehabilitation

## 2019-06-14 NOTE — Progress Notes (Signed)
NAME:  Gabrielle White, MRN:  [id number], DOB:  April 15, 1945, LOS: 4 ADMISSION DATE:  06/09/2019, CONSULTATION DATE: 3/12/20213/12/21 REFERRING MD: Dr. Louanne Belton, CHIEF COMPLAINT: Acute exacerbation  Brief History   Elderly lady with a history of COPD came in with worsening shortness of breath of a few weeks duration Follows up with Dr. Melvyn Novas as an outpatient She claims she quit smoking about a week prior to coming into the hospital Persistent shortness of breath, wheezing, coughing despite ongoing treatment  Past Medical History   Past Medical History:  Diagnosis Date  . Chronic airway obstruction, not elsewhere classified   . Depressive disorder   . Depressive disorder, not elsewhere classified   . Emphysema   . Enthesopathy of ankle and tarsus, unspecified   . HTN (hypertension)   . Hyperlipidemia   . Hypertension   . Hypopotassemia   . Leukocytosis   . Migraine, unspecified, without mention of intractable migraine without mention of status migrainosus   . Nonspecific (abnormal) findings on radiological and other examination of skull and head   . Osteoarthrosis, unspecified whether generalized or localized, unspecified site   . Other emphysema ([place])   . Other specified disease of white blood cells   . Palpitations   . RMSF [facility] spotted fever) 08/23/2015   Positive IgM. Treated with doxycycline.   . Tobacco abuse    Significant Hospital Events   Persistent wheezing  Consults:  pccm  Procedures:  None  Significant Diagnostic Tests:  Chest x-ray on 311 reviewed by myself showing no acute infiltrate, does show evidence of emphysema  Micro Data:  No growth on blood culture 3/8  Antimicrobials:  Azithromycin 3/9>>  Interim history/subjective:  Feels a little bit better Slept well last night Did have an episode of anxiety in the evening yesterday  Objective   Blood pressure (!) 149/81, pulse 81, temperature 98.7 F (37.1 C), resp. rate 17, height 5\' 5"   (1.651 m), weight 71.2 kg, SpO2 96 %.        Intake/Output Summary (Last 24 hours) at 06/14/2019 1009 Last data filed at 06/14/2019 0830 Gross per 24 hour  Intake 320 ml  Output 1450 ml  Net -1130 ml   Filed Weights   06/09/19 1041  Weight: 71.2 kg    Examination: General: Elderly lady, does not appear to be in distress HENT: Sore mucosa moist oral mucosa Lungs: Decreased air movement bilaterally, bilateral rhonchi Cardiovascular: S1-S2 appreciated Abdomen: All sounds appreciated Extremities: No clubbing, no edema Neuro: Alert and oriented x3 GU:   Resolved Hospital Problem list     Assessment & Plan:  Gold stage II COPD Acute exacerbation of COPD -Continue bronchodilators -Continue steroids, on nebulized steroids and IV steroids -Antibiotics completed  Recent smoker -She stated she is quit smoking a week prior to coming into the hospital -Counseled about smoking risk and risk of progressive disease  Acute hypoxemic respiratory failure -She continues to require oxygen supplementation at present -Goal saturations greater than 88% -May need to be evaluated for home oxygen need  Other comorbidities include depression, hypertension, hypothyroidism, anxiety  She remains optimally treated at the present time She still has a lot of rhonchi but she states that this is how she breathes normally at home She has underlying severe lung disease with possible viral exacerbation at present Not really coughing up any secretions at the present time to suggest mucous plugging  We are likely dealing with very advanced disease as evidenced by FEV1 declined from 1.74 tom  1.31 to 1.19 on her PFTs, with the last PFT being performed in 2019, FEV1 is likely low at present as she was still smoking up until recently  Continue lines of care She will try to ambulate today   Sherrilyn Rist, MD [place], PCCM

## 2019-06-15 DIAGNOSIS — R739 Hyperglycemia, unspecified: Secondary | ICD-10-CM

## 2019-06-15 MED ORDER — GUAIFENESIN-DM 100-10 MG/5ML PO SYRP: 5.0000 mL | ORAL_SOLUTION | ORAL | 0 refills | Status: DC | PRN

## 2019-06-15 MED ORDER — NICOTINE 14 MG/24HR TD PT24: 14.0000 mg | MEDICATED_PATCH | Freq: Every day | TRANSDERMAL | 0 refills | Status: DC

## 2019-06-15 MED ORDER — PREDNISONE 10 MG PO TABS: ORAL_TABLET | ORAL | 0 refills | Status: AC

## 2019-06-15 MED ORDER — PANTOPRAZOLE SODIUM 40 MG PO TBEC: 40.0000 mg | DELAYED_RELEASE_TABLET | Freq: Every day | ORAL | 0 refills | Status: DC

## 2019-06-15 MED ORDER — BENZONATATE 100 MG PO CAPS: 100.0000 mg | ORAL_CAPSULE | Freq: Two times a day (BID) | ORAL | 0 refills | Status: DC | PRN

## 2019-06-15 NOTE — Progress Notes (Signed)
NAME:  Gabrielle White, MRN:  [id number], DOB:  Dec 15, 1945, LOS: 5 ADMISSION DATE:  06/09/2019, CONSULTATION DATE: 3/12/20213/12/21 REFERRING MD: Dr. Louanne Belton, CHIEF COMPLAINT: Acute exacerbation  Brief History   Elderly lady with a history of COPD came in with worsening shortness of breath of a few weeks duration Follows up with Dr. Melvyn Novas as an outpatient She claims she quit smoking about a week prior to coming into the hospital Persistent shortness of breath, wheezing, coughing despite ongoing treatment  Past Medical History   Past Medical History:  Diagnosis Date  . Chronic airway obstruction, not elsewhere classified   . Depressive disorder   . Depressive disorder, not elsewhere classified   . Emphysema   . Enthesopathy of ankle and tarsus, unspecified   . HTN (hypertension)   . Hyperlipidemia   . Hypertension   . Hypopotassemia   . Leukocytosis   . Migraine, unspecified, without mention of intractable migraine without mention of status migrainosus   . Nonspecific (abnormal) findings on radiological and other examination of skull and head   . Osteoarthrosis, unspecified whether generalized or localized, unspecified site   . Other emphysema ([place])   . Other specified disease of white blood cells   . Palpitations   . [facility] spotted fever) 08/23/2015   Positive IgM. Treated with doxycycline.   . Tobacco abuse    Significant Hospital Events   Persistent wheezing  Consults:  pccm  Procedures:  None  Significant Diagnostic Tests:  Chest x-ray on 3/11 reviewed by myself showing no acute infiltrate, does show evidence of emphysema  Micro Data:  No growth on blood culture 3/8  Antimicrobials:  Azithromycin 3/9-3/13  Interim history/subjective:  Feels a little bit better Slept well last night Still with shortness of breath but overall improved  Objective   Blood pressure 137/75, pulse 76, temperature 98 F (36.7 C), temperature source Oral, resp. rate  17, height 5\' 5"  (1.651 m), weight 71.2 kg, SpO2 92 %.        Intake/Output Summary (Last 24 hours) at 06/15/2019 1132 Last data filed at 06/15/2019 [id number] Gross per 24 hour  Intake 240 ml  Output 1300 ml  Net -1060 ml   Filed Weights   06/09/19 1041  Weight: 71.2 kg    Examination: General: Elderly lady, comfortable at rest HENT: Moist oral mucosa Lungs: Decreased air movement at bases, rhonchi Cardiovascular: S1-S2 appreciated Abdomen: Bowel sounds appreciated Extremities: No clubbing, no edema  Resolved Hospital Problem list     Assessment & Plan:  Gold stage II COPD Acute exacerbation of COPD -Completed antibiotics -On steroids, bronchodilators  Recent smoker -Counseled about the risk of smoking -Stated she quit a week prior to coming into the hospital  Acute hypoxemic respiratory failure -Continues to require oxygen supplementation -Goal saturations to be greater than 88% -Appears she will need oxygen supplementation at home  Other comorbidities include depression, hypertension, hypothyroidism, anxiety  Optimally treated at present She has severe underlying lung disease Not really coughing up any significant secretions, unlikely that she has mucous plugging Of note FEV1 has gradually decreased over the last 3 to 4 years and is probably worse than the last one that was recorded at 1.19 in 2019  Continue lines of care She was able to ambulate 3/13 on 3/14    Discharge planning Will need oxygen supplementation Schedule follow-up with Dr. Melvyn Novas whom she recently saw in the office  Pulmonary will sign off at present  Sherrilyn Rist, MD [place], [place]

## 2019-06-15 NOTE — Progress Notes (Signed)
SATURATION QUALIFICATIONS: (This note is used to comply with regulatory documentation for home oxygen)  Patient Saturations on Room Air at Rest =91%  Patient Saturations on Room Air while Ambulating = 83%  Patient Saturations on 4 Liters of oxygen while Ambulating =90 %  Please briefly explain why patient needs home oxygen:After 4 minutes of rest O2 sat returned to 95% on 4 L.

## 2019-06-15 NOTE — Progress Notes (Signed)
Physical Therapy Treatment Patient Details Name: Gabrielle White MRN: [id number] DOB: 12-24-1945 Today's Date: 06/15/2019    History of Present Illness 74 yo female admitted with COPD exac. Hx of COPD, anxiety, depression, OA    PT Comments    Patient resting in bed awaiting breakfast tray when PT arrived. She agreed to get up to chair to be in better position for breakfast ,and explained that later we would do a walk test to determine oxygen use/need for home. She was 91% SPO2 at rest, seated. After 120 feet ambulating in hall(RW) with continuous monitoring for SPO2, O2 sats dropped to 83%- she did have frequent coughing. Applied 3 LPM, [place] O2, and after 4 minutes O2 was 90%- increased to 4 LPM, and at rest immediately following another 120 feet walk with RW, SPO2 was 93%, but at rest for 4 minutes, SPO2 was 94-95%. She is supposed to be discharged to home today. Should benefit from ongoing [facility] in home for optimal functional outcomes. She is very receptive to PT.  Follow Up Recommendations  Home health PT;Supervision for mobility/OOB     Equipment Recommendations       Recommendations for Other Services       Precautions / Restrictions Precautions Precautions: Fall Precaution Comments: monitor O2; hx of falls Restrictions Weight Bearing Restrictions: No    Mobility  Bed Mobility Overal bed mobility: Modified Independent             General bed mobility comments: seated rest break needed once EOB. cues for pursed lip/deep breathing  Transfers Overall transfer level: Needs assistance Equipment used: Rolling walker (2 wheeled)(Preferred to use RW- not rollator, and no LOB  noted) Transfers: Sit to/from Stand Sit to Stand: Supervision         General transfer comment: for safety  Ambulation/Gait Ambulation/Gait assistance: Supervision     Gait Pattern/deviations: Decreased stride length;Step-through pattern     General Gait Details: On room air seated- 91%; after  125 feet with RW and monitoring, RA- dropped in sats to 83%; Applied 3 LPM initially via [place], but only recovered to 90- upped to 4 LPM, [place] and after 4 minutes came up to 94-95% on oxygen.-frequent active coughing.   Stairs             Wheelchair Mobility    Modified Rankin (Stroke Patients Only)       Balance Overall balance assessment: Needs assistance   Sitting balance-Leahy Scale: Good       Standing balance-Leahy Scale: Fair(F+,G- , but steady with RW)                              Cognition Arousal/Alertness: Awake/alert Behavior During Therapy: WFL for tasks assessed/performed Overall Cognitive Status: Within Functional Limits for tasks assessed                                 General Comments: She was assisted bed to chair with min guard and cues- after 10 minutes rest, did walk test to determine need for oxygen at home      Exercises Other Exercises Other Exercises: Reminded to practice Flutter valve unit hourly- 8-10 breaths. Also reminded to perform proprer pursed lip breathing technique Other Exercises: Reminded to do LE ex format and she has printed HEP    General Comments General comments (skin integrity, edema, etc.): Monitor closely for skin  and joint integrity.      Pertinent Vitals/Pain Pain Assessment: No/denies pain    Home Living                      Prior Function            PT Goals (current goals can now be found in the care plan section) Acute Rehab PT Goals Patient Stated Goal: to improve balance and avoid falls PT Goal Formulation: With patient Time For Goal Achievement: 06/24/19 Potential to Achieve Goals: Good Progress towards PT goals: Progressing toward goals    Frequency    Min 3X/week      PT Plan Current plan remains appropriate    Co-evaluation              AM-PAC PT "6 Clicks" Mobility   Outcome Measure  Help needed turning from your back to your side while in a flat  bed without using bedrails?: None Help needed moving from lying on your back to sitting on the side of a flat bed without using bedrails?: None Help needed moving to and from a bed to a chair (including a wheelchair)?: A Little Help needed standing up from a chair using your arms (e.g., wheelchair or bedside chair)?: A Little Help needed to walk in hospital room?: A Little Help needed climbing 3-5 steps with a railing? : A Little 6 Click Score: 20    End of Session Equipment Utilized During Treatment: Oxygen Activity Tolerance: Patient tolerated treatment well(Walk test noted) Patient left: in bed;with call bell/phone within reach;with bed alarm set   PT Visit Diagnosis: History of falling (Z91.81);Difficulty in walking, not elsewhere classified (R26.2);Unsteadiness on feet (R26.81)     Time: [id number] PT Time Calculation (min) (ACUTE ONLY): 31 min  Charges:  $Gait Training: 8-22 mins $Therapeutic Activity: 8-22 mins                    Rollen Sox, PT # [phone] CGV cell   Casandra Doffing 06/15/2019, 10:36 AM

## 2019-06-15 NOTE — Discharge Summary (Signed)
Physician Discharge Summary  Gabrielle White [id number] DOB: 05/03/1945 DOA: 06/09/2019  PCP: Lauree Chandler, NP  Admit date: 06/09/2019 Discharge date: 06/15/2019  Admitted From: Home  Discharge disposition: Home   Recommendations for Outpatient Follow-Up:    Follow up with your primary care provider in one week.   Check CBC, BMP in the next visit.  Follow-up with your pulmonary physician in 1 to 2 weeks  You have been referred for ambulatory pulmonary rehab.  Discharge Diagnosis:   Principal Problem:   Acute respiratory failure with hypoxemia (HCC) Active Problems:   Cigarette smoker   Hyperlipidemia   Hypothyroidism   Hyperglycemia   Essential hypertension   Depression   Polycythemia   COPD exacerbation ([place])   Discharge Condition: Improved.  Diet recommendation: Low sodium, heart healthy.  Wound care: None.  Code status: Full.   History of Present Illness:   Patient is 74 year old female with history of COPD/emphysema, anxiety, depression, insomnia, hypertension, hyperlipidemia, tobacco abuse who presented to the ED from her pulmonologist office (Dr. Melvyn Novas) due to progressively worsening dyspnea for 4 to 5 days with associated wheezing without responding to albuterol at home. Patient also had increased sputum production. ED Course:Initial vital signs temperature 98.9 F, pulse 92, respirations 22, blood pressure 132/81 mmHg and O2 sat 91% on room air. The patient was given bronchodilator and 60 mg of prednisone. She became hypoxic and tachypneic with ambulation, so our service was asked to evaluate for further treatment. CBC shows white count 11.3, hemoglobin 15.6 g/dL and platelets 322. PT 12.3 and INR 0.9. Venous blood gas showed a decreased PO2 of 48.30 mmHg and bicarbonate of 29.7 mmol/L. CMP showed a glucose of 121 mg/dL, but was otherwise normal. Lactic acid was 1.1 mmol/L. SARS coronavirus 2, influenza A and B by PCR was negative. Her chest  radiograph did not have any acute cardiopulmonary disease, but showed hyperinflation compatible with emphysema.  Hospital Course:   Following conditions were addressed during hospitalization as listed below,  Acute hypoxic respiratory failure secondary to COPD exacerbation.Likely secondary to viral illness and ongoing smoking.. Patient was seen by pulmonary during hospitalization.. Patient likely has gold stage II COPD. Pulmonary has an impression that patient has been advancing in her COPD due to continued smoking as evidenced by declining FEV1. Patient continues to smoke until just recently. Patient was  optimized on bronchodilators steroids but persisted to need 3 L of oxygen.  Patient will need oxygen on discharge.  Patient was emphasized the need for quitting smoking, follow-up with your pulmonary physician and will consider ambulatory pulmonary rehab follow-up.  Continue p.o. prednisone taper on discharge.  Essential hypertensionon losartan. Continue. Closely monitor  Polycythemia secondary to COPD.  Hyperlipidemiaon atorvastatin  Hypothyroidismon levothyroxine  Depression on sertraline  GERD Continue Protonix,  Current smoking. Counseled about it.  She is motivated to quit smoking.  Disposition.  At this time, patient is stable for disposition home with outpatient follow-up with primary care physician, pulmonary  Medical Consultants:    Pulmonary  Procedures:    None Subjective:   Today, patient feels little better than yesterday.  Has mild cough and wheezing.  Wishes to go home   Discharge Exam:   Vitals:   06/15/19 1027 06/15/19 1112  BP:    Pulse:    Resp:    Temp:    SpO2: 91% 92%   Vitals:   06/15/19 0737 06/15/19 0748 06/15/19 1027 06/15/19 1112  BP:      Pulse:  Resp:      Temp:      TempSrc:      SpO2: 96% 96% 91% 92%  Weight:      Height:        General: Alert awake, not in obvious distress, on nasal  cannula HENT: pupils equally reacting to light,  No scleral pallor or icterus noted. Oral mucosa is moist.  Chest: Decreased breath sounds bilaterally.  Mild wheezing. CVS: S1 &S2 heard. No murmur.  Regular rate and rhythm. Abdomen: Soft, nontender, nondistended.  Bowel sounds are heard.   Extremities: No cyanosis, clubbing or edema.  Peripheral pulses are palpable. Psych: Alert, awake and oriented, normal mood CNS:  No cranial nerve deficits.  Power equal in all extremities.   Skin: Warm and dry.  No rashes noted.  The results of significant diagnostics from this hospitalization (including imaging, microbiology, ancillary and laboratory) are listed below for reference.     Diagnostic Studies:   DG Chest 2 View  Result Date: 06/09/2019 CLINICAL DATA:  Shortness of breath worsening since 2 days ago. EXAM: CHEST - 2 VIEW COMPARISON:  05/07/2017 FINDINGS: Cardiac silhouette is normal in size. No mediastinal or hilar masses. No evidence of adenopathy. Lungs are hyperexpanded, but clear. No pleural effusion or pneumothorax. Skeletal structures are intact. IMPRESSION: 1. No acute cardiopulmonary disease. 2. Hyperexpanded lungs stable from the prior exam suggesting COPD. Electronically Signed   By: Lajean Manes M.D.   On: 06/09/2019 12:13     Labs:   Basic Metabolic Panel: Recent Labs  Lab 06/09/19 1105 06/09/19 1105 06/10/19 0805 06/10/19 0805 06/11/19 0540 06/11/19 0540 06/12/19 0444 06/14/19 0536  NA 140  --  140  --  139  --  142 139  K 4.0   < > 4.0   < > 3.6   < > 4.1 4.1  CL 103  --  105  --  106  --  104 101  CO2 30  --  26  --  26  --  28 30  GLUCOSE 121*  --  133*  --  114*  --  100* 142*  BUN 15  --  16  --  26*  --  19 23  CREATININE 0.76  --  0.66  --  0.73  --  0.64 0.61  CALCIUM 9.4  --  9.2  --  8.5*  --  8.8* 8.7*  MG 2.1  --   --   --   --   --  2.2 2.3  PHOS 4.0  --   --   --   --   --   --   --    < > = values in this interval not displayed.   GFR Estimated  Creatinine Clearance: 61.1 mL/min (by C-G formula based on SCr of 0.61 mg/dL). Liver Function Tests: Recent Labs  Lab 06/09/19 1105  AST 17  ALT 21  ALKPHOS 118  BILITOT 0.8  PROT 7.5  ALBUMIN 4.2   No results for input(s): LIPASE, AMYLASE in the last 168 hours. No results for input(s): AMMONIA in the last 168 hours. Coagulation profile Recent Labs  Lab 06/09/19 1105  INR 0.9    CBC: Recent Labs  Lab 06/09/19 1105 06/10/19 0805 06/11/19 0540 06/12/19 0444 06/14/19 0536  WBC 11.3* 11.6* 14.3* 12.8* 11.3*  NEUTROABS 7.6  --   --   --   --   HGB 15.6* 14.5 14.0 14.9 13.9  HCT 49.8* 45.3 43.5 48.8*  44.0  MCV 97.5 96.6 98.0 100.2* 97.1  PLT 322 316 286 317 286   Cardiac Enzymes: No results for input(s): CKTOTAL, CKMB, CKMBINDEX, TROPONINI in the last 168 hours. BNP: Invalid input(s): POCBNP CBG: No results for input(s): GLUCAP in the last 168 hours. D-Dimer No results for input(s): DDIMER in the last 72 hours. Hgb A1c No results for input(s): HGBA1C in the last 72 hours. Lipid Profile No results for input(s): CHOL, HDL, LDLCALC, TRIG, CHOLHDL, LDLDIRECT in the last 72 hours. Thyroid function studies No results for input(s): TSH, T4TOTAL, T3FREE, THYROIDAB in the last 72 hours.  Invalid input(s): FREET3 Anemia work up No results for input(s): VITAMINB12, FOLATE, FERRITIN, TIBC, IRON, RETICCTPCT in the last 72 hours. Microbiology Recent Results (from the past 240 hour(s))  Culture, blood (Routine x 2)     Status: None   Collection Time: 06/09/19 11:05 AM   Specimen: BLOOD  Result Value Ref Range Status   Specimen Description   Final    BLOOD LEFT ANTECUBITAL Performed at [facility] [address], [place], [place] [address]    Special Requests   Final    BOTTLES DRAWN AEROBIC AND ANAEROBIC Blood Culture adequate volume Performed at [facility] [address] [place], [place] [address]    Culture   Final    NO  GROWTH 5 DAYS Performed at [facility], [place] [address], [place], [place] [address]    Report Status 06/14/2019 FINAL  Final  Culture, blood (Routine x 2)     Status: None   Collection Time: 06/09/19 11:13 AM   Specimen: BLOOD  Result Value Ref Range Status   Specimen Description   Final    BLOOD RIGHT ANTECUBITAL Performed at [facility] [address] [place], [place] [address]    Special Requests   Final    BOTTLES DRAWN AEROBIC AND ANAEROBIC Blood Culture adequate volume Performed at [facility] [address], [place], [place] [address]    Culture   Final    NO GROWTH 5 DAYS Performed at [facility], [place] [address] [place], [place] [address]    Report Status 06/14/2019 FINAL  Final  Respiratory Panel by RT PCR (Flu A&B, Covid) - Nasopharyngeal Swab     Status: None   Collection Time: 06/09/19 12:15 PM   Specimen: Nasopharyngeal Swab  Result Value Ref Range Status   SARS Coronavirus 2 by RT PCR NEGATIVE NEGATIVE Final    Comment: (NOTE) SARS-CoV-2 target nucleic acids are NOT DETECTED. The SARS-CoV-2 RNA is generally detectable in upper respiratoy specimens during the acute phase of infection. The lowest concentration of SARS-CoV-2 viral copies this assay can detect is 131 copies/mL. A negative result does not preclude SARS-Cov-2 infection and should not be used as the sole basis for treatment or other patient management decisions. A negative result may occur with  improper specimen collection/handling, submission of specimen other than nasopharyngeal swab, presence of viral mutation(s) within the areas targeted by this assay, and inadequate number of viral copies (<131 copies/mL). A negative result must be combined with clinical observations, patient history, and epidemiological information. The expected result is Negative. Fact Sheet for Patients:  PinkCheek.be Fact Sheet for  Healthcare Providers:  GravelBags.it This test is not yet ap proved or cleared by the [place] FDA and  has been authorized for detection and/or diagnosis of SARS-CoV-2 by FDA under an Emergency Use Authorization (EUA). This EUA will  remain  in effect (meaning this test can be used) for the duration of the COVID-19 declaration under Section 564(b)(1) of the Act, 21 U.S.C. section 360bbb-3(b)(1), unless the authorization is terminated or revoked sooner.    Influenza A by PCR NEGATIVE NEGATIVE Final   Influenza B by PCR NEGATIVE NEGATIVE Final    Comment: (NOTE) The Xpert Xpress SARS-CoV-2/FLU/RSV assay is intended as an aid in  the diagnosis of influenza from Nasopharyngeal swab specimens and  should not be used as a sole basis for treatment. Nasal washings and  aspirates are unacceptable for Xpert Xpress SARS-CoV-2/FLU/RSV  testing. Fact Sheet for Patients: PinkCheek.be Fact Sheet for Healthcare Providers: GravelBags.it This test is not yet approved or cleared by the [place] FDA and  has been authorized for detection and/or diagnosis of SARS-CoV-2 by  FDA under an Emergency Use Authorization (EUA). This EUA will remain  in effect (meaning this test can be used) for the duration of the  Covid-19 declaration under Section 564(b)(1) of the Act, 21  U.S.C. section 360bbb-3(b)(1), unless the authorization is  terminated or revoked. Performed at [facility], [place] [address] [place], [place] [address]      Discharge Instructions:   Discharge Instructions    AMB referral to pulmonary rehabilitation   Complete by: As directed    Please select a program: Pulmonary Rehabilitation (COPD Gold 2,3,4)   COPD Diagnosis: (See requirements below): COPD-Gold 2: Moderate 50% </= FEV1 <80% predicted   Program Prescription:  O2 Administration by RT, EP, or RN if SpO2<88% Flutter  Valve if indicated     After initial evaluation and assessments completed: Virtual Based Care may be provided alone or in conjunction with Pulmonary Rehab/Respiratory Care services based on patient barriers.: Yes   Diet - low sodium heart healthy   Complete by: As directed    Discharge instructions   Complete by: As directed    Follow up with your primary care physician in one week. Follow up with Dr Karsten Fells, Pulmonary in 1-2 weeks. No smoking please. No overexertion. Use oxygen as prescribed.   Increase activity slowly   Complete by: As directed      Allergies as of 06/15/2019      Reactions   Cafergot    Codeine    Fenoprofen Calcium    Nalfon [fenoprofen]       Medication List    TAKE these medications   acetaminophen 500 MG tablet Commonly known as: TYLENOL Take 500 mg by mouth every 6 (six) hours as needed for mild pain or headache.   albuterol 108 (90 Base) MCG/ACT inhaler Commonly known as: ProAir HFA INHALE 1 TO 2 PUFF INTO THE LUNGS EVERY 6 HOURS AS NEEDED FOR WHEEZING OR SHORTNESS OF BREATH What changed:   how much to take  how to take this  when to take this  reasons to take this  additional instructions   ALPRAZolam 0.5 MG tablet Commonly known as: XANAX TAKE 1 TABLET BY MOUTH TWICE DAILY AS NEEDED FOR NERVOUSNESS/SLEEP What changed: See the new instructions.   atorvastatin 40 MG tablet Commonly known as: LIPITOR TAKE 1 TABLET BY MOUTH DAILY FOR HIGH CHOLESTEROL What changed: See the new instructions.   benzonatate 100 MG capsule Commonly known as: TESSALON Take 1 capsule (100 mg total) by mouth 2 (two) times daily as needed for cough.   Breztri Aerosphere 160-9-4.8 MCG/ACT Aero Generic drug: Budeson-Glycopyrrol-Formoterol Inhale 2 puffs into the lungs 2 (two) times daily.   guaiFENesin-dextromethorphan  100-10 MG/5ML syrup Commonly known as: ROBITUSSIN DM Take 5 mLs by mouth every 4 (four) hours as needed for cough.   levothyroxine 125 MCG  tablet Commonly known as: SYNTHROID Take 1 tablet (125 mcg total) by mouth daily.   losartan 50 MG tablet Commonly known as: COZAAR TAKE 1 TABLET(50 MG) BY MOUTH DAILY What changed: See the new instructions.   nicotine 14 mg/24hr patch Commonly known as: NICODERM CQ - dosed in mg/24 hours Place 1 patch (14 mg total) onto the skin daily. Start taking on: June 16, 2019   pantoprazole 40 MG tablet Commonly known as: PROTONIX Take 1 tablet (40 mg total) by mouth daily before breakfast. Start taking on: June 16, 2019   predniSONE 10 MG tablet Commonly known as: DELTASONE Take 4 tablets (40 mg total) by mouth daily with breakfast for 2 days, THEN 3 tablets (30 mg total) daily with breakfast for 2 days, THEN 2 tablets (20 mg total) daily with breakfast for 2 days, THEN 1 tablet (10 mg total) daily with breakfast for 1 day. Start taking on: June 15, 2019   sertraline 100 MG tablet Commonly known as: ZOLOFT TAKE 1 TABLET BY MOUTH DAILY   Vitamin D3 25 MCG (1000 UT) Caps Take 1 capsule by mouth daily.            Durable Medical Equipment  (From admission, onward)         Start     Ordered   06/15/19 0733  For home use only DME oxygen  Once    Question Answer Comment  Length of Need Lifetime   Mode or (Route) Nasal cannula   Liters per Minute 3   Frequency Continuous (stationary and portable oxygen unit needed)   Oxygen conserving device Yes   Oxygen delivery system Gas      06/15/19 0733         Follow-up Information    Lauree Chandler, NP. Schedule an appointment as soon as possible for a visit in 1 week(s).   Specialty: Geriatric Medicine Why: for regular followup Contact information: [place]. [place] [address] [phone]        Tanda Rockers, MD. Schedule an appointment as soon as possible for a visit in 1 week(s).   Specialty: Pulmonary Disease Why: COPD followup Contact information: [place]  [address] [phone]            Time coordinating discharge: 39 minutes  Signed:  Claire Dolores  [facility] 06/15/2019, 11:27 AM

## 2019-06-15 NOTE — Progress Notes (Signed)
Discharged instructions explained to Gabrielle White and questions answered. Medications that  Are due next written on discharge papers. Waiting for Oxygen tank to be delivered to her room. Son will pick up patient and bring her home. She has medications to be picked up at her Pharmacy. Patient is using 3 L O2 via nasal canula.

## 2019-06-15 NOTE — TOC Progression Note (Signed)
Transition of Care [facility]) - Progression Note    Patient Details  Name: Gabrielle White MRN: [id number] Date of Birth: 1945-05-12  [facility] CM/SW Contact  Joaquin Courts, RN Phone Number: 06/15/2019, 11:43 AM  Clinical Narrative:  Patient reports she will be going today and her son will transport.  Patient set up for home O2 with [employer], agency to deliver portable tank to bedside for transport home.      Expected Discharge Plan: [place] Barriers to Discharge: Continued Medical Work up  Expected Discharge Plan and Services Expected Discharge Plan: [place] In-house Referral: Clinical Social Work   Post Acute Care Choice: [place] arrangements for the past 2 months: [place] Expected Discharge Date: 06/15/19               DME Arranged: Oxygen DME Agency: [employer] Date DME Agency Contacted: 06/15/19 Time DME Agency Contacted: [phone] Representative spoke with at DME Agency: Learta Codding             Social Determinants of Health ([place]) Interventions    Readmission Risk Interventions No flowsheet data found.

## 2019-06-16 ENCOUNTER — Telehealth: Payer: Self-pay | Admitting: *Deleted

## 2019-06-16 DIAGNOSIS — J449 Chronic obstructive pulmonary disease, unspecified: Secondary | ICD-10-CM | POA: Diagnosis not present

## 2019-06-16 NOTE — Telephone Encounter (Signed)
Transition Care Management Follow-up Telephone Call  Date of discharge and from where: 06/15/19 [place]  How have you been since you were released from the hospital? Better, walking around and just fine  Any questions or concerns? No   Items Reviewed:  Did the pt receive and understand the discharge instructions provided? Yes   Medications obtained and verified? Yes   Any new allergies since your discharge? No   Dietary orders reviewed? Yes  Do you have support at home? Yes   Other (ie: DME, Home Health, etc) Home Health but feels like she doesn't need them.   Functional Questionnaire: (I = Independent and D = Dependent) ADL's: I  Bathing/Dressing- I   Meal Prep- I  Eating- I  Maintaining continence- I  Transferring/Ambulation- I  Managing Meds- I   Follow up appointments reviewed:    PCP Hospital f/u appt confirmed? Yes  Scheduled to see Janett Billow on 06/23/19.  [place] [facility] f/u appt confirmed? Yes Scheduled to see Pulmonologist  on Friday.  Are transportation arrangements needed? No   If their condition worsens, is the pt aware to call  their PCP or go to the ED? Yes  Was the patient provided with contact information for the PCP's office or ED? Yes  Was the pt encouraged to call back with questions or concerns? Yes

## 2019-06-19 ENCOUNTER — Telehealth (HOSPITAL_COMMUNITY): Payer: Self-pay

## 2019-06-19 NOTE — Telephone Encounter (Signed)
Pt insurance is active and benefits verified through [place] $10, DED 0/0 met, out of pocket $3,900/$50 met, co-insurance 0%. no pre-authorization required. Passport, 06/19/2019'@9' :24am, REF# [id number]  Will contact patient to see if she is interested in the Cardiac Rehab Program. If interested, patient will need to complete follow up appt. Once completed, patient will be contacted for scheduling upon review by the RN Navigator.

## 2019-06-20 ENCOUNTER — Telehealth: Payer: Self-pay | Admitting: *Deleted

## 2019-06-20 ENCOUNTER — Other Ambulatory Visit: Payer: Self-pay

## 2019-06-20 ENCOUNTER — Ambulatory Visit: Payer: Medicare HMO | Admitting: Internal Medicine

## 2019-06-20 ENCOUNTER — Emergency Department (HOSPITAL_COMMUNITY)
Admission: EM | Admit: 2019-06-20 | Discharge: 2019-06-20 | Disposition: A | Discharge status: Home / Self Care | Payer: Medicare HMO | Attending: Emergency Medicine | Admitting: Emergency Medicine

## 2019-06-20 ENCOUNTER — Encounter (HOSPITAL_COMMUNITY): Payer: Self-pay

## 2019-06-20 ENCOUNTER — Telehealth: Payer: Self-pay | Admitting: Internal Medicine

## 2019-06-20 DIAGNOSIS — M7989 Other specified soft tissue disorders: Secondary | ICD-10-CM | POA: Diagnosis not present

## 2019-06-20 DIAGNOSIS — R2241 Localized swelling, mass and lump, right lower limb: Secondary | ICD-10-CM | POA: Insufficient documentation

## 2019-06-20 DIAGNOSIS — Z79899 Other long term (current) drug therapy: Secondary | ICD-10-CM | POA: Diagnosis not present

## 2019-06-20 DIAGNOSIS — F17210 Nicotine dependence, cigarettes, uncomplicated: Secondary | ICD-10-CM | POA: Diagnosis not present

## 2019-06-20 DIAGNOSIS — J449 Chronic obstructive pulmonary disease, unspecified: Secondary | ICD-10-CM | POA: Diagnosis not present

## 2019-06-20 DIAGNOSIS — I10 Essential (primary) hypertension: Secondary | ICD-10-CM | POA: Diagnosis not present

## 2019-06-20 LAB — CBC
HCT: 44.3 % (ref 36.0–46.0)
Hemoglobin: 14.4 g/dL (ref 12.0–15.0)
MCH: 31.2 pg (ref 26.0–34.0)
MCHC: 32.5 g/dL (ref 30.0–36.0)
MCV: 95.9 fL (ref 80.0–100.0)
Platelets: 300 10*3/uL (ref 150–400)
RBC: 4.62 MIL/uL (ref 3.87–5.11)
RDW: 13.3 % (ref 11.5–15.5)
WBC: 19.9 10*3/uL — ABNORMAL HIGH (ref 4.0–10.5)
nRBC: 0 % (ref 0.0–0.2)

## 2019-06-20 LAB — BASIC METABOLIC PANEL
Anion gap: 9 (ref 5–15)
BUN: 20 mg/dL (ref 8–23)
CO2: 28 mmol/L (ref 22–32)
Calcium: 8.6 mg/dL — ABNORMAL LOW (ref 8.9–10.3)
Chloride: 104 mmol/L (ref 98–111)
Creatinine, Ser: 0.97 mg/dL (ref 0.44–1.00)
GFR calc Af Amer: >60 mL/min (ref >60)
GFR calc non Af Amer: 58 mL/min — ABNORMAL LOW (ref >60)
Glucose, Bld: 112 mg/dL — ABNORMAL HIGH (ref 70–99)
Potassium: 4.1 mmol/L (ref 3.5–5.1)
Sodium: 141 mmol/L (ref 135–145)

## 2019-06-20 LAB — D-DIMER, QUANTITATIVE: D-Dimer, Quant: 0.92 ug/mL-FEU — ABNORMAL HIGH (ref 0.00–0.50)

## 2019-06-20 MED ORDER — ENOXAPARIN SODIUM 80 MG/0.8ML ~~LOC~~ SOLN
1.0000 mg/kg | Freq: Once | SUBCUTANEOUS | Status: AC
  Administered 2019-06-20: 70 mg via SUBCUTANEOUS
  Filled 2019-06-20 (×3): qty 0.7

## 2019-06-20 NOTE — Telephone Encounter (Signed)
Pt calling complaining of right foot swelling, pt woke up this morning with this after being discharged from the hospital on 06/15/19, pt will follow-up with you on Monday. Is there anything she should be doing right now?

## 2019-06-20 NOTE — Telephone Encounter (Signed)
Can elevate foot, if leg or foot becomes painful, red and swollen would recommend immediate medical attention

## 2019-06-20 NOTE — Telephone Encounter (Signed)
Pt was admitted to hospital 3/8-3/14 due to copd exacerbation as well as acute respiratory failure with hypoxemia.  Called and spoke with pt who stated she woke up today 3/19 having a funny feeling in feet but stated her right foot is more swollen than left foot and ankles are also swollen.  Pt states she has been elevating feet on pillows which has helped some with swelling but not much. Asked pt if feet/ankles were warm to the touch or painful and she said that it is not. Pt just stated the swelling is only thing that is bothersome with it.  Pt wants to know what is recommended to help with this. Dr. Melvyn Novas, please advise. Thanks!

## 2019-06-20 NOTE — Telephone Encounter (Signed)
Spoke with patient and advised results   

## 2019-06-20 NOTE — ED Provider Notes (Signed)
[place] DEPT Provider Note   CSN: [id number] Arrival date & time: 06/20/19  1858     History Chief Complaint  Patient presents with  . Leg Swelling    right lower leg    Gabrielle White is a 74 y.o. female.  The history is provided by the patient and medical records. No language interpreter was used.     74 year old female with hx of emphysema, tobacco abuse, depression, HTN, HLD presenting to the ER per recommendation of PCP to r/o DVT.  Pt recently hospitalized for 1 week due to SOB and was discharged home a few days ago with home O2 due to acute respiratory failure with hypoxia.  This AM she noticed swelling about her R foot and leg.  No significant pain but swelling persists.  She reached out to her PCP who recommend coming to the ER for DVT study.  She denies prior DVT.  No recent injury, but does endorsed prolonged bedrest during her hospitalization.  She denies increase SOB or worsening cough, no hemoptysis.  No fever or chills, no rash.  Currently on home O2 at 3L.    Past Medical History:  Diagnosis Date  . Chronic airway obstruction, not elsewhere classified   . Depressive disorder   . Depressive disorder, not elsewhere classified   . Emphysema   . Enthesopathy of ankle and tarsus, unspecified   . HTN (hypertension)   . Hyperlipidemia   . Hypertension   . Hypopotassemia   . Leukocytosis   . Migraine, unspecified, without mention of intractable migraine without mention of status migrainosus   . Nonspecific (abnormal) findings on radiological and other examination of skull and head   . Osteoarthrosis, unspecified whether generalized or localized, unspecified site   . Other emphysema ([place])   . Other specified disease of white blood cells   . Palpitations   . RMSF [facility] spotted fever) 08/23/2015   Positive IgM. Treated with doxycycline.   . Tobacco abuse     Patient Active Problem List   Diagnosis Date Noted  . COPD with  acute exacerbation ([place]) 06/10/2019  . Acute respiratory failure with hypoxia and hypercapnia ([place]) 06/10/2019  . COPD exacerbation ([place]) 06/10/2019  . Acute respiratory failure with hypoxemia ([place]) 06/09/2019  . Polycythemia 06/09/2019  . Atherosclerosis of aorta ([place]) 11/12/2018  . Emphysema lung ([place]) 11/12/2018  . Rhinitis, chronic 03/12/2018  . Abnormal CXR 11/21/2016  . Ganglion cyst of wrist 05/23/2016  . DOE (dyspnea on exertion) 03/21/2016  . Absence attack ([place]) 10/06/2015  . RMSF [facility] spotted fever) 08/23/2015  . Depression 10/28/2014  . Balance problem 07/07/2014  . Claustrophobia 12/17/2013  . Urinary incontinence 10/29/2013  . Chest pain, unspecified 10/07/2013  . Essential hypertension 10/07/2013  . Anxiety state 01/08/2013  . Chronic obstructive airway disease with asthma ([place]) 11/18/2012  . Palpitations 10/22/2012  . Pain in finger of left hand 08/21/2012  . Hyperlipidemia 08/20/2012  . Hypothyroidism 08/20/2012  . COPD GOLD II/ still smoking  02/03/2011  . Chronic cough 02/03/2011  . Hyperglycemia 11/18/2010  . Cigarette smoker 11/10/2010    Past Surgical History:  Procedure Laterality Date  . CESAREAN SECTION    . COLONOSCOPY  08/14/2008   DrMarland Kitchen Delfin Edis     OB History   No obstetric history on file.     Family History  Problem Relation Age of Onset  . Alzheimer's disease Father   . Heart disease Father   . Diabetes Father   .  Skin cancer Father   . Heart disease Mother   . Breast cancer Mother     Social History   Tobacco Use  . Smoking status: Current Every Day Smoker    Packs/day: 1.00    Years: 28.00    Pack years: 28.00    Types: Cigarettes  . Smokeless tobacco: Never Used  . Tobacco comment: .5ppd as of 03/17/19  Substance Use Topics  . Alcohol use: No    Alcohol/week: 0.0 standard drinks  . Drug use: No    Home Medications Prior to Admission medications   Medication Sig Start Date End Date Taking? Authorizing  Provider  acetaminophen (TYLENOL) 500 MG tablet Take 500 mg by mouth every 6 (six) hours as needed for mild pain or headache.    [provider]  albuterol (PROAIR HFA) 108 (90 Base) MCG/ACT inhaler INHALE 1 TO 2 PUFF INTO THE LUNGS EVERY 6 HOURS AS NEEDED FOR WHEEZING OR SHORTNESS OF BREATH Patient taking differently: Inhale 1-2 puffs into the lungs every 6 (six) hours as needed for wheezing or shortness of breath.  04/07/19   Tanda Rockers, MD  ALPRAZolam Duanne Moron) 0.5 MG tablet TAKE 1 TABLET BY MOUTH TWICE DAILY AS NEEDED FOR NERVOUSNESS/SLEEP Patient taking differently: Take 0.5 mg by mouth 2 (two) times daily as needed for anxiety or sleep.  05/06/19   Lauree Chandler, NP  atorvastatin (LIPITOR) 40 MG tablet TAKE 1 TABLET BY MOUTH DAILY FOR HIGH CHOLESTEROL Patient taking differently: Take 40 mg by mouth daily at 6 PM.  11/29/18   Lauree Chandler, NP  benzonatate (TESSALON) 100 MG capsule Take 1 capsule (100 mg total) by mouth 2 (two) times daily as needed for cough. 06/15/19   Pokhrel, Corrie Mckusick, MD  Budeson-Glycopyrrol-Formoterol (BREZTRI AEROSPHERE) 160-9-4.8 MCG/ACT AERO Inhale 2 puffs into the lungs 2 (two) times daily. 03/17/19   Tanda Rockers, MD  Cholecalciferol (VITAMIN D3) 1000 UNITS CAPS Take 1 capsule by mouth daily.      [provider]  guaiFENesin-dextromethorphan (ROBITUSSIN DM) 100-10 MG/5ML syrup Take 5 mLs by mouth every 4 (four) hours as needed for cough. 06/15/19   Pokhrel, Corrie Mckusick, MD  levothyroxine (SYNTHROID) 125 MCG tablet Take 1 tablet (125 mcg total) by mouth daily. 03/04/19   Lauree Chandler, NP  losartan (COZAAR) 50 MG tablet TAKE 1 TABLET(50 MG) BY MOUTH DAILY Patient taking differently: Take 50 mg by mouth daily.  03/03/19   Lauree Chandler, NP  nicotine (NICODERM CQ - DOSED IN MG/24 HOURS) 14 mg/24hr patch Place 1 patch (14 mg total) onto the skin daily. 06/16/19   Pokhrel, Corrie Mckusick, MD  pantoprazole (PROTONIX) 40 MG tablet Take 1 tablet (40 mg  total) by mouth daily before breakfast. 06/16/19   Pokhrel, Corrie Mckusick, MD  predniSONE (DELTASONE) 10 MG tablet Take 4 tablets (40 mg total) by mouth daily with breakfast for 2 days, THEN 3 tablets (30 mg total) daily with breakfast for 2 days, THEN 2 tablets (20 mg total) daily with breakfast for 2 days, THEN 1 tablet (10 mg total) daily with breakfast for 1 day. 06/15/19 06/22/19  Pokhrel, Corrie Mckusick, MD  sertraline (ZOLOFT) 100 MG tablet TAKE 1 TABLET BY MOUTH DAILY Patient taking differently: Take 100 mg by mouth daily.  11/29/18   Lauree Chandler, NP    Allergies    Cafergot, Codeine, Fenoprofen calcium, and Nalfon [fenoprofen]  Review of Systems   Review of Systems  All other systems reviewed and are negative.  Physical Exam Updated Vital Signs BP (!) 146/78 (BP Location: Left Arm)   Pulse 82   Temp 97.8 F (36.6 C) (Oral)   Resp 20   Ht 5\' 5"  (1.651 m)   Wt 71.2 kg   SpO2 94%   BMI 26.13 kg/m   Physical Exam Vitals and nursing note reviewed.  Constitutional:      General: She is not in acute distress.    Appearance: She is well-developed.     Comments: Chronically ill female in NAD.  Wearing supplemental O2 at 3L  HENT:     Head: Atraumatic.  Eyes:     Conjunctiva/sclera: Conjunctivae normal.  Cardiovascular:     Rate and Rhythm: Normal rate and regular rhythm.     Pulses: Normal pulses.     Heart sounds: Normal heart sounds.  Pulmonary:     Effort: Pulmonary effort is normal.     Breath sounds: Normal breath sounds.     Comments: Decreased breath sounds without overt wheezes, rales or rhonchi.  Abdominal:     Palpations: Abdomen is soft.     Tenderness: There is no abdominal tenderness.  Musculoskeletal:        General: Swelling (RLE: 1+ pitting edema extended towards ankle, no calf tenderness, DP pulse intact, brisk cap refill, soft leg compartment.) present.     Cervical back: Neck supple.  Skin:    Findings: No rash.  Neurological:     Mental Status: She is  alert.  Psychiatric:        Mood and Affect: Mood normal.     ED Results / Procedures / Treatments   Labs (all labs ordered are listed, but only abnormal results are displayed) Labs Reviewed  BASIC METABOLIC PANEL - Abnormal; Notable for the following components:      Result Value   Glucose, Bld 112 (*)    Calcium 8.6 (*)    GFR calc non Af Amer 58 (*)    All other components within normal limits  CBC - Abnormal; Notable for the following components:   WBC 19.9 (*)    All other components within normal limits  D-DIMER, QUANTITATIVE (NOT AT [facility]) - Abnormal; Notable for the following components:   D-Dimer, Quant 0.92 (*)    All other components within normal limits    EKG None  Radiology No results found.  Procedures Procedures (including critical care time)  Medications Ordered in ED Medications  enoxaparin (LOVENOX) injection 70 mg (has no administration in time range)    ED Course  I have reviewed the triage vital signs and the nursing notes.  Pertinent labs & imaging results that were available during my care of the patient were reviewed by me and considered in my medical decision making (see chart for details).    MDM Rules/Calculators/A&P                      BP 136/72   Pulse 75   Temp 98 F (36.7 C) (Oral)   Resp 17   Ht 5\' 5"  (1.651 m)   Wt 71.2 kg   SpO2 90%   BMI 26.13 kg/m   Final Clinical Impression(s) / ED Diagnoses Final diagnoses:  Right leg swelling    Rx / DC Orders ED Discharge Orders         Ordered    LE VENOUS     06/20/19 2038         8:27 PM Pt here for RLE  edema x 1 day.  Recent hospitalization for respiratory failure, thus contributing to prolonged bed rest and increase risk of DVT.  Since [place] tech not available at this time, will give Lovenox and have pt return tomorrow for DVT study.  Care discussed with Dr. Gilford Raid.  Pt agrees with plan.   8:36 PM Elevated D-dimer of .0.92, likely due to potential DVT.  WBC is  19.9, likely due to recent prednisone use, doubt infection.  Normal renal function, will give Lovenox.  Pt will return tomorrow for doppler study.    Domenic Moras, PA-C 06/20/19 2040    Isla Pence, MD 06/20/19 2124

## 2019-06-20 NOTE — ED Notes (Signed)
Pt didn't feel like she was getting enough oxygen from her nasal cannula. Her oxygen tank was found to have adequate oxygen, and her vital signs were assessed. She mentioned that her doctor wanted her right leg elevated to alleviate blood clots so her right leg was propped up on a table.

## 2019-06-20 NOTE — ED Triage Notes (Signed)
Arrived POV from home sent by PCP to r/o DVT of right leg. Patient reports she noticed the swelling this morning. Denies any leg pain.

## 2019-06-20 NOTE — Discharge Instructions (Signed)
Please return tomorrow in the morning and request venous doppler study of your right lower leg to rule out DVT (blood clot)

## 2019-06-20 NOTE — Telephone Encounter (Signed)
Can't eval over the phone - will need to have eval in ER for DVT

## 2019-06-20 NOTE — Telephone Encounter (Signed)
Called and spoke with pt letting her know the info stated by MW that she needed to go to ER for evaluation for DVT. Pt verbalized understanding. Nothing further needed.

## 2019-06-21 ENCOUNTER — Ambulatory Visit (HOSPITAL_COMMUNITY)
Admission: RE | Admit: 2019-06-21 | Discharge: 2019-06-21 | Disposition: A | Discharge status: Home / Self Care | Payer: Medicare HMO | Source: Ambulatory Visit | Attending: Emergency Medicine | Admitting: Emergency Medicine

## 2019-06-21 DIAGNOSIS — J449 Chronic obstructive pulmonary disease, unspecified: Secondary | ICD-10-CM | POA: Diagnosis not present

## 2019-06-21 DIAGNOSIS — M7989 Other specified soft tissue disorders: Secondary | ICD-10-CM | POA: Diagnosis not present

## 2019-06-21 NOTE — Progress Notes (Signed)
Right lower extremity venous duplex exam completed.  Preliminary results can be found under CV proc under chart review.  06/21/2019 11:43 AM  Texie Tupou, K., RDMS, RVT

## 2019-06-23 ENCOUNTER — Encounter: Payer: Self-pay | Admitting: Nurse Practitioner

## 2019-06-23 ENCOUNTER — Other Ambulatory Visit: Payer: Self-pay

## 2019-06-23 ENCOUNTER — Ambulatory Visit (INDEPENDENT_AMBULATORY_CARE_PROVIDER_SITE_OTHER): Payer: Medicare HMO | Admitting: Nurse Practitioner

## 2019-06-23 VITALS — BP 138/84 | HR 76 | Temp 97.7°F | Resp 16 | Ht 65.0 in | Wt 165.0 lb

## 2019-06-23 DIAGNOSIS — R600 Localized edema: Secondary | ICD-10-CM | POA: Diagnosis not present

## 2019-06-23 DIAGNOSIS — I700 Atherosclerosis of aorta: Secondary | ICD-10-CM

## 2019-06-23 DIAGNOSIS — J9602 Acute respiratory failure with hypercapnia: Secondary | ICD-10-CM

## 2019-06-23 DIAGNOSIS — J310 Chronic rhinitis: Secondary | ICD-10-CM | POA: Diagnosis not present

## 2019-06-23 DIAGNOSIS — J441 Chronic obstructive pulmonary disease with (acute) exacerbation: Secondary | ICD-10-CM | POA: Diagnosis not present

## 2019-06-23 DIAGNOSIS — J9601 Acute respiratory failure with hypoxia: Secondary | ICD-10-CM | POA: Diagnosis not present

## 2019-06-23 DIAGNOSIS — D751 Secondary polycythemia: Secondary | ICD-10-CM | POA: Diagnosis not present

## 2019-06-23 NOTE — Patient Instructions (Addendum)
Make appt for next week for lab work  To start Claritin 10 mg by mouth daily  To start Flonase to both nares daily To use nasal wash daily   To use heat/ice ~30 mins 2-3 times daily Then apply muscle rub to leg  Continue to elevated legs To apply compression hose daily (take off at bedtime)

## 2019-06-23 NOTE — Progress Notes (Signed)
Careteam: Patient Care Team: Lauree Chandler, NP as PCP - General (Geriatric Medicine) Jon Billings, RN as [place] Management  PLACE OF SERVICE:  [place]  Advanced Directive information    Allergies  Allergen Reactions  . Cafergot   . Codeine   . Fenoprofen Calcium   . Nalfon [Fenoprofen]     Chief Complaint  Patient presents with  . [facility] Admission 06/09/2019-06/15/2019  . Health Maintenance    Discuss the need for Colonoscopy.     HPI: Patient is a 74 y.o. female for TOC visit.   Pt with hx of COPD emphysema, anxiety, depression, hypertension, hyperlipidemia, tobacco abuse who went to the ED after progressely worsening dyspnea for 4-5 days. In the ED she was given bronchodilator and 60 mg of prednisone  She became hypoxic and tachypneic with ambulation so was admitted. Thought to be due to viral illness with ongoing smoking. Noted to have worsen COPD. Pulmonary rehab was order with prednisone taper on discharge and she was ordered 3L home O2. Overall breathing is okay.  Has not heard anything about pulmonary rehab.  Son has been helping her.  Has quit smoking. "after all this"   After she was discharged home noted worsen swelling to right foot she went back to ED and DVT was ruled out. Continues to have swelling but overall much better.  No pain.  Reports nose is scabby- reports she feel down and hit her nose on the floor.  Feeling that her head is stopped up. Has ENT that lives near her and wants to check her tubes.  Feels like she has had a head cold for "a few months"  Review of Systems:  Review of Systems  Constitutional: Negative for chills, fever and weight loss.  HENT: Negative for tinnitus.   Respiratory: Positive for cough, shortness of breath and wheezing. Negative for sputum production.        Chronic cough, shortness of breath and wheezing, overall improved since hospitalization.   Cardiovascular:  Negative for chest pain, palpitations and leg swelling.  Gastrointestinal: Negative for abdominal pain, constipation, diarrhea and heartburn.  Genitourinary: Negative for dysuria, frequency and urgency.  Musculoskeletal: Negative for back pain, falls, joint pain and myalgias.  Skin: Negative.   Neurological: Negative for dizziness and headaches.  Psychiatric/Behavioral: Negative for depression and memory loss. The patient does not have insomnia.     Past Medical History:  Diagnosis Date  . Chronic airway obstruction, not elsewhere classified   . Depressive disorder   . Depressive disorder, not elsewhere classified   . Emphysema   . Enthesopathy of ankle and tarsus, unspecified   . HTN (hypertension)   . Hyperlipidemia   . Hypertension   . Hypopotassemia   . Leukocytosis   . Migraine, unspecified, without mention of intractable migraine without mention of status migrainosus   . Nonspecific (abnormal) findings on radiological and other examination of skull and head   . Osteoarthrosis, unspecified whether generalized or localized, unspecified site   . Other emphysema ([place])   . Other specified disease of white blood cells   . Palpitations   . RMSF [facility] spotted fever) 08/23/2015   Positive IgM. Treated with doxycycline.   . Tobacco abuse    Past Surgical History:  Procedure Laterality Date  . CESAREAN SECTION    . COLONOSCOPY  08/14/2008   Dr.. Delfin Edis   Social History:   reports that she has been  smoking cigarettes. She has a 28.00 pack-year smoking history. She has never used smokeless tobacco. She reports that she does not drink alcohol or use drugs.  Family History  Problem Relation Age of Onset  . Alzheimer's disease Father   . Heart disease Father   . Diabetes Father   . Skin cancer Father   . Heart disease Mother   . Breast cancer Mother     Medications: Patient's Medications  New Prescriptions   No medications on file  Previous Medications    ACETAMINOPHEN (TYLENOL) 500 MG TABLET    Take 500 mg by mouth every 6 (six) hours as needed for mild pain or headache.   ALBUTEROL (PROAIR HFA) 108 (90 BASE) MCG/ACT INHALER    INHALE 1 TO 2 PUFF INTO THE LUNGS EVERY 6 HOURS AS NEEDED FOR WHEEZING OR SHORTNESS OF BREATH   ALPRAZOLAM (XANAX) 0.5 MG TABLET    TAKE 1 TABLET BY MOUTH TWICE DAILY AS NEEDED FOR NERVOUSNESS/SLEEP   ATORVASTATIN (LIPITOR) 40 MG TABLET    TAKE 1 TABLET BY MOUTH DAILY FOR HIGH CHOLESTEROL   BENZONATATE (TESSALON) 100 MG CAPSULE    Take 1 capsule (100 mg total) by mouth 2 (two) times daily as needed for cough.   BUDESON-GLYCOPYRROL-FORMOTEROL (BREZTRI AEROSPHERE) 160-9-4.8 MCG/ACT AERO    Inhale 2 puffs into the lungs 2 (two) times daily.   CHOLECALCIFEROL (VITAMIN D3) 1000 UNITS CAPS    Take 1 capsule by mouth daily.     GUAIFENESIN-DEXTROMETHORPHAN (ROBITUSSIN DM) 100-10 MG/5ML SYRUP    Take 5 mLs by mouth every 4 (four) hours as needed for cough.   LEVOTHYROXINE (SYNTHROID) 125 MCG TABLET    Take 1 tablet (125 mcg total) by mouth daily.   LOSARTAN (COZAAR) 50 MG TABLET    TAKE 1 TABLET(50 MG) BY MOUTH DAILY   PANTOPRAZOLE (PROTONIX) 40 MG TABLET    Take 1 tablet (40 mg total) by mouth daily before breakfast.  Modified Medications   No medications on file  Discontinued Medications   NICOTINE (NICODERM CQ - DOSED IN MG/24 HOURS) 14 MG/24HR PATCH    Place 1 patch (14 mg total) onto the skin daily.   SERTRALINE (ZOLOFT) 100 MG TABLET    TAKE 1 TABLET BY MOUTH DAILY    Physical Exam:  Vitals:   06/23/19 1610  BP: 138/84  Pulse: 76  Resp: 16  Temp: 97.7 F (36.5 C)  SpO2: 97%  Weight: 165 lb (74.8 kg)  Height: '5\' 5"'  (1.651 m)   Body mass index is 27.46 kg/m. Wt Readings from Last 3 Encounters:  06/23/19 165 lb (74.8 kg)  06/20/19 157 lb (71.2 kg)  06/09/19 157 lb (71.2 kg)    Physical Exam Constitutional:      General: She is not in acute distress.    Appearance: She is well-developed. She is not  diaphoretic.  HENT:     Head: Normocephalic and atraumatic.     Right Ear: Tympanic membrane, ear canal and external ear normal. There is no impacted cerumen.     Left Ear: Tympanic membrane, ear canal and external ear normal. There is no impacted cerumen.     Mouth/Throat:     Pharynx: No oropharyngeal exudate.  Eyes:     Conjunctiva/sclera: Conjunctivae normal.     Pupils: Pupils are equal, round, and reactive to light.  Cardiovascular:     Rate and Rhythm: Normal rate and regular rhythm.     Heart sounds: Normal heart sounds.  Pulmonary:  Effort: Pulmonary effort is normal.     Breath sounds: Normal breath sounds.  Abdominal:     General: Bowel sounds are normal.     Palpations: Abdomen is soft.  Musculoskeletal:        General: No tenderness.     Cervical back: Normal range of motion and neck supple.  Skin:    General: Skin is warm and dry.  Neurological:     Mental Status: She is alert and oriented to person, place, and time.     Labs reviewed: Basic Metabolic Panel: Recent Labs    08/19/18 1131 11/22/18 1521 11/22/18 1521 06/09/19 1105 06/10/19 0805 06/12/19 0444 06/14/19 0536 06/20/19 1957  NA  --  141   < > 140   < > 142 139 141  K  --  4.3   < > 4.0   < > 4.1 4.1 4.1  CL  --  102   < > 103   < > 104 101 104  CO2  --  29   < > 30   < > '28 30 28  ' GLUCOSE  --  90   < > 121*   < > 100* 142* 112*  BUN  --  11   < > 15   < > '19 23 20  ' CREATININE  --  0.79  --  0.76   < > 0.64 0.61 0.97  CALCIUM  --  9.6   < > 9.4   < > 8.8* 8.7* 8.6*  MG  --   --   --  2.1  --  2.2 2.3  --   PHOS  --   --   --  4.0  --   --   --   --   TSH 0.77 3.08  --   --   --   --   --   --    < > = values in this interval not displayed.   Liver Function Tests: Recent Labs    11/22/18 1521 06/09/19 1105  AST 12 17  ALT 11 21  ALKPHOS  --  118  BILITOT 0.5 0.8  PROT 6.7 7.5  ALBUMIN  --  4.2   No results for input(s): LIPASE, AMYLASE in the last 8760 hours. No results for  input(s): AMMONIA in the last 8760 hours. CBC: Recent Labs    11/22/18 1521 11/22/18 1521 06/09/19 1105 06/10/19 0805 06/12/19 0444 06/14/19 0536 06/20/19 1957  WBC 9.6   < > 11.3*   < > 12.8* 11.3* 19.9*  NEUTROABS 6,230  --  7.6  --   --   --   --   HGB 15.4   < > 15.6*   < > 14.9 13.9 14.4  HCT 46.2*   < > 49.8*   < > 48.8* 44.0 44.3  MCV 92.6   < > 97.5   < > 100.2* 97.1 95.9  PLT 283   < > 322   < > 317 286 300   < > = values in this interval not displayed.   Lipid Panel: Recent Labs    11/22/18 1521  CHOL 171  HDL 55  LDLCALC 93  TRIG 136  CHOLHDL 3.1   TSH: Recent Labs    08/19/18 1131 11/22/18 1521  TSH 0.77 3.08   A1C: Lab Results  Component Value Date   HGBA1C 5.9 (H) 06/17/2013     Assessment/Plan 1. COPD with acute exacerbation (HCC) Stable on current inhalers,  doing well after hospitalization and has quit smoking.  - BMP with eGFR([facility]); Future - CBC with Differential/Platelet; Future  2. Acute respiratory failure with hypoxia and hypercapnia (HCC) -s/p recent hospitalization with Nebulizer and steroids. Has QUIT smoking! Doing much better at this time. Continues on 3L o2  3. Polycythemia -noted during hospitalization, will follow up CBC  4. Atherosclerosis of aorta ([place]) Noted on imagining. Continues on statin  5. Rhinitis, chronic -To start Claritin 10 mg by mouth daily  To start Flonase to both nares daily To use nasal wash daily   6. LE edema -doppler ruled out DVT  Improved at this time Encouraged compression hose Elevated legs when sitting Proper protein intake  Next appt: 08/27/2019 as scheduled.  Carlos American. [place], [facility] [phone]

## 2019-06-23 NOTE — Patient Outreach (Signed)
[facility]) Care Management  06/23/2019  KARLYNN SHUGARS 05-Dec-1945 [id number]   EMMI- General Discharge RED ON EMMI ALERT Day # 4 Date: 06/20/19 Red Alert Reason:  Know who to call about changes in condition? No  Sad/hopeless/anxious/empty? Yes  Other questions/problems? Yes   Outreach attempt:spoke with patient. She reports she is doing ok.  Shares that she went to the ED for a swollen foot.  She states she did not have any blood clots but it is still some swollen. She states that she is elevating foot as advised. Patient states she is to see PCP today and will discuss.  Addressed red alerts.  Patient reports that she does not even remember the call. She denies any issues.  Discussed further nurse follow up calls with patient.  She declines saying that she is doing ok.  Did agree to letter being sent for future reference. Discussed COPD and exacerbation and when to seek assistance. She verbalized understanding and is appreciative of call.     Plan: RN CM will send letter and close case.    Jone Baseman, RN, MSN [facility] Care Management Care Management Coordinator Direct Line [phone] Toll Free: [phone]  Fax: [phone]

## 2019-06-24 ENCOUNTER — Encounter: Payer: Self-pay | Admitting: Adult Health

## 2019-06-24 ENCOUNTER — Ambulatory Visit: Payer: Medicare HMO | Admitting: Adult Health

## 2019-06-24 DIAGNOSIS — J449 Chronic obstructive pulmonary disease, unspecified: Secondary | ICD-10-CM

## 2019-06-24 DIAGNOSIS — J310 Chronic rhinitis: Secondary | ICD-10-CM

## 2019-06-24 DIAGNOSIS — J9611 Chronic respiratory failure with hypoxia: Secondary | ICD-10-CM | POA: Insufficient documentation

## 2019-06-24 NOTE — Assessment & Plan Note (Signed)
Cont on O2 , decrease to O2 2l/m  Goal is to have O2 sats >88-90%.

## 2019-06-24 NOTE — Progress Notes (Signed)
@Patient  ID: Gabrielle White, female    DOB: 09-07-1945, 74 y.o.   MRN: [id number]  Chief Complaint  Patient presents with  . Follow-up    COPD     Referring provider: Sandrea Hughs, NP  HPI: 74 year old female former smoker March 2021 followed for moderate COPD and oxygen dependent respiratory failure  TEST/EVENTS :   PFTs  11/18/10  FEV1  2.0 (95%) ratio 73 and no change p saba and DLCO 75 corrects to 100% -PFT's  03/08/2015  FEV1 1.85 (78 % ) ratio 73  p 5 % improvement from saba with DLCO  63 % corrects to 72 % for alv volume and erv 17   - added flutter 06/07/2015  - PFT's  08/16/2016  FEV1 1.42 (61 % ) ratio 71  p 8 % improvement FEV1 (and 20% FVC) from saba p symb 160  prior to study with DLCO  61/58 % corrects to 78 % for alv volume    - Allergy profile 08/16/2016 >  Eos 0.5 /  IgE  99 RAST neg -  ADDED prednisone x 6 days cycles 11/20/2016  - 05/23/2017  After extensive coaching HFA effectiveness =    90% - PFT's  11/28/2017  FEV1 1.27 (57 % ) ratio 63  p 6 % improvement from saba p symb x 160 x 2  prior to study with DLCO  61 % corrects to 81  % for alv volume   - alpha one AT screen 06/10/2018    MM level 141   - 01/24/2019    try Breztri 2bid and pred x 6 days > much better 02/21/2019  but can't afford so changed to bevespi plus prn pred for flares   06/24/2019 Follow up : COPD , O2 RF  Presents for a follow-up visit for COPD.  Patient had a recent COPD exacerbation was hospitalized treated with  steroids along with nebulized bronchodilators.  She did require oxygen at discharge.  At 2 L Since discharge patient is feeling better. Decreased cough and wheezing . Has quit smoking since admission , congratulations on cessation.  She remains on BREZtri Twice daily . Currently on Oxygen 3l/. O2 sats adequate on 2l/m O2 at rest and walking , O2 >90%.   Low-dose CT screening November 12, 2018 lung RADS 1 recommendations for annual screening  Has received Covid vaccine #1 . Needs to  schedule #2 , missed due to hospitalization .  Set up online appointments for 06/27/19 .   Allergies  Allergen Reactions  . Cafergot   . Codeine   . Fenoprofen Calcium   . Nalfon [Fenoprofen]     Immunization History  Administered Date(s) Administered  . Fluad Quad(high Dose 65+) 11/22/2018  . Influenza Split 01/01/2013  . Influenza,inj,Quad PF,6+ Mos 01/08/2013, 12/17/2013, 03/10/2015, 03/06/2016, 05/06/2018  . Influenza,inj,quad, With Preservative 02/15/2017  . Influenza-Unspecified 02/20/2017  . PFIZER SARS-COV-2 Vaccination 05/21/2019  . Pneumococcal Conjugate-13 10/06/2015  . Pneumococcal Polysaccharide-23 04/11/2011  . Td 04/04/2004, 11/01/2018  . Zoster 01/12/2014    Past Medical History:  Diagnosis Date  . Chronic airway obstruction, not elsewhere classified   . Depressive disorder   . Depressive disorder, not elsewhere classified   . Emphysema   . Enthesopathy of ankle and tarsus, unspecified   . HTN (hypertension)   . Hyperlipidemia   . Hypertension   . Hypopotassemia   . Leukocytosis   . Migraine, unspecified, without mention of intractable migraine without mention of status migrainosus   .  Nonspecific (abnormal) findings on radiological and other examination of skull and head   . Osteoarthrosis, unspecified whether generalized or localized, unspecified site   . Other emphysema ([place])   . Other specified disease of white blood cells   . Palpitations   . RMSF [facility] spotted fever) 08/23/2015   Positive IgM. Treated with doxycycline.   . Tobacco abuse     Tobacco History: Social History   Tobacco Use  Smoking Status Former Smoker  . Types: Cigarettes  Smokeless Tobacco Never Used  Tobacco Comment   Stopped 3 weeks ago.   Counseling given: Not Answered Comment: Stopped 3 weeks ago.   Outpatient Medications Prior to Visit  Medication Sig Dispense Refill  . acetaminophen (TYLENOL) 500 MG tablet Take 500 mg by mouth every 6 (six) hours as  needed for mild pain or headache.    . albuterol (VENTOLIN HFA) 108 (90 Base) MCG/ACT inhaler Inhale 2 puffs into the lungs every 6 (six) hours as needed for wheezing or shortness of breath.    . ALPRAZolam (XANAX) 0.5 MG tablet Take 0.5 mg by mouth 2 (two) times daily as needed for anxiety.    Marland Kitchen atorvastatin (LIPITOR) 40 MG tablet Take 40 mg by mouth daily.    . benzonatate (TESSALON) 100 MG capsule Take 1 capsule (100 mg total) by mouth 2 (two) times daily as needed for cough. 20 capsule 0  . Budeson-Glycopyrrol-Formoterol (BREZTRI AEROSPHERE) 160-9-4.8 MCG/ACT AERO Inhale 2 puffs into the lungs 2 (two) times daily. 5.9 g 11  . Cholecalciferol (VITAMIN D3) 1000 UNITS CAPS Take 1 capsule by mouth daily.      Marland Kitchen levothyroxine (SYNTHROID) 125 MCG tablet Take 1 tablet (125 mcg total) by mouth daily. 30 tablet 5  . losartan (COZAAR) 50 MG tablet Take 50 mg by mouth daily.    . pantoprazole (PROTONIX) 40 MG tablet Take 1 tablet (40 mg total) by mouth daily before breakfast. 30 tablet 0  . sertraline (ZOLOFT) 100 MG tablet Take 100 mg by mouth at bedtime.     No facility-administered medications prior to visit.     Review of Systems:   Constitutional:   No  weight loss, night sweats,  Fevers, chills,  +fatigue, or  lassitude.  HEENT:   No headaches,  Difficulty swallowing,  Tooth/dental problems, or  Sore throat,                No sneezing, itching, ear ache, + nasal congestion, post nasal drip,   CV:  No chest pain,  Orthopnea, PND, swelling in lower extremities, anasarca, dizziness, palpitations, syncope.   GI  No heartburn, indigestion, abdominal pain, nausea, vomiting, diarrhea, change in bowel habits, loss of appetite, bloody stools.   Resp:    No chest wall deformity  Skin: no rash or lesions.  GU: no dysuria, change in color of urine, no urgency or frequency.  No flank pain, no hematuria   MS:  No joint pain or swelling.  No decreased range of motion.  No back  pain.    Physical Exam  BP 118/82 (BP Location: Left Arm, Patient Position: Sitting, Cuff Size: Normal)   Pulse 88   Temp 97.8 F (36.6 C) (Temporal)   Ht 5\' 5"  (1.651 m)   Wt 164 lb (74.4 kg)   SpO2 95% Comment: 02 3l/min  BMI 27.29 kg/m   GEN: A/Ox3; pleasant , NAD, elderly on o2    HEENT:  [place]/AT,  EACs-clear, TMs-wnl, NOSE-clear, THROAT-clear, no lesions, no  postnasal drip or exudate noted.   NECK:  Supple w/ fair ROM; no JVD; normal carotid impulses w/o bruits; no thyromegaly or nodules palpated; no lymphadenopathy.    RESP  Clear  P & A; w/o, wheezes/ rales/ or rhonchi. no accessory muscle use, no dullness to percussion  CARD:  RRR, no m/r/g, no peripheral edema, pulses intact, no cyanosis or clubbing.  GI:   Soft & nt; nml bowel sounds; no organomegaly or masses detected.   Musco: Warm bil, no deformities or joint swelling noted.   Neuro: alert, no focal deficits noted.    Skin: Warm, no lesions or rashes    Lab Results:  CBC    BNP No results found for: BNP  ProBNP    Component Value Date/Time   PROBNP 55.9 09/25/2010 1140    Imaging: DG Chest 2 View  Result Date: 06/09/2019 CLINICAL DATA:  Shortness of breath worsening since 2 days ago. EXAM: CHEST - 2 VIEW COMPARISON:  05/07/2017 FINDINGS: Cardiac silhouette is normal in size. No mediastinal or hilar masses. No evidence of adenopathy. Lungs are hyperexpanded, but clear. No pleural effusion or pneumothorax. Skeletal structures are intact. IMPRESSION: 1. No acute cardiopulmonary disease. 2. Hyperexpanded lungs stable from the prior exam suggesting COPD. Electronically Signed   By: Lajean Manes M.D.   On: 06/09/2019 12:13   DG CHEST PORT 1 VIEW  Result Date: 06/12/2019 CLINICAL DATA:  74 year old female with history of progressively worsening dyspnea for the past 4-5 days. EXAM: PORTABLE CHEST 1 VIEW COMPARISON:  Chest x-ray 06/09/2019. FINDINGS: Lung volumes are normal. No consolidative airspace  disease. No pleural effusions. No pneumothorax. No pulmonary nodule or mass noted. Pulmonary vasculature and the cardiomediastinal silhouette are within normal limits. Atherosclerosis in the thoracic aorta. IMPRESSION: 1.  No radiographic evidence of acute cardiopulmonary disease. 2. Aortic atherosclerosis. Electronically Signed   By: Vinnie Langton M.D.   On: 06/12/2019 10:56   LE VENOUS  Result Date: 06/22/2019  Lower Venous DVTStudy Indications: Swelling.  Risk Factors: Recently hospitalized for COPD. Comparison Study: No prior exam. Performing Technologist: Baldwin Crown ARDMS, RVT  Examination Guidelines: A complete evaluation includes B-mode imaging, spectral Doppler, color Doppler, and power Doppler as needed of all accessible portions of each vessel. Bilateral testing is considered an integral part of a complete examination. Limited examinations for reoccurring indications may be performed as noted. The reflux portion of the exam is performed with the patient in reverse Trendelenburg.  +---------+---------------+---------+-----------+----------+--------------+ RIGHT    CompressibilityPhasicitySpontaneityPropertiesThrombus Aging +---------+---------------+---------+-----------+----------+--------------+ CFV      Full           Yes      Yes                                 +---------+---------------+---------+-----------+----------+--------------+ SFJ      Full                                                        +---------+---------------+---------+-----------+----------+--------------+ FV Prox  Full                                                        +---------+---------------+---------+-----------+----------+--------------+  FV Mid   Full                                                        +---------+---------------+---------+-----------+----------+--------------+ FV DistalFull                                                         +---------+---------------+---------+-----------+----------+--------------+ PFV      Full                                                        +---------+---------------+---------+-----------+----------+--------------+ POP      Full           Yes      Yes                                 +---------+---------------+---------+-----------+----------+--------------+ PTV      Full                                                        +---------+---------------+---------+-----------+----------+--------------+ PERO     Full                                                        +---------+---------------+---------+-----------+----------+--------------+   +----+---------------+---------+-----------+----------+--------------+ LEFTCompressibilityPhasicitySpontaneityPropertiesThrombus Aging +----+---------------+---------+-----------+----------+--------------+ CFV Full           Yes      Yes                                 +----+---------------+---------+-----------+----------+--------------+     Summary: RIGHT: - There is no evidence of deep vein thrombosis in the lower extremity.  - No cystic structure found in the popliteal fossa.  LEFT: - No evidence of common femoral vein obstruction.  *See table(s) above for measurements and observations. Electronically signed by Ruta Hinds MD on 06/22/2019 at 12:42:02 PM.    Final       PFT Results Latest Ref Rng & Units 11/28/2017 08/16/2016 03/08/2015  FVC-Pre L 1.75 1.67 2.35  FVC-Predicted Pre % 60 54 75  FVC-Post L 2.02 2.01 2.53  FVC-Predicted Post % 69 65 81  Pre FEV1/FVC % % 68 79 74  Post FEV1/FCV % % 63 71 73  FEV1-Pre L 1.19 1.31 1.74  FEV1-Predicted Pre % 54 56 74  FEV1-Post L 1.27 1.42 1.85  DLCO UNC% % 61 61 63  DLCO COR %Predicted % 81 78 72  TLC L 5.28 4.95 5.16  TLC % Predicted % 103 95 99  RV % Predicted % 145 133 118  No results found for: NITRICOXIDE      Assessment & Plan:   COPD GOLD II/  still smoking  Recent exacerbation now improved nearing back to baseline.  Patient continue on current regimen.  Congratulated on smoking cessation.  Plan  Patient Instructions  Continue on BREZTRI 2 puffs Twice daily  , rinse after use.  Continue on oxygen 2 L, goal is to have Oxygen level > 88-90%.  Great job not smoking Activity as tolerated Saline nasal rinses and gel as needed.  Follow-up in 6 to 8 weeks with Dr. Melvyn Novas and as needed   Covid vaccine :  Thanks, Gabrielle White, your appointment is scheduled! [place]: [place] Friday June 27, 2019 Arrive by 12:00 PM Starts at 12:00 PM (15 minutes)   [place] [phone]  This visit is at the [address] [place], [place] [address]. Someone will direct you where to park. For scheduling questions, or to cancel please call [phone].      Rhinitis, chronic Add saline nasal rinses and gel As needed     Chronic respiratory failure with hypoxia (HCC) Cont on O2 , decrease to O2 2l/m  Goal is to have O2 sats >88-90%.       Rexene Edison, NP 06/24/2019

## 2019-06-24 NOTE — Assessment & Plan Note (Signed)
Add saline nasal rinses and gel As needed

## 2019-06-24 NOTE — Patient Instructions (Addendum)
Continue on BREZTRI 2 puffs Twice daily  , rinse after use.  Continue on oxygen 2 L, goal is to have Oxygen level > 88-90%.  Great job not smoking Activity as tolerated Saline nasal rinses and gel as needed.  Follow-up in 6 to 8 weeks with Dr. Melvyn Novas and as needed   Covid vaccine :  Thanks, Frankey Poot, your appointment is scheduled! [place]: [place] Friday June 27, 2019 Arrive by 12:00 PM Starts at 12:00 PM (15 minutes)   [place] [phone]  This visit is at the [address] [place], [place] [address]. Someone will direct you where to park. For scheduling questions, or to cancel please call [phone].

## 2019-06-24 NOTE — Assessment & Plan Note (Signed)
Recent exacerbation now improved nearing back to baseline.  Patient continue on current regimen.  Congratulated on smoking cessation.  Plan  Patient Instructions  Continue on BREZTRI 2 puffs Twice daily  , rinse after use.  Continue on oxygen 2 L, goal is to have Oxygen level > 88-90%.  Great job not smoking Activity as tolerated Saline nasal rinses and gel as needed.  Follow-up in 6 to 8 weeks with Dr. Melvyn Novas and as needed   Covid vaccine :  Thanks, Frankey Poot, your appointment is scheduled! COLISE[facility]: [place] Friday June 27, 2019 Arrive by 12:00 PM Starts at 12:00 PM (15 minutes)   Nettie [phone]  This visit is at the [address] [place], [place] [address]. Someone will direct you where to park. For scheduling questions, or to cancel please call [phone].

## 2019-06-25 ENCOUNTER — Telehealth: Payer: Self-pay | Admitting: Internal Medicine

## 2019-06-25 DIAGNOSIS — J449 Chronic obstructive pulmonary disease, unspecified: Secondary | ICD-10-CM

## 2019-06-25 NOTE — Telephone Encounter (Signed)
Spoke with Carlette at [facility] pulmonary rehab  She states during pt's hospital stay the hospitalist suggested pulmonary rehab and they want to know if Dr Melvyn Novas will order this. Please advise, thanks!

## 2019-06-25 NOTE — Telephone Encounter (Signed)
Called pulmonary rehab department and asked to speak with Carlette but she was not there. I left a message with Janett Billow. Will go ahead and place the order for p. Rehab under MW's name.   Nothing further needed at time of call.

## 2019-06-25 NOTE — Telephone Encounter (Signed)
Gabrielle White from Dr. Melvyn Novas office called and stated Dr. Melvyn Novas will place an order for PR.

## 2019-06-25 NOTE — Telephone Encounter (Signed)
Fine with me

## 2019-06-26 ENCOUNTER — Telehealth (HOSPITAL_COMMUNITY): Payer: Self-pay

## 2019-06-26 NOTE — Telephone Encounter (Signed)
Pt insurance is active and benefits verified through [place] $10, DED 0/0 met, out of pocket $3,900/$70.08 met, co-insurance 0%. no pre-authorization required, REF# [id number]

## 2019-06-27 ENCOUNTER — Telehealth: Payer: Self-pay | Admitting: Adult Health

## 2019-06-27 ENCOUNTER — Telehealth (HOSPITAL_COMMUNITY): Payer: Self-pay

## 2019-06-27 ENCOUNTER — Ambulatory Visit: Payer: Medicare HMO

## 2019-06-30 ENCOUNTER — Telehealth: Payer: Self-pay | Admitting: Internal Medicine

## 2019-06-30 NOTE — Telephone Encounter (Signed)
Error

## 2019-06-30 NOTE — Telephone Encounter (Signed)
LMTCB x 1 

## 2019-07-01 ENCOUNTER — Ambulatory Visit: Payer: Medicare HMO

## 2019-07-01 MED ORDER — PANTOPRAZOLE SODIUM 40 MG PO TBEC: 40.0000 mg | DELAYED_RELEASE_TABLET | Freq: Every day | ORAL | 2 refills | Status: DC

## 2019-07-01 NOTE — Telephone Encounter (Signed)
Pt called back, please return call  

## 2019-07-01 NOTE — Telephone Encounter (Signed)
Spoke with the pt and notified of recs per [facility]  She verbalized understanding  Rx refilled for pantoprazole per her request

## 2019-07-01 NOTE — Telephone Encounter (Signed)
ATC pt, no answer. Left message for pt to call back.  

## 2019-07-01 NOTE — Telephone Encounter (Signed)
Yes she should stay on pantoprazole. Take tesslon as needed for cough. Ok to get covid vaccine.

## 2019-07-01 NOTE — Telephone Encounter (Signed)
Spoke with the pt  She states hospitalized from 06/09/19-06/15/19  She was prescribed o2 during this time  She is still on o2 and so wants to make sure it's ok to get her second covid vaccine bc of this  Dr Melvyn Novas had told her not to get this if she was "sick"- she states does not feel ill, but also does not know how long she is to remain on o2 for.  She was also prescribed pantoprazole and tessalon in the hospital and wondering if she should stay on these.  She is not coughing so not really using the tessalon.  I explained this is just as needed.  Please advise on pantoprazole and the covid vaccine, thanks

## 2019-07-03 ENCOUNTER — Ambulatory Visit (HOSPITAL_COMMUNITY): Payer: Medicare HMO

## 2019-07-03 ENCOUNTER — Telehealth (HOSPITAL_COMMUNITY): Payer: Self-pay | Admitting: *Deleted

## 2019-07-03 NOTE — Telephone Encounter (Signed)
Patient was a no show for her 2:15 pm appointment for a walk test/orientation in pulmonary rehab.  When called she states she forgot, then asked what the program was about.  We discussed details of program program when we scheduled her for this appointment approximately 1 week ago.  She is applying for a job and we decided to not reschedule her until she is sure she can fully participate in the program.  Gave patient my name and contact information to call when she decides if she would like to participate in pulmonary rehab.

## 2019-07-17 DIAGNOSIS — J449 Chronic obstructive pulmonary disease, unspecified: Secondary | ICD-10-CM | POA: Diagnosis not present

## 2019-07-18 ENCOUNTER — Ambulatory Visit: Payer: Medicare HMO

## 2019-07-21 ENCOUNTER — Other Ambulatory Visit: Payer: Self-pay

## 2019-07-21 MED ORDER — SERTRALINE HCL 100 MG PO TABS: 100.0000 mg | ORAL_TABLET | Freq: Every day | ORAL | 3 refills | Status: DC

## 2019-07-21 NOTE — Telephone Encounter (Signed)
Received refill request. Never received refill last month. Resent.

## 2019-07-21 NOTE — Telephone Encounter (Signed)
Patient pharmacy sent fax for this medication. I already sent a refill for this medication last month but for some reason when I called they said they never received it. Please Advise.

## 2019-07-22 ENCOUNTER — Inpatient Hospital Stay: Admission: RE | Admit: 2019-07-22 | Payer: Medicare HMO | Source: Ambulatory Visit

## 2019-07-24 ENCOUNTER — Ambulatory Visit: Payer: Medicare HMO

## 2019-07-28 ENCOUNTER — Ambulatory Visit: Payer: Medicare HMO

## 2019-07-29 ENCOUNTER — Telehealth (HOSPITAL_COMMUNITY): Payer: Self-pay

## 2019-07-29 DIAGNOSIS — H3562 Retinal hemorrhage, left eye: Secondary | ICD-10-CM | POA: Diagnosis not present

## 2019-07-29 DIAGNOSIS — H35033 Hypertensive retinopathy, bilateral: Secondary | ICD-10-CM | POA: Diagnosis not present

## 2019-07-29 DIAGNOSIS — H524 Presbyopia: Secondary | ICD-10-CM | POA: Diagnosis not present

## 2019-07-29 DIAGNOSIS — Z961 Presence of intraocular lens: Secondary | ICD-10-CM | POA: Diagnosis not present

## 2019-07-29 NOTE — Telephone Encounter (Signed)
Pt did not call back and unable to reach regarding pulmonary rehab. Closed referral.

## 2019-07-30 ENCOUNTER — Telehealth: Payer: Self-pay | Admitting: *Deleted

## 2019-07-30 DIAGNOSIS — Z0100 Encounter for examination of eyes and vision without abnormal findings: Secondary | ICD-10-CM | POA: Diagnosis not present

## 2019-07-30 DIAGNOSIS — R739 Hyperglycemia, unspecified: Secondary | ICD-10-CM

## 2019-07-30 NOTE — Telephone Encounter (Addendum)
Patient notified and agreed. Confirmed lab appointment with patient.

## 2019-07-30 NOTE — Telephone Encounter (Signed)
a1c has been added

## 2019-07-30 NOTE — Telephone Encounter (Signed)
Patient called and stated that she saw her eye Dr. Wilburn Mylar and he took a picture of her eyes and stated that there was a possibility that she has Diabetes. Patient stated that she has labs scheduled to have done and she is wondering if a test could be added to those labs to check for diabetes and if she would need to come in fasting when she has those labs done.  Please Advise.

## 2019-07-31 ENCOUNTER — Ambulatory Visit: Payer: Medicare HMO

## 2019-08-04 ENCOUNTER — Other Ambulatory Visit: Payer: Self-pay

## 2019-08-04 ENCOUNTER — Ambulatory Visit
Admission: RE | Admit: 2019-08-04 | Discharge: 2019-08-04 | Disposition: A | Discharge status: Home / Self Care | Payer: Medicare HMO | Source: Ambulatory Visit | Attending: Nurse Practitioner | Admitting: Nurse Practitioner

## 2019-08-04 DIAGNOSIS — Z1231 Encounter for screening mammogram for malignant neoplasm of breast: Secondary | ICD-10-CM

## 2019-08-06 ENCOUNTER — Ambulatory Visit (INDEPENDENT_AMBULATORY_CARE_PROVIDER_SITE_OTHER): Payer: Medicare HMO | Admitting: Nurse Practitioner

## 2019-08-06 ENCOUNTER — Encounter: Payer: Self-pay | Admitting: Nurse Practitioner

## 2019-08-06 ENCOUNTER — Other Ambulatory Visit: Payer: Self-pay

## 2019-08-06 VITALS — BP 130/80 | HR 90 | Temp 96.9°F | Ht 65.0 in | Wt 170.0 lb

## 2019-08-06 DIAGNOSIS — L03115 Cellulitis of right lower limb: Secondary | ICD-10-CM | POA: Diagnosis not present

## 2019-08-06 DIAGNOSIS — M542 Cervicalgia: Secondary | ICD-10-CM

## 2019-08-06 DIAGNOSIS — R739 Hyperglycemia, unspecified: Secondary | ICD-10-CM | POA: Diagnosis not present

## 2019-08-06 DIAGNOSIS — J441 Chronic obstructive pulmonary disease with (acute) exacerbation: Secondary | ICD-10-CM | POA: Diagnosis not present

## 2019-08-06 MED ORDER — DOXYCYCLINE HYCLATE 100 MG PO TABS: 100.0000 mg | ORAL_TABLET | Freq: Two times a day (BID) | ORAL | 0 refills | Status: DC

## 2019-08-06 NOTE — Patient Instructions (Signed)
Quit smoking  Continue to cut back on sugar  Can use aleve 1 tablet twice daily for 5 days with heat twice daily  Supportive pillow Sleep in bed  Doxycyline 100 mg by mouth twice daily for 1 week- take with food.   To use Claritin or zyrtec (PLAIN) over the counter daily as needed for allergy type symptoms

## 2019-08-06 NOTE — Progress Notes (Signed)
Careteam: Patient Care Team: Lauree Chandler, NP as PCP - General (Geriatric Medicine)  PLACE OF SERVICE:  [place]  Advanced Directive information    Allergies  Allergen Reactions  . Cafergot   . Codeine   . Fenoprofen Calcium   . Nalfon [Fenoprofen]     Chief Complaint  Patient presents with  . Acute Visit    Possible infected tick bite on right thigh. Patient c/o body aches, worse yesterday. Patient also c/o neck pain.   . Labs Only    Patient had eye exam 2 weeks ago and was told she could possibly have diabetes and would like to f/u on this.      HPI: Patient is a 74 y.o. female with a possible infected tick bite on her right thigh. Pt pulled the tick off of her over a week ago. Pt is complaining of body aches worsening since yesterday. Area around bite continues to be red.    Pt is also having neck pain but she thinking this is from sleeping on the couch. Tender with ROM, does not radiate. No weakness or numbness to arms    Review of Systems:  Review of Systems  Constitutional: Positive for malaise/fatigue. Negative for chills and fever.  Respiratory: Positive for cough and shortness of breath (with exertion).   Cardiovascular: Negative for chest pain and palpitations.  Gastrointestinal: Negative for abdominal pain, nausea and vomiting.  Musculoskeletal: Positive for falls (recently- chasing her cat) and myalgias.  Skin: Negative for itching and rash.       Tick bite on right thigh  Neurological: Negative for dizziness, weakness and headaches.    Past Medical History:  Diagnosis Date  . Chronic airway obstruction, not elsewhere classified   . Depressive disorder   . Depressive disorder, not elsewhere classified   . Emphysema   . Enthesopathy of ankle and tarsus, unspecified   . HTN (hypertension)   . Hyperlipidemia   . Hypertension   . Hypopotassemia   . Leukocytosis   . Migraine, unspecified, without mention of intractable migraine without  mention of status migrainosus   . Nonspecific (abnormal) findings on radiological and other examination of skull and head   . Osteoarthrosis, unspecified whether generalized or localized, unspecified site   . Other emphysema ([place])   . Other specified disease of white blood cells   . Palpitations   . [facility] spotted fever) 08/23/2015   Positive IgM. Treated with doxycycline.   . Tobacco abuse    Past Surgical History:  Procedure Laterality Date  . CESAREAN SECTION    . COLONOSCOPY  08/14/2008   Dr.. Delfin Edis   Social History:   reports that she has been smoking cigarettes. She has smoked for the past 50.00 years. She has never used smokeless tobacco. She reports that she does not drink alcohol or use drugs.  Family History  Problem Relation Age of Onset  . Alzheimer's disease Father   . Heart disease Father   . Diabetes Father   . Skin cancer Father   . Heart disease Mother   . Breast cancer Mother     Medications: Patient's Medications  New Prescriptions   No medications on file  Previous Medications   ACETAMINOPHEN (TYLENOL) 500 MG TABLET    Take 500 mg by mouth every 6 (six) hours as needed for mild pain or headache.   ALBUTEROL (VENTOLIN HFA) 108 (90 BASE) MCG/ACT INHALER    Inhale 2 puffs into the lungs  every 6 (six) hours as needed for wheezing or shortness of breath.   ALPRAZOLAM (XANAX) 0.5 MG TABLET    Take 0.5 mg by mouth 2 (two) times daily as needed for anxiety.   ATORVASTATIN (LIPITOR) 40 MG TABLET    Take 40 mg by mouth daily.   BENZONATATE (TESSALON) 100 MG CAPSULE    Take 1 capsule (100 mg total) by mouth 2 (two) times daily as needed for cough.   BUDESON-GLYCOPYRROL-FORMOTEROL (BREZTRI AEROSPHERE) 160-9-4.8 MCG/ACT AERO    Inhale 2 puffs into the lungs 2 (two) times daily.   CHOLECALCIFEROL (VITAMIN D3) 1000 UNITS CAPS    Take 1 capsule by mouth daily.     LEVOTHYROXINE (SYNTHROID) 125 MCG TABLET    Take 1 tablet (125 mcg total) by mouth daily.    LOSARTAN (COZAAR) 50 MG TABLET    Take 50 mg by mouth daily.   PANTOPRAZOLE (PROTONIX) 40 MG TABLET    Take 1 tablet (40 mg total) by mouth daily before breakfast.   SERTRALINE (ZOLOFT) 100 MG TABLET    Take 1 tablet (100 mg total) by mouth at bedtime.  Modified Medications   No medications on file  Discontinued Medications   No medications on file    Physical Exam:  Vitals:   08/06/19 1437  BP: 130/80  Pulse: 90  Temp: (!) 96.9 F (36.1 C)  TempSrc: Temporal  SpO2: 97%  Weight: 170 lb (77.1 kg)  Height: '5\' 5"'  (1.651 m)   Body mass index is 28.29 kg/m. Wt Readings from Last 3 Encounters:  08/06/19 170 lb (77.1 kg)  06/24/19 164 lb (74.4 kg)  06/23/19 165 lb (74.8 kg)    Physical Exam Vitals reviewed.  Constitutional:      Appearance: Normal appearance.  HENT:     Nose: Congestion and rhinorrhea present.  Cardiovascular:     Rate and Rhythm: Normal rate and regular rhythm.     Pulses: Normal pulses.     Heart sounds: Normal heart sounds, S1 normal and S2 normal.  Pulmonary:     Effort: Accessory muscle usage present.     Breath sounds: Normal breath sounds.  Musculoskeletal:     Cervical back: No pain with movement.     Right lower leg: No edema.     Left lower leg: No edema.  Skin:    General: Skin is warm and dry.     Findings: Erythema (bite on her right thigh) present.       Neurological:     Mental Status: She is alert.  Psychiatric:        Attention and Perception: Attention and perception normal.        Mood and Affect: Mood normal.        Speech: Speech normal.        Behavior: Behavior normal.        Thought Content: Thought content normal.     Labs reviewed: Basic Metabolic Panel: Recent Labs    08/19/18 1131 11/22/18 1521 11/22/18 1521 06/09/19 1105 06/10/19 0805 06/12/19 0444 06/14/19 0536 06/20/19 1957  NA  --  141   < > 140   < > 142 139 141  K  --  4.3   < > 4.0   < > 4.1 4.1 4.1  CL  --  102   < > 103   < > 104 101 104   CO2  --  29   < > 30   < > 28 30  28  GLUCOSE  --  90   < > 121*   < > 100* 142* 112*  BUN  --  11   < > 15   < > '19 23 20  ' CREATININE  --  0.79  --  0.76   < > 0.64 0.61 0.97  CALCIUM  --  9.6   < > 9.4   < > 8.8* 8.7* 8.6*  MG  --   --   --  2.1  --  2.2 2.3  --   PHOS  --   --   --  4.0  --   --   --   --   TSH 0.77 3.08  --   --   --   --   --   --    < > = values in this interval not displayed.   Liver Function Tests: Recent Labs    11/22/18 1521 06/09/19 1105  AST 12 17  ALT 11 21  ALKPHOS  --  118  BILITOT 0.5 0.8  PROT 6.7 7.5  ALBUMIN  --  4.2   No results for input(s): LIPASE, AMYLASE in the last 8760 hours. No results for input(s): AMMONIA in the last 8760 hours. CBC: Recent Labs    11/22/18 1521 11/22/18 1521 06/09/19 1105 06/10/19 0805 06/12/19 0444 06/14/19 0536 06/20/19 1957  WBC 9.6   < > 11.3*   < > 12.8* 11.3* 19.9*  NEUTROABS 6,230  --  7.6  --   --   --   --   HGB 15.4   < > 15.6*   < > 14.9 13.9 14.4  HCT 46.2*   < > 49.8*   < > 48.8* 44.0 44.3  MCV 92.6   < > 97.5   < > 100.2* 97.1 95.9  PLT 283   < > 322   < > 317 286 300   < > = values in this interval not displayed.   Lipid Panel: Recent Labs    11/22/18 1521  CHOL 171  HDL 55  LDLCALC 93  TRIG 136  CHOLHDL 3.1   TSH: Recent Labs    08/19/18 1131 11/22/18 1521  TSH 0.77 3.08   A1C: Lab Results  Component Value Date   HGBA1C 5.9 (H) 06/17/2013     Assessment/Plan 1. Cellulitis of right lower extremity - Pt has a erythema bite on her right thigh that has been persistent for 1 week without improvement.  - doxycycline (VIBRA-TABS) 100 MG tablet; Take 1 tablet (100 mg total) by mouth 2 (two) times daily.  Dispense: 14 tablet; Refill: 0  2. Neck pain -she has been sleeping on the couch, not using a pillow, encouraged proper body alignment  -can use aleve 1 tablet BID x 5 days with food and heating pad BID.   3. Hyperglycemia -Fasting glucose increase on recent labs.. Pt  concerned that she might have diabetes due to recent eye exam. Encouraged dietary changes, low sugar diet. She has been making modifications recently  - Hemoglobin A1c  4. COPD with acute exacerbation ([place]) -Pt has a congested cough; not coughing up any phlegm -No wheezing, fever or shortness of breath  -she is on doxycyline which could benefit if having bronchitis.  -encouraged claritin or zyrtec 10 mg daily to also help with allergies.  -quit smoking.  -Continue albuterol inhaler as needed - CBC with Differential/Platelet - BMP with eGFR([facility])   Next appt: 08/27/2019 as scheduled, sooner if needed.  Carlos American. Aalijah Lanphere, [facility] [phone]   I personally was present during the history, physical exam and medical decision-making activities of this service and have verified that the service and findings are accurately documented in the student's note

## 2019-08-07 LAB — CBC WITH DIFFERENTIAL/PLATELET
Absolute Monocytes: 760 cells/uL (ref 200–950)
Basophils Absolute: 92 cells/uL (ref 0–200)
Basophils Relative: 0.7 %
Eosinophils Absolute: 485 cells/uL (ref 15–500)
Eosinophils Relative: 3.7 %
HCT: 41.7 % (ref 35.0–45.0)
Hemoglobin: 13.7 g/dL (ref 11.7–15.5)
Lymphs Abs: 3196 cells/uL (ref 850–3900)
MCH: 30.9 pg (ref 27.0–33.0)
MCHC: 32.9 g/dL (ref 32.0–36.0)
MCV: 93.9 fL (ref 80.0–100.0)
MPV: 11.3 fL (ref 7.5–12.5)
Monocytes Relative: 5.8 %
Neutro Abs: 8567 cells/uL — ABNORMAL HIGH (ref 1500–7800)
Neutrophils Relative %: 65.4 %
Platelets: 289 10*3/uL (ref 140–400)
RBC: 4.44 10*6/uL (ref 3.80–5.10)
RDW: 12.9 % (ref 11.0–15.0)
Total Lymphocyte: 24.4 %
WBC: 13.1 10*3/uL — ABNORMAL HIGH (ref 3.8–10.8)

## 2019-08-07 LAB — BASIC METABOLIC PANEL WITH GFR
BUN: 19 mg/dL (ref 7–25)
CO2: 25 mmol/L (ref 20–32)
Calcium: 9.6 mg/dL (ref 8.6–10.4)
Chloride: 106 mmol/L (ref 98–110)
Creat: 0.75 mg/dL (ref 0.60–0.93)
GFR, Est African American: 91 mL/min/{1.73_m2} (ref >=60)
GFR, Est Non African American: 79 mL/min/{1.73_m2} (ref >=60)
Glucose, Bld: 89 mg/dL (ref 65–139)
Potassium: 4.2 mmol/L (ref 3.5–5.3)
Sodium: 140 mmol/L (ref 135–146)

## 2019-08-07 LAB — HEMOGLOBIN A1C
Hgb A1c MFr Bld: 5.3 % of total Hgb (ref <5.7)
Mean Plasma Glucose: 105 (calc)
eAG (mmol/L): 5.8 (calc)

## 2019-08-14 ENCOUNTER — Encounter: Payer: Self-pay | Admitting: Internal Medicine

## 2019-08-14 ENCOUNTER — Ambulatory Visit: Payer: Medicare HMO | Admitting: Internal Medicine

## 2019-08-14 ENCOUNTER — Other Ambulatory Visit: Payer: Self-pay

## 2019-08-14 VITALS — BP 164/90 | HR 75 | Temp 97.9°F | Ht 65.0 in | Wt 168.0 lb

## 2019-08-14 DIAGNOSIS — J9611 Chronic respiratory failure with hypoxia: Secondary | ICD-10-CM | POA: Diagnosis not present

## 2019-08-14 DIAGNOSIS — J449 Chronic obstructive pulmonary disease, unspecified: Secondary | ICD-10-CM

## 2019-08-14 MED ORDER — PANTOPRAZOLE SODIUM 40 MG PO TBEC: 40.0000 mg | DELAYED_RELEASE_TABLET | Freq: Every day | ORAL | 2 refills | Status: DC

## 2019-08-14 MED ORDER — FAMOTIDINE 20 MG PO TABS: ORAL_TABLET | ORAL | 11 refills | Status: DC

## 2019-08-14 NOTE — Progress Notes (Signed)
Subjective:   Patient ID: Gabrielle White, female    DOB: 09-07-1945 .   MRN: [id number]    Brief patient profile:  73  yowf  MM/ active smoker with AB/GOLD 0 copd by pfts 03/08/15 and GOLD II criteria 11/28/2017     History of Present Illness  01/22/2015 acute extended re-establish ov/Gabrielle White re:  AB/ still smoking  Chief Complaint  Patient presents with  . Pulmonary Consult    pt last seen in 2014. pt states DR. Green wanted her to follow up. pt states somethines her throat feels like it closes up and she cant breath. pt states she feels like she has to take really deep breaths. pt c/o chest tightness, and prod cough white in color.pt c/o thrush she thiks may come from the inhailers.     Last better breathing  x 4 weeks prior to OV  p using prednisone but benefit only for a few days p stopped despite maint rx with symbicort (though hfa poor)  Nl routine is symbicort 160 2 bid and maybe ventolin and occ brovana no recent duoneb need  In retrospect really hasn't been able to really stay well x 4 y Cough is harsh and congested worse day than noct  rec Start Prednisone 10mg  Take 4 for two days three for two days two for two days one for two days  Plan A =  Automatic = symbicort 160 Take 2 puffs first thing in am and then another 2 puffs about 12 hours later.  Work on Interior and spatial designer: Plan B = Backup  Only use your albuterol  Plan C = crisis  Only use nebulizer iprotropium-albuterol (duoneb) if you try the ventolin first and it fails to help > ok to use up to every 4 hours  Plan D = doctor call us if not improving  Plan E = ER > go there if all else fails Try prilosec otc 20mg   Take 30-60 min before first meal of the day and Pepcid ac (famotidine) 20 mg one @  bedtime    GERD (REFLUX) diet             01/24/2019  f/u ov/Gabrielle White re: GOLD II/ still smoking maint rx symb 160 2bid  Chief Complaint  Patient presents with  . Follow-up    Breathing may be slightly better  since the last visit- relates to cooler weather- using proair 2-3 x per day.   Dyspnea:  MMRC2 = can't walk a nl pace on a flat grade s sob but does fine slow and flat but by hour 10 wearing off symb and needs albuterol typically prior to pm dose of symb Cough: spells sporacic, white daytime mostly  Sleeping: sleeping on enough pillows to get 30 degrees  SABA use: as above  rec Plan A = Automatic = Always=    Breztri Take 2 puffs first thing in am and then another 2 puffs about 12 hours later.  Work on inhaler technique:    Plan B = Backup (to supplement plan A, not to replace it) Only use your albuterol inhaler as a rescue medication   Prednisone 10 mg take  4 each am x 2 days,   2 each am x 2 days,  1 each am x 2 days and stop  Please schedule a follow up office visit in 6 months, call sooner if needed     02/21/2019  f/u ov/Gabrielle White re: GOLD II/ still smoking/ problems with access to affordable  meds / does not have pred refillable as Plan C Chief Complaint  Patient presents with  . Follow-up    Patient is here for follow up for COPD. Insurance denied [employer].  Dyspnea:  Walking end of road and back now x 15-20 min s stopping / slow pace = MMRC2 = can't walk a nl pace on a flat grade s sob but does fine slow and flat  Cough: some sporadic esp in am / minimal mucoid sputum   Sleeping: no resp problems SABA use: avg twice daily at most, less while on breztri rec Plan A = Automatic = Always=    Breztri = Bevespi (one or the other)  Take 2 puffs first thing in am and then another 2 puffs about 12 hours later.  Plan B = Backup (to supplement plan A, not to replace it) Only use your albuterol inhaler  Add Plan C: crisis If not improving or needing more albuterol than usual : Prednisone 10 mg take  4 each am x 2 days,   2 each am x 2 days,  1 each am x 2 days and stop  The key is to stop smoking completely before smoking completely stops you!   03/17/2019  f/u ov/Gabrielle White re:  GOLD II copd/ still  smoking/ anoro and stiolto approved by [facility] but [facility] her choice  will be available Apr 04 2019  And using symb 160  2bid Chief Complaint  Patient presents with  . Follow-up    Pt states she had a COPD exacerbation 5 nights ago after she believes she might have taken too much anoro. Since the exacerbation, pt stated she felt like a "zombie" but now pt said she is getting better. Pt has occ cough with white to yellow phlegm.  Dyspnea:  No longer doing any outdoor walking  Cough: none  Sleeping: no resp symptoms  SABA use: once or twice daily  02: none  rec Plan A = Automatic = Always=    Breztri Take 2 puffs first thing in am and then another 2 puffs about 12 hours later.  Work on inhaler technique:   Plan B = Backup (to supplement plan A, not to replace it) Only use your albuterol inhaler (proair)  as a rescue medication  Plan C = Crisis (instead of Plan B but only if Plan B stops working) - Prednisone 10 mg take  4 each am x 2 days,   2 each am x 2 days,  1 each am x 2 days and stop    06/09/2019  f/u ov/Gabrielle White re:  GOLD II copd/ still smoking / breztri  Chief Complaint  Patient presents with  . Follow-up    Increased SOB x 3 days. She has been coughing more over the past couple of days- minimal clear sputum.   Dyspnea: very sedentary this winter / now acutely sob with min activity In setting of severe congested cough but only minimal white mucus prod Sleeping: baseline fine, only 2 hours night prior to ov due sob  SABA use: 3-4 x per week then acutely increased x  3 days prior to OV   02: none Did not try prednisone as "plan C" since getting covid shot 06/11/19 and didn't want to interfere with that rec Go directly to [place] [facility] for evaluation of new dx of respiratory failure/ copd exacerbation    Admit date: 06/09/2019 Discharge date: 06/15/2019   Principal Problem:   Acute respiratory failure with hypoxemia  Cigarette smoker   Hyperlipidemia    Hypothyroidism   Hyperglycemia   Essential hypertension   Depression   Polycythemia   COPD exacerbation (HCC)   History of Present Illness:   Patient is 74 year old female with history of COPD/emphysema, anxiety, depression, insomnia, hypertension, hyperlipidemia, tobacco abuse who presented to the ED from her pulmonologist office (Dr. Melvyn Novas) due to progressively worsening dyspnea for 4 to 5 days with associated wheezing without responding to albuterol at home. Patient also had increased sputum production. ED Course:Initial vital signs temperature 98.9 F, pulse 92, respirations 22, blood pressure 132/81 mmHg and O2 sat 91% on room air. The patient was given bronchodilator and 60 mg of prednisone. She became hypoxic and tachypneic with ambulation, so our service was asked to evaluate for further treatment. CBC shows white count 11.3, hemoglobin 15.6 g/dL and platelets 322. PT 12.3 and INR 0.9. Venous blood gas showed a decreased PO2 of 48.30 mmHg and bicarbonate of 29.7 mmol/L. CMP showed a glucose of 121 mg/dL, but was otherwise normal. Lactic acid was 1.1 mmol/L. SARS coronavirus 2, influenza A and B by PCR was negative. Her chest radiograph did not have any acute cardiopulmonary disease, but showed hyperinflation compatible with emphysema.  Hospital Course:   Following conditions were addressed during hospitalization as listed below,  Acute hypoxic respiratory failure secondary to COPD exacerbation.Likely secondary to viral illness and ongoing smoking.. Patient was seen by pulmonary during hospitalization.. Patient likely has gold stage II COPD. Pulmonary has an impression that patient has been advancing in her COPD due to continued smoking as evidenced by declining FEV1. Patient continues to smoke until just recently. Patient was  optimized on bronchodilators steroids but persisted to need 3 L of oxygen.  Patient will need oxygen on discharge.  Patient was emphasized the  need for quitting smoking, follow-up with your pulmonary physician and will consider ambulatory pulmonary rehab follow-up.  Continue p.o. prednisone taper on discharge.  Essential hypertensionon losartan. Continue. Closely monitor  Polycythemia secondary to COPD.  Hyperlipidemiaon atorvastatin  Hypothyroidismon levothyroxine  Depression on sertraline  GERD Continue Protonix    08/14/2019  Post hospf/u ov/Gabrielle White re: not smoking / Breztri maint  Chief Complaint  Patient presents with  . Follow-up    pt states in hospital for week.pt uses rescue haler2 a week. pt states uses 3l of 02  Dyspnea:  Sitting usually fine / also supine / steps are a problem but ok slow flat = MMRC2 = can't walk a nl pace on a flat grade s sob but does fine slow and flat eg  Cough: still some rattling but much less am mucus production  Sleeping: bed is flat, pillows to 30 degrees SABA use: sev times  A day  02:  No longer using due to electric bill went up on it    No obvious day to day or daytime variability or assoc excess/ purulent sputum or mucus plugs or hemoptysis or cp or chest tightness, subjective wheeze or overt sinus or hb symptoms.   Sleeping  without nocturnal  or early am exacerbation  of respiratory  c/o's or need for noct saba. Also denies any obvious fluctuation of symptoms with weather or environmental changes or other aggravating or alleviating factors except as outlined above   No unusual exposure hx or h/o childhood pna/ asthma or knowledge of premature birth.  Current Allergies, Complete Past Medical History, Past Surgical History, Family History, and Social History were reviewed in [employer] record.  ROS  The following are not active complaints unless bolded Hoarseness, sore throat, dysphagia, dental problems, itching, sneezing,  nasal congestion or discharge of excess mucus or purulent secretions, ear ache,   fever, chills, sweats, unintended wt  loss or wt gain, classically pleuritic or exertional cp,  orthopnea pnd or arm/hand swelling  or leg swelling, presyncope, palpitations, abdominal pain, anorexia, nausea, vomiting, diarrhea  or change in bowel habits or change in bladder habits, change in stools or change in urine, dysuria, hematuria,  rash, arthralgias, visual complaints, headache, numbness, weakness or ataxia or problems with walking or coordination,  change in mood or  memory.        Current Meds  Medication Sig  . acetaminophen (TYLENOL) 500 MG tablet Take 500 mg by mouth every 6 (six) hours as needed for mild pain or headache.  . albuterol (VENTOLIN HFA) 108 (90 Base) MCG/ACT inhaler Inhale 2 puffs into the lungs every 6 (six) hours as needed for wheezing or shortness of breath.  . ALPRAZolam (XANAX) 0.5 MG tablet Take 0.5 mg by mouth 2 (two) times daily as needed for anxiety.  Marland Kitchen atorvastatin (LIPITOR) 40 MG tablet Take 40 mg by mouth daily.  . Budeson-Glycopyrrol-Formoterol (BREZTRI AEROSPHERE) 160-9-4.8 MCG/ACT AERO Inhale 2 puffs into the lungs 2 (two) times daily.  . Cholecalciferol (VITAMIN D3) 1000 UNITS CAPS Take 1 capsule by mouth daily.    Marland Kitchen levothyroxine (SYNTHROID) 125 MCG tablet Take 1 tablet (125 mcg total) by mouth daily.  Marland Kitchen losartan (COZAAR) 50 MG tablet Take 50 mg by mouth daily.  . sertraline (ZOLOFT) 100 MG tablet Take 1 tablet (100 mg total) by mouth at bedtime.                          Objective:   Physical Exam   08/14/2019     168 06/09/2019       157  03/17/2019   157 02/21/2019   154  01/24/2019   156  12/11/2018       157  01/22/2015      156 > 03/08/2015   162 > 06/07/2015  170 > 03/20/2016 160 > 05/15/2016   162 >   08/16/2016 163 > 11/20/2016  161 > 12/12/2016  162 > 05/23/2017  159 > 11/28/2017  159  > 03/11/2018  164 > 06/10/2018  161     01/16/13 156 lb (70.761 kg)  01/08/13 155 lb 3.2 oz (70.398 kg)  12/20/12 157 lb 9.6 oz (71.487 kg)       Vital signs reviewed  08/14/2019  - Note at rest  02 sats  94% on RA    HEENT : pt wearing mask not removed for exam due to covid -19 concerns.    NECK :  without JVD/Nodes/TM/ nl carotid upstrokes bilaterally   LUNGS: no acc muscle use,  Nl contour chest which is clear to A and P bilaterally without cough on insp or exp maneuvers   CV:  RRR  no s3 or murmur or increase in P2, and no edema   ABD:  soft and nontender with nl inspiratory excursion in the supine position. No bruits or organomegaly appreciated, bowel sounds nl  MS:  Nl gait/ ext warm without deformities, calf tenderness, cyanosis or clubbing No obvious joint restrictions   SKIN: warm and dry without lesions    NEURO:  alert, approp, nl sensorium with  no motor or cerebellar deficits apparent.  I personally reviewed images and agree with radiology impression as follows:  CXR:   06/12/19 No radiographic evidence of acute cardiopulmonary disease.         Assessment & Plan:

## 2019-08-14 NOTE — Patient Instructions (Addendum)
Plan A = Automatic = Always=    Breztri Take 2 puffs first thing in am and then another 2 puffs about 12 hours later.   Work on inhaler technique:  relax and gently blow all the way out then take a nice smooth deep breath back in, triggering the inhaler at same time you start breathing in.  Hold for up to 5 seconds if you can. Blow out thru nose. Rinse and gargle with water when done   Plan B = Backup (to supplement plan A, not to replace it) Only use your albuterol inhaler as a rescue medication to be used if you can't catch your breath by resting or doing a relaxed purse lip breathing pattern.  - The less you use it, the better it will work when you need it. - Ok to use the inhaler up to 2 puffs  every 4 hours if you must but call for appointment if use goes up over your usual need - Don't leave home without it !!  (think of it like the spare tire for your car)   Ok to try albuterol 15 min before an activity that you know would make you short of breath and see if it makes any difference and if makes none then don't take it after activity unless you can't catch your breath.      For cough /congestion > mucinex dm  1200 mg every 12 hours and use flutter valve as much as you can  Pantoprazole (protonix) 40 mg   Take  30-60 min before first meal of the day and Pepcid (famotidine)  20 mg one after supper until return to office - this is the best way to tell whether stomach acid is contributing to your problem.    GERD (REFLUX)  is an extremely common cause of respiratory symptoms just like yours , many times with no obvious heartburn at all.    It can be treated with medication, but also with lifestyle changes including elevation of the head of your bed (ideally with 6 -8inch blocks under the headboard of your bed),  Smoking cessation, avoidance of late meals, excessive alcohol, and avoid fatty foods, chocolate, peppermint, colas, red wine, and acidic juices such as orange juice.  NO MINT OR  MENTHOL PRODUCTS SO NO COUGH DROPS  USE SUGARLESS CANDY INSTEAD (Jolley ranchers or Stover's or Life Savers) or even ice chips will also do - the key is to swallow to prevent all throat clearing. NO OIL BASED VITAMINS - use powdered substitutes.  Avoid fish oil when coughing.    Make sure you check your oxygen saturations at highest level of activity to be sure it stays over 90% and adjust upward to maintain this level if needed but remember to turn it back to previous settings when you stop (to conserve your supply).   Please schedule a follow up office visit in 4 weeks, sooner if needed  with all medications /inhalers/ solutions in hand so we can verify exactly what you are taking. This includes all medications from all doctors and over the counters

## 2019-08-15 ENCOUNTER — Encounter: Payer: Self-pay | Admitting: Internal Medicine

## 2019-08-15 NOTE — Assessment & Plan Note (Signed)
Quit smoking 06/2019 - PFTs  11/18/10  FEV1  2.0 (95%) ratio 73 and no change p saba and DLCO 75 corrects to 100% -PFT's  03/08/2015  FEV1 1.85 (78 % ) ratio 73  p 5 % improvement from saba with DLCO  63 % corrects to 72 % for alv volume and erv 17   - added flutter 06/07/2015  - PFT's  08/16/2016  FEV1 1.42 (61 % ) ratio 71  p 8 % improvement FEV1 (and 20% FVC) from saba p symb 160  prior to study with DLCO  61/58 % corrects to 78 % for alv volume    - Allergy profile 08/16/2016 >  Eos 0.5 /  IgE  99 RAST neg -  ADDED prednisone x 6 days cycles 11/20/2016  - 05/23/2017  After extensive coaching HFA effectiveness =    90% - PFT's  11/28/2017  FEV1 1.27 (57 % ) ratio 63  p 6 % improvement from saba p symb x 160 x 2  prior to study with DLCO  61 % corrects to 81  % for alv volume   - alpha one AT screen 06/10/2018    MM level 141   - 01/24/2019    try Breztri 2bid and pred x 6 days > much better 02/21/2019  but can't afford so changed to bevespi plus prn pred for flares  - 03/17/2019    Jennell Corner beztri   - 08/14/2019  After extensive coaching inhaler device,  effectiveness =    90% > continue breztri   Group D in terms of symptom/risk and laba/lama/ICS  therefore appropriate rx at this point >>>  Continue breztri/ reviewed ABC plan-  see avs for instructions unique to this ov

## 2019-08-16 ENCOUNTER — Other Ambulatory Visit: Payer: Self-pay | Admitting: Nurse Practitioner

## 2019-08-16 DIAGNOSIS — J449 Chronic obstructive pulmonary disease, unspecified: Secondary | ICD-10-CM | POA: Diagnosis not present

## 2019-08-22 ENCOUNTER — Ambulatory Visit: Payer: Medicare Other | Admitting: Internal Medicine

## 2019-08-27 ENCOUNTER — Other Ambulatory Visit: Payer: Self-pay

## 2019-08-27 ENCOUNTER — Other Ambulatory Visit: Payer: Medicare HMO

## 2019-08-29 ENCOUNTER — Encounter: Payer: Medicare Other | Admitting: Nurse Practitioner

## 2019-08-29 ENCOUNTER — Ambulatory Visit: Payer: Medicare Other | Admitting: Nurse Practitioner

## 2019-09-08 ENCOUNTER — Encounter: Payer: Medicare Other | Admitting: Nurse Practitioner

## 2019-09-08 ENCOUNTER — Encounter: Payer: Self-pay | Admitting: Nurse Practitioner

## 2019-09-08 ENCOUNTER — Ambulatory Visit (INDEPENDENT_AMBULATORY_CARE_PROVIDER_SITE_OTHER): Payer: Medicare HMO | Admitting: Nurse Practitioner

## 2019-09-08 ENCOUNTER — Ambulatory Visit: Payer: Medicare Other | Admitting: Nurse Practitioner

## 2019-09-08 ENCOUNTER — Other Ambulatory Visit: Payer: Self-pay

## 2019-09-08 VITALS — BP 126/70 | HR 82 | Temp 97.1°F | Ht 65.0 in | Wt 173.0 lb

## 2019-09-08 DIAGNOSIS — N3281 Overactive bladder: Secondary | ICD-10-CM

## 2019-09-08 DIAGNOSIS — Z1212 Encounter for screening for malignant neoplasm of rectum: Secondary | ICD-10-CM

## 2019-09-08 DIAGNOSIS — Z1211 Encounter for screening for malignant neoplasm of colon: Secondary | ICD-10-CM | POA: Diagnosis not present

## 2019-09-08 DIAGNOSIS — R0981 Nasal congestion: Secondary | ICD-10-CM | POA: Diagnosis not present

## 2019-09-08 DIAGNOSIS — J441 Chronic obstructive pulmonary disease with (acute) exacerbation: Secondary | ICD-10-CM

## 2019-09-08 DIAGNOSIS — E2839 Other primary ovarian failure: Secondary | ICD-10-CM

## 2019-09-08 DIAGNOSIS — Q046 Congenital cerebral cysts: Secondary | ICD-10-CM | POA: Insufficient documentation

## 2019-09-08 DIAGNOSIS — Z0000 Encounter for general adult medical examination without abnormal findings: Secondary | ICD-10-CM | POA: Diagnosis not present

## 2019-09-08 DIAGNOSIS — I10 Essential (primary) hypertension: Secondary | ICD-10-CM | POA: Diagnosis not present

## 2019-09-08 DIAGNOSIS — L219 Seborrheic dermatitis, unspecified: Secondary | ICD-10-CM | POA: Diagnosis not present

## 2019-09-08 DIAGNOSIS — E039 Hypothyroidism, unspecified: Secondary | ICD-10-CM | POA: Diagnosis not present

## 2019-09-08 NOTE — Progress Notes (Signed)
Subjective:   Gabrielle White is a 74 y.o. female who presents for Medicare Annual (Subsequent) preventive examination.  Review of Systems:   Cardiac Risk Factors include: sedentary lifestyle;smoking/ tobacco exposure;hypertension;dyslipidemia;advanced age (>75men, >36 women)     Objective:     Vitals: BP 126/70    Pulse 82    Temp (!) 97.1 F (36.2 C) (Temporal)    Ht 5\' 5"  (1.651 m)    Wt 173 lb (78.5 kg)    SpO2 95%    BMI 28.79 kg/m   Body mass index is 28.79 kg/m.  Advanced Directives 09/08/2019 09/08/2019 06/23/2019 06/20/2019 06/09/2019 06/09/2019 02/24/2019  Does Patient Have a Medical Advance Directive? Yes Yes No No Yes Yes Yes  Type of Paramedic of [place];Living will [place];Living will Living will - Living will Living will [place];Living will  Does patient want to make changes to medical advance directive? No - Patient declined No - Patient declined No - Patient declined - No - Patient declined - No - Patient declined  Copy of [place] in Chart? No - copy requested No - copy requested - - - - Yes - validated most recent copy scanned in chart (See row information)  Would patient like information on creating a medical advance directive? - - - - - - -    Tobacco Social History   Tobacco Use  Smoking Status Current Some Day Smoker   Years: 50.00   Types: Cigarettes   Last attempt to quit: 07/14/2019   Years since quitting: 0.1  Smokeless Tobacco Never Used     Ready to quit: Not Answered Counseling given: Not Answered   Clinical Intake:  Pre-visit preparation completed: Yes  Pain : No/denies pain     BMI - recorded: 28.79 Nutritional Status: BMI 25 -29 Overweight Diabetes: No  How often do you need to have someone help you when you read instructions, pamphlets, or other written materials from your doctor or pharmacy?: 1 - Never        Past Medical History:  Diagnosis  Date   Chronic airway obstruction, not elsewhere classified    Depressive disorder    Depressive disorder, not elsewhere classified    Emphysema    Enthesopathy of ankle and tarsus, unspecified    HTN (hypertension)    Hyperlipidemia    Hypertension    Hypopotassemia    Leukocytosis    Migraine, unspecified, without mention of intractable migraine without mention of status migrainosus    Nonspecific (abnormal) findings on radiological and other examination of skull and head    Osteoarthrosis, unspecified whether generalized or localized, unspecified site    Other emphysema ([place])    Other specified disease of white blood cells    Palpitations    [facility] [place] spotted fever) 08/23/2015   Positive IgM. Treated with doxycycline.    Tobacco abuse    Past Surgical History:  Procedure Laterality Date   CESAREAN SECTION     COLONOSCOPY  08/14/2008   Dr.. Delfin Edis   Family History  Problem Relation Age of Onset   Alzheimer's disease Father    Heart disease Father    Diabetes Father    Skin cancer Father    Heart disease Mother    Breast cancer Mother    Social History   Socioeconomic History   Marital status: Divorced    Spouse name: Not on file   Number of children: 1  Years of education: BA   Highest education level: Not on file  Occupational History   Occupation: retired Pharmacist, hospital   Occupation: Sub Teacher  Tobacco Use   Smoking status: Current Some Day Smoker    Years: 50.00    Types: Cigarettes    Last attempt to quit: 07/14/2019    Years since quitting: 0.1   Smokeless tobacco: Never Used  Substance and Sexual Activity   Alcohol use: No    Alcohol/week: 0.0 standard drinks   Drug use: No   Sexual activity: Never  Other Topics Concern   Not on file  Social History Narrative   Drinks 2-3 cups of coffee a day    Social Determinants of Health   Financial Resource Strain:    Difficulty of Paying Living Expenses:     Food Insecurity:    Worried About Charity fundraiser in the Last Year:    Arboriculturist in the Last Year:   Transportation Needs:    Film/video editor (Medical):    Lack of Transportation (Non-Medical):   Physical Activity:    Days of Exercise per Week:    Minutes of Exercise per Session:   Stress:    Feeling of Stress :   Social Connections:    Frequency of Communication with Friends and Family:    Frequency of Social Gatherings with Friends and Family:    Attends Religious Services:    Active Member of Clubs or Organizations:    Attends Archivist Meetings:    Marital Status:     Outpatient Encounter Medications as of 09/08/2019  Medication Sig   acetaminophen (TYLENOL) 500 MG tablet Take 500 mg by mouth every 6 (six) hours as needed for mild pain or headache.   albuterol (VENTOLIN HFA) 108 (90 Base) MCG/ACT inhaler Inhale 2 puffs into the lungs every 6 (six) hours as needed for wheezing or shortness of breath.   ALPRAZolam (XANAX) 0.5 MG tablet Take 0.5 mg by mouth 2 (two) times daily as needed for anxiety.   atorvastatin (LIPITOR) 40 MG tablet TAKE 1 TABLET BY MOUTH DAILY FOR HIGH CHOLESTEROL   Budeson-Glycopyrrol-Formoterol (BREZTRI AEROSPHERE) 160-9-4.8 MCG/ACT AERO Inhale 2 puffs into the lungs 2 (two) times daily.   Cholecalciferol (VITAMIN D3) 1000 UNITS CAPS Take 1 capsule by mouth daily.     famotidine (PEPCID) 20 MG tablet One after supper   levothyroxine (SYNTHROID) 125 MCG tablet Take 1 tablet (125 mcg total) by mouth daily.   losartan (COZAAR) 50 MG tablet Take 50 mg by mouth daily.   pantoprazole (PROTONIX) 40 MG tablet Take 1 tablet (40 mg total) by mouth daily before breakfast.   predniSONE (DELTASONE) 1 MG tablet Taper pack   sertraline (ZOLOFT) 100 MG tablet Take 1 tablet (100 mg total) by mouth at bedtime.   No facility-administered encounter medications on file as of 09/08/2019.    Activities of Daily Living In your  present state of health, do you have any difficulty performing the following activities: 09/08/2019 06/09/2019  Hearing? Y N  Vision? N N  Difficulty concentrating or making decisions? Y N  Comment trouble making decisions -  Walking or climbing stairs? N N  Dressing or bathing? N N  Doing errands, shopping? N N  Preparing Food and eating ? N -  Using the Toilet? N -  In the past six months, have you accidently leaked urine? Y -  Comment worse -  Do you have problems with loss of  bowel control? N -  Managing your Medications? N -  Managing your Finances? N -  Housekeeping or managing your Housekeeping? N -  Some recent data might be hidden    Patient Care Team: Lauree Chandler, NP as PCP - General (Geriatric Medicine)    Assessment:   This is a routine wellness examination for Chauntelle.  Exercise Activities and Dietary recommendations Current Exercise Habits: The patient does not participate in regular exercise at present, Exercise limited by: None identified  Goals     Increase physical activity     Quit Smoking     Patient will try to quit smoking       Fall Risk Fall Risk  09/08/2019 09/08/2019 05/26/2019 02/24/2019 11/22/2018  Falls in the past year? 1 1 0 0 1  Number falls in past yr: 0 0 0 0 1  Injury with Fall? 0 0 0 0 0  Comment - - - - -  Risk for fall due to : - - - - History of fall(s)   Is the patient's home free of loose throw rugs in walkways, pet beds, electrical cords, etc?   yes      Grab bars in the bathroom? no      Handrails on the stairs?   yes      Adequate lighting?   yes  Timed Get Up and Go performed: 3 secs  Depression Screen PHQ 2/9 Scores 09/08/2019 09/08/2019 05/26/2019 02/24/2019  PHQ - 2 Score 0 0 0 0  PHQ- 9 Score - - - -     Cognitive Function MMSE - Mini Mental State Exam 09/08/2019 03/19/2017  Orientation to time 5 5  Orientation to Place 5 5  Registration 3 3  Attention/ Calculation 5 5  Recall 3 2  Language- name 2 objects 2 2    Language- repeat 1 1  Language- follow 3 step command 3 3  Language- read & follow direction 1 1  Write a sentence 1 1  Copy design 1 1  Total score 30 29     6CIT Screen 08/28/2018  What Year? 0 points  What month? 0 points  What time? 0 points  Count back from 20 0 points  Months in reverse 0 points  Repeat phrase 0 points  Total Score 0    Immunization History  Administered Date(s) Administered   Fluad Quad(high Dose 65+) 11/22/2018   Influenza Split 01/01/2013   Influenza,inj,Quad PF,6+ Mos 01/08/2013, 12/17/2013, 03/10/2015, 03/06/2016, 05/06/2018   Influenza,inj,quad, With Preservative 02/15/2017   Influenza-Unspecified 02/20/2017   PFIZER SARS-COV-2 Vaccination 05/17/2019, 07/03/2019   Pneumococcal Conjugate-13 10/06/2015   Pneumococcal Polysaccharide-23 04/11/2011   Td 04/04/2004, 11/01/2018   Zoster 01/12/2014    Qualifies for Shingles Vaccine?yes, recommended  Screening Tests Health Maintenance  Topic Date Due   Hepatitis C Screening  Never done   COLONOSCOPY  04/07/2019   INFLUENZA VACCINE  11/02/2019   MAMMOGRAM  08/03/2021   TETANUS/TDAP  10/31/2028   DEXA SCAN  Completed   COVID-19 Vaccine  Completed   PNA vac Low Risk Adult  Completed    Cancer Screenings: Lung: Low Dose CT Chest recommended if Age 30-80 years, 30 pack-year currently smoking OR have quit w/in 15years. Patient does qualify. Due Aug 2021 Breast:  Up to date on Mammogram? Yes   Up to date of Bone Density/Dexa? No Colorectal: overdue  Additional Screenings:  Hepatitis C Screening: recommended      Plan:      I  have personally reviewed and noted the following in the patients chart:    Medical and social history  Use of alcohol, tobacco or illicit drugs   Current medications and supplements  Functional ability and status  Nutritional status  Physical activity  Advanced directives  List of other physicians  Hospitalizations, surgeries, and ER  visits in previous 12 months  Vitals  Screenings to include cognitive, depression, and falls  Referrals and appointments  In addition, I have reviewed and discussed with patient certain preventive protocols, quality metrics, and best practice recommendations. A written personalized care plan for preventive services as well as general preventive health recommendations were provided to patient.     Lauree Chandler, NP  09/08/2019

## 2019-09-08 NOTE — Patient Instructions (Signed)
Gabrielle White , Thank you for taking time to come for your Medicare Wellness Visit. I appreciate your ongoing commitment to your health goals. Please review the following plan we discussed and let me know if I can assist you in the future.   Screening recommendations/referrals: Colonoscopy- DUE at this time.  Mammogram up tod ate Bone Density RECOMMENDED.  Recommended yearly ophthalmology/optometry visit for glaucoma screening and checkup Recommended yearly dental visit for hygiene and checkup  Vaccinations: Influenza vaccine - up to date, due September 2021 Pneumococcal vaccine up to date Tdap vaccine up to date Shingles vaccine RECOMMENDED to get Shigrix at local pharmacy    Advanced directives: most form completed today, to bring living will, healthcare power paper work so we can add to chart  Conditions/risks identified: smoker, advanced age, overweight  Next appointment: 1 year   Preventive Care 39 Years and Older, Female Preventive care refers to lifestyle choices and visits with your health care provider that can promote health and wellness. What does preventive care include?  A yearly physical exam. This is also called an annual well check.  Dental exams once or twice a year.  Routine eye exams. Ask your health care provider how often you should have your eyes checked.  Personal lifestyle choices, including:  Daily care of your teeth and gums.  Regular physical activity.  Eating a healthy diet.  Avoiding tobacco and drug use.  Limiting alcohol use.  Practicing safe sex.  Taking low-dose aspirin every day.  Taking vitamin and mineral supplements as recommended by your health care provider. What happens during an annual well check? The services and screenings done by your health care provider during your annual well check will depend on your age, overall health, lifestyle risk factors, and family history of disease. Counseling  Your health care provider may ask  you questions about your:  Alcohol use.  Tobacco use.  Drug use.  Emotional well-being.  Home and relationship well-being.  Sexual activity.  Eating habits.  History of falls.  Memory and ability to understand (cognition).  Work and work Statistician.  Reproductive health. Screening  You may have the following tests or measurements:  Height, weight, and BMI.  Blood pressure.  Lipid and cholesterol levels. These may be checked every 5 years, or more frequently if you are over 87 years old.  Skin check.  Lung cancer screening. You may have this screening every year starting at age 41 if you have a 30-pack-year history of smoking and currently smoke or have quit within the past 15 years.  Fecal occult blood test (FOBT) of the stool. You may have this test every year starting at age 4.  Flexible sigmoidoscopy or colonoscopy. You may have a sigmoidoscopy every 5 years or a colonoscopy every 10 years starting at age 12.  Hepatitis C blood test.  Hepatitis B blood test.  Sexually transmitted disease (STD) testing.  Diabetes screening. This is done by checking your blood sugar (glucose) after you have not eaten for a while (fasting). You may have this done every 1-3 years.  Bone density scan. This is done to screen for osteoporosis. You may have this done starting at age 51.  Mammogram. This may be done every 1-2 years. Talk to your health care provider about how often you should have regular mammograms. Talk with your health care provider about your test results, treatment options, and if necessary, the need for more tests. Vaccines  Your health care provider may recommend certain vaccines, such  as:  Influenza vaccine. This is recommended every year.  Tetanus, diphtheria, and acellular pertussis (Tdap, Td) vaccine. You may need a Td booster every 10 years.  Zoster vaccine. You may need this after age 50.  Pneumococcal 13-valent conjugate (PCV13) vaccine. One dose  is recommended after age 40.  Pneumococcal polysaccharide (PPSV23) vaccine. One dose is recommended after age 75. Talk to your health care provider about which screenings and vaccines you need and how often you need them. This information is not intended to replace advice given to you by your health care provider. Make sure you discuss any questions you have with your health care provider. Document Released: 04/16/2015 Document Revised: 12/08/2015 Document Reviewed: 01/19/2015 Elsevier Interactive Patient Education  2017 [employer] Prevention in the Home Falls can cause injuries. They can happen to people of all ages. There are many things you can do to make your home safe and to help prevent falls. What can I do on the outside of my home?  Regularly fix the edges of walkways and driveways and fix any cracks.  Remove anything that might make you trip as you walk through a door, such as a raised step or threshold.  Trim any bushes or trees on the path to your home.  Use bright outdoor lighting.  Clear any walking paths of anything that might make someone trip, such as rocks or tools.  Regularly check to see if handrails are loose or broken. Make sure that both sides of any steps have handrails.  Any raised decks and porches should have guardrails on the edges.  Have any leaves, snow, or ice cleared regularly.  Use sand or salt on walking paths during winter.  Clean up any spills in your garage right away. This includes oil or grease spills. What can I do in the bathroom?  Use night lights.  Install grab bars by the toilet and in the tub and shower. Do not use towel bars as grab bars.  Use non-skid mats or decals in the tub or shower.  If you need to sit down in the shower, use a plastic, non-slip stool.  Keep the floor dry. Clean up any water that spills on the floor as soon as it happens.  Remove soap buildup in the tub or shower regularly.  Attach bath mats  securely with double-sided non-slip rug tape.  Do not have throw rugs and other things on the floor that can make you trip. What can I do in the bedroom?  Use night lights.  Make sure that you have a light by your bed that is easy to reach.  Do not use any sheets or blankets that are too big for your bed. They should not hang down onto the floor.  Have a firm chair that has side arms. You can use this for support while you get dressed.  Do not have throw rugs and other things on the floor that can make you trip. What can I do in the kitchen?  Clean up any spills right away.  Avoid walking on wet floors.  Keep items that you use a lot in easy-to-reach places.  If you need to reach something above you, use a strong step stool that has a grab bar.  Keep electrical cords out of the way.  Do not use floor polish or wax that makes floors slippery. If you must use wax, use non-skid floor wax.  Do not have throw rugs and other things on the  floor that can make you trip. What can I do with my stairs?  Do not leave any items on the stairs.  Make sure that there are handrails on both sides of the stairs and use them. Fix handrails that are broken or loose. Make sure that handrails are as long as the stairways.  Check any carpeting to make sure that it is firmly attached to the stairs. Fix any carpet that is loose or worn.  Avoid having throw rugs at the top or bottom of the stairs. If you do have throw rugs, attach them to the floor with carpet tape.  Make sure that you have a light switch at the top of the stairs and the bottom of the stairs. If you do not have them, ask someone to add them for you. What else can I do to help prevent falls?  Wear shoes that:  Do not have high heels.  Have rubber bottoms.  Are comfortable and fit you well.  Are closed at the toe. Do not wear sandals.  If you use a stepladder:  Make sure that it is fully opened. Do not climb a closed  stepladder.  Make sure that both sides of the stepladder are locked into place.  Ask someone to hold it for you, if possible.  Clearly mark and make sure that you can see:  Any grab bars or handrails.  First and last steps.  Where the edge of each step is.  Use tools that help you move around (mobility aids) if they are needed. These include:  Canes.  Walkers.  Scooters.  Crutches.  Turn on the lights when you go into a dark area. Replace any light bulbs as soon as they burn out.  Set up your furniture so you have a clear path. Avoid moving your furniture around.  If any of your floors are uneven, fix them.  If there are any pets around you, be aware of where they are.  Review your medicines with your doctor. Some medicines can make you feel dizzy. This can increase your chance of falling. Ask your doctor what other things that you can do to help prevent falls. This information is not intended to replace advice given to you by your health care provider. Make sure you discuss any questions you have with your health care provider. Document Released: 01/14/2009 Document Revised: 08/26/2015 Document Reviewed: 04/24/2014 Elsevier Interactive Patient Education  2017 [employer].

## 2019-09-08 NOTE — Patient Instructions (Addendum)
To start with a shampoo like head and shoulders if area gets worse to notify and we can send in prescription  To start flonase to both nares twice daily- 1 spray Start claritin (can use generic) 10 mg daily for sinus congestion     Urinary Incontinence  Urinary incontinence refers to a condition in which a person is unable to control where and when to pass urine. A person with this condition will urinate when he or she does not mean to (involuntarily). What are the causes? This condition may be caused by:  Medicines.  Infections.  Constipation.  Overactive bladder muscles.  Weak bladder muscles.  Weak pelvic floor muscles. These muscles provide support for the bladder, intestine, and, in women, the uterus.  Enlarged prostate in men. The prostate is a gland near the bladder. When it gets too big, it can pinch the urethra. With the urethra blocked, the bladder can weaken and lose the ability to empty properly.  Surgery.  Emotional factors, such as anxiety, stress, or post-traumatic stress disorder (PTSD).  Pelvic organ prolapse. This happens in women when organs shift out of place and into the vagina. This shift can prevent the bladder and urethra from working properly. What increases the risk? The following factors may make you more likely to develop this condition:  Older age.  Obesity and physical inactivity.  Pregnancy and childbirth.  Menopause.  Diseases that affect the nerves or spinal cord (neurological diseases).  Long-term (chronic) coughing. This can increase pressure on the bladder and pelvic floor muscles. What are the signs or symptoms? Symptoms may vary depending on the type of urinary incontinence you have. They include:  A sudden urge to urinate, but passing urine involuntarily before you can get to a bathroom (urge incontinence).  Suddenly passing urine with any activity that forces urine to pass, such as coughing, laughing, exercise, or sneezing  (stress incontinence).  Needing to urinate often, but urinating only a small amount, or constantly dribbling urine (overflow incontinence).  Urinating because you cannot get to the bathroom in time due to a physical disability, such as arthritis or injury, or communication and thinking problems, such as Alzheimer disease (functional incontinence). How is this diagnosed? This condition may be diagnosed based on:  Your medical history.  A physical exam.  Tests, such as: ? Urine tests. ? X-rays of your kidney and bladder. ? Ultrasound. ? CT scan. ? Cystoscopy. In this procedure, a health care provider inserts a tube with a light and camera (cystoscope) through the urethra and into the bladder in order to check for problems. ? Urodynamic testing. These tests assess how well the bladder, urethra, and sphincter can store and release urine. There are different types of urodynamic tests, and they vary depending on what the test is measuring. To help diagnose your condition, your health care provider may recommend that you keep a log of when you urinate and how much you urinate. How is this treated? Treatment for this condition depends on the type of incontinence that you have and its cause. Treatment may include:  Lifestyle changes, such as: ? Quitting smoking. ? Maintaining a healthy weight. ? Staying active. Try to get 150 minutes of moderate-intensity exercise every week. Ask your health care provider which activities are safe for you. ? Eating a healthy diet.  Avoid high-fat foods, like fried foods.  Avoid refined carbohydrates like white bread and white rice.  Limit how much alcohol and caffeine you drink.  Increase your fiber intake.  Foods such as fresh fruits, vegetables, beans, and whole grains are healthy sources of fiber.  Pelvic floor muscle exercises.  Bladder training, such as lengthening the amount of time between bathroom breaks, or using the bathroom at regular  intervals.  Using techniques to suppress bladder urges. This can include distraction techniques or controlled breathing exercises.  Medicines to relax the bladder muscles and prevent bladder spasms.  Medicines to help slow or prevent the growth of a man's prostate.  Botox injections. These can help relax the bladder muscles.  Using pulses of electricity to help change bladder reflexes (electrical nerve stimulation).  For women, using a medical device to prevent urine leaks. This is a small, tampon-like, disposable device that is inserted into the urethra.  Injecting collagen or carbon beads (bulking agents) into the urinary sphincter. These can help thicken tissue and close the bladder opening.  Surgery. Follow these instructions at home: Lifestyle  Limit alcohol and caffeine. These can fill your bladder quickly and irritate it.  Keep yourself clean to help prevent odors and skin damage. Ask your doctor about special skin creams and cleansers that can protect the skin from urine.  Consider wearing pads or adult diapers. Make sure to change them regularly, and always change them right after experiencing incontinence. General instructions  Take over-the-counter and prescription medicines only as told by your health care provider.  Use the bathroom about every 3-4 hours, even if you do not feel the need to urinate. Try to empty your bladder completely every time. After urinating, wait a minute. Then try to urinate again.  Make sure you are in a relaxed position while urinating.  If your incontinence is caused by nerve problems, keep a log of the medicines you take and the times you go to the bathroom.  Keep all follow-up visits as told by your health care provider. This is important. Contact a health care provider if:  You have pain that gets worse.  Your incontinence gets worse. Get help right away if:  You have a fever or chills.  You are unable to urinate.  You have  redness in your groin area or down your legs. Summary  Urinary incontinence refers to a condition in which a person is unable to control where and when to pass urine.  This condition may be caused by medicines, infection, weak bladder muscles, weak pelvic floor muscles, enlargement of the prostate (in men), or surgery.  The following factors increase your risk for developing this condition: older age, obesity, pregnancy and childbirth, menopause, neurological diseases, and chronic coughing.  There are several types of urinary incontinence. They include urge incontinence, stress incontinence, overflow incontinence, and functional incontinence.  This condition is usually treated first with lifestyle and behavioral changes, such as quitting smoking, eating a healthier diet, and doing regular pelvic floor exercises. Other treatment options include medicines, bulking agents, medical devices, electrical nerve stimulation, or surgery. This information is not intended to replace advice given to you by your health care provider. Make sure you discuss any questions you have with your health care provider. Document Revised: 03/30/2017 Document Reviewed: 06/29/2016 Elsevier Patient Education  [place].

## 2019-09-08 NOTE — Progress Notes (Signed)
Careteam: Patient Care Team: Gabrielle Chandler, NP as PCP - General (Geriatric Medicine)  PLACE OF SERVICE:  [place] Directive information Does Patient Have a Medical Advance Directive?: Yes, Type of Advance Directive: [place];Living will, Does patient want to make changes to medical advance directive?: No - Patient declined  Allergies  Allergen Reactions  . Cafergot   . Codeine   . Fenoprofen Calcium   . Nalfon [Fenoprofen]     Chief Complaint  Patient presents with  . Medical Management of Chronic Issues    6 month follow-up   . Bladder Issues    Per patient leaky bladder getting worse.   Marland Kitchen Headache    Patient c/o headaches off/on on right side of head   . Hair/Scalp Problem    Examine scalp for scabs, question if referral to dermatologist needed  . Numbness    Numbness in feet   . Leg Swelling    Leg(ankle) swelling   . Nose Problem    Left side of nose stopped up  . Tachycardia    Patient c/o heart beating fast off/on   . Medication Management    Patient started prednisone on her own from an old rx that Dr.Wert prescribed.    HPI: Patient is a 74 y.o. female for routine follow up.  Been having up concerns.   Pt with Overactive bladder and leaking- worse at night but will happen any time. Sometimes will go to the bathroom and then have to turn around and go in other times can go hours. Does not want to try new medication for this. Has had leakage for a long time but worse in the last 2 weeks.   Headaches- hx of migraines but when thyroid was removed this improved. Now having a pain over the right eye (where she had her migraine)  Been ongoing for a few months. Once every 2 weeks, last 5 minutes and then resolves. No blurred vision, dizziness, weakness or other symptoms associated with headache.   Hearing loss- planning to see audiologist, left ear feels stopped up.   Ankles and feet swell frequently. Does not pay that close  attention to it. Toes and foot will hurt occasionally.   Small raised area on scalp. Not itching, just aware that it is there.   Feel on her nose last year and since has been more stopped up since. Still able to breath out of it. Uses saline which helps occasionally.   Reports that she feels like she is having palpitations, not new has chronic but more often recently- having a lot of stress with selling her house so contributes it to that.  Notices when she lays down on her side.  Worse in the last week. Her breathing has not been doing as well and has been using albuterol more. Went for a long time and never needed it but the humid weather has not been good for her COPD. Currently taking prednisone.   Review of Systems:  Review of Systems  Constitutional: Positive for malaise/fatigue. Negative for chills and fever.  HENT: Positive for congestion. Negative for ear pain, sinus pain and sore throat.   Respiratory: Positive for cough, shortness of breath and wheezing.   Cardiovascular: Positive for palpitations. Negative for chest pain and leg swelling.  Gastrointestinal: Negative for abdominal pain, constipation, diarrhea and heartburn.  Genitourinary: Positive for frequency (with incontience ).  Musculoskeletal: Negative for falls.  Skin: Positive for rash (on scalp). Negative  for itching.  Neurological: Positive for tingling, sensory change and headaches (occasionally above right eye). Negative for dizziness.    Past Medical History:  Diagnosis Date  . Chronic airway obstruction, not elsewhere classified   . Depressive disorder   . Depressive disorder, not elsewhere classified   . Emphysema   . Enthesopathy of ankle and tarsus, unspecified   . HTN (hypertension)   . Hyperlipidemia   . Hypertension   . Hypopotassemia   . Leukocytosis   . Migraine, unspecified, without mention of intractable migraine without mention of status migrainosus   . Nonspecific (abnormal) findings on  radiological and other examination of skull and head   . Osteoarthrosis, unspecified whether generalized or localized, unspecified site   . Other emphysema ([place])   . Other specified disease of white blood cells   . Palpitations   . [facility] spotted fever) 08/23/2015   Positive IgM. Treated with doxycycline.   . Tobacco abuse    Past Surgical History:  Procedure Laterality Date  . CESAREAN SECTION    . COLONOSCOPY  08/14/2008   Dr.. Delfin Edis   Social History:   reports that she has been smoking cigarettes. She has smoked for the past 50.00 years. She has never used smokeless tobacco. She reports that she does not drink alcohol or use drugs.  Family History  Problem Relation Age of Onset  . Alzheimer's disease Father   . Heart disease Father   . Diabetes Father   . Skin cancer Father   . Heart disease Mother   . Breast cancer Mother     Medications: Patient's Medications  New Prescriptions   No medications on file  Previous Medications   ACETAMINOPHEN (TYLENOL) 500 MG TABLET    Take 500 mg by mouth every 6 (six) hours as needed for mild pain or headache.   ALBUTEROL (VENTOLIN HFA) 108 (90 BASE) MCG/ACT INHALER    Inhale 2 puffs into the lungs every 6 (six) hours as needed for wheezing or shortness of breath.   ALPRAZOLAM (XANAX) 0.5 MG TABLET    Take 0.5 mg by mouth 2 (two) times daily as needed for anxiety.   ATORVASTATIN (LIPITOR) 40 MG TABLET    TAKE 1 TABLET BY MOUTH DAILY FOR HIGH CHOLESTEROL   BUDESON-GLYCOPYRROL-FORMOTEROL (BREZTRI AEROSPHERE) 160-9-4.8 MCG/ACT AERO    Inhale 2 puffs into the lungs 2 (two) times daily.   CHOLECALCIFEROL (VITAMIN D3) 1000 UNITS CAPS    Take 1 capsule by mouth daily.     FAMOTIDINE (PEPCID) 20 MG TABLET    One after supper   LEVOTHYROXINE (SYNTHROID) 125 MCG TABLET    Take 1 tablet (125 mcg total) by mouth daily.   LOSARTAN (COZAAR) 50 MG TABLET    Take 50 mg by mouth daily.   PANTOPRAZOLE (PROTONIX) 40 MG TABLET    Take 1  tablet (40 mg total) by mouth daily before breakfast.   PREDNISONE (DELTASONE) 1 MG TABLET    Taper pack   SERTRALINE (ZOLOFT) 100 MG TABLET    Take 1 tablet (100 mg total) by mouth at bedtime.  Modified Medications   No medications on file  Discontinued Medications   No medications on file    Physical Exam:  Vitals:   09/08/19 1421  BP: 126/70  Pulse: 82  Temp: (!) 97.1 F (36.2 C)  TempSrc: Temporal  SpO2: 95%  Weight: 173 lb (78.5 kg)  Height: 5\' 5"  (1.651 m)   Body mass index is 28.79 kg/m.  Wt Readings from Last 3 Encounters:  09/08/19 173 lb (78.5 kg)  09/08/19 173 lb (78.5 kg)  08/14/19 168 lb (76.2 kg)    Physical Exam Constitutional:      General: She is not in acute distress.    Appearance: She is well-developed. She is not ill-appearing, toxic-appearing or diaphoretic.  HENT:     Head: Normocephalic and atraumatic.     Nose: Congestion present.     Right Turbinates: Swollen.     Mouth/Throat:     Mouth: Mucous membranes are moist.     Pharynx: Oropharynx is clear.  Eyes:     General: No visual field deficit.    Extraocular Movements: Extraocular movements intact.     Pupils: Pupils are equal, round, and reactive to light.  Cardiovascular:     Rate and Rhythm: Normal rate and regular rhythm.     Heart sounds: Normal heart sounds.  Pulmonary:     Effort: Pulmonary effort is normal.     Breath sounds: Wheezing (throughout) present.  Abdominal:     General: Bowel sounds are normal.     Palpations: Abdomen is soft.  Musculoskeletal:        General: No swelling or tenderness. Normal range of motion.     Cervical back: Normal range of motion and neck supple.     Right lower leg: No edema.     Left lower leg: No edema.  Skin:    General: Skin is warm and dry.  Neurological:     Mental Status: She is alert and oriented to person, place, and time.     Cranial Nerves: No cranial nerve deficit, dysarthria or facial asymmetry.     Sensory: No sensory  deficit.     Motor: No weakness.     Deep Tendon Reflexes: Reflexes normal.  Psychiatric:        Mood and Affect: Mood normal.        Behavior: Behavior normal.     Labs reviewed: Basic Metabolic Panel: Recent Labs    11/22/18 1521 11/22/18 1521 06/09/19 1105 06/10/19 0805 06/12/19 0444 06/12/19 0444 06/14/19 0536 06/20/19 1957 08/06/19 1519  NA 141   < > 140   < > 142   < > 139 141 140  K 4.3   < > 4.0   < > 4.1   < > 4.1 4.1 4.2  CL 102   < > 103   < > 104   < > 101 104 106  CO2 29   < > 30   < > 28   < > 30 28 25   GLUCOSE 90   < > 121*   < > 100*   < > 142* 112* 89  BUN 11   < > 15   < > 19   < > 23 20 19   CREATININE 0.79  --  0.76   < > 0.64   < > 0.61 0.97 0.75  CALCIUM 9.6   < > 9.4   < > 8.8*   < > 8.7* 8.6* 9.6  MG  --   --  2.1  --  2.2  --  2.3  --   --   PHOS  --   --  4.0  --   --   --   --   --   --   TSH 3.08  --   --   --   --   --   --   --   --    < > =  values in this interval not displayed.   Liver Function Tests: Recent Labs    11/22/18 1521 06/09/19 1105  AST 12 17  ALT 11 21  ALKPHOS  --  118  BILITOT 0.5 0.8  PROT 6.7 7.5  ALBUMIN  --  4.2   No results for input(s): LIPASE, AMYLASE in the last 8760 hours. No results for input(s): AMMONIA in the last 8760 hours. CBC: Recent Labs    11/22/18 1521 11/22/18 1521 06/09/19 1105 06/10/19 0805 06/14/19 0536 06/20/19 1957 08/06/19 1519  WBC 9.6   < > 11.3*   < > 11.3* 19.9* 13.1*  NEUTROABS 6,230  --  7.6  --   --   --  8,567*  HGB 15.4   < > 15.6*   < > 13.9 14.4 13.7  HCT 46.2*   < > 49.8*   < > 44.0 44.3 41.7  MCV 92.6   < > 97.5   < > 97.1 95.9 93.9  PLT 283   < > 322   < > 286 300 289   < > = values in this interval not displayed.   Lipid Panel: Recent Labs    11/22/18 1521  CHOL 171  HDL 55  LDLCALC 93  TRIG 136  CHOLHDL 3.1   TSH: Recent Labs    11/22/18 1521  TSH 3.08   A1C: Lab Results  Component Value Date   HGBA1C 5.3 08/06/2019     Assessment/Plan 1.  Seborrheic dermatitis of scalp -small area, encouraged use of OTC shampoo, if persist or worsen to notify for Rx  2. Sinus congestion -encouraged use of flonase to bilateral nares routinely, could also be contributing to headache. Also to take OTC claritin or zyrtec 10 mg daily  3. COPD exacerbation ([place]) Continues on prednisone per pulmonary and albuterol PRN. May need antibiotic if symptoms not improving or worsening.   4. Hypothyroidism, unspecified type -continues on synthroid 125 mcg - TSH  5. Overactive bladder -ongoing, encouraged scheduled bathroom trips. Kegel exercises.  Would not like to add medication at this time  6. Essential hypertension -stable on losartan 50 mg daily - COMPLETE METABOLIC PANEL WITH GFR - CBC with Differential/Platelet  7. Cyst, colloid, third ventricle ([place]) Last CT 11/2018 stable, continues to be followed by neuro.  Next appt: 3 months, sooner if needed  Gabrielle White, [facility] [phone]

## 2019-09-09 LAB — CBC WITH DIFFERENTIAL/PLATELET
Absolute Monocytes: 1002 cells/uL — ABNORMAL HIGH (ref 200–950)
Basophils Absolute: 127 cells/uL (ref 0–200)
Basophils Relative: 0.8 %
Eosinophils Absolute: 270 cells/uL (ref 15–500)
Eosinophils Relative: 1.7 %
HCT: 42.4 % (ref 35.0–45.0)
Hemoglobin: 14 g/dL (ref 11.7–15.5)
Lymphs Abs: 4913 cells/uL — ABNORMAL HIGH (ref 850–3900)
MCH: 30.8 pg (ref 27.0–33.0)
MCHC: 33 g/dL (ref 32.0–36.0)
MCV: 93.2 fL (ref 80.0–100.0)
MPV: 10.9 fL (ref 7.5–12.5)
Monocytes Relative: 6.3 %
Neutro Abs: 9588 cells/uL — ABNORMAL HIGH (ref 1500–7800)
Neutrophils Relative %: 60.3 %
Platelets: 339 10*3/uL (ref 140–400)
RBC: 4.55 10*6/uL (ref 3.80–5.10)
RDW: 12.5 % (ref 11.0–15.0)
Total Lymphocyte: 30.9 %
WBC: 15.9 10*3/uL — ABNORMAL HIGH (ref 3.8–10.8)

## 2019-09-09 LAB — TSH: TSH: 0.69 mIU/L (ref 0.40–4.50)

## 2019-09-09 LAB — COMPLETE METABOLIC PANEL WITH GFR
AG Ratio: 1.6 (calc) (ref 1.0–2.5)
ALT: 12 U/L (ref 6–29)
AST: 11 U/L (ref 10–35)
Albumin: 3.9 g/dL (ref 3.6–5.1)
Alkaline phosphatase (APISO): 90 U/L (ref 37–153)
BUN/Creatinine Ratio: 20 (calc) (ref 6–22)
BUN: 19 mg/dL (ref 7–25)
CO2: 33 mmol/L — ABNORMAL HIGH (ref 20–32)
Calcium: 9.3 mg/dL (ref 8.6–10.4)
Chloride: 104 mmol/L (ref 98–110)
Creat: 0.95 mg/dL — ABNORMAL HIGH (ref 0.60–0.93)
GFR, Est African American: 68 mL/min/{1.73_m2} (ref >=60)
GFR, Est Non African American: 59 mL/min/{1.73_m2} — ABNORMAL LOW (ref >=60)
Globulin: 2.5 g/dL (calc) (ref 1.9–3.7)
Glucose, Bld: 92 mg/dL (ref 65–139)
Potassium: 3.6 mmol/L (ref 3.5–5.3)
Sodium: 143 mmol/L (ref 135–146)
Total Bilirubin: 0.4 mg/dL (ref 0.2–1.2)
Total Protein: 6.4 g/dL (ref 6.1–8.1)

## 2019-09-10 ENCOUNTER — Telehealth: Payer: Self-pay | Admitting: *Deleted

## 2019-09-10 DIAGNOSIS — J441 Chronic obstructive pulmonary disease with (acute) exacerbation: Secondary | ICD-10-CM

## 2019-09-10 MED ORDER — SULFAMETHOXAZOLE-TRIMETHOPRIM 800-160 MG PO TABS: 1.0000 | ORAL_TABLET | Freq: Two times a day (BID) | ORAL | 0 refills | Status: DC

## 2019-09-10 NOTE — Telephone Encounter (Signed)
Spoke with pt, gave lab results and will get chest xray, bactrim DS called into pharmacy (she was on doxycyline 1 month ago for skin issue). mucinex DM by mouth twice daily with full glass of water. Encouraged to increase hydration with water vs soda and tea. Plan to follow up in office in 1 week

## 2019-09-10 NOTE — Telephone Encounter (Signed)
Patient called and stated that she was recently seen by Janett Billow. Patient stated that her cough is deeper, scratchy throat and congestion (whitish in color). No fever.  Patient is thinking she needs an antibiotic.  Please Advise.

## 2019-09-15 ENCOUNTER — Ambulatory Visit: Payer: Medicare HMO | Admitting: Family

## 2019-09-16 ENCOUNTER — Other Ambulatory Visit: Payer: Self-pay

## 2019-09-16 ENCOUNTER — Encounter: Payer: Medicare HMO | Admitting: Family

## 2019-09-16 ENCOUNTER — Encounter: Payer: Self-pay | Admitting: Family

## 2019-09-16 ENCOUNTER — Ambulatory Visit: Payer: Medicare HMO | Admitting: Family

## 2019-09-16 ENCOUNTER — Ambulatory Visit
Admission: RE | Admit: 2019-09-16 | Discharge: 2019-09-16 | Disposition: A | Discharge status: Home / Self Care | Payer: Medicare HMO | Source: Ambulatory Visit | Attending: Nurse Practitioner | Admitting: Nurse Practitioner

## 2019-09-16 DIAGNOSIS — R05 Cough: Secondary | ICD-10-CM | POA: Diagnosis not present

## 2019-09-16 DIAGNOSIS — R0602 Shortness of breath: Secondary | ICD-10-CM | POA: Diagnosis not present

## 2019-09-16 DIAGNOSIS — J449 Chronic obstructive pulmonary disease, unspecified: Secondary | ICD-10-CM | POA: Diagnosis not present

## 2019-09-16 DIAGNOSIS — J441 Chronic obstructive pulmonary disease with (acute) exacerbation: Secondary | ICD-10-CM

## 2019-09-16 MED ORDER — ALBUTEROL SULFATE HFA 108 (90 BASE) MCG/ACT IN AERS: 2.0000 | INHALATION_SPRAY | Freq: Four times a day (QID) | RESPIRATORY_TRACT | 5 refills | Status: DC | PRN

## 2019-09-17 ENCOUNTER — Other Ambulatory Visit: Payer: Self-pay | Admitting: Nurse Practitioner

## 2019-09-17 MED ORDER — LEVOFLOXACIN 750 MG PO TABS
750.0000 mg | ORAL_TABLET | Freq: Every day | ORAL | 0 refills | Status: DC
Start: 2019-09-17 — End: 2019-09-26

## 2019-09-22 ENCOUNTER — Ambulatory Visit: Payer: Medicare HMO | Admitting: Internal Medicine

## 2019-09-23 NOTE — Progress Notes (Signed)
This encounter was created in error - please disregard.

## 2019-09-26 ENCOUNTER — Other Ambulatory Visit: Payer: Self-pay | Admitting: Internal Medicine

## 2019-09-26 ENCOUNTER — Ambulatory Visit (HOSPITAL_COMMUNITY)
Admission: RE | Admit: 2019-09-26 | Discharge: 2019-09-26 | Disposition: A | Discharge status: Home / Self Care | Payer: Medicare HMO | Source: Ambulatory Visit | Attending: Cardiology | Admitting: Cardiology

## 2019-09-26 ENCOUNTER — Ambulatory Visit: Payer: Medicare HMO | Admitting: Internal Medicine

## 2019-09-26 ENCOUNTER — Ambulatory Visit (INDEPENDENT_AMBULATORY_CARE_PROVIDER_SITE_OTHER): Payer: Medicare HMO

## 2019-09-26 ENCOUNTER — Encounter: Payer: Self-pay | Admitting: Internal Medicine

## 2019-09-26 ENCOUNTER — Other Ambulatory Visit: Payer: Self-pay

## 2019-09-26 DIAGNOSIS — F17210 Nicotine dependence, cigarettes, uncomplicated: Secondary | ICD-10-CM

## 2019-09-26 DIAGNOSIS — M7989 Other specified soft tissue disorders: Secondary | ICD-10-CM

## 2019-09-26 DIAGNOSIS — R05 Cough: Secondary | ICD-10-CM | POA: Diagnosis not present

## 2019-09-26 DIAGNOSIS — J449 Chronic obstructive pulmonary disease, unspecified: Secondary | ICD-10-CM

## 2019-09-26 DIAGNOSIS — M25562 Pain in left knee: Secondary | ICD-10-CM

## 2019-09-26 DIAGNOSIS — J9611 Chronic respiratory failure with hypoxia: Secondary | ICD-10-CM

## 2019-09-26 DIAGNOSIS — M25561 Pain in right knee: Secondary | ICD-10-CM

## 2019-09-26 MED ORDER — BREZTRI AEROSPHERE 160-9-4.8 MCG/ACT IN AERO: 2.0000 | INHALATION_SPRAY | Freq: Two times a day (BID) | RESPIRATORY_TRACT | 0 refills | Status: DC

## 2019-09-26 NOTE — Progress Notes (Signed)
Subjective:   Patient ID: Gabrielle White, female    DOB: 1945/04/15 .   MRN: [id number]    Brief patient profile:  74  yowf  MM/ active smoker with AB/GOLD 0 copd by pfts 03/08/15 and GOLD II criteria 11/28/2017     History of Present Illness  01/22/2015 acute extended re-establish ov/Gabrielle White re:  AB/ still smoking  Chief Complaint  Patient presents with  . Pulmonary Consult    pt last seen in 2014. pt states DR. Green wanted her to follow up. pt states somethines her throat feels like it closes up and she cant breath. pt states she feels like she has to take really deep breaths. pt c/o chest tightness, and prod cough white in color.pt c/o thrush she thiks may come from the inhailers.     Last better breathing  x 4 weeks prior to OV  p using prednisone but benefit only for a few days p stopped despite maint rx with symbicort (though hfa poor)  Nl routine is symbicort 160 2 bid and maybe ventolin and occ brovana no recent duoneb need  In retrospect really hasn't been able to really stay well x 4 y Cough is harsh and congested worse day than noct  rec Start Prednisone 10mg  Take 4 for two days three for two days two for two days one for two days  Plan A =  Automatic = symbicort 160 Take 2 puffs first thing in am and then another 2 puffs about 12 hours later.  Work on Interior and spatial designer: Plan B = Backup  Only use your albuterol  Plan C = crisis  Only use nebulizer iprotropium-albuterol (duoneb) if you try the ventolin first and it fails to help > ok to use up to every 4 hours  Plan D = doctor call us if not improving  Plan E = ER > go there if all else fails Try prilosec otc 20mg   Take 30-60 min before first meal of the day and Pepcid ac (famotidine) 20 mg one @  bedtime    GERD (REFLUX) diet             01/24/2019  f/u ov/Gabrielle White re: GOLD II/ still smoking maint rx symb 160 2bid  Chief Complaint  Patient presents with  . Follow-up    Breathing may be slightly better  since the last visit- relates to cooler weather- using proair 2-3 x per day.   Dyspnea:  MMRC2 = can't walk a nl pace on a flat grade s sob but does fine slow and flat but by hour 10 wearing off symb and needs albuterol typically prior to pm dose of symb Cough: spells sporacic, white daytime mostly  Sleeping: sleeping on enough pillows to get 30 degrees  SABA use: as above  rec Plan A = Automatic = Always=    Breztri Take 2 puffs first thing in am and then another 2 puffs about 12 hours later.  Work on inhaler technique:    Plan B = Backup (to supplement plan A, not to replace it) Only use your albuterol inhaler as a rescue medication   Prednisone 10 mg take  4 each am x 2 days,   2 each am x 2 days,  1 each am x 2 days and stop  Please schedule a follow up office visit in 6 months, call sooner if needed     02/21/2019  f/u ov/Gabrielle White re: GOLD II/ still smoking/ problems with access to affordable  meds / does not have pred refillable as Plan C Chief Complaint  Patient presents with  . Follow-up    Patient is here for follow up for COPD. Insurance denied [employer].  Dyspnea:  Walking end of road and back now x 15-20 min s stopping / slow pace = MMRC2 = can't walk a nl pace on a flat grade s sob but does fine slow and flat  Cough: some sporadic esp in am / minimal mucoid sputum   Sleeping: no resp problems SABA use: avg twice daily at most, less while on breztri rec Plan A = Automatic = Always=    Breztri = Bevespi (one or the other)  Take 2 puffs first thing in am and then another 2 puffs about 12 hours later.  Plan B = Backup (to supplement plan A, not to replace it) Only use your albuterol inhaler  Add Plan C: crisis If not improving or needing more albuterol than usual : Prednisone 10 mg take  4 each am x 2 days,   2 each am x 2 days,  1 each am x 2 days and stop  The key is to stop smoking completely before smoking completely stops you!   03/17/2019  f/u ov/Gabrielle White re:  GOLD II copd/ still  smoking/ anoro and stiolto approved by [facility] but [facility] her choice  will be available Apr 04 2019  And using symb 160  2bid Chief Complaint  Patient presents with  . Follow-up    Pt states she had a COPD exacerbation 5 nights ago after she believes she might have taken too much anoro. Since the exacerbation, pt stated she felt like a "zombie" but now pt said she is getting better. Pt has occ cough with white to yellow phlegm.  Dyspnea:  No longer doing any outdoor walking  Cough: none  Sleeping: no resp symptoms  SABA use: once or twice daily  02: none  rec Plan A = Automatic = Always=    Breztri Take 2 puffs first thing in am and then another 2 puffs about 12 hours later.  Work on inhaler technique:   Plan B = Backup (to supplement plan A, not to replace it) Only use your albuterol inhaler (proair)  as a rescue medication  Plan C = Crisis (instead of Plan B but only if Plan B stops working) - Prednisone 10 mg take  4 each am x 2 days,   2 each am x 2 days,  1 each am x 2 days and stop    06/09/2019  f/u ov/Gabrielle White re:  GOLD II copd/ still smoking / breztri  Chief Complaint  Patient presents with  . Follow-up    Increased SOB x 3 days. She has been coughing more over the past couple of days- minimal clear sputum.   Dyspnea: very sedentary this winter / now acutely sob with min activity In setting of severe congested cough but only minimal white mucus prod Sleeping: baseline fine, only 2 hours night prior to ov due sob  SABA use: 3-4 x per week then acutely increased x  3 days prior to OV   02: none Did not try prednisone as "plan C" since getting covid shot 06/11/19 and didn't want to interfere with that rec Go directly to [place] [facility] for evaluation of new dx of respiratory failure/ copd exacerbation    Admit date: 06/09/2019 Discharge date: 06/15/2019   Principal Problem:   Acute respiratory failure with hypoxemia  Cigarette smoker   Hyperlipidemia    Hypothyroidism   Hyperglycemia   Essential hypertension   Depression   Polycythemia   COPD exacerbation (HCC)   History of Present Illness:   Patient is 74 year old female with history of COPD/emphysema, anxiety, depression, insomnia, hypertension, hyperlipidemia, tobacco abuse who presented to the ED from her pulmonologist office (Dr. Melvyn Novas) due to progressively worsening dyspnea for 4 to 5 days with associated wheezing without responding to albuterol at home. Patient also had increased sputum production. ED Course:Initial vital signs temperature 98.9 F, pulse 92, respirations 22, blood pressure 132/81 mmHg and O2 sat 91% on room air. The patient was given bronchodilator and 60 mg of prednisone. She became hypoxic and tachypneic with ambulation, so our service was asked to evaluate for further treatment. CBC shows white count 11.3, hemoglobin 15.6 g/dL and platelets 322. PT 12.3 and INR 0.9. Venous blood gas showed a decreased PO2 of 48.30 mmHg and bicarbonate of 29.7 mmol/L. CMP showed a glucose of 121 mg/dL, but was otherwise normal. Lactic acid was 1.1 mmol/L. SARS coronavirus 2, influenza A and B by PCR was negative. Her chest radiograph did not have any acute cardiopulmonary disease, but showed hyperinflation compatible with emphysema.  Hospital Course:   Following conditions were addressed during hospitalization as listed below,  Acute hypoxic respiratory failure secondary to COPD exacerbation.Likely secondary to viral illness and ongoing smoking.. Patient was seen by pulmonary during hospitalization.. Patient likely has gold stage II COPD. Pulmonary has an impression that patient has been advancing in her COPD due to continued smoking as evidenced by declining FEV1. Patient continues to smoke until just recently. Patient was  optimized on bronchodilators steroids but persisted to need 3 L of oxygen.  Patient will need oxygen on discharge.  Patient was emphasized the  need for quitting smoking, follow-up with your pulmonary physician and will consider ambulatory pulmonary rehab follow-up.  Continue p.o. prednisone taper on discharge.  Essential hypertensionon losartan. Continue. Closely monitor  Polycythemia secondary to COPD.  Hyperlipidemiaon atorvastatin  Hypothyroidismon levothyroxine  Depression on sertraline  GERD Continue Protonix    08/14/2019  Post hospf/u ov/Gabrielle White re: not smoking / Breztri maint  Chief Complaint  Patient presents with  . Follow-up    pt states in hospital for week.pt uses rescue haler2 a week. pt states uses 3l of 02  Dyspnea:  Sitting usually fine / also supine / steps are a problem but ok slow flat = MMRC2 = can't walk a nl pace on a flat grade s sob but does fine slow and flat   Cough: still some rattling but much less am mucus production  Sleeping: bed is flat, pillows to 30 degrees SABA use: sev times  A day  02:  No longer using due to electric bill went up on it  rec Plan A = Automatic = Always=    Breztri Take 2 puffs first thing in am and then another 2 puffs about 12 hours later.  Work on Orthoptist B = Backup (to supplement plan A, not to replace it) Only use your albuterol inhaler as a rescue medication Ok to try albuterol 15 min before an activity t For cough /congestion > mucinex dm  1200 mg every 12 hours and use flutter valve as much as you can Pantoprazole (protonix) 40 mg   Take  30-60 min before first meal of the day and Pepcid (famotidine)  20 mg one after supper until return to office - this is the  best way to tell whether stomach acid is contributing to your problem.   GERD  rx Make sure you check your oxygen saturations at highest level of activity   Please schedule a follow up office visit in 4 weeks, sooner if needed  with all medications /inhalers/ solutions in hand so we can verify exactly what you are taking. This includes all medications from all doctors and over  the counters   09/16/19  Cough/congestion/  2 different abx could not finish bactrim due to gi issues  then levaquin rx  09/26/2019  f/u ov/Gabrielle White re: GOLD II still smoking / new leg swelling  Chief Complaint  Patient presents with  . Follow-up    Breathing is unchanged since the last visit. She is using her albuterol inhaler 3 x per day.    Dyspnea:  MMRC2 = can't walk a nl pace on a flat grade s sob but does fine slow and flat  Cough: sometimes after supper but not during the day/ nothing purulent  Sleeping: bed is flat / 30 degrees with pillows  SABA use:  Twice daily avg / up to 3 x daily at her worst  02: wants to take back today but hasn't checked sats yet    No obvious day to day or daytime variability or assoc   purulent sputum or mucus plugs or hemoptysis or cp or chest tightness, subjective wheeze or overt sinus or hb symptoms.   Sleeping ok without nocturnal  or early am exacerbation  of respiratory  c/o's or need for noct saba. Also denies any obvious fluctuation of symptoms with weather or environmental changes or other aggravating or alleviating factors except as outlined above   No unusual exposure hx or h/o childhood pna/ asthma or knowledge of premature birth.  Current Allergies, Complete Past Medical History, Past Surgical History, Family History, and Social History were reviewed in [employer] record.  ROS  The following are not active complaints unless bolded Hoarseness, sore throat, dysphagia, dental problems, itching, sneezing,  nasal congestion or discharge of excess mucus or purulent secretions, ear ache,   fever, chills, sweats, unintended wt loss or wt gain, classically pleuritic or exertional cp,  orthopnea pnd or arm/hand swelling  or leg swelling, presyncope, palpitations, abdominal pain, anorexia, nausea, vomiting, diarrhea  or change in bowel habits or change in bladder habits, change in stools or change in urine, dysuria, hematuria,  rash,  arthralgias L knee/ L shoulder, visual complaints, headache, numbness, weakness or ataxia or problems with walking or coordination,  change in mood or  memory.        Current Meds  Medication Sig  . acetaminophen (TYLENOL) 500 MG tablet Take 500 mg by mouth every 6 (six) hours as needed for mild pain or headache.  . albuterol (VENTOLIN HFA) 108 (90 Base) MCG/ACT inhaler Inhale 2 puffs into the lungs every 6 (six) hours as needed for wheezing or shortness of breath.  . ALPRAZolam (XANAX) 0.5 MG tablet Take 0.5 mg by mouth 2 (two) times daily as needed for anxiety.  Marland Kitchen atorvastatin (LIPITOR) 40 MG tablet TAKE 1 TABLET BY MOUTH DAILY FOR HIGH CHOLESTEROL  . Budeson-Glycopyrrol-Formoterol (BREZTRI AEROSPHERE) 160-9-4.8 MCG/ACT AERO Inhale 2 puffs into the lungs 2 (two) times daily.  . Cholecalciferol (VITAMIN D3) 1000 UNITS CAPS Take 1 capsule by mouth daily.    . famotidine (PEPCID) 20 MG tablet One after supper  . levothyroxine (SYNTHROID) 125 MCG tablet Take 1 tablet (125 mcg total) by  mouth daily.  Marland Kitchen losartan (COZAAR) 50 MG tablet Take 50 mg by mouth daily.  . pantoprazole (PROTONIX) 40 MG tablet Take 1 tablet (40 mg total) by mouth daily before breakfast.  . sertraline (ZOLOFT) 100 MG tablet Take 1 tablet (100 mg total) by mouth at bedtime.                 Objective:   Physical Exam   08/14/2019     168 06/09/2019       157  03/17/2019   157 02/21/2019   154  01/24/2019   156  12/11/2018       157  12/20/12 157        Vital signs reviewed  09/26/2019  - Note at rest 02 sats  96% on RA   HEENT : pt wearing mask not removed for exam due to covid - 19 concerns.    NECK :  without JVD/Nodes/TM/ nl carotid upstrokes bilaterally   LUNGS: no acc muscle use,  Mild barrel  contour chest wall with bilateral junky exp rhonchi  and  without cough on insp or exp maneuvers  and mild  Hyperresonant  to  percussion bilaterally     CV:  RRR  no s3 or murmur or increase in P2, and  Trace L >  R pitting LEDedema   ABD:  soft and nontender with pos end  insp Hoover's  in the supine position. No bruits or organomegaly appreciated, bowel sounds nl  MS:   Nl gait/  ext warm without deformities, calf tenderness, cyanosis or clubbing Can't extend R arm above 75 degrees, fullness behind L kness ? Baker's cyst   SKIN: warm and dry without lesions    NEURO:  alert, approp, nl sensorium with  no motor or cerebellar deficits apparent.           CXR PA and Lateral:   09/26/2019 :    I personally reviewed images and agree with radiology impression as follows:     No active cardiopulmonary disease  Labs ordered/ reviewed:      Chemistry      Component Value Date/Time   NA 143 09/08/2019 1534   NA 141 08/27/2015 0821   K 3.6 09/08/2019 1534   CL 104 09/08/2019 1534   CO2 33 (H) 09/08/2019 1534   BUN 19 09/08/2019 1534   BUN 12 08/27/2015 0821   CREATININE 0.95 (H) 09/08/2019 1534      Component Value Date/Time   CALCIUM 9.3 09/08/2019 1534   ALKPHOS 118 06/09/2019 1105   AST 11 09/08/2019 1534   ALT 12 09/08/2019 1534   BILITOT 0.4 09/08/2019 1534   BILITOT 0.4 08/27/2015 0821        Lab Results  Component Value Date   WBC 15.9 (H) 09/08/2019   HGB 14.0 09/08/2019   HCT 42.4 09/08/2019   MCV 93.2 09/08/2019   PLT 339 09/08/2019       EOS                                                               270  09/08/2019      Lab Results  Component Value Date   TSH 0.69 09/08/2019                Assessment & Plan:

## 2019-09-26 NOTE — Progress Notes (Signed)
Spoke with pt and notified of results per Dr. Wert. Pt verbalized understanding and denied any questions. Referral made

## 2019-09-26 NOTE — Patient Instructions (Addendum)
No change in medications - call if your drugstore runs out of [place] and I will call the company rep to fix it.  For cough/ congestion:  mucinex or mucinex dm up to 1200 mg every 12 hours as needed  The key is to stop smoking completely before smoking completely stops you!        Make sure you check your oxygen saturations at highest level of activity to be sure it stays over 90% and adjust upward to maintain this level if needed but remember to turn it back to previous settings when you stop (to conserve your supply).   We will arrange to have a venous doppler done asap to check for clots and baker's cyst  Please remember to go to the  x-ray department  for your tests - we will call you with the results when they are available    Please schedule a follow up office visit in 6 weeks, call sooner if needed

## 2019-09-26 NOTE — Assessment & Plan Note (Signed)
Placed on 02 at d/c from admit 06/15/19  -  08/14/2019 desats on RA walking corrected with 3lpm POC > ordered       Adequate control on present rx, reviewed in detail with pt > no change in rx needed           Each maintenance medication was reviewed in detail including emphasizing most importantly the difference between maintenance and prns and under what circumstances the prns are to be triggered using an action plan format where appropriate.  Total time for H and P, chart review, counseling, teaching device/ directly observing portions of ambulatory 02 saturation study/  and generating customized AVS unique to this office visit / charting = 30 min for this post hosp f/u

## 2019-09-28 ENCOUNTER — Encounter: Payer: Self-pay | Admitting: Internal Medicine

## 2019-09-28 MED ORDER — PREDNISONE 10 MG PO TABS: ORAL_TABLET | ORAL | 11 refills | Status: DC

## 2019-09-28 NOTE — Assessment & Plan Note (Addendum)
Venous dopplers 09/26/2019 >>> neg for dvt/ baker's cyst  >>>  Referred to ortho for L knee and shoulder pain          Medical decision making was a moderate level of complexity in this case because of  two chronic conditions /diagnoses requiring extra time for  H and P, chart review, counseling, 30 min *  and generating customized AVS unique to this office visit and charting.   Each maintenance medication was reviewed in detail including emphasizing most importantly the difference between maintenance and prns and under what circumstances the prns are to be triggered using an action plan format where appropriate. Please see avs for details which were reviewed in writing by both me and my nurse and patient given a written copy highlighted where appropriate with yellow highlighter for the patient's continued care at home along with an updated version of their medications.  Patient was asked to maintain medication reconciliation by comparing this list to the actual medications being used at home and to contact this office right away if there is a conflict or discrepancy.

## 2019-09-28 NOTE — Assessment & Plan Note (Signed)
4-5 min discussion re active cigarette smoking in addition to office E&M  Ask about tobacco use:   Ongoing  Advise quitting   I took an extended  opportunity with this patient to outline the consequences of continued cigarette use  in airway disorders based on all the data we have from the multiple national lung health studies (perfomed over decades at millions of dollars in cost)  indicating that smoking cessation, not choice of inhalers or physicians, is the most important aspect of her care.   Assess willingness:  Not committed at this point Assist in quit attempt:  Per PCP when ready Arrange follow up:   Follow up per Primary Care planned

## 2019-09-28 NOTE — Assessment & Plan Note (Addendum)
Placed on 02 at d/c from admit 06/15/19  -  08/14/2019 desats on RA walking corrected with 3lpm POC > ordered   No longer using 02 as of 09/26/2019 >  rec she recheck 02 sats with activity before turning it back in   Specifically Make sure you check your oxygen saturations at highest level of desired activity to be sure it stays over 90% and adjust upward to maintain this level if needed but remember to turn it back to previous settings when you stop (to conserve your supply). If walk at slower pace then 02  may not be required to keep sat > 90%

## 2019-09-28 NOTE — Assessment & Plan Note (Signed)
Quit smoking 06/2019 - PFTs  11/18/10  FEV1  2.0 (95%) ratio 73 and no change p saba and DLCO 75 corrects to 100% -PFT's  03/08/2015  FEV1 1.85 (78 % ) ratio 73  p 5 % improvement from saba with DLCO  63 % corrects to 72 % for alv volume and erv 17   - added flutter 06/07/2015  - PFT's  08/16/2016  FEV1 1.42 (61 % ) ratio 71  p 8 % improvement FEV1 (and 20% FVC) from saba p symb 160  prior to study with DLCO  61/58 % corrects to 78 % for alv volume    - Allergy profile 08/16/2016 >  Eos 0.5 /  IgE  99 RAST neg -  ADDED prednisone x 6 days cycles 11/20/2016  - 05/23/2017  After extensive coaching HFA effectiveness =    90% - PFT's  11/28/2017  FEV1 1.27 (57 % ) ratio 63  p 6 % improvement from saba p symb x 160 x 2  prior to study with DLCO  61 % corrects to 81  % for alv volume   - alpha one AT screen 06/10/2018    MM level 141   - 01/24/2019    try Breztri 2bid and pred x 6 days > much better 02/21/2019  but can't afford so changed to bevespi plus prn pred for flares  - 03/17/2019    Try  beztri   - 08/14/2019  After extensive coaching inhaler device,  effectiveness =    90% > continue brezri   Group D in terms of symptom/risk and laba/lama/ICS  therefore appropriate rx at this point >>>  breztri plus 6 day cycles of pred when doing worse and saba use goes up over baseline   I spent extra time with pt today reviewing appropriate use of albuterol for prn use on exertion with the following points: 1) saba is for relief of sob that does not improve by walking a slower pace or resting but rather if the pt does not improve after trying this first. 2) If the pt is convinced, as many are, that saba helps recover from activity faster then it's easy to tell if this is the case by re-challenging : ie stop, take the inhaler, then p 5 minutes try the exact same activity (intensity of workload) that just caused the symptoms and see if they are substantially diminished or not after saba 3) if there is an activity that  reproducibly causes the symptoms, try the saba 15 min before the activity on alternate days   If in fact the saba really does help, then fine to continue to use it prn but advised may need to look closer at the maintenance regimen being used to achieve better control of airways disease with exertion.

## 2019-09-29 ENCOUNTER — Telehealth: Payer: Self-pay | Admitting: Internal Medicine

## 2019-09-29 NOTE — Progress Notes (Signed)
LMTCB

## 2019-09-29 NOTE — Telephone Encounter (Signed)
Pt called back, please return call  

## 2019-09-29 NOTE — Telephone Encounter (Signed)
Advised pt of results. Pt understood. She asked about appt with Dr. Caralyn Guile. I called their office to check on referral. They did say that they got the referral and she is in cue to be called. I called pt to let her know. She understood and nothing further is needed.    Len Blalock, [place]   09/29/19 3:14 PM Note   Call pt: Reviewed cxr and no acute change so no change in recommendations made at ov ----------------- lmtcb X1 for pt to relay results/recs.

## 2019-09-29 NOTE — Telephone Encounter (Addendum)
Call pt: Reviewed cxr and no acute change so no change in recommendations made at ov ----------------- lmtcb X1 for pt to relay results/recs.

## 2019-09-30 ENCOUNTER — Telehealth: Payer: Self-pay | Admitting: *Deleted

## 2019-09-30 ENCOUNTER — Ambulatory Visit: Payer: Medicare HMO | Admitting: Family

## 2019-09-30 NOTE — Telephone Encounter (Signed)
Tried calling pt and there was no answer-LMTCB x 1

## 2019-09-30 NOTE — Telephone Encounter (Signed)
Spoke with the pt and notified of recs per Dr Melvyn Novas and she verbalized understanding.

## 2019-09-30 NOTE — Telephone Encounter (Signed)
-----   Message from Tanda Rockers, MD sent at 09/28/2019  6:27 AM EDT ----- Charge review indicates she is supposed to have pred x 6 days available if getting worse on albuterol. I refilled it for her she just needs to be remined

## 2019-10-14 ENCOUNTER — Telehealth: Payer: Self-pay | Admitting: Internal Medicine

## 2019-10-14 DIAGNOSIS — J449 Chronic obstructive pulmonary disease, unspecified: Secondary | ICD-10-CM

## 2019-10-14 NOTE — Telephone Encounter (Signed)
Patient notified that new order for POC placed.

## 2019-10-14 NOTE — Telephone Encounter (Signed)
Patient calling and requesting an order for a POC. Please advise.

## 2019-10-14 NOTE — Telephone Encounter (Signed)
Note ov : -  08/14/2019 desats on RA walking corrected with 3lpm POC > ordered   Should have already been done

## 2019-10-15 ENCOUNTER — Telehealth: Payer: Self-pay | Admitting: Internal Medicine

## 2019-10-15 NOTE — Telephone Encounter (Signed)
Attempted to call patient, unable to leave message.

## 2019-10-16 DIAGNOSIS — J449 Chronic obstructive pulmonary disease, unspecified: Secondary | ICD-10-CM | POA: Diagnosis not present

## 2019-10-16 NOTE — Telephone Encounter (Signed)
Spoke with the pt  She is requesting that her last cxr report be mailed to her  I verified her address and have done so  Nothing further needed

## 2019-10-28 ENCOUNTER — Telehealth: Payer: Self-pay | Admitting: Internal Medicine

## 2019-10-28 MED ORDER — BREZTRI AEROSPHERE 160-9-4.8 MCG/ACT IN AERO: 2.0000 | INHALATION_SPRAY | Freq: Two times a day (BID) | RESPIRATORY_TRACT | 0 refills | Status: DC

## 2019-10-28 NOTE — Telephone Encounter (Signed)
Samples of breztri have been placed up front for pt. Attempted to call pt to let her know this had been done but unable to reach. Left pt a detailed message letting her know that the samples were up front at the office for her that she could come by and pick up. Nothing further needed.

## 2019-11-04 ENCOUNTER — Encounter: Payer: Self-pay | Admitting: Gastroenterology

## 2019-11-05 ENCOUNTER — Other Ambulatory Visit: Payer: Self-pay | Admitting: Nurse Practitioner

## 2019-11-14 ENCOUNTER — Other Ambulatory Visit: Payer: Self-pay | Admitting: Nurse Practitioner

## 2019-11-16 DIAGNOSIS — J449 Chronic obstructive pulmonary disease, unspecified: Secondary | ICD-10-CM | POA: Diagnosis not present

## 2019-11-17 ENCOUNTER — Other Ambulatory Visit: Payer: Self-pay | Admitting: Internal Medicine

## 2019-11-17 DIAGNOSIS — M25512 Pain in left shoulder: Secondary | ICD-10-CM | POA: Diagnosis not present

## 2019-11-17 DIAGNOSIS — M79605 Pain in left leg: Secondary | ICD-10-CM | POA: Diagnosis not present

## 2019-11-18 DIAGNOSIS — L570 Actinic keratosis: Secondary | ICD-10-CM | POA: Diagnosis not present

## 2019-11-18 DIAGNOSIS — B353 Tinea pedis: Secondary | ICD-10-CM | POA: Diagnosis not present

## 2019-11-18 DIAGNOSIS — X32XXXD Exposure to sunlight, subsequent encounter: Secondary | ICD-10-CM | POA: Diagnosis not present

## 2019-11-20 ENCOUNTER — Other Ambulatory Visit: Payer: Self-pay

## 2019-11-20 ENCOUNTER — Ambulatory Visit: Payer: Medicare HMO | Admitting: Internal Medicine

## 2019-11-20 ENCOUNTER — Encounter: Payer: Self-pay | Admitting: Internal Medicine

## 2019-11-20 DIAGNOSIS — J9611 Chronic respiratory failure with hypoxia: Secondary | ICD-10-CM | POA: Diagnosis not present

## 2019-11-20 DIAGNOSIS — F17210 Nicotine dependence, cigarettes, uncomplicated: Secondary | ICD-10-CM | POA: Diagnosis not present

## 2019-11-20 DIAGNOSIS — J449 Chronic obstructive pulmonary disease, unspecified: Secondary | ICD-10-CM | POA: Diagnosis not present

## 2019-11-20 MED ORDER — BREZTRI AEROSPHERE 160-9-4.8 MCG/ACT IN AERO
2.0000 | INHALATION_SPRAY | Freq: Two times a day (BID) | RESPIRATORY_TRACT | 0 refills | Status: DC
Start: 2019-11-20 — End: 2019-11-20

## 2019-11-20 NOTE — Assessment & Plan Note (Signed)
Quit smoking 06/2019 - PFTs  11/18/10  FEV1  2.0 (95%) ratio 73 and no change p saba and DLCO 75 corrects to 100% -PFT's  03/08/2015  FEV1 1.85 (78 % ) ratio 73  p 5 % improvement from saba with DLCO  63 % corrects to 72 % for alv volume and erv 17   - added flutter 06/07/2015  - PFT's  08/16/2016  FEV1 1.42 (61 % ) ratio 71  p 8 % improvement FEV1 (and 20% FVC) from saba p symb 160  prior to study with DLCO  61/58 % corrects to 78 % for alv volume    - Allergy profile 08/16/2016 >  Eos 0.5 /  IgE  99 RAST neg -  ADDED prednisone x 6 days cycles 11/20/2016  - 05/23/2017  After extensive coaching HFA effectiveness =    90% - PFT's  11/28/2017  FEV1 1.27 (57 % ) ratio 63  p 6 % improvement from saba p symb x 160 x 2  prior to study with DLCO  61 % corrects to 81  % for alv volume   - alpha one AT screen 06/10/2018    MM level 141   - 01/24/2019    try Breztri 2bid and pred x 6 days > much better 02/21/2019  but can't afford so changed to bevespi plus prn pred for flares  - 03/17/2019    Try  beztri   - 08/14/2019  After extensive coaching inhaler device,  effectiveness =    90% > continue brezri   Group D in terms of symptom/risk and laba/lama/ICS  therefore appropriate rx at this point >>>  Improved back to baseline despite active smoking > no change rx needed   F/u q 3 m,, sooner prn

## 2019-11-20 NOTE — Progress Notes (Signed)
Subjective:   Patient ID: Gabrielle White, female    DOB: 1945/04/15 .   MRN: [id number]    Brief patient profile:  74  yowf  MM/ active smoker with AB/GOLD 0 copd by pfts 03/08/15 and GOLD II criteria 11/28/2017     History of Present Illness  01/22/2015 acute extended re-establish ov/Dheeraj Hail re:  AB/ still smoking  Chief Complaint  Patient presents with  . Pulmonary Consult    pt last seen in 2014. pt states DR. Green wanted her to follow up. pt states somethines her throat feels like it closes up and she cant breath. pt states she feels like she has to take really deep breaths. pt c/o chest tightness, and prod cough white in color.pt c/o thrush she thiks may come from the inhailers.     Last better breathing  x 4 weeks prior to OV  p using prednisone but benefit only for a few days p stopped despite maint rx with symbicort (though hfa poor)  Nl routine is symbicort 160 2 bid and maybe ventolin and occ brovana no recent duoneb need  In retrospect really hasn't been able to really stay well x 4 y Cough is harsh and congested worse day than noct  rec Start Prednisone 10mg  Take 4 for two days three for two days two for two days one for two days  Plan A =  Automatic = symbicort 160 Take 2 puffs first thing in am and then another 2 puffs about 12 hours later.  Work on Interior and spatial designer: Plan B = Backup  Only use your albuterol  Plan C = crisis  Only use nebulizer iprotropium-albuterol (duoneb) if you try the ventolin first and it fails to help > ok to use up to every 4 hours  Plan D = doctor call us if not improving  Plan E = ER > go there if all else fails Try prilosec otc 20mg   Take 30-60 min before first meal of the day and Pepcid ac (famotidine) 20 mg one @  bedtime    GERD (REFLUX) diet             01/24/2019  f/u ov/Allora Bains re: GOLD II/ still smoking maint rx symb 160 2bid  Chief Complaint  Patient presents with  . Follow-up    Breathing may be slightly better  since the last visit- relates to cooler weather- using proair 2-3 x per day.   Dyspnea:  MMRC2 = can't walk a nl pace on a flat grade s sob but does fine slow and flat but by hour 10 wearing off symb and needs albuterol typically prior to pm dose of symb Cough: spells sporacic, white daytime mostly  Sleeping: sleeping on enough pillows to get 30 degrees  SABA use: as above  rec Plan A = Automatic = Always=    Breztri Take 2 puffs first thing in am and then another 2 puffs about 12 hours later.  Work on inhaler technique:    Plan B = Backup (to supplement plan A, not to replace it) Only use your albuterol inhaler as a rescue medication   Prednisone 10 mg take  4 each am x 2 days,   2 each am x 2 days,  1 each am x 2 days and stop  Please schedule a follow up office visit in 6 months, call sooner if needed     02/21/2019  f/u ov/Shanaye Rief re: GOLD II/ still smoking/ problems with access to affordable  meds / does not have pred refillable as Plan C Chief Complaint  Patient presents with  . Follow-up    Patient is here for follow up for COPD. Insurance denied [employer].  Dyspnea:  Walking end of road and back now x 15-20 min s stopping / slow pace = MMRC2 = can't walk a nl pace on a flat grade s sob but does fine slow and flat  Cough: some sporadic esp in am / minimal mucoid sputum   Sleeping: no resp problems SABA use: avg twice daily at most, less while on breztri rec Plan A = Automatic = Always=    Breztri = Bevespi (one or the other)  Take 2 puffs first thing in am and then another 2 puffs about 12 hours later.  Plan B = Backup (to supplement plan A, not to replace it) Only use your albuterol inhaler  Add Plan C: crisis If not improving or needing more albuterol than usual : Prednisone 10 mg take  4 each am x 2 days,   2 each am x 2 days,  1 each am x 2 days and stop  The key is to stop smoking completely before smoking completely stops you!   03/17/2019  f/u ov/Rosilyn Coachman re:  GOLD II copd/ still  smoking/ anoro and stiolto approved by [facility] but [facility] her choice  will be available Apr 04 2019  And using symb 160  2bid Chief Complaint  Patient presents with  . Follow-up    Pt states she had a COPD exacerbation 5 nights ago after she believes she might have taken too much anoro. Since the exacerbation, pt stated she felt like a "zombie" but now pt said she is getting better. Pt has occ cough with white to yellow phlegm.  Dyspnea:  No longer doing any outdoor walking  Cough: none  Sleeping: no resp symptoms  SABA use: once or twice daily  02: none  rec Plan A = Automatic = Always=    Breztri Take 2 puffs first thing in am and then another 2 puffs about 12 hours later.  Work on inhaler technique:   Plan B = Backup (to supplement plan A, not to replace it) Only use your albuterol inhaler (proair)  as a rescue medication  Plan C = Crisis (instead of Plan B but only if Plan B stops working) - Prednisone 10 mg take  4 each am x 2 days,   2 each am x 2 days,  1 each am x 2 days and stop    06/09/2019  f/u ov/Anet Logsdon re:  GOLD II copd/ still smoking / breztri  Chief Complaint  Patient presents with  . Follow-up    Increased SOB x 3 days. She has been coughing more over the past couple of days- minimal clear sputum.   Dyspnea: very sedentary this winter / now acutely sob with min activity In setting of severe congested cough but only minimal white mucus prod Sleeping: baseline fine, only 2 hours night prior to ov due sob  SABA use: 3-4 x per week then acutely increased x  3 days prior to OV   02: none Did not try prednisone as "plan C" since getting covid shot 06/11/19 and didn't want to interfere with that rec Go directly to [place] [facility] for evaluation of new dx of respiratory failure/ copd exacerbation    Admit date: 06/09/2019 Discharge date: 06/15/2019   Principal Problem:   Acute respiratory failure with hypoxemia  Cigarette smoker   Hyperlipidemia    Hypothyroidism   Hyperglycemia   Essential hypertension   Depression   Polycythemia   COPD exacerbation (HCC)   History of Present Illness:   Patient is 74 year old female with history of COPD/emphysema, anxiety, depression, insomnia, hypertension, hyperlipidemia, tobacco abuse who presented to the ED from her pulmonologist office (Dr. Melvyn Novas) due to progressively worsening dyspnea for 4 to 5 days with associated wheezing without responding to albuterol at home. Patient also had increased sputum production. ED Course:Initial vital signs temperature 98.9 F, pulse 92, respirations 22, blood pressure 132/81 mmHg and O2 sat 91% on room air. The patient was given bronchodilator and 60 mg of prednisone. She became hypoxic and tachypneic with ambulation, so our service was asked to evaluate for further treatment. CBC shows white count 11.3, hemoglobin 15.6 g/dL and platelets 322. PT 12.3 and INR 0.9. Venous blood gas showed a decreased PO2 of 48.30 mmHg and bicarbonate of 29.7 mmol/L. CMP showed a glucose of 121 mg/dL, but was otherwise normal. Lactic acid was 1.1 mmol/L. SARS coronavirus 2, influenza A and B by PCR was negative. Her chest radiograph did not have any acute cardiopulmonary disease, but showed hyperinflation compatible with emphysema.  Hospital Course:   Following conditions were addressed during hospitalization as listed below,  Acute hypoxic respiratory failure secondary to COPD exacerbation.Likely secondary to viral illness and ongoing smoking.. Patient was seen by pulmonary during hospitalization.. Patient likely has gold stage II COPD. Pulmonary has an impression that patient has been advancing in her COPD due to continued smoking as evidenced by declining FEV1. Patient continues to smoke until just recently. Patient was  optimized on bronchodilators steroids but persisted to need 3 L of oxygen.  Patient will need oxygen on discharge.  Patient was emphasized the  need for quitting smoking, follow-up with your pulmonary physician and will consider ambulatory pulmonary rehab follow-up.  Continue p.o. prednisone taper on discharge.  Essential hypertensionon losartan. Continue. Closely monitor  Polycythemia secondary to COPD.  Hyperlipidemiaon atorvastatin  Hypothyroidismon levothyroxine  Depression on sertraline  GERD Continue Protonix    08/14/2019  Post hospf/u ov/Myracle Febres re: not smoking / Breztri maint  Chief Complaint  Patient presents with  . Follow-up    pt states in hospital for week.pt uses rescue haler2 a week. pt states uses 3l of 02  Dyspnea:  Sitting usually fine / also supine / steps are a problem but ok slow flat = MMRC2 = can't walk a nl pace on a flat grade s sob but does fine slow and flat   Cough: still some rattling but much less am mucus production  Sleeping: bed is flat, pillows to 30 degrees SABA use: sev times  A day  02:  No longer using due to electric bill went up on it  rec Plan A = Automatic = Always=    Breztri Take 2 puffs first thing in am and then another 2 puffs about 12 hours later.  Work on Orthoptist B = Backup (to supplement plan A, not to replace it) Only use your albuterol inhaler as a rescue medication Ok to try albuterol 15 min before an activity t For cough /congestion > mucinex dm  1200 mg every 12 hours and use flutter valve as much as you can Pantoprazole (protonix) 40 mg   Take  30-60 min before first meal of the day and Pepcid (famotidine)  20 mg one after supper until return to office - this is the  best way to tell whether stomach acid is contributing to your problem.   GERD  rx Make sure you check your oxygen saturations at highest level of activity   Please schedule a follow up office visit in 4 weeks, sooner if needed  with all medications /inhalers/ solutions in hand so we can verify exactly what you are taking. This includes all medications from all doctors and over  the counters   09/16/19  Cough/congestion/  2 different abx could not finish bactrim due to gi issues  then levaquin rx   09/26/2019  f/u ov/Keymarion Bearman re: GOLD II still smoking / new leg swelling  Chief Complaint  Patient presents with  . Follow-up    Breathing is unchanged since the last visit. She is using her albuterol inhaler 3 x per day.    Dyspnea:  MMRC2 = can't walk a nl pace on a flat grade s sob but does fine slow and flat  Cough: sometimes after supper but not during the day/ nothing purulent  Sleeping: bed is flat / 30 degrees with pillows  SABA use:  Twice daily avg / up to 3 x daily at her worst  02: wants to take back today but hasn't checked sats yet  rec No change in medications - call if your drugstore runs out of [employer] and I will call the company rep to fix it. For cough/ congestion:  mucinex or mucinex dm up to 1200 mg every 12 hours as needed The key is to stop smoking completely before smoking completely stops you! 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 Follow-up    SOB unchanged worse with hot/ cold weather    Dyspnea:  MMRC2 = can't walk a nl pace on a flat grade s sob but does fine slow and flat   Cough:  slt rattle nothing nasty Sleeping: no resp symptoms flat bed pillow to 30 degrees= baseline  SABA use: occ flare 02:  Still has it, rarely uses    No obvious day to day or daytime variability or assoc excess/ purulent sputum or mucus plugs or hemoptysis or cp or chest tightness, subjective wheeze or overt sinus or hb symptoms.   Sleeping without nocturnal  or early am exacerbation  of respiratory  c/o's or need for noct saba. Also denies any obvious fluctuation of symptoms with weather or environmental changes or other aggravating or alleviating factors except as outlined above   No unusual exposure hx or h/o  childhood pna/ asthma or knowledge of premature birth.  Current Allergies, Complete Past Medical History, Past Surgical History, Family History, and Social History were reviewed in [employer] record.  ROS  The following are not active complaints unless bolded Hoarseness, sore throat, dysphagia, dental problems, itching, sneezing,  nasal congestion or discharge of excess mucus or purulent secretions, ear ache,   fever, chills, sweats, unintended wt loss or wt gain, classically pleuritic or exertional cp,  orthopnea pnd or arm/hand swelling  or leg swelling, presyncope, palpitations, abdominal pain, anorexia, nausea, vomiting, diarrhea  or change in bowel habits or change in bladder habits, change in stools or change in urine, dysuria, hematuria,  rash, arthralgias, visual complaints, headache, numbness, weakness or ataxia or problems with walking or coordination,  change in mood or  memory.  Current Meds  Medication Sig  . acetaminophen (TYLENOL) 500 MG tablet Take 500 mg by mouth every 6 (six) hours as needed for mild pain or headache.  . albuterol (VENTOLIN HFA) 108 (90 Base) MCG/ACT inhaler Inhale 2 puffs into the lungs every 6 (six) hours as needed for wheezing or shortness of breath.  . ALPRAZolam (XANAX) 0.5 MG tablet TAKE 1 TABLET BY MOUTH TWICE DAILY AS NEEDED FOR NERVOUSNESS/SLEEP  . atorvastatin (LIPITOR) 40 MG tablet TAKE 1 TABLET BY MOUTH DAILY FOR HIGH CHOLESTEROL  .     Marland Kitchen Budeson-Glycopyrrol-Formoterol (BREZTRI AEROSPHERE) 160-9-4.8 MCG/ACT AERO Inhale 2 puffs into the lungs in the morning and at bedtime.  . Cholecalciferol (VITAMIN D3) 1000 UNITS CAPS Take 1 capsule by mouth daily.    . famotidine (PEPCID) 20 MG tablet One after supper  . levothyroxine (SYNTHROID) 125 MCG tablet TAKE 1 TABLET(125 MCG) BY MOUTH DAILY  . losartan (COZAAR) 50 MG tablet Take 50 mg by mouth daily.  . pantoprazole (PROTONIX) 40 MG tablet TAKE 1 TABLET(40 MG) BY MOUTH DAILY  BEFORE BREAKFAST  . sertraline (ZOLOFT) 100 MG tablet TAKE 1 TABLET(100 MG) BY MOUTH AT BEDTIME                   Objective:   Physical Exam   11/20/2019      163 08/14/2019     168 06/09/2019       157  03/17/2019   157 02/21/2019   154  01/24/2019   156  12/11/2018       157  12/20/12 157     Vital signs reviewed  11/20/2019  - Note at rest 02 sats  96% on RA        HEENT : pt wearing mask not removed for exam due to covid - 19 concerns.    NECK :  without JVD/Nodes/TM/ nl carotid upstrokes bilaterally   LUNGS: no acc muscle use,  Mild barrel  contour chest wall with bilateral insp/exp rhonchi and  without cough on insp or exp maneuvers  and mild  Hyperresonant  to  percussion bilaterally     CV:  RRR  no s3 or murmur or increase in P2, and no edema   ABD:  soft and nontender with pos end  insp Hoover's  in the supine position. No bruits or organomegaly appreciated, bowel sounds nl  MS:   Nl gait/  ext warm without deformities, calf tenderness, cyanosis or clubbing No obvious joint restrictions   SKIN: warm and dry without lesions    NEURO:  alert, approp, nl sensorium with  no motor or cerebellar deficits apparent.              Assessment & Plan:

## 2019-11-20 NOTE — Assessment & Plan Note (Addendum)
>    3 min discussion re active cigarette smoking in addition to office E&M  Ask about tobacco use:   ongoing Advise quitting   She is very fatalistic about smoking. I emphasized that although we never turn away smokers from the pulmonary clinic, we do ask that they understand that the recommendations that we make  won't work nearly as well in the presence of continued cigarette exposure. In fact, we may very well  reach a point where we can't promise to help the patient if he/she can't quit smoking. (We can and will promise to try to help, we just can't promise what we recommend will really work)  Assess willingness:  Not committed at this point Assist in quit attempt:  Per PCP when ready Arrange follow up:   Follow up per Primary Care planned

## 2019-11-20 NOTE — Patient Instructions (Signed)
No change in medications  Please schedule a follow up visit in 3 months but call sooner if needed  

## 2019-11-20 NOTE — Assessment & Plan Note (Addendum)
Placed on 02 at d/c from admit 06/15/19  -  08/14/2019 desats on RA walking corrected with 3lpm POC > ordered   Again advised: Make sure you check your oxygen saturations at highest level of activity to be sure it stays over 90% and adjust upward to maintain this level if needed but remember to turn it back to previous settings when you stop (to conserve your supply).

## 2019-11-25 ENCOUNTER — Other Ambulatory Visit: Payer: Medicare HMO

## 2019-11-27 ENCOUNTER — Ambulatory Visit: Payer: Medicare HMO | Admitting: Nurse Practitioner

## 2019-11-28 ENCOUNTER — Telehealth: Payer: Self-pay | Admitting: Internal Medicine

## 2019-11-28 NOTE — Telephone Encounter (Signed)
I have the pt assistance forms for the breztri and have placed in Dr Gustavus Bryant lookat to be signed

## 2019-12-01 NOTE — Telephone Encounter (Signed)
Done 12/01/2019

## 2019-12-01 NOTE — Telephone Encounter (Signed)
Checked with Dr. Melvyn Novas to see if he still had the patient assistance paperwork and he stated that he handed it to Junior today 8/30. Called and spoke with pt letting her know that the paperwork has been completed and that we would take care of sending the forms in for her. Pt verbalized understanding. Nothing further needed.

## 2019-12-09 ENCOUNTER — Other Ambulatory Visit: Payer: Self-pay

## 2019-12-09 ENCOUNTER — Encounter: Payer: Self-pay | Admitting: Family

## 2019-12-09 ENCOUNTER — Ambulatory Visit (INDEPENDENT_AMBULATORY_CARE_PROVIDER_SITE_OTHER): Payer: Medicare HMO | Admitting: Family

## 2019-12-09 VITALS — BP 100/66 | HR 87 | Temp 97.5°F | Resp 16 | Ht 65.0 in | Wt 165.6 lb

## 2019-12-09 DIAGNOSIS — N898 Other specified noninflammatory disorders of vagina: Secondary | ICD-10-CM

## 2019-12-09 DIAGNOSIS — R399 Unspecified symptoms and signs involving the genitourinary system: Secondary | ICD-10-CM | POA: Diagnosis not present

## 2019-12-09 LAB — POCT URINALYSIS DIPSTICK
Bilirubin, UA: NEGATIVE
Glucose, UA: NEGATIVE
Ketones, UA: POSITIVE
Nitrite, UA: NEGATIVE
Protein, UA: POSITIVE — AB
Spec Grav, UA: 1.025 (ref 1.010–1.025)
Urobilinogen, UA: NEGATIVE E.U./dL — AB
pH, UA: 5 (ref 5.0–8.0)

## 2019-12-09 NOTE — Progress Notes (Signed)
Provider: Arlind Klingerman FNP-C  Lauree Chandler, NP  Patient Care Team: Lauree Chandler, NP as PCP - General (Geriatric Medicine)  Extended Emergency Contact Information Primary Emergency Contact: [facility] The Phone: [phone] Relation: Son Secondary Emergency Contact: Lasandra Beech [place] Mobile Phone: [phone] Relation: Friend  Code Status:  DNR Goals of care: Advanced Directive information Advanced Directives 12/09/2019  Does Patient Have a Medical Advance Directive? Yes  Type of Advance Directive Out of facility DNR (pink MOST or yellow form)  Does patient want to make changes to medical advance directive? No - Patient declined  Copy of [place] in Chart? -  Would patient like information on creating a medical advance directive? -     Chief Complaint  Patient presents with  . Acute Visit    Complains of possible cystitis or vaginal infection.    HPI:  Pt is a 74 y.o. female seen today for an acute visit for evaluation of vaginal drainage and burning sensation for several days.Has noticed white vaginal discharge.Has chronic incontinency and urine frequency.wears incontinent pad but does not change frequently.she states thinks she has cystitis.she denies any abdominal pain,urgency or back pain.Also denies any fever or chills.   Past Medical History:  Diagnosis Date  . Chronic airway obstruction, not elsewhere classified   . Depressive disorder   . Depressive disorder, not elsewhere classified   . Emphysema   . Enthesopathy of ankle and tarsus, unspecified   . HTN (hypertension)   . Hyperlipidemia   . Hypertension   . Hypopotassemia   . Leukocytosis   . Migraine, unspecified, without mention of intractable migraine without mention of status migrainosus   . Nonspecific (abnormal) findings on radiological and other examination of skull and head   . Osteoarthrosis, unspecified whether generalized or localized,  unspecified site   . Other emphysema ([place])   . Other specified disease of white blood cells   . Palpitations   . RMSF [facility] spotted fever) 08/23/2015   Positive IgM. Treated with doxycycline.   . Tobacco abuse    Past Surgical History:  Procedure Laterality Date  . CESAREAN SECTION    . COLONOSCOPY  08/14/2008   Dr.. Delfin Edis    Allergies  Allergen Reactions  . Cafergot   . Codeine   . Fenoprofen Calcium   . Nalfon [Fenoprofen]     Outpatient Encounter Medications as of 12/09/2019  Medication Sig  . acetaminophen (TYLENOL) 500 MG tablet Take 500 mg by mouth every 6 (six) hours as needed for mild pain or headache.  . albuterol (VENTOLIN HFA) 108 (90 Base) MCG/ACT inhaler Inhale 2 puffs into the lungs every 6 (six) hours as needed for wheezing or shortness of breath.  . ALPRAZolam (XANAX) 0.5 MG tablet TAKE 1 TABLET BY MOUTH TWICE DAILY AS NEEDED FOR NERVOUSNESS/SLEEP  . atorvastatin (LIPITOR) 40 MG tablet TAKE 1 TABLET BY MOUTH DAILY FOR HIGH CHOLESTEROL  . Budeson-Glycopyrrol-Formoterol (BREZTRI AEROSPHERE) 160-9-4.8 MCG/ACT AERO Inhale 2 puffs into the lungs 2 (two) times daily.  . Cholecalciferol (VITAMIN D3) 1000 UNITS CAPS Take 1 capsule by mouth daily.    . famotidine (PEPCID) 20 MG tablet Take 20 mg by mouth as needed for heartburn or indigestion.  Marland Kitchen levothyroxine (SYNTHROID) 125 MCG tablet TAKE 1 TABLET(125 MCG) BY MOUTH DAILY  . losartan (COZAAR) 50 MG tablet Take 50 mg by mouth daily.  . pantoprazole (PROTONIX) 40 MG tablet TAKE 1 TABLET(40 MG) BY MOUTH DAILY BEFORE BREAKFAST  .  PREDNISONE PO Take by mouth as needed.  . sertraline (ZOLOFT) 100 MG tablet TAKE 1 TABLET(100 MG) BY MOUTH AT BEDTIME  . [DISCONTINUED] famotidine (PEPCID) 20 MG tablet One after supper   No facility-administered encounter medications on file as of 12/09/2019.    Review of Systems  Constitutional: Negative for appetite change, chills, fatigue and fever.  Respiratory: Negative for  cough, chest tightness, shortness of breath and wheezing.   Cardiovascular: Negative for chest pain, palpitations and leg swelling.  Gastrointestinal: Negative for abdominal distention, abdominal pain, blood in stool, constipation, diarrhea, nausea and vomiting.  Genitourinary: Positive for dysuria, frequency and vaginal discharge. Negative for difficulty urinating, flank pain, urgency, vaginal bleeding and vaginal pain.  Musculoskeletal: Negative for back pain and gait problem.  Skin: Negative for color change, pallor and rash.  Psychiatric/Behavioral: Negative for agitation, confusion and sleep disturbance. The patient is not nervous/anxious.     Immunization History  Administered Date(s) Administered  . Fluad Quad(high Dose 65+) 11/22/2018  . Influenza Split 01/01/2013  . Influenza,inj,Quad PF,6+ Mos 01/08/2013, 12/17/2013, 03/10/2015, 03/06/2016, 05/06/2018  . Influenza,inj,quad, With Preservative 02/15/2017  . Influenza-Unspecified 02/20/2017  . PFIZER SARS-COV-2 Vaccination 05/17/2019, 07/03/2019  . Pneumococcal Conjugate-13 10/06/2015  . Pneumococcal Polysaccharide-23 04/11/2011  . Td 04/04/2004, 11/01/2018  . Zoster 01/12/2014   Pertinent  Health Maintenance Due  Topic Date Due  . COLONOSCOPY  04/07/2019  . INFLUENZA VACCINE  11/02/2019  . DEXA SCAN  Completed  . PNA vac Low Risk Adult  Completed   Fall Risk  12/09/2019 09/08/2019 09/08/2019 05/26/2019 02/24/2019  Falls in the past year? 0 1 1 0 0  Number falls in past yr: 0 0 0 0 0  Injury with Fall? 0 0 0 0 0  Comment - - - - -  Risk for fall due to : - - - - -    Vitals:   12/09/19 1421  BP: 100/66  Pulse: 87  Resp: 16  Temp: (!) 97.5 F (36.4 C)  SpO2: 95%  Weight: 165 lb 9.6 oz (75.1 kg)  Height: 5\' 5"  (1.651 m)   Body mass index is 27.56 kg/m. Physical Exam Vitals reviewed. Exam conducted with a chaperone present [facility] Dillard CMA).  Constitutional:      General: She is not in acute distress.     Appearance: She is not ill-appearing.  HENT:     Head: Normocephalic.  Cardiovascular:     Rate and Rhythm: Normal rate and regular rhythm.     Pulses: Normal pulses.     Heart sounds: Normal heart sounds. No murmur heard.  No friction rub. No gallop.   Pulmonary:     Effort: Pulmonary effort is normal. No respiratory distress.     Breath sounds: Rhonchi present. No wheezing or rales.     Comments: Chronic Rhonchi  Chest:     Chest wall: No tenderness.  Abdominal:     General: Bowel sounds are normal. There is no distension.     Palpations: Abdomen is soft. There is no mass.     Tenderness: There is no abdominal tenderness. There is no right CVA tenderness, left CVA tenderness, guarding or rebound.     Hernia: There is no hernia in the left inguinal area or right inguinal area.  Genitourinary:    General: Normal vulva.     Exam position: Lithotomy position.     Pubic Area: No rash or pubic lice.      Labia:  Right: No rash, tenderness, lesion or injury.        Left: No rash, tenderness, lesion or injury.      Urethra: No prolapse, urethral pain, urethral swelling or urethral lesion.     Vagina: Normal.     Cervix: Normal.     Comments: Scant amount of whitish drainage.no odor noted.  Lymphadenopathy:     Lower Body: No right inguinal adenopathy. No left inguinal adenopathy.  Skin:    General: Skin is warm.     Coloration: Skin is not pale.     Findings: No bruising, erythema or rash.  Neurological:     Mental Status: She is alert and oriented to person, place, and time.     Cranial Nerves: No cranial nerve deficit.     Motor: No weakness.     Gait: Gait normal.  Psychiatric:        Mood and Affect: Mood normal.        Behavior: Behavior normal.        Thought Content: Thought content normal.        Judgment: Judgment normal.     Labs reviewed: Recent Labs    06/09/19 1105 06/10/19 0805 06/12/19 0444 06/12/19 0444 06/14/19 0536 06/14/19 0536  06/20/19 1957 08/06/19 1519 09/08/19 1534  NA 140   < > 142   < > 139   < > 141 140 143  K 4.0   < > 4.1   < > 4.1   < > 4.1 4.2 3.6  CL 103   < > 104   < > 101   < > 104 106 104  CO2 30   < > 28   < > 30   < > 28 25 33*  GLUCOSE 121*   < > 100*   < > 142*   < > 112* 89 92  BUN 15   < > 19   < > 23   < > 20 19 19   CREATININE 0.76   < > 0.64   < > 0.61   < > 0.97 0.75 0.95*  CALCIUM 9.4   < > 8.8*   < > 8.7*   < > 8.6* 9.6 9.3  MG 2.1  --  2.2  --  2.3  --   --   --   --   PHOS 4.0  --   --   --   --   --   --   --   --    < > = values in this interval not displayed.   Recent Labs    06/09/19 1105 09/08/19 1534  AST 17 11  ALT 21 12  ALKPHOS 118  --   BILITOT 0.8 0.4  PROT 7.5 6.4  ALBUMIN 4.2  --    Recent Labs    06/09/19 1105 06/10/19 0805 06/20/19 1957 08/06/19 1519 09/08/19 1534  WBC 11.3*   < > 19.9* 13.1* 15.9*  NEUTROABS 7.6  --   --  8,567* 9,588*  HGB 15.6*   < > 14.4 13.7 14.0  HCT 49.8*   < > 44.3 41.7 42.4  MCV 97.5   < > 95.9 93.9 93.2  PLT 322   < > 300 289 339   < > = values in this interval not displayed.   Lab Results  Component Value Date   TSH 0.69 09/08/2019   Lab Results  Component Value Date   HGBA1C 5.3 08/06/2019   Lab  Results  Component Value Date   CHOL 171 11/22/2018   HDL 55 11/22/2018   LDLCALC 93 11/22/2018   TRIG 136 11/22/2018   CHOLHDL 3.1 11/22/2018    Significant Diagnostic Results in last 30 days:  No results found.  Assessment/Plan 1. Symptoms of urinary tract infection Afebrile.  - POC Urinalysis Dipstick shows yellow cloudy urine with large blood,positive for protein ,has large leukocytes 3+ and negative for Nitrite  - encouraged to increase water intake - advised to change incontinent pad frequently when wet and perform peri-care from front to back instead of back to front to prevent infection  - Urine culture and sensitivity. - Notified will call in 3 days with final urine culture.Advised to call if running  any fever or having any chills.  -Additional education information on UTI provided on AVS   2. Vaginal discharge Scant amount of whitish non-thick discharge without any odor. Normal. Heriberto Antigua CMA present during pelvic exam.   Family/ staff Communication: Reviewed plan of care with patient verbalized understanding.   Labs/tests ordered: None   Next Appointment: As needed if symptoms worsen or fail to improve.   Sandrea Hughs, NP

## 2019-12-09 NOTE — Patient Instructions (Signed)
Urine send for culture will call you with result.Increase water intake. - change incontinent pad frequent and cleanse peri-area from front to back  - Notify provider if running any fever or chills   Acute Urinary Retention, Female  Acute urinary retention means that you cannot pee (urinate) at all, or that you pee too little and your bladder is not emptied completely. If it is not treated, it can lead to kidney damage or other serious problems. Follow these instructions at home:  Take over-the-counter and prescription medicines only as told by your doctor. Ask your doctor what medicines you should stay away from. Do not take any medicine unless your doctor says it is okay to do so.  If you were sent home with a tube that drains pee from the bladder (catheter), take care of it as told by your doctor.  Drink enough fluid to keep your pee clear or pale yellow.  If you were given an antibiotic, take it as told by your doctor. Do not stop taking the antibiotic even if you start to feel better.  Do not use any products that contain nicotine or tobacco, such as cigarettes and e-cigarettes. If you need help quitting, ask your doctor.  Watch for changes in your symptoms. Tell your doctor about them.  If told, keep track of any changes in your blood pressure at home. Tell your doctor about them.  Keep all follow-up visits as told by your doctor. This is important. Contact a doctor if:  You have spasms or you leak pee when you have spasms. Get help right away if:  You have chills or a fever.  You have blood in your pee.  You have a tube that drains the bladder and: ? The tube stops draining pee. ? The tube falls out. Summary  Acute urinary retention means that you cannot pee at all, or that you pee too little and your bladder is not emptied completely. If it is not treated, it can result in kidney damage or other serious problems.  If you were sent home with a tube that drains pee from  the bladder, take care of it as told by your doctor.  Pay attention to any changes in your symptoms. Tell your doctor about them. This information is not intended to replace advice given to you by your health care provider. Make sure you discuss any questions you have with your health care provider. Document Revised: 03/02/2017 Document Reviewed: 04/21/2016 Elsevier Patient Education  [place].

## 2019-12-11 ENCOUNTER — Other Ambulatory Visit: Payer: Self-pay

## 2019-12-11 DIAGNOSIS — R399 Unspecified symptoms and signs involving the genitourinary system: Secondary | ICD-10-CM

## 2019-12-11 DIAGNOSIS — K521 Toxic gastroenteritis and colitis: Secondary | ICD-10-CM

## 2019-12-11 DIAGNOSIS — Z9981 Dependence on supplemental oxygen: Secondary | ICD-10-CM | POA: Insufficient documentation

## 2019-12-11 LAB — URINE CULTURE
MICRO NUMBER:: 10920442
SPECIMEN QUALITY:: ADEQUATE

## 2019-12-11 MED ORDER — SACCHAROMYCES BOULARDII 250 MG PO CAPS: 250.0000 mg | ORAL_CAPSULE | Freq: Two times a day (BID) | ORAL | 0 refills | Status: DC

## 2019-12-11 MED ORDER — NITROFURANTOIN MONOHYD MACRO 100 MG PO CAPS: 100.0000 mg | ORAL_CAPSULE | Freq: Two times a day (BID) | ORAL | 0 refills | Status: DC

## 2019-12-17 DIAGNOSIS — J449 Chronic obstructive pulmonary disease, unspecified: Secondary | ICD-10-CM | POA: Diagnosis not present

## 2019-12-23 ENCOUNTER — Ambulatory Visit: Payer: Medicare HMO | Admitting: Gastroenterology

## 2019-12-24 ENCOUNTER — Other Ambulatory Visit: Payer: Self-pay | Admitting: Nurse Practitioner

## 2020-01-06 ENCOUNTER — Encounter: Payer: Medicare HMO | Admitting: Gastroenterology

## 2020-01-08 ENCOUNTER — Telehealth: Payer: Self-pay | Admitting: Nurse Practitioner

## 2020-01-08 DIAGNOSIS — I10 Essential (primary) hypertension: Secondary | ICD-10-CM

## 2020-01-08 DIAGNOSIS — R739 Hyperglycemia, unspecified: Secondary | ICD-10-CM

## 2020-01-08 DIAGNOSIS — E7849 Other hyperlipidemia: Secondary | ICD-10-CM

## 2020-01-08 NOTE — Telephone Encounter (Signed)
Pt called to make appt for lab & follow up bc she hasn't been in.  I have scheduled her for fasting labs 01/09/20 & follow up with Janett Billow 01/12/20 at 215p.  Would you put in necessary labs that are needed?  Thanks, Lattie Haw

## 2020-01-08 NOTE — Telephone Encounter (Signed)
She is due for lipids so if she would like to come in for lipid, cmp, cbc prior to appt that is okay

## 2020-01-08 NOTE — Telephone Encounter (Signed)
Per last routine visit on 09/08/2019 instructions indicate patient was to schedule a 3 month follow-up, fasting labs prior was not advised.  I will wait for Lauree Chandler, NP to weigh in on this message

## 2020-01-09 ENCOUNTER — Other Ambulatory Visit: Payer: Medicare HMO

## 2020-01-12 ENCOUNTER — Other Ambulatory Visit: Payer: Medicare HMO

## 2020-01-12 ENCOUNTER — Other Ambulatory Visit: Payer: Self-pay

## 2020-01-12 ENCOUNTER — Ambulatory Visit: Payer: Medicare HMO | Admitting: Nurse Practitioner

## 2020-01-12 DIAGNOSIS — I10 Essential (primary) hypertension: Secondary | ICD-10-CM | POA: Diagnosis not present

## 2020-01-12 DIAGNOSIS — E7849 Other hyperlipidemia: Secondary | ICD-10-CM | POA: Diagnosis not present

## 2020-01-12 DIAGNOSIS — R739 Hyperglycemia, unspecified: Secondary | ICD-10-CM | POA: Diagnosis not present

## 2020-01-12 LAB — COMPLETE METABOLIC PANEL WITH GFR
AG Ratio: 1.9 (calc) (ref 1.0–2.5)
ALT: 13 U/L (ref 6–29)
AST: 14 U/L (ref 10–35)
Albumin: 4.1 g/dL (ref 3.6–5.1)
Alkaline phosphatase (APISO): 97 U/L (ref 37–153)
BUN: 10 mg/dL (ref 7–25)
CO2: 31 mmol/L (ref 20–32)
Calcium: 9.3 mg/dL (ref 8.6–10.4)
Chloride: 105 mmol/L (ref 98–110)
Creat: 0.84 mg/dL (ref 0.60–0.93)
GFR, Est African American: 79 mL/min/{1.73_m2} (ref >=60)
GFR, Est Non African American: 68 mL/min/{1.73_m2} (ref >=60)
Globulin: 2.2 g/dL (calc) (ref 1.9–3.7)
Glucose, Bld: 106 mg/dL — ABNORMAL HIGH (ref 65–99)
Potassium: 4.1 mmol/L (ref 3.5–5.3)
Sodium: 142 mmol/L (ref 135–146)
Total Bilirubin: 0.5 mg/dL (ref 0.2–1.2)
Total Protein: 6.3 g/dL (ref 6.1–8.1)

## 2020-01-12 LAB — LIPID PANEL
Cholesterol: 165 mg/dL (ref <200)
HDL: 62 mg/dL (ref >=50)
LDL Cholesterol (Calc): 83 mg/dL (calc)
Non-HDL Cholesterol (Calc): 103 mg/dL (calc) (ref <130)
Total CHOL/HDL Ratio: 2.7 (calc) (ref <5.0)
Triglycerides: 103 mg/dL (ref <150)

## 2020-01-12 LAB — CBC WITH DIFFERENTIAL/PLATELET
Absolute Monocytes: 555 cells/uL (ref 200–950)
Basophils Absolute: 66 cells/uL (ref 0–200)
Basophils Relative: 0.7 %
Eosinophils Absolute: 348 cells/uL (ref 15–500)
Eosinophils Relative: 3.7 %
HCT: 44 % (ref 35.0–45.0)
Hemoglobin: 14.6 g/dL (ref 11.7–15.5)
Lymphs Abs: 2641 cells/uL (ref 850–3900)
MCH: 30.7 pg (ref 27.0–33.0)
MCHC: 33.2 g/dL (ref 32.0–36.0)
MCV: 92.6 fL (ref 80.0–100.0)
MPV: 11 fL (ref 7.5–12.5)
Monocytes Relative: 5.9 %
Neutro Abs: 5790 cells/uL (ref 1500–7800)
Neutrophils Relative %: 61.6 %
Platelets: 285 10*3/uL (ref 140–400)
RBC: 4.75 10*6/uL (ref 3.80–5.10)
RDW: 12.8 % (ref 11.0–15.0)
Total Lymphocyte: 28.1 %
WBC: 9.4 10*3/uL (ref 3.8–10.8)

## 2020-01-14 ENCOUNTER — Encounter: Payer: Self-pay | Admitting: Nurse Practitioner

## 2020-01-14 ENCOUNTER — Ambulatory Visit (INDEPENDENT_AMBULATORY_CARE_PROVIDER_SITE_OTHER): Payer: Medicare HMO | Admitting: Nurse Practitioner

## 2020-01-14 ENCOUNTER — Other Ambulatory Visit: Payer: Self-pay

## 2020-01-14 VITALS — BP 128/76 | HR 73 | Temp 96.9°F | Ht 65.0 in | Wt 165.0 lb

## 2020-01-14 DIAGNOSIS — F411 Generalized anxiety disorder: Secondary | ICD-10-CM | POA: Diagnosis not present

## 2020-01-14 DIAGNOSIS — I10 Essential (primary) hypertension: Secondary | ICD-10-CM | POA: Diagnosis not present

## 2020-01-14 DIAGNOSIS — E039 Hypothyroidism, unspecified: Secondary | ICD-10-CM | POA: Diagnosis not present

## 2020-01-14 DIAGNOSIS — E7849 Other hyperlipidemia: Secondary | ICD-10-CM

## 2020-01-14 DIAGNOSIS — Z23 Encounter for immunization: Secondary | ICD-10-CM | POA: Diagnosis not present

## 2020-01-14 DIAGNOSIS — J449 Chronic obstructive pulmonary disease, unspecified: Secondary | ICD-10-CM

## 2020-01-14 DIAGNOSIS — H9112 Presbycusis, left ear: Secondary | ICD-10-CM

## 2020-01-14 DIAGNOSIS — M8949 Other hypertrophic osteoarthropathy, multiple sites: Secondary | ICD-10-CM

## 2020-01-14 DIAGNOSIS — M159 Polyosteoarthritis, unspecified: Secondary | ICD-10-CM

## 2020-01-14 MED ORDER — DICLOFENAC SODIUM 1 % EX GEL: 2.0000 g | Freq: Four times a day (QID) | CUTANEOUS | 1 refills | Status: DC | PRN

## 2020-01-14 MED ORDER — DICLOFENAC SODIUM 1 % EX GEL: 2.0000 g | Freq: Four times a day (QID) | CUTANEOUS | 1 refills | Status: DC

## 2020-01-14 NOTE — Progress Notes (Signed)
Careteam: Patient Care Team: Lauree Chandler, NP as PCP - General (Geriatric Medicine)  PLACE OF SERVICE:  [place] Directive information Does Patient Have a Medical Advance Directive?: Yes, Type of Advance Directive: Out of facility DNR (pink MOST or yellow form), Pre-existing out of facility DNR order (yellow form or pink MOST form): Yellow form placed in chart (order not valid for inpatient use);Pink MOST form placed in chart (order not valid for inpatient use), Does patient want to make changes to medical advance directive?: No - Patient declined  Allergies  Allergen Reactions  . Cafergot   . Codeine   . Fenoprofen Calcium   . Nalfon [Fenoprofen]     Chief Complaint  Patient presents with  . Medical Management of Chronic Issues    Routine follow-up and discuss labs (copy printed). Refill Xanax. Moderate fall risk. Dicsuss need for colonoscopy and Hep C screening. Flu vaccine today. Examine ears for wax build up. Patient c/o arthritis. Patient c/o in feet and hands, patient questions if she has neuropathy. Patient c/o ankle swelling. Patient also c/o headache on right side of head off/on. Patient would like thyroid checked due to fatigue.      HPI: Patient is a 74 y.o. female for routine.   tsh was checked in June- in normal range.   Continues to smoke 10 cigarettes a day, has not cut back recently- she enjoys smoking. Reports "if I would die tomorrow, I don't care"  Reports she does not have any money to do anything Does not ever go anywhere except to the doctor.  Going to the dermatologist routinely. Has a follow up due to a fungus in between toes.   Reports she is living in her son townhouse and her son is constantly coming over and telling her what is wrong. Reports he is bi-polar and hateful to her. He is not on medication and getting worse.   COPD-followed by pulmonary- not currently using O2, on breztri and uses albuterol depending on weather- could  be once a day could be TID).   Anxiety- reports she takes at night due to increase anxiety, can not turn her mind off. Also taking zoloft 100 mg daily   GERD- controlled, does not use pepcid often.   Urinary incontinence wears pad  Osteoarthritis- in hands-sometimes have a hard time with fingers bending.  One night had worsening of pain.   Headache- ongoing, does not happen often. Last for 15 mins and then resolves on its own. 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   Review of Systems:  Review of Systems  Constitutional: Negative for chills, fever and weight loss.  HENT: Negative for tinnitus.   Respiratory: Positive for cough, sputum production and shortness of breath.        Chronic and stable. Continues to smoke.   Cardiovascular: Negative for chest pain, palpitations and leg swelling.  Gastrointestinal: Negative for abdominal pain, constipation, diarrhea and heartburn.  Genitourinary: Positive for frequency (with incontience). Negative for dysuria and urgency.  Musculoskeletal: Positive for joint pain. Negative for back pain, falls and myalgias.  Skin: Negative.   Neurological: Positive for headaches. Negative for dizziness.  Psychiatric/Behavioral: Negative for depression and memory loss. The patient does not have insomnia.     Past Medical History:  Diagnosis Date  . Chronic airway obstruction, not elsewhere classified   . Depressive disorder   . Depressive disorder, not elsewhere classified   . Emphysema   . Enthesopathy of  ankle and tarsus, unspecified   . HTN (hypertension)   . Hyperlipidemia   . Hypertension   . Hypopotassemia   . Leukocytosis   . Migraine, unspecified, without mention of intractable migraine without mention of status migrainosus   . Nonspecific (abnormal) findings on radiological and other examination of skull and head   . Osteoarthrosis, unspecified whether generalized or localized, unspecified site   . Other emphysema  ([place])   . Other specified disease of white blood cells   . Palpitations   . RMSF [facility] spotted fever) 08/23/2015   Positive IgM. Treated with doxycycline.   . Tobacco abuse    Past Surgical History:  Procedure Laterality Date  . CESAREAN SECTION    . COLONOSCOPY  08/14/2008   Dr.. Delfin Edis   Social History:   reports that she has been smoking cigarettes. She has a 25.00 pack-year smoking history. She has never used smokeless tobacco. She reports that she does not drink alcohol and does not use drugs.  Family History  Problem Relation Age of Onset  . Alzheimer's disease Father   . Heart disease Father   . Diabetes Father   . Skin cancer Father   . Heart disease Mother   . Breast cancer Mother     Medications: Patient's Medications  New Prescriptions   No medications on file  Previous Medications   ACETAMINOPHEN (TYLENOL) 500 MG TABLET    Take 500 mg by mouth every 6 (six) hours as needed for mild pain or headache.   ALBUTEROL (VENTOLIN HFA) 108 (90 BASE) MCG/ACT INHALER    Inhale 2 puffs into the lungs every 6 (six) hours as needed for wheezing or shortness of breath.   ALPRAZOLAM (XANAX) 0.5 MG TABLET    TAKE 1 TABLET BY MOUTH TWICE DAILY AS NEEDED FOR NERVOUSNESS/SLEEP   ATORVASTATIN (LIPITOR) 40 MG TABLET    TAKE 1 TABLET BY MOUTH DAILY FOR HIGH CHOLESTEROL   BUDESON-GLYCOPYRROL-FORMOTEROL (BREZTRI AEROSPHERE) 160-9-4.8 MCG/ACT AERO    Inhale 2 puffs into the lungs 2 (two) times daily.   CHOLECALCIFEROL (VITAMIN D3) 1000 UNITS CAPS    Take 1 capsule by mouth daily.     FAMOTIDINE (PEPCID) 20 MG TABLET    Take 20 mg by mouth as needed for heartburn or indigestion.   LEVOTHYROXINE (SYNTHROID) 125 MCG TABLET    TAKE 1 TABLET(125 MCG) BY MOUTH DAILY   LOSARTAN (COZAAR) 50 MG TABLET    TAKE 1 TABLET(50 MG) BY MOUTH DAILY   PREDNISONE PO    Take by mouth as needed.   SERTRALINE (ZOLOFT) 100 MG TABLET    TAKE 1 TABLET(100 MG) BY MOUTH AT BEDTIME  Modified Medications    No medications on file  Discontinued Medications   NITROFURANTOIN, MACROCRYSTAL-MONOHYDRATE, (MACROBID) 100 MG CAPSULE    Take 1 capsule (100 mg total) by mouth 2 (two) times daily.   PANTOPRAZOLE (PROTONIX) 40 MG TABLET    TAKE 1 TABLET(40 MG) BY MOUTH DAILY BEFORE BREAKFAST   SACCHAROMYCES BOULARDII (FLORASTOR) 250 MG CAPSULE    Take 1 capsule (250 mg total) by mouth 2 (two) times daily.    Physical Exam:  Vitals:   01/14/20 1521  BP: 128/76  Pulse: 73  Temp: (!) 96.9 F (36.1 C)  TempSrc: Temporal  SpO2: 97%  Weight: 165 lb (74.8 kg)  Height: 5\' 5"  (1.651 m)   Body mass index is 27.46 kg/m. Wt Readings from Last 3 Encounters:  01/14/20 165 lb (74.8 kg)  12/09/19 165  lb 9.6 oz (75.1 kg)  11/20/19 163 lb (73.9 kg)    Physical Exam Constitutional:      General: She is not in acute distress.    Appearance: She is well-developed. She is not diaphoretic.  HENT:     Head: Normocephalic and atraumatic.     Right Ear: External ear normal. There is no impacted cerumen.     Left Ear: External ear normal. There is no impacted cerumen.     Mouth/Throat:     Pharynx: No oropharyngeal exudate.  Eyes:     Conjunctiva/sclera: Conjunctivae normal.     Pupils: Pupils are equal, round, and reactive to light.  Cardiovascular:     Rate and Rhythm: Normal rate and regular rhythm.     Heart sounds: Normal heart sounds.  Pulmonary:     Effort: Pulmonary effort is normal. No respiratory distress.     Breath sounds: Wheezing and rhonchi present.  Abdominal:     General: Bowel sounds are normal.     Palpations: Abdomen is soft.  Musculoskeletal:        General: No tenderness.     Cervical back: Normal range of motion and neck supple.  Skin:    General: Skin is warm and dry.     Comments: Arms and legs with scattered bruising and excoriation   Neurological:     Mental Status: She is alert and oriented to person, place, and time. Mental status is at baseline.  Psychiatric:         Mood and Affect: Mood normal.        Behavior: Behavior normal.     Labs reviewed: Basic Metabolic Panel: Recent Labs    06/09/19 1105 06/10/19 0805 06/12/19 0444 06/12/19 0444 06/14/19 0536 06/20/19 1957 08/06/19 1519 09/08/19 1534 01/12/20 1053  NA 140   < > 142   < > 139   < > 140 143 142  K 4.0   < > 4.1   < > 4.1   < > 4.2 3.6 4.1  CL 103   < > 104   < > 101   < > 106 104 105  CO2 30   < > 28   < > 30   < > 25 33* 31  GLUCOSE 121*   < > 100*   < > 142*   < > 89 92 106*  BUN 15   < > 19   < > 23   < > 19 19 10   CREATININE 0.76   < > 0.64   < > 0.61   < > 0.75 0.95* 0.84  CALCIUM 9.4   < > 8.8*   < > 8.7*   < > 9.6 9.3 9.3  MG 2.1  --  2.2  --  2.3  --   --   --   --   PHOS 4.0  --   --   --   --   --   --   --   --   TSH  --   --   --   --   --   --   --  0.69  --    < > = values in this interval not displayed.   Liver Function Tests: Recent Labs    06/09/19 1105 09/08/19 1534 01/12/20 1053  AST 17 11 14   ALT 21 12 13   ALKPHOS 118  --   --   BILITOT 0.8 0.4 0.5  PROT 7.5  6.4 6.3  ALBUMIN 4.2  --   --    No results for input(s): LIPASE, AMYLASE in the last 8760 hours. No results for input(s): AMMONIA in the last 8760 hours. CBC: Recent Labs    08/06/19 1519 09/08/19 1534 01/12/20 1053  WBC 13.1* 15.9* 9.4  NEUTROABS 8,567* 9,588* 5,790  HGB 13.7 14.0 14.6  HCT 41.7 42.4 44.0  MCV 93.9 93.2 92.6  PLT 289 339 285   Lipid Panel: Recent Labs    01/12/20 1053  CHOL 165  HDL 62  LDLCALC 83  TRIG 103  CHOLHDL 2.7   TSH: Recent Labs    09/08/19 1534  TSH 0.69   A1C: Lab Results  Component Value Date   HGBA1C 5.3 08/06/2019     Assessment/Plan 1. Need for influenza vaccination - Flu Vaccine QUAD High Dose(Fluad)  2. Primary osteoarthritis involving multiple joints - diclofenac Sodium (VOLTAREN) 1 % GEL; Apply 2 g topically 4 (four) times daily as needed .  Dispense: 50 g; Refill: 1  3. Presbycusis of left ear, unspecified hearing  status on contralateral side - Ambulatory referral to Audiology  4. Other hyperlipidemia -LDL at goal, continues on lipitor daily with dietary modifications.   5. Essential hypertension Controlled on losartan.   6. Anxiety state -ongoing but stable on xanax and zoloft.   7. Chronic obstructive airway disease with asthma ([place]) Continues to smoke, does not wish to quit. Continues on breztri and albuterol PRN.  8. Hypothyroidism, unspecified type -TSH in normal range on last labs in June, continues on synthroid 125 mcg   Next appt: 4 months.  Carlos American. [place], [facility] [phone]

## 2020-01-15 ENCOUNTER — Other Ambulatory Visit: Payer: Self-pay | Admitting: *Deleted

## 2020-01-15 MED ORDER — ALPRAZOLAM 0.5 MG PO TABS: ORAL_TABLET | ORAL | 0 refills | Status: DC

## 2020-01-15 NOTE — Telephone Encounter (Signed)
Patient called requesting refill Stated that she was in yesterday and requested refill but the pharmacy does not have it.  Epic LR: 11/06/2019 Pended Rx and sent to [facility] for approval.

## 2020-01-16 DIAGNOSIS — J449 Chronic obstructive pulmonary disease, unspecified: Secondary | ICD-10-CM | POA: Diagnosis not present

## 2020-01-20 ENCOUNTER — Encounter: Payer: Self-pay | Admitting: Internal Medicine

## 2020-01-30 DIAGNOSIS — L84 Corns and callosities: Secondary | ICD-10-CM | POA: Diagnosis not present

## 2020-02-12 ENCOUNTER — Other Ambulatory Visit: Payer: Self-pay | Admitting: Nurse Practitioner

## 2020-02-14 ENCOUNTER — Other Ambulatory Visit: Payer: Self-pay | Admitting: Internal Medicine

## 2020-02-16 DIAGNOSIS — J449 Chronic obstructive pulmonary disease, unspecified: Secondary | ICD-10-CM | POA: Diagnosis not present

## 2020-02-18 ENCOUNTER — Encounter: Payer: Self-pay | Admitting: Nurse Practitioner

## 2020-02-18 ENCOUNTER — Ambulatory Visit (INDEPENDENT_AMBULATORY_CARE_PROVIDER_SITE_OTHER): Payer: Medicare HMO | Admitting: Nurse Practitioner

## 2020-02-18 ENCOUNTER — Other Ambulatory Visit: Payer: Self-pay

## 2020-02-18 VITALS — BP 114/70 | HR 84 | Temp 96.9°F | Ht 65.0 in | Wt 161.0 lb

## 2020-02-18 DIAGNOSIS — R300 Dysuria: Secondary | ICD-10-CM

## 2020-02-18 LAB — POCT URINALYSIS DIPSTICK
Bilirubin, UA: NEGATIVE
Glucose, UA: NEGATIVE
Ketones, UA: NEGATIVE
Nitrite, UA: NEGATIVE
Protein, UA: POSITIVE — AB
Spec Grav, UA: 1.01 (ref 1.010–1.025)
Urobilinogen, UA: 0.2 E.U./dL
pH, UA: 7.5 (ref 5.0–8.0)

## 2020-02-18 NOTE — Patient Instructions (Signed)
Stay hydrated  Decrease/limit sugary drinks Can take cranberry tablet daily- this is OTC Awaiting urinary culture

## 2020-02-18 NOTE — Progress Notes (Signed)
Careteam: Patient Care Team: Lauree Chandler, NP as PCP - General (Geriatric Medicine)  PLACE OF SERVICE:  [place] [place] Directive information Does Patient Have a Medical Advance Directive?: Yes, Type of Advance Directive: Out of facility DNR (pink MOST or yellow form), Pre-existing out of facility DNR order (yellow form or pink MOST form): Yellow form placed in chart (order not valid for inpatient use);Pink MOST form placed in chart (order not valid for inpatient use), Does patient want to make changes to medical advance directive?: No - Patient declined  Allergies  Allergen Reactions  . Cafergot   . Codeine   . Fenoprofen Calcium   . Nalfon [Fenoprofen]     Chief Complaint  Patient presents with  . Acute Visit    Possible UTi, patient c/o burning when urinating and cloudy urine. Symptoms onset about 2 weeks ago, yesterday patient took 1 pill of an old antibiotic on Sunday and realized it made her feel yucky and she didnt take anymore (nitrofurontin).  Refill Alprazolam      HPI: Patient is a 74 y.o. female due to pain with urination.  Reports she drinks plenty of fluids during the day Cloudy urine, foul odor, pain when she urinates.  Increase frequency and urgency.  No fever  No flank pain  Pt reports she can no afford to go [place]. Request cologuard at this time.   Review of Systems:  Review of Systems  Constitutional: Negative for chills, fever and malaise/fatigue.  Genitourinary: Positive for dysuria, frequency and urgency. Negative for flank pain and hematuria.  Musculoskeletal: Negative for myalgias.    Past Medical History:  Diagnosis Date  . Chronic airway obstruction, not elsewhere classified   . Depressive disorder   . Depressive disorder, not elsewhere classified   . Emphysema   . Enthesopathy of ankle and tarsus, unspecified   . HTN (hypertension)   . Hyperlipidemia   . Hypertension   . Hypopotassemia   . Leukocytosis   . Migraine,  unspecified, without mention of intractable migraine without mention of status migrainosus   . Nonspecific (abnormal) findings on radiological and other examination of skull and head   . Osteoarthrosis, unspecified whether generalized or localized, unspecified site   . Other emphysema ([place])   . Other specified disease of white blood cells   . Palpitations   . [facility] spotted fever) 08/23/2015   Positive IgM. Treated with doxycycline.   . Tobacco abuse    Past Surgical History:  Procedure Laterality Date  . CESAREAN SECTION    . COLONOSCOPY  08/14/2008   Dr.. Delfin Edis   Social History:   reports that she has been smoking cigarettes. She has a 25.00 pack-year smoking history. She has never used smokeless tobacco. She reports that she does not drink alcohol and does not use drugs.  Family History  Problem Relation Age of Onset  . Alzheimer's disease Father   . Heart disease Father   . Diabetes Father   . Skin cancer Father   . Heart disease Mother   . Breast cancer Mother     Medications: Patient's Medications  New Prescriptions   No medications on file  Previous Medications   ACETAMINOPHEN (TYLENOL) 500 MG TABLET    Take 500 mg by mouth every 6 (six) hours as needed for mild pain or headache.   ALBUTEROL (VENTOLIN HFA) 108 (90 BASE) MCG/ACT INHALER    INHALE 2 PUFFS INTO THE LUNGS EVERY 6 HOURS AS  NEEDED FOR WHEEZING OR SHORTNESS OF BREATH   ALPRAZOLAM (XANAX) 0.5 MG TABLET    Take one tablet by mouth twice daily as needed for nervousness/sleep   ATORVASTATIN (LIPITOR) 40 MG TABLET    TAKE 1 TABLET BY MOUTH DAILY FOR HIGH CHOLESTEROL   BUDESON-GLYCOPYRROL-FORMOTEROL (BREZTRI AEROSPHERE) 160-9-4.8 MCG/ACT AERO    Inhale 2 puffs into the lungs 2 (two) times daily.   CHOLECALCIFEROL (VITAMIN D3) 1000 UNITS CAPS    Take 1 capsule by mouth daily.     DICLOFENAC SODIUM (VOLTAREN) 1 % GEL    Apply 2 g topically 4 (four) times daily as needed.   FAMOTIDINE (PEPCID) 20 MG  TABLET    Take 20 mg by mouth as needed for heartburn or indigestion.   LEVOTHYROXINE (SYNTHROID) 125 MCG TABLET    TAKE 1 TABLET(125 MCG) BY MOUTH DAILY   LOSARTAN (COZAAR) 50 MG TABLET    TAKE 1 TABLET(50 MG) BY MOUTH DAILY   PREDNISONE PO    Take by mouth as needed.   SERTRALINE (ZOLOFT) 100 MG TABLET    TAKE 1 TABLET(100 MG) BY MOUTH AT BEDTIME  Modified Medications   No medications on file  Discontinued Medications   No medications on file    Physical Exam:  Vitals:   02/18/20 1332  BP: 114/70  Pulse: 84  Temp: (!) 96.9 F (36.1 C)  TempSrc: Temporal  SpO2: 96%  Weight: 161 lb (73 kg)  Height: 5\' 5"  (1.651 m)   Body mass index is 26.79 kg/m. Wt Readings from Last 3 Encounters:  02/18/20 161 lb (73 kg)  01/14/20 165 lb (74.8 kg)  12/09/19 165 lb 9.6 oz (75.1 kg)    Physical Exam Constitutional:      Appearance: Normal appearance.  Abdominal:     General: Bowel sounds are normal. There is no distension.     Palpations: Abdomen is soft.     Tenderness: There is no abdominal tenderness. There is no right CVA tenderness or left CVA tenderness.  Skin:    General: Skin is warm and dry.  Neurological:     Mental Status: She is alert and oriented to person, place, and time.  Psychiatric:        Mood and Affect: Mood normal.        Behavior: Behavior normal.     Labs reviewed: Basic Metabolic Panel: Recent Labs    06/09/19 1105 06/10/19 0805 06/12/19 0444 06/12/19 0444 06/14/19 0536 06/20/19 1957 08/06/19 1519 09/08/19 1534 01/12/20 1053  NA 140   < > 142   < > 139   < > 140 143 142  K 4.0   < > 4.1   < > 4.1   < > 4.2 3.6 4.1  CL 103   < > 104   < > 101   < > 106 104 105  CO2 30   < > 28   < > 30   < > 25 33* 31  GLUCOSE 121*   < > 100*   < > 142*   < > 89 92 106*  BUN 15   < > 19   < > 23   < > 19 19 10   CREATININE 0.76   < > 0.64   < > 0.61   < > 0.75 0.95* 0.84  CALCIUM 9.4   < > 8.8*   < > 8.7*   < > 9.6 9.3 9.3  MG 2.1  --  2.2  --  2.3  --   --    --   --   PHOS 4.0  --   --   --   --   --   --   --   --   TSH  --   --   --   --   --   --   --  0.69  --    < > = values in this interval not displayed.   Liver Function Tests: Recent Labs    06/09/19 1105 09/08/19 1534 01/12/20 1053  AST 17 11 14   ALT 21 12 13   ALKPHOS 118  --   --   BILITOT 0.8 0.4 0.5  PROT 7.5 6.4 6.3  ALBUMIN 4.2  --   --    No results for input(s): LIPASE, AMYLASE in the last 8760 hours. No results for input(s): AMMONIA in the last 8760 hours. CBC: Recent Labs    08/06/19 1519 09/08/19 1534 01/12/20 1053  WBC 13.1* 15.9* 9.4  NEUTROABS 8,567* 9,588* 5,790  HGB 13.7 14.0 14.6  HCT 41.7 42.4 44.0  MCV 93.9 93.2 92.6  PLT 289 339 285   Lipid Panel: Recent Labs    01/12/20 1053  CHOL 165  HDL 62  LDLCALC 83  TRIG 103  CHOLHDL 2.7   TSH: Recent Labs    09/08/19 1534  TSH 0.69   A1C: Lab Results  Component Value Date   HGBA1C 5.3 08/06/2019     Assessment/Plan 1. Dysuria -encouraged proper hydration -decrease sugar in fluid -she does have incontinence of urine, encouraged to change pad frequency and not leave wet by skin.   - POC Urinalysis Dipstick abnormal with moderate leukocytes and blood. Sent for culture.  - Culture, Urine  Next appt: 05/19/2020 Carlos American. [place], [facility] [phone]

## 2020-02-20 ENCOUNTER — Ambulatory Visit: Payer: Medicare HMO | Admitting: Internal Medicine

## 2020-02-20 LAB — URINE CULTURE
MICRO NUMBER:: 11217800
SPECIMEN QUALITY:: ADEQUATE

## 2020-02-23 ENCOUNTER — Other Ambulatory Visit: Payer: Self-pay | Admitting: Nurse Practitioner

## 2020-02-23 MED ORDER — AMOXICILLIN-POT CLAVULANATE 875-125 MG PO TABS: 1.0000 | ORAL_TABLET | Freq: Two times a day (BID) | ORAL | 0 refills | Status: DC

## 2020-02-23 NOTE — Telephone Encounter (Signed)
Alprazolam last filled on 01/15/2020 in epic   Treatment agreement on file from 02/25/2020

## 2020-02-24 ENCOUNTER — Other Ambulatory Visit: Payer: Self-pay

## 2020-02-24 ENCOUNTER — Ambulatory Visit: Payer: Medicare HMO | Admitting: Internal Medicine

## 2020-02-24 ENCOUNTER — Encounter: Payer: Self-pay | Admitting: Internal Medicine

## 2020-02-24 DIAGNOSIS — F17210 Nicotine dependence, cigarettes, uncomplicated: Secondary | ICD-10-CM

## 2020-02-24 DIAGNOSIS — J9611 Chronic respiratory failure with hypoxia: Secondary | ICD-10-CM

## 2020-02-24 DIAGNOSIS — J449 Chronic obstructive pulmonary disease, unspecified: Secondary | ICD-10-CM

## 2020-02-24 MED ORDER — PREDNISONE 10 MG PO TABS: ORAL_TABLET | ORAL | 11 refills | Status: DC

## 2020-02-24 NOTE — Patient Instructions (Signed)
Plan A = Automatic = Always=   Breztri Take 2 puffs first thing in am and then another 2 puffs about 12 hours later.   Plan B = Backup (to supplement plan A, not to replace it) Only use your albuterol inhaler as a rescue medication to be used if you can't catch your breath by resting or doing a relaxed purse lip breathing pattern.  - The less you use it, the better it will work when you need it. - Ok to use the inhaler up to 2 puffs  every 4 hours if you must but call for appointment if use goes up over your usual need - Don't leave home without it !!  (think of it like the spare tire for your car)   Plan C = Crisis  = plan AB not working > Prednisone 10 mg take  4 each am x 2 days,   2 each am x 2 days,  1 each am x 2 days and stop    Please schedule a follow up visit in 6 months but call sooner if needed

## 2020-02-24 NOTE — Assessment & Plan Note (Signed)
4-5 min discussion re active cigarette smoking in addition to office E&M  Ask about tobacco use:   ongoing Advise quitting   Very fatalistic.  I emphasized that although we never turn away smokers from the pulmonary clinic, we do ask that they understand that the recommendations that we make  won't work nearly as well in the presence of continued cigarette exposure. In fact, we may very well  reach a point where we can't promise to help the patient if he/she can't quit smoking. (We can and will promise to try to help, we just can't promise what we recommend will really work)  Assess willingness:  Not committed at this point Assist in quit attempt:  Per PCP when ready Arrange follow up:   Follow up per Primary Care planned

## 2020-02-24 NOTE — Assessment & Plan Note (Addendum)
Placed on 02 at d/c from admit 06/15/19  -  08/14/2019 desats on RA walking corrected with 3lpm POC > ordered but not using as of 02/24/2020   Advised to monitor to be sure sats stay > 90% with adls          Each maintenance medication was reviewed in detail including emphasizing most importantly the difference between maintenance and prns and under what circumstances the prns are to be triggered using an action plan format where appropriate.  Total time for H and P, chart review, counseling, teaching device and generating customized AVS unique to this office visit / charting = 24 min

## 2020-02-24 NOTE — Progress Notes (Signed)
Brief patient profile:  74  yowf  MM/ active smoker with AB/GOLD 0 copd by pfts 03/08/15 and GOLD II criteria 11/28/2017     History of Present Illness  01/22/2015 acute extended re-establish ov/Gabrielle White re:  AB/ still smoking  Chief Complaint  Patient presents with  . Pulmonary Consult    pt last seen in 2014. pt states DR. Green wanted her to follow up. pt states somethines her throat feels like it closes up and she cant breath. pt states she feels like she has to take really deep breaths. pt c/o chest tightness, and prod cough white in color.pt c/o thrush she thiks may come from the inhailers.     Last better breathing  x 4 weeks prior to OV  p using prednisone but benefit only for a few days p stopped despite maint rx with symbicort (though hfa poor)  Nl routine is symbicort 160 2 bid and maybe ventolin and occ brovana no recent duoneb need  In retrospect really hasn't been able to really stay well x 4 y Cough is harsh and congested worse day than noct  rec Start Prednisone 10mg  Take 4 for two days three for two days two for two days one for two days  Plan A =  Automatic = symbicort 160 Take 2 puffs first thing in am and then another 2 puffs about 12 hours later.  Work on Interior and spatial designer: Plan B = Backup  Only use your albuterol  Plan C = crisis  Only use nebulizer iprotropium-albuterol (duoneb) if you try the ventolin first and it fails to help > ok to use up to every 4 hours  Plan D = doctor call us if not improving  Plan E = ER > go there if all else fails Try prilosec otc 20mg   Take 30-60 min before first meal of the day and Pepcid ac (famotidine) 20 mg one @  bedtime    GERD (REFLUX) diet             01/24/2019  f/u ov/Gabrielle White re: GOLD II/ still smoking maint rx symb 160 2bid  Chief Complaint  Patient presents with  . Follow-up    Breathing may be slightly better since the last visit- relates to cooler weather- using proair 2-3 x per day.   Dyspnea:  MMRC2  = can't walk a nl pace on a flat grade s sob but does fine slow and flat but by hour 10 wearing off symb and needs albuterol typically prior to pm dose of symb Cough: spells sporacic, white daytime mostly  Sleeping: sleeping on enough pillows to get 30 degrees  SABA use: as above  rec Plan A = Automatic = Always=    Breztri Take 2 puffs first thing in am and then another 2 puffs about 12 hours later.  Work on inhaler technique:    Plan B = Backup (to supplement plan A, not to replace it) Only use your albuterol inhaler as a rescue medication   Prednisone 10 mg take  4 each am x 2 days,   2 each am x 2 days,  1 each am x 2 days and stop  Please schedule a follow up office visit in 6 months, call sooner if needed     02/21/2019  f/u ov/Gabrielle White re: GOLD II/ still smoking/ problems with access to affordable meds / does not have pred refillable as Plan C Chief Complaint  Patient presents with  . Follow-up  Patient is here for follow up for COPD. Insurance denied [employer].  Dyspnea:  Walking end of road and back now x 15-20 min s stopping / slow pace = MMRC2 = can't walk a nl pace on a flat grade s sob but does fine slow and flat  Cough: some sporadic esp in am / minimal mucoid sputum   Sleeping: no resp problems SABA use: avg twice daily at most, less while on breztri rec Plan A = Automatic = Always=    Breztri = Bevespi (one or the other)  Take 2 puffs first thing in am and then another 2 puffs about 12 hours later.  Plan B = Backup (to supplement plan A, not to replace it) Only use your albuterol inhaler  Add Plan C: crisis If not improving or needing more albuterol than usual : Prednisone 10 mg take  4 each am x 2 days,   2 each am x 2 days,  1 each am x 2 days and stop  The key is to stop smoking completely before smoking completely stops you!   03/17/2019  f/u ov/Gabrielle White re:  GOLD II copd/ still smoking/ anoro and stiolto approved by [facility] but [place] her choice  will be  available Apr 04 2019  And using symb 160  2bid Chief Complaint  Patient presents with  . Follow-up    Pt states she had a COPD exacerbation 5 nights ago after she believes she might have taken too much anoro. Since the exacerbation, pt stated she felt like a "zombie" but now pt said she is getting better. Pt has occ cough with white to yellow phlegm.  Dyspnea:  No longer doing any outdoor walking  Cough: none  Sleeping: no resp symptoms  SABA use: once or twice daily  02: none  rec Plan A = Automatic = Always=    Breztri Take 2 puffs first thing in am and then another 2 puffs about 12 hours later.  Work on inhaler technique:   Plan B = Backup (to supplement plan A, not to replace it) Only use your albuterol inhaler (proair)  as a rescue medication  Plan C = Crisis (instead of Plan B but only if Plan B stops working) - Prednisone 10 mg take  4 each am x 2 days,   2 each am x 2 days,  1 each am x 2 days and stop    06/09/2019  f/u ov/Gabrielle White re:  GOLD II copd/ still smoking / breztri  Chief Complaint  Patient presents with  . Follow-up    Increased SOB x 3 days. She has been coughing more over the past couple of days- minimal clear sputum.   Dyspnea: very sedentary this winter / now acutely sob with min activity In setting of severe congested cough but only minimal white mucus prod Sleeping: baseline fine, only 2 hours night prior to ov due sob  SABA use: 3-4 x per week then acutely increased x  3 days prior to OV   02: none Did not try prednisone as "plan C" since getting covid shot 06/11/19 and didn't want to interfere with that rec Go directly to [place] ER for evaluation of new dx of respiratory failure/ copd exacerbation    Admit date: 06/09/2019 Discharge date: 06/15/2019   Principal Problem:   Acute respiratory failure with hypoxemia     Cigarette smoker   Hyperlipidemia   Hypothyroidism   Hyperglycemia   Essential hypertension   Depression  Polycythemia   COPD  exacerbation (HCC)   History of Present Illness:   Patient is 74 year old female with history of COPD/emphysema, anxiety, depression, insomnia, hypertension, hyperlipidemia, tobacco abuse who presented to the ED from her pulmonologist office (Dr. Melvyn Novas) due to progressively worsening dyspnea for 4 to 5 days with associated wheezing without responding to albuterol at home. Patient also had increased sputum production. ED Course:Initial vital signs temperature 98.9 F, pulse 92, respirations 22, blood pressure 132/81 mmHg and O2 sat 91% on room air. The patient was given bronchodilator and 60 mg of prednisone. She became hypoxic and tachypneic with ambulation, so our service was asked to evaluate for further treatment. CBC shows white count 11.3, hemoglobin 15.6 g/dL and platelets 322. PT 12.3 and INR 0.9. Venous blood gas showed a decreased PO2 of 48.30 mmHg and bicarbonate of 29.7 mmol/L. CMP showed a glucose of 121 mg/dL, but was otherwise normal. Lactic acid was 1.1 mmol/L. SARS coronavirus 2, influenza A and B by PCR was negative. Her chest radiograph did not have any acute cardiopulmonary disease, but showed hyperinflation compatible with emphysema.  Hospital Course:   Following conditions were addressed during hospitalization as listed below,  Acute hypoxic respiratory failure secondary to COPD exacerbation.Likely secondary to viral illness and ongoing smoking.. Patient was seen by pulmonary during hospitalization.. Patient likely has gold stage II COPD. Pulmonary has an impression that patient has been advancing in her COPD due to continued smoking as evidenced by declining FEV1. Patient continues to smoke until just recently. Patient was  optimized on bronchodilators steroids but persisted to need 3 L of oxygen.  Patient will need oxygen on discharge.  Patient was emphasized the need for quitting smoking, follow-up with your pulmonary physician and will consider ambulatory  pulmonary rehab follow-up.  Continue p.o. prednisone taper on discharge.  Essential hypertensionon losartan. Continue. Closely monitor  Polycythemia secondary to COPD.  Hyperlipidemiaon atorvastatin  Hypothyroidismon levothyroxine  Depression on sertraline  GERD Continue Protonix    08/14/2019  Post hospf/u ov/Gabrielle White re: not smoking / Breztri maint  Chief Complaint  Patient presents with  . Follow-up    pt states in hospital for week.pt uses rescue haler2 a week. pt states uses 3l of 02  Dyspnea:  Sitting usually fine / also supine / steps are a problem but ok slow flat = MMRC2 = can't walk a nl pace on a flat grade s sob but does fine slow and flat   Cough: still some rattling but much less am mucus production  Sleeping: bed is flat, pillows to 30 degrees SABA use: sev times  A day  02:  No longer using due to electric bill went up on it  rec Plan A = Automatic = Always=    Breztri Take 2 puffs first thing in am and then another 2 puffs about 12 hours later.  Work on Orthoptist B = Backup (to supplement plan A, not to replace it) Only use your albuterol inhaler as a rescue medication Ok to try albuterol 15 min before an activity t For cough /congestion > mucinex dm  1200 mg every 12 hours and use flutter valve as much as you can Pantoprazole (protonix) 40 mg   Take  30-60 min before first meal of the day and Pepcid (famotidine)  20 mg one after supper until return to office - this is the best way to tell whether stomach acid is contributing to your problem.   GERD  rx Make sure you  check your oxygen saturations at highest level of activity   Please schedule a follow up office visit in 4 weeks, sooner if needed  with all medications /inhalers/ solutions in hand so we can verify exactly what you are taking. This includes all medications from all doctors and over the counters   09/16/19  Cough/congestion/  2 different abx could not finish bactrim due to gi  issues  then levaquin rx   09/26/2019  f/u ov/Gabrielle White re: GOLD II still smoking / new leg swelling  Chief Complaint  Patient presents with  . Follow-up    Breathing is unchanged since the last visit. She is using her albuterol inhaler 3 x per day.    Dyspnea:  MMRC2 = can't walk a nl pace on a flat grade s sob but does fine slow and flat  Cough: sometimes after supper but not during the day/ nothing purulent  Sleeping: bed is flat / 30 degrees with pillows  SABA use:  Twice daily avg / up to 3 x daily at her worst  02: wants to take back today but hasn't checked sats yet  rec No change in medications - call if your drugstore runs out of [place] and I will call the company rep to fix it. For cough/ congestion:  mucinex or mucinex dm up to 1200 mg every 12 hours as needed The key is to stop smoking completely before smoking completely stops you! 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 Follow-up    SOB unchanged worse with hot/ cold weather   Dyspnea:  MMRC2 = can't walk a nl pace on a flat grade s sob but does fine slow and flat   Cough:  slt rattle nothing nasty Sleeping: no resp symptoms flat bed pillow to 30 degrees= baseline  SABA use: occ flare 02:  Still has it, rarely uses rec No change rx   02/24/2020  f/u ov/Gabrielle White re: GOLD II/ still smoking / breztri 2 bid/ rare saba  Chief Complaint  Patient presents with  . Follow-up    Breathing has been some better since last visit- relates to cooler weather. She is using her albuterol inhaler 3 x per wk on average.    Dyspnea:  Walks dog around neighborhood, some hills  Cough: still a little rattel, no mucus  Sleeping: on couch x sev years  SABA use: rarely 02: none    No obvious day to day or daytime variability or assoc excess/ purulent sputum or mucus plugs or hemoptysis or cp or chest  tightness, subjective wheeze or overt sinus or hb symptoms.   Sleeping as above without nocturnal  or early am exacerbation  of respiratory  c/o's or need for noct saba. Also denies any obvious fluctuation of symptoms with weather or environmental changes or other aggravating or alleviating factors except as outlined above   No unusual exposure hx or h/o childhood pna/ asthma or knowledge of premature birth.  Current Allergies, Complete Past Medical History, Past Surgical History, Family History, and Social History were reviewed in [employer] record.  ROS  The following are not active complaints unless bolded Hoarseness, sore throat, dysphagia, dental problems, itching, sneezing,  nasal congestion or discharge of excess mucus or purulent secretions, ear ache,   fever, chills, sweats, unintended wt loss or wt gain,  classically pleuritic or exertional cp,  orthopnea pnd or arm/hand swelling  or leg swelling, presyncope, palpitations, abdominal pain, anorexia, nausea, vomiting, diarrhea  or change in bowel habits or change in bladder habits, change in stools or change in urine, dysuria, hematuria,  rash, arthralgias, visual complaints, headache, numbness, weakness or ataxia or problems with walking or coordination,  change in mood or  memory.        Current Meds  Medication Sig  . acetaminophen (TYLENOL) 500 MG tablet Take 500 mg by mouth every 6 (six) hours as needed for mild pain or headache.  . albuterol (VENTOLIN HFA) 108 (90 Base) MCG/ACT inhaler INHALE 2 PUFFS INTO THE LUNGS EVERY 6 HOURS AS NEEDED FOR WHEEZING OR SHORTNESS OF BREATH  . ALPRAZolam (XANAX) 0.5 MG tablet TAKE 1 TABLET BY MOUTH TWICE DAILY AS NEEDED FOR NERVOUSNESS/SLEEP  . amoxicillin-clavulanate (AUGMENTIN) 875-125 MG tablet Take 1 tablet by mouth 2 (two) times daily.  Marland Kitchen atorvastatin (LIPITOR) 40 MG tablet TAKE 1 TABLET BY MOUTH DAILY FOR HIGH CHOLESTEROL  . Budeson-Glycopyrrol-Formoterol (BREZTRI  AEROSPHERE) 160-9-4.8 MCG/ACT AERO Inhale 2 puffs into the lungs 2 (two) times daily.  . Cholecalciferol (VITAMIN D3) 1000 UNITS CAPS Take 1 capsule by mouth daily.    . diclofenac Sodium (VOLTAREN) 1 % GEL Apply 2 g topically 4 (four) times daily as needed.  . famotidine (PEPCID) 20 MG tablet Take 20 mg by mouth as needed for heartburn or indigestion.  Marland Kitchen levothyroxine (SYNTHROID) 125 MCG tablet TAKE 1 TABLET(125 MCG) BY MOUTH DAILY  . losartan (COZAAR) 50 MG tablet TAKE 1 TABLET(50 MG) BY MOUTH DAILY  . PREDNISONE PO Take by mouth as needed.  . sertraline (ZOLOFT) 100 MG tablet TAKE 1 TABLET(100 MG) BY MOUTH AT BEDTIME               Objective:   Physical Exam    02/24/2020    162  11/20/2019      163 08/14/2019     168 06/09/2019       157  03/17/2019   157 02/21/2019   154  01/24/2019   156  12/11/2018       157  12/20/12 157      Vital signs reviewed  02/24/2020  - Note at rest 02 sats  97% on RA  amb wf nad /smoker's rattle      HEENT : pt wearing mask not removed for exam due to covid - 19 concerns.    NECK :  without JVD/Nodes/TM/ nl carotid upstrokes bilaterally   LUNGS: no acc muscle use,  Mild barrel  contour chest wall with bilateral insp / exp rhonchi s audible wheeze and  without cough on insp or exp maneuvers  and mild  Hyperresonant  to  percussion bilaterally     CV:  RRR  no s3 or murmur or increase in P2, and no edema   ABD:  soft and nontender with pos end  insp Hoover's  in the supine position. No bruits or organomegaly appreciated, bowel sounds nl  MS:   Nl gait/  ext warm without deformities, calf tenderness, cyanosis or clubbing No obvious joint restrictions   SKIN: warm and dry without lesions    NEURO:  alert, approp, nl sensorium with  no motor or cerebellar deficits apparent.                 Assessment & Plan:

## 2020-02-24 NOTE — Assessment & Plan Note (Signed)
Quit smoking 06/2019 - PFTs  11/18/10  FEV1  2.0 (95%) ratio 73 and no change p saba and DLCO 75 corrects to 100% -PFT's  03/08/2015  FEV1 1.85 (78 % ) ratio 73  p 5 % improvement from saba with DLCO  63 % corrects to 72 % for alv volume and erv 17   - added flutter 06/07/2015  - PFT's  08/16/2016  FEV1 1.42 (61 % ) ratio 71  p 8 % improvement FEV1 (and 20% FVC) from saba p symb 160  prior to study with DLCO  61/58 % corrects to 78 % for alv volume    - Allergy profile 08/16/2016 >  Eos 0.5 /  IgE  99 RAST neg -  ADDED prednisone x 6 days cycles 11/20/2016  - 05/23/2017  After extensive coaching HFA effectiveness =    90% - PFT's  11/28/2017  FEV1 1.27 (57 % ) ratio 63  p 6 % improvement from saba p symb x 160 x 2  prior to study with DLCO  61 % corrects to 81  % for alv volume   - alpha one AT screen 06/10/2018    MM level 141   - 01/24/2019    try Breztri 2bid and pred x 6 days > much better 02/21/2019  but can't afford so changed to bevespi plus prn pred for flares  - 03/17/2019    Try  beztri   - 08/14/2019  After extensive coaching inhaler device,  effectiveness =    90% > continue breztri - 02/24/2020 resumed pred x 6 days as PLAN C    Group D in terms of symptom/risk and laba/lama/ICS  therefore appropriate rx at this point >>>  breztri plus prednisone if not doing well p tries saba 1st  > rx x 6 d refillable

## 2020-02-25 ENCOUNTER — Telehealth: Payer: Self-pay | Admitting: *Deleted

## 2020-02-25 MED ORDER — ONDANSETRON 4 MG PO TBDP
4.0000 mg | ORAL_TABLET | Freq: Four times a day (QID) | ORAL | 0 refills | Status: DC | PRN
Start: 2020-02-25 — End: 2020-06-04

## 2020-02-25 NOTE — Telephone Encounter (Signed)
Patient called and stated that she received her antibiotics yesterday and took one, she didn't have a problem with it. Today she took one and started vomiting. Did eat with Food, black eyed peas.  Wonders if you could give her a Rx of Phenergan to help with the nausea  Pharmacy [facility]      I called and spoke with Janett Billow and she stated to call in Zofran 4mg  one every 6 hours as needed for nausea #30.    Patient notified and Rx sent to pharmacy.

## 2020-03-03 ENCOUNTER — Other Ambulatory Visit: Payer: Medicare HMO

## 2020-03-15 ENCOUNTER — Other Ambulatory Visit: Payer: Self-pay | Admitting: Nurse Practitioner

## 2020-03-17 DIAGNOSIS — J449 Chronic obstructive pulmonary disease, unspecified: Secondary | ICD-10-CM | POA: Diagnosis not present

## 2020-04-06 ENCOUNTER — Other Ambulatory Visit: Payer: Self-pay | Admitting: Physician Assistant

## 2020-04-06 DIAGNOSIS — Q046 Congenital cerebral cysts: Secondary | ICD-10-CM

## 2020-04-12 ENCOUNTER — Other Ambulatory Visit: Payer: Self-pay | Admitting: Nurse Practitioner

## 2020-04-17 DIAGNOSIS — J449 Chronic obstructive pulmonary disease, unspecified: Secondary | ICD-10-CM | POA: Diagnosis not present

## 2020-04-20 ENCOUNTER — Other Ambulatory Visit: Payer: Medicare HMO

## 2020-05-04 ENCOUNTER — Other Ambulatory Visit: Payer: Medicare HMO

## 2020-05-04 DIAGNOSIS — H04213 Epiphora due to excess lacrimation, bilateral lacrimal glands: Secondary | ICD-10-CM | POA: Diagnosis not present

## 2020-05-04 DIAGNOSIS — H35033 Hypertensive retinopathy, bilateral: Secondary | ICD-10-CM | POA: Diagnosis not present

## 2020-05-04 DIAGNOSIS — H3562 Retinal hemorrhage, left eye: Secondary | ICD-10-CM | POA: Diagnosis not present

## 2020-05-04 DIAGNOSIS — Z961 Presence of intraocular lens: Secondary | ICD-10-CM | POA: Diagnosis not present

## 2020-05-12 ENCOUNTER — Other Ambulatory Visit: Payer: Self-pay | Admitting: Nurse Practitioner

## 2020-05-13 DIAGNOSIS — Z1212 Encounter for screening for malignant neoplasm of rectum: Secondary | ICD-10-CM | POA: Diagnosis not present

## 2020-05-13 DIAGNOSIS — Z1211 Encounter for screening for malignant neoplasm of colon: Secondary | ICD-10-CM | POA: Diagnosis not present

## 2020-05-13 LAB — COLOGUARD: Cologuard: NEGATIVE

## 2020-05-14 ENCOUNTER — Ambulatory Visit
Admission: RE | Admit: 2020-05-14 | Discharge: 2020-05-14 | Disposition: A | Discharge status: Home / Self Care | Payer: Medicare HMO | Source: Ambulatory Visit | Attending: Physician Assistant | Admitting: Physician Assistant

## 2020-05-14 DIAGNOSIS — Q046 Congenital cerebral cysts: Secondary | ICD-10-CM

## 2020-05-14 DIAGNOSIS — R930 Abnormal findings on diagnostic imaging of skull and head, not elsewhere classified: Secondary | ICD-10-CM | POA: Diagnosis not present

## 2020-05-18 DIAGNOSIS — J449 Chronic obstructive pulmonary disease, unspecified: Secondary | ICD-10-CM | POA: Diagnosis not present

## 2020-05-19 ENCOUNTER — Ambulatory Visit: Payer: Medicare HMO | Admitting: Nurse Practitioner

## 2020-05-19 ENCOUNTER — Encounter: Payer: Self-pay | Admitting: *Deleted

## 2020-05-21 ENCOUNTER — Telehealth: Payer: Self-pay

## 2020-05-21 NOTE — Telephone Encounter (Signed)
-----   Message from Lauree Chandler, NP sent at 05/19/2020 12:03 PM EST ----- Please notify pts cologuard is negative.  Thank you  ----- Message ----- From: May, Anita A, CMA Sent: 05/19/2020  11:26 AM EST To: Lauree Chandler, NP  Abstracted to patient's chart

## 2020-05-21 NOTE — Telephone Encounter (Signed)
Left message on voicemail for patient to return call when available    Cologuard was already abstracted into patients chart

## 2020-05-26 NOTE — Telephone Encounter (Signed)
Left message on voicemail for patient to return call when available   

## 2020-06-04 ENCOUNTER — Ambulatory Visit (INDEPENDENT_AMBULATORY_CARE_PROVIDER_SITE_OTHER): Payer: Medicare HMO | Admitting: Internal Medicine

## 2020-06-04 ENCOUNTER — Encounter: Payer: Self-pay | Admitting: Internal Medicine

## 2020-06-04 ENCOUNTER — Other Ambulatory Visit: Payer: Self-pay

## 2020-06-04 VITALS — BP 118/72 | HR 76 | Temp 98.1°F | Resp 18 | Ht 65.0 in | Wt 156.2 lb

## 2020-06-04 DIAGNOSIS — R32 Unspecified urinary incontinence: Secondary | ICD-10-CM | POA: Diagnosis not present

## 2020-06-04 DIAGNOSIS — I10 Essential (primary) hypertension: Secondary | ICD-10-CM | POA: Diagnosis not present

## 2020-06-04 DIAGNOSIS — J9611 Chronic respiratory failure with hypoxia: Secondary | ICD-10-CM

## 2020-06-04 DIAGNOSIS — I700 Atherosclerosis of aorta: Secondary | ICD-10-CM

## 2020-06-04 DIAGNOSIS — E039 Hypothyroidism, unspecified: Secondary | ICD-10-CM

## 2020-06-04 DIAGNOSIS — F5101 Primary insomnia: Secondary | ICD-10-CM

## 2020-06-04 DIAGNOSIS — E7849 Other hyperlipidemia: Secondary | ICD-10-CM | POA: Diagnosis not present

## 2020-06-04 DIAGNOSIS — Z23 Encounter for immunization: Secondary | ICD-10-CM | POA: Diagnosis not present

## 2020-06-04 DIAGNOSIS — G4700 Insomnia, unspecified: Secondary | ICD-10-CM | POA: Insufficient documentation

## 2020-06-04 LAB — URINALYSIS, ROUTINE W REFLEX MICROSCOPIC
Bilirubin Urine: NEGATIVE
Ketones, ur: NEGATIVE
Nitrite: POSITIVE — AB
RBC / HPF: NONE SEEN (ref 0–?)
Specific Gravity, Urine: 1.025 (ref 1.000–1.030)
Total Protein, Urine: NEGATIVE
Urine Glucose: NEGATIVE
Urobilinogen, UA: 0.2 (ref 0.0–1.0)
pH: 6 (ref 5.0–8.0)

## 2020-06-04 MED ORDER — LEVOTHYROXINE SODIUM 125 MCG PO TABS: 125.0000 ug | ORAL_TABLET | Freq: Every day | ORAL | 3 refills | Status: DC

## 2020-06-04 MED ORDER — ALPRAZOLAM 0.5 MG PO TABS: 0.5000 mg | ORAL_TABLET | Freq: Every evening | ORAL | 5 refills | Status: DC | PRN

## 2020-06-04 MED ORDER — ATORVASTATIN CALCIUM 40 MG PO TABS: 40.0000 mg | ORAL_TABLET | Freq: Every day | ORAL | 3 refills | Status: DC

## 2020-06-04 MED ORDER — SERTRALINE HCL 100 MG PO TABS: 100.0000 mg | ORAL_TABLET | Freq: Every day | ORAL | 3 refills | Status: DC

## 2020-06-04 MED ORDER — LOSARTAN POTASSIUM 50 MG PO TABS: 50.0000 mg | ORAL_TABLET | Freq: Every day | ORAL | 3 refills | Status: DC

## 2020-06-04 NOTE — Assessment & Plan Note (Signed)
Taking statin and we did discuss how her smoking ongoing puts her at higher risk of heart attack or stroke.

## 2020-06-04 NOTE — Assessment & Plan Note (Signed)
Recent lipid panel normal and reviewed with patient. Refilled atorvastatin 40 mg daily and reiterated importance of this.

## 2020-06-04 NOTE — Assessment & Plan Note (Signed)
Checking U/A and culture. If normal can consider medication to help.

## 2020-06-04 NOTE — Assessment & Plan Note (Signed)
TSH normal within 1 year so will continue synthroid 125 mcg daily after review of records.

## 2020-06-04 NOTE — Patient Instructions (Addendum)
We have refilled the alprazolam and the other medicines.  Ask the pharmacist about the shingrix (shingles vaccine).   We have given you the pneumonia shot today.   We are checking the urine today.

## 2020-06-04 NOTE — Assessment & Plan Note (Signed)
BP at goal on losartan and reviewed recent labs from Nov 2021. Refilled today and no labs done today.

## 2020-06-04 NOTE — Progress Notes (Signed)
   Subjective:   Patient ID: Gabrielle White, female    DOB: 1945-10-14, 75 y.o.   MRN: [id number]  HPI The patient is a new 75 YO female coming in for insomnia (uses alprazolam nightly to help with sleep, has been out for some months as her past pcp did not want to prescribe, she had taken for years without escalation in dosing and without side effects) and thyroid (taking synthroid 125 mcg daily, denies heat or cold intolerance, denies dosing change in the last few years) and blood pressure (taking losartan, BP at goal, denies lightheadedness, denies chest pains or headaches) and urinary incontinence (some cloudiness, stable incontinence, frequency, treated with antibiotics in the fall and it is unclear if these helped) and tobacco abuse (she does not want to quit, severe COPD, seeing pulmonary, used to use oxygen at home but does not use any longer).    PMH, [facility], social history reviewed and updated  Review of Systems  Constitutional: Negative.   HENT: Negative.   Eyes: Negative.   Respiratory: Positive for cough and shortness of breath. Negative for chest tightness.   Cardiovascular: Negative for chest pain, palpitations and leg swelling.  Gastrointestinal: Negative for abdominal distention, abdominal pain, constipation, diarrhea, nausea and vomiting.  Musculoskeletal: Negative.   Skin: Negative.   Neurological: Negative.   Psychiatric/Behavioral: Positive for sleep disturbance.    Objective:  Physical Exam Constitutional:      Appearance: She is well-developed and well-nourished.  HENT:     Head: Normocephalic and atraumatic.  Eyes:     Extraocular Movements: EOM normal.  Cardiovascular:     Rate and Rhythm: Normal rate and regular rhythm.  Pulmonary:     Effort: Pulmonary effort is normal. No respiratory distress.     Breath sounds: Wheezing and rhonchi present. No rales.  Abdominal:     General: Bowel sounds are normal. There is no distension.     Palpations: Abdomen is  soft.     Tenderness: There is no abdominal tenderness. There is no rebound.  Musculoskeletal:        General: No edema.     Cervical back: Normal range of motion.  Skin:    General: Skin is warm and dry.  Neurological:     Mental Status: She is alert and oriented to person, place, and time.     Coordination: Coordination normal.  Psychiatric:        Mood and Affect: Mood and affect normal.     Vitals:   06/04/20 1357  BP: 118/72  Pulse: 76  Resp: 18  Temp: 98.1 F (36.7 C)  TempSrc: Oral  SpO2: 95%  Weight: 156 lb 3.2 oz (70.9 kg)  Height: 5\' 5"  (1.651 m)    This visit occurred during the SARS-CoV-2 public health emergency.  Safety protocols were in place, including screening questions prior to the visit, additional usage of staff PPE, and extensive cleaning of exam room while observing appropriate contact time as indicated for disinfecting solutions.   Assessment & Plan:  Pneumonia 23 given at visit

## 2020-06-04 NOTE — Assessment & Plan Note (Signed)
She is not using oxygen at home and has damaged lines. She is encouraged to either call home health for replacement parts to use when needed.

## 2020-06-04 NOTE — Assessment & Plan Note (Signed)
Longstanding and likely exacerbated by her breathlessness. Refill alprazolam #30 5 refills. Visit 3-6 months from now. Counseled about risk of dependence, falls, memory change and she wishes to continue.

## 2020-06-07 ENCOUNTER — Other Ambulatory Visit: Payer: Medicare HMO

## 2020-06-07 ENCOUNTER — Other Ambulatory Visit: Payer: Self-pay | Admitting: Internal Medicine

## 2020-06-07 LAB — URINE CULTURE

## 2020-06-07 MED ORDER — SULFAMETHOXAZOLE-TRIMETHOPRIM 800-160 MG PO TABS: 1.0000 | ORAL_TABLET | Freq: Two times a day (BID) | ORAL | 0 refills | Status: DC

## 2020-06-11 ENCOUNTER — Other Ambulatory Visit: Payer: Self-pay | Admitting: Internal Medicine

## 2020-06-15 DIAGNOSIS — J449 Chronic obstructive pulmonary disease, unspecified: Secondary | ICD-10-CM | POA: Diagnosis not present

## 2020-06-25 DIAGNOSIS — H04123 Dry eye syndrome of bilateral lacrimal glands: Secondary | ICD-10-CM | POA: Diagnosis not present

## 2020-06-25 DIAGNOSIS — H00021 Hordeolum internum right upper eyelid: Secondary | ICD-10-CM | POA: Diagnosis not present

## 2020-07-12 ENCOUNTER — Telehealth: Payer: Self-pay | Admitting: Internal Medicine

## 2020-07-12 DIAGNOSIS — R300 Dysuria: Secondary | ICD-10-CM

## 2020-07-12 NOTE — Telephone Encounter (Signed)
Unable to get in contact with the patient. LDVM with instructions from Dr. Sharlet Salina. Office number was provided so she can call and schedule a appointment.

## 2020-07-12 NOTE — Telephone Encounter (Signed)
Patient states the medication has been working for her, she feels it coming back and has requested a refill. If she needs to come in the office, pt said please give her a call  1.Medication Requested: sulfamethoxazole-trimethoprim (BACTRIM DS) 800-160 MG tablet  2. Pharmacy (Name, Street, [place]): [facility], [place] 3. On Med List: yes  4. Last Visit with PCP: 3.4.22  5. Next visit date with PCP: 6.7.22   Agent: Please be advised that RX refills may take up to 3 business days. We ask that you follow-up with your pharmacy.

## 2020-07-12 NOTE — Telephone Encounter (Signed)
Ok to refill? Please advise.  

## 2020-07-12 NOTE — Telephone Encounter (Signed)
Placed orders at lab to check for infection.

## 2020-07-13 DIAGNOSIS — H00021 Hordeolum internum right upper eyelid: Secondary | ICD-10-CM | POA: Diagnosis not present

## 2020-07-14 ENCOUNTER — Other Ambulatory Visit (INDEPENDENT_AMBULATORY_CARE_PROVIDER_SITE_OTHER): Payer: Medicare HMO

## 2020-07-14 ENCOUNTER — Other Ambulatory Visit: Payer: Self-pay

## 2020-07-14 DIAGNOSIS — R300 Dysuria: Secondary | ICD-10-CM | POA: Diagnosis not present

## 2020-07-15 ENCOUNTER — Other Ambulatory Visit: Payer: Self-pay | Admitting: Internal Medicine

## 2020-07-15 LAB — URINALYSIS, ROUTINE W REFLEX MICROSCOPIC
Bilirubin Urine: NEGATIVE
Ketones, ur: NEGATIVE
Nitrite: NEGATIVE
Specific Gravity, Urine: 1.02 (ref 1.000–1.030)
Urine Glucose: NEGATIVE
Urobilinogen, UA: 0.2 (ref 0.0–1.0)
pH: 6 (ref 5.0–8.0)

## 2020-07-15 LAB — URINE CULTURE

## 2020-07-15 MED ORDER — NITROFURANTOIN MONOHYD MACRO 100 MG PO CAPS: 100.0000 mg | ORAL_CAPSULE | Freq: Two times a day (BID) | ORAL | 0 refills | Status: DC

## 2020-07-16 DIAGNOSIS — J449 Chronic obstructive pulmonary disease, unspecified: Secondary | ICD-10-CM | POA: Diagnosis not present

## 2020-07-19 ENCOUNTER — Telehealth: Payer: Self-pay | Admitting: Internal Medicine

## 2020-07-19 NOTE — Telephone Encounter (Signed)
What antibiotic is she taking? We do not have record of any on our list.

## 2020-07-19 NOTE — Telephone Encounter (Signed)
Follow up message  Patient reports she is taking Cephalexin 500 MG capsule

## 2020-07-19 NOTE — Telephone Encounter (Signed)
See below

## 2020-07-19 NOTE — Telephone Encounter (Signed)
Patient states she went to the pharmacy to pick up the medication for the urine infection and the pharmacist told her to call us because she is already taking one antibiotic for a boil and doesn't think she should be taking another.

## 2020-07-19 NOTE — Telephone Encounter (Signed)
Unable to get in contact with the patient. LVM asking the patient to return my call here at the office in regards to what current antibiotic she is taking. Office number was provided.

## 2020-07-20 NOTE — Telephone Encounter (Signed)
If she is taking this keflex twice a day or more that should cover the UTI. If just taking daily I would recommend to pick up and fill what we prescribed.

## 2020-07-20 NOTE — Telephone Encounter (Signed)
See below

## 2020-07-21 NOTE — Telephone Encounter (Signed)
Spoke with the patient and she stated that she is taking Keflex twice day. No other questions or concerns at this time.

## 2020-07-27 DIAGNOSIS — H00021 Hordeolum internum right upper eyelid: Secondary | ICD-10-CM | POA: Diagnosis not present

## 2020-08-09 ENCOUNTER — Ambulatory Visit (INDEPENDENT_AMBULATORY_CARE_PROVIDER_SITE_OTHER): Payer: Medicare HMO | Admitting: Internal Medicine

## 2020-08-09 ENCOUNTER — Encounter: Payer: Self-pay | Admitting: Internal Medicine

## 2020-08-09 ENCOUNTER — Other Ambulatory Visit: Payer: Self-pay

## 2020-08-09 DIAGNOSIS — N3941 Urge incontinence: Secondary | ICD-10-CM

## 2020-08-09 DIAGNOSIS — I10 Essential (primary) hypertension: Secondary | ICD-10-CM

## 2020-08-09 MED ORDER — MIRABEGRON ER 50 MG PO TB24: 50.0000 mg | ORAL_TABLET | Freq: Every day | ORAL | 6 refills | Status: DC

## 2020-08-09 NOTE — Progress Notes (Signed)
   Subjective:   Patient ID: Gabrielle White, female    DOB: 1945/09/10, 75 y.o.   MRN: [id number]  HPI The patient is a 75 YO female coming in for bladder incontinence problems. She was having urinary tract infection originally which was treated and that helped some. But she is still having issues with making it to the bathroom. Does not feel like she has an infection. She denies pain with urination or burning. Denies fevers or chills. Denies abdominal pain. Loss of small volume and large volume urine depending on situation.   Review of Systems  Constitutional: Negative.   HENT: Negative.   Eyes: Negative.   Respiratory: Negative for cough, chest tightness and shortness of breath.   Cardiovascular: Negative for chest pain, palpitations and leg swelling.  Gastrointestinal: Negative for abdominal distention, abdominal pain, constipation, diarrhea, nausea and vomiting.  Genitourinary:       Incontinence  Musculoskeletal: Negative.   Skin: Negative.   Neurological: Negative.   Psychiatric/Behavioral: Negative.     Objective:  Physical Exam Constitutional:      Appearance: She is well-developed.  HENT:     Head: Normocephalic and atraumatic.  Cardiovascular:     Rate and Rhythm: Normal rate and regular rhythm.  Pulmonary:     Effort: Pulmonary effort is normal. No respiratory distress.     Breath sounds: Rhonchi present. No wheezing or rales.     Comments: At baseline Abdominal:     General: Bowel sounds are normal. There is no distension.     Palpations: Abdomen is soft.     Tenderness: There is no abdominal tenderness. There is no rebound.  Musculoskeletal:     Cervical back: Normal range of motion.  Skin:    General: Skin is warm and dry.  Neurological:     Mental Status: She is alert and oriented to person, place, and time.     Coordination: Coordination normal.     Vitals:   08/09/20 1536  BP: (!) 160/84  Pulse: 94  Temp: 98.6 F (37 C)  TempSrc: Oral  SpO2: 92%   Weight: 162 lb 3.2 oz (73.6 kg)  Height: 5\' 5"  (1.651 m)    This visit occurred during the SARS-CoV-2 public health emergency.  Safety protocols were in place, including screening questions prior to the visit, additional usage of staff PPE, and extensive cleaning of exam room while observing appropriate contact time as indicated for disinfecting solutions.   Assessment & Plan:

## 2020-08-09 NOTE — Patient Instructions (Addendum)
We will try myrbetriq daily to help with your bladder. Give it a month or so to see if this will work.

## 2020-08-11 ENCOUNTER — Telehealth: Payer: Self-pay | Admitting: Internal Medicine

## 2020-08-11 NOTE — Telephone Encounter (Signed)
Patient states when she was here on Monday her BP was really high and she is wondering if she needs to come in again to get it checked. She is also wondering if her toes and fingers being numb could have anything to do with her bp

## 2020-08-11 NOTE — Telephone Encounter (Signed)
Spoke to patient she is concerned about her fingers tingling for a couple of days. When she was here in the office on 08/09/2020 her blood pressure was high. She wants to know if she needs another office visit. See message below. Thanks.

## 2020-08-12 NOTE — Telephone Encounter (Signed)
She can check BP at home, I would recommend follow up 1 month from office visit.

## 2020-08-12 NOTE — Telephone Encounter (Signed)
Spoke with the patient and she verbalized understanding the instructions given by Dr. Sharlet Salina. She stated that she would go purchase a blood pressure cuff when she gets off work to monitor her BP at home. She is going to call us back later on today to schedule her 1 month follow up after she looks at her calendar. No other questions or concerns at this time.

## 2020-08-12 NOTE — Assessment & Plan Note (Signed)
Has taken oxybutynin and trospium previously per EMR. Would like to try myrbetriq.

## 2020-08-12 NOTE — Assessment & Plan Note (Signed)
Previously at goal, moderately elevated today due to not taking meds prior to visit. Will monitor closely.

## 2020-08-15 DIAGNOSIS — J449 Chronic obstructive pulmonary disease, unspecified: Secondary | ICD-10-CM | POA: Diagnosis not present

## 2020-08-23 ENCOUNTER — Other Ambulatory Visit: Payer: Self-pay | Admitting: Internal Medicine

## 2020-08-23 ENCOUNTER — Ambulatory Visit: Payer: Medicare HMO | Admitting: Internal Medicine

## 2020-08-23 ENCOUNTER — Encounter: Payer: Self-pay | Admitting: Internal Medicine

## 2020-08-23 ENCOUNTER — Other Ambulatory Visit: Payer: Self-pay

## 2020-08-23 DIAGNOSIS — R0789 Other chest pain: Secondary | ICD-10-CM

## 2020-08-23 DIAGNOSIS — J449 Chronic obstructive pulmonary disease, unspecified: Secondary | ICD-10-CM | POA: Diagnosis not present

## 2020-08-23 DIAGNOSIS — J9611 Chronic respiratory failure with hypoxia: Secondary | ICD-10-CM

## 2020-08-23 DIAGNOSIS — F17210 Nicotine dependence, cigarettes, uncomplicated: Secondary | ICD-10-CM

## 2020-08-23 NOTE — Assessment & Plan Note (Signed)
Active smoker - PFTs  11/18/10  FEV1  2.0 (95%) ratio 73 and no change p saba and DLCO 75 corrects to 100% -PFT's  03/08/2015  FEV1 1.85 (78 % ) ratio 73  p 5 % improvement from saba with DLCO  63 % corrects to 72 % for alv volume and erv 17   - added flutter 06/07/2015  - PFT's  08/16/2016  FEV1 1.42 (61 % ) ratio 71  p 8 % improvement FEV1 (and 20% FVC) from saba p symb 160  prior to study with DLCO  61/58 % corrects to 78 % for alv volume    - Allergy profile 08/16/2016 >  Eos 0.5 /  IgE  99 RAST neg -  ADDED prednisone x 6 days cycles 11/20/2016  - 05/23/2017  After extensive coaching HFA effectiveness =    90% - PFT's  11/28/2017  FEV1 1.27 (57 % ) ratio 63  p 6 % improvement from saba p symb x 160 x 2  prior to study with DLCO  61 % corrects to 81  % for alv volume   - alpha one AT screen 06/10/2018    MM level 141   - 01/24/2019    try Breztri 2bid and pred x 6 days > much better 02/21/2019  but can't afford so changed to bevespi plus prn pred for flares  - 03/17/2019    Try  beztri   - 08/14/2019  After extensive coaching inhaler device,  effectiveness =    90% > continue breztri - 02/24/2020 resumed pred x 6 days as PLAN C    Group D in terms of symptom/risk and laba/lama/ICS  therefore appropriate rx at this point >>>  Continue breztri, approp saba, and pred x 6 days and "plan C"  Re saba: I spent extra time with pt today reviewing appropriate use of albuterol for prn use on exertion with the following points: 1) saba is for relief of sob that does not improve by walking a slower pace or resting but rather if the pt does not improve after trying this first. 2) If the pt is convinced, as many are, that saba helps recover from activity faster then it's easy to tell if this is the case by re-challenging : ie stop, take the inhaler, then p 5 minutes try the exact same activity (intensity of workload) that just caused the symptoms and see if they are substantially diminished or not after saba 3) if  there is an activity that reproducibly causes the symptoms, try the saba 15 min before the activity on alternate days   If in fact the saba really does help, then fine to continue to use it prn but advised may need to look closer at the maintenance regimen being used to achieve better control of airways disease with exertion.

## 2020-08-23 NOTE — Assessment & Plan Note (Signed)
Placed on 02 at d/c from admit 06/15/19  -  08/14/2019 desats on RA walking corrected with 3lpm POC > ordered but not using as of 02/24/2020  -    08/23/2020   Walked RA  3 laps @ approx 226ft each @ moderat pace  stopped due to end of study sats still 94% mild sob  Does not qualify for amb 02 > continue noct 02 per Huey Romans

## 2020-08-23 NOTE — Assessment & Plan Note (Signed)
Recurrent x end of 2022 >>> rx as IBS as of 08/23/2020   Classic subdiaphragmatic pain pattern suggests ibs:  Stereotypical, migratory with a very limited distribution of pain locations, daytime, not usually exacerbated by exercise  or coughing, worse in sitting position, frequently associated with generalized abd bloating, not as likely to be present supine due to the dome effect of the diaphragm which  is  canceled in that position. Frequently these patients have had multiple negative GI workups and CT scans.  Treatment consists of avoiding foods that cause gas (especially boiled eggs, mexcican food but especially  beans and undercooked vegetables like  spinach and some salads)  and citrucel 1 heaping tsp twice daily with a large glass of water.  Pain should improve w/in 2 weeks and if not then consider further GI work up.             Each maintenance medication was reviewed in detail including emphasizing most importantly the difference between maintenance and prns and under what circumstances the prns are to be triggered using an action plan format where appropriate.  Total time for H and P, chart review, counseling, reviewing 02 and hfa device(s) , directly observing portions of ambulatory 02 saturation study/ and generating customized AVS unique to this office visit / same day charting  > 30 min

## 2020-08-23 NOTE — Addendum Note (Signed)
Addended by: Rosana Berger on: 08/23/2020 04:32 PM   Modules accepted: Orders

## 2020-08-23 NOTE — Assessment & Plan Note (Signed)
Counseled re importance of smoking cessation but did not meet time criteria for separate billing   °

## 2020-08-23 NOTE — Progress Notes (Signed)
Brief patient profile:  74  yowf  MM/ active smoker with AB/GOLD 0 copd by pfts 03/08/15 and GOLD II criteria 11/28/2017     History of Present Illness  01/22/2015 acute extended re-establish ov/Emonii Wienke re:  AB/ still smoking  Chief Complaint  Patient presents with  . Pulmonary Consult    pt last seen in 2014. pt states DR. Green wanted her to follow up. pt states somethines her throat feels like it closes up and she cant breath. pt states she feels like she has to take really deep breaths. pt c/o chest tightness, and prod cough white in color.pt c/o thrush she thiks may come from the inhailers.     Last better breathing  x 4 weeks prior to OV  p using prednisone but benefit only for a few days p stopped despite maint rx with symbicort (though hfa poor)  Nl routine is symbicort 160 2 bid and maybe ventolin and occ brovana no recent duoneb need  In retrospect really hasn't been able to really stay well x 4 y Cough is harsh and congested worse day than noct  rec Start Prednisone 10mg  Take 4 for two days three for two days two for two days one for two days  Plan A =  Automatic = symbicort 160 Take 2 puffs first thing in am and then another 2 puffs about 12 hours later.  Work on Interior and spatial designer: Plan B = Backup  Only use your albuterol  Plan C = crisis  Only use nebulizer iprotropium-albuterol (duoneb) if you try the ventolin first and it fails to help > ok to use up to every 4 hours  Plan D = doctor call us if not improving  Plan E = ER > go there if all else fails Try prilosec otc 20mg   Take 30-60 min before first meal of the day and Pepcid ac (famotidine) 20 mg one @  bedtime    GERD (REFLUX) diet             01/24/2019  f/u ov/Montgomery Rothlisberger re: GOLD II/ still smoking maint rx symb 160 2bid  Chief Complaint  Patient presents with  . Follow-up    Breathing may be slightly better since the last visit- relates to cooler weather- using proair 2-3 x per day.   Dyspnea:  MMRC2  = can't walk a nl pace on a flat grade s sob but does fine slow and flat but by hour 10 wearing off symb and needs albuterol typically prior to pm dose of symb Cough: spells sporacic, white daytime mostly  Sleeping: sleeping on enough pillows to get 30 degrees  SABA use: as above  rec Plan A = Automatic = Always=    Breztri Take 2 puffs first thing in am and then another 2 puffs about 12 hours later.  Work on inhaler technique:    Plan B = Backup (to supplement plan A, not to replace it) Only use your albuterol inhaler as a rescue medication   Prednisone 10 mg take  4 each am x 2 days,   2 each am x 2 days,  1 each am x 2 days and stop  Please schedule a follow up office visit in 6 months, call sooner if needed     02/21/2019  f/u ov/Katelynne Revak re: GOLD II/ still smoking/ problems with access to affordable meds / does not have pred refillable as Plan C Chief Complaint  Patient presents with  . Follow-up  Patient is here for follow up for COPD. Insurance denied [employer].  Dyspnea:  Walking end of road and back now x 15-20 min s stopping / slow pace = MMRC2 = can't walk a nl pace on a flat grade s sob but does fine slow and flat  Cough: some sporadic esp in am / minimal mucoid sputum   Sleeping: no resp problems SABA use: avg twice daily at most, less while on breztri rec Plan A = Automatic = Always=    Breztri = Bevespi (one or the other)  Take 2 puffs first thing in am and then another 2 puffs about 12 hours later.  Plan B = Backup (to supplement plan A, not to replace it) Only use your albuterol inhaler  Add Plan C: crisis If not improving or needing more albuterol than usual : Prednisone 10 mg take  4 each am x 2 days,   2 each am x 2 days,  1 each am x 2 days and stop  The key is to stop smoking completely before smoking completely stops you!   03/17/2019  f/u ov/Fiza Nation re:  GOLD II copd/ still smoking/ anoro and stiolto approved by [facility] but [place] her choice  will be  available Apr 04 2019  And using symb 160  2bid Chief Complaint  Patient presents with  . Follow-up    Pt states she had a COPD exacerbation 5 nights ago after she believes she might have taken too much anoro. Since the exacerbation, pt stated she felt like a "zombie" but now pt said she is getting better. Pt has occ cough with white to yellow phlegm.  Dyspnea:  No longer doing any outdoor walking  Cough: none  Sleeping: no resp symptoms  SABA use: once or twice daily  02: none  rec Plan A = Automatic = Always=    Breztri Take 2 puffs first thing in am and then another 2 puffs about 12 hours later.  Work on inhaler technique:   Plan B = Backup (to supplement plan A, not to replace it) Only use your albuterol inhaler (proair)  as a rescue medication  Plan C = Crisis (instead of Plan B but only if Plan B stops working) - Prednisone 10 mg take  4 each am x 2 days,   2 each am x 2 days,  1 each am x 2 days and stop    06/09/2019  f/u ov/Raevyn Sokol re:  GOLD II copd/ still smoking / breztri  Chief Complaint  Patient presents with  . Follow-up    Increased SOB x 3 days. She has been coughing more over the past couple of days- minimal clear sputum.   Dyspnea: very sedentary this winter / now acutely sob with min activity In setting of severe congested cough but only minimal white mucus prod Sleeping: baseline fine, only 2 hours night prior to ov due sob  SABA use: 3-4 x per week then acutely increased x  3 days prior to OV   02: none Did not try prednisone as "plan C" since getting covid shot 06/11/19 and didn't want to interfere with that rec Go directly to [place] ER for evaluation of new dx of respiratory failure/ copd exacerbation    Admit date: 06/09/2019 Discharge date: 06/15/2019   Principal Problem:   Acute respiratory failure with hypoxemia     Cigarette smoker   Hyperlipidemia   Hypothyroidism   Hyperglycemia   Essential hypertension   Depression  Polycythemia   COPD  exacerbation (HCC)   History of Present Illness:   Patient is 75 year old female with history of COPD/emphysema, anxiety, depression, insomnia, hypertension, hyperlipidemia, tobacco abuse who presented to the ED from her pulmonologist office (Dr. Melvyn Novas) due to progressively worsening dyspnea for 4 to 5 days with associated wheezing without responding to albuterol at home. Patient also had increased sputum production. ED Course:Initial vital signs temperature 98.9 F, pulse 92, respirations 22, blood pressure 132/81 mmHg and O2 sat 91% on room air. The patient was given bronchodilator and 60 mg of prednisone. She became hypoxic and tachypneic with ambulation, so our service was asked to evaluate for further treatment. CBC shows white count 11.3, hemoglobin 15.6 g/dL and platelets 322. PT 12.3 and INR 0.9. Venous blood gas showed a decreased PO2 of 48.30 mmHg and bicarbonate of 29.7 mmol/L. CMP showed a glucose of 121 mg/dL, but was otherwise normal. Lactic acid was 1.1 mmol/L. SARS coronavirus 2, influenza A and B by PCR was negative. Her chest radiograph did not have any acute cardiopulmonary disease, but showed hyperinflation compatible with emphysema.  Hospital Course:   Following conditions were addressed during hospitalization as listed below,  Acute hypoxic respiratory failure secondary to COPD exacerbation.Likely secondary to viral illness and ongoing smoking.. Patient was seen by pulmonary during hospitalization.. Patient likely has gold stage II COPD. Pulmonary has an impression that patient has been advancing in her COPD due to continued smoking as evidenced by declining FEV1. Patient continues to smoke until just recently. Patient was  optimized on bronchodilators steroids but persisted to need 3 L of oxygen.  Patient will need oxygen on discharge.  Patient was emphasized the need for quitting smoking, follow-up with your pulmonary physician and will consider ambulatory  pulmonary rehab follow-up.  Continue p.o. prednisone taper on discharge.  Essential hypertensionon losartan. Continue. Closely monitor  Polycythemia secondary to COPD.  Hyperlipidemiaon atorvastatin  Hypothyroidismon levothyroxine  Depression on sertraline  GERD Continue Protonix    08/14/2019  Post hospf/u ov/Jazline Cumbee re: not smoking / Breztri maint  Chief Complaint  Patient presents with  . Follow-up    pt states in hospital for week.pt uses rescue haler2 a week. pt states uses 3l of 02  Dyspnea:  Sitting usually fine / also supine / steps are a problem but ok slow flat = MMRC2 = can't walk a nl pace on a flat grade s sob but does fine slow and flat   Cough: still some rattling but much less am mucus production  Sleeping: bed is flat, pillows to 30 degrees SABA use: sev times  A day  02:  No longer using due to electric bill went up on it  rec Plan A = Automatic = Always=    Breztri Take 2 puffs first thing in am and then another 2 puffs about 12 hours later.  Work on Orthoptist B = Backup (to supplement plan A, not to replace it) Only use your albuterol inhaler as a rescue medication Ok to try albuterol 15 min before an activity t For cough /congestion > mucinex dm  1200 mg every 12 hours and use flutter valve as much as you can Pantoprazole (protonix) 40 mg   Take  30-60 min before first meal of the day and Pepcid (famotidine)  20 mg one after supper until return to office - this is the best way to tell whether stomach acid is contributing to your problem.   GERD  rx Make sure you  check your oxygen saturations at highest level of activity   Please schedule a follow up office visit in 4 weeks, sooner if needed  with all medications /inhalers/ solutions in hand so we can verify exactly what you are taking. This includes all medications from all doctors and over the counters   09/16/19  Cough/congestion/  2 different abx could not finish bactrim due to gi  issues  then levaquin rx   09/26/2019  f/u ov/Marcy Sookdeo re: GOLD II still smoking / new leg swelling  Chief Complaint  Patient presents with  . Follow-up    Breathing is unchanged since the last visit. She is using her albuterol inhaler 3 x per day.    Dyspnea:  MMRC2 = can't walk a nl pace on a flat grade s sob but does fine slow and flat  Cough: sometimes after supper but not during the day/ nothing purulent  Sleeping: bed is flat / 30 degrees with pillows  SABA use:  Twice daily avg / up to 3 x daily at her worst  02: wants to take back today but hasn't checked sats yet  rec No change in medications - call if your drugstore runs out of [place] and I will call the company rep to fix it. For cough/ congestion:  mucinex or mucinex dm up to 1200 mg every 12 hours as needed The key is to stop smoking completely before smoking completely stops you! 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 Follow-up    SOB unchanged worse with hot/ cold weather   Dyspnea:  MMRC2 = can't walk a nl pace on a flat grade s sob but does fine slow and flat   Cough:  slt rattle nothing nasty Sleeping: no resp symptoms flat bed pillow to 30 degrees= baseline  SABA use: occ flare 02:  Still has it, rarely uses rec No change rx   02/24/2020  f/u ov/Crickett Abbett re: GOLD II/ still smoking / breztri 2 bid/ rare saba  Chief Complaint  Patient presents with  . Follow-up    Breathing has been some better since last visit- relates to cooler weather. She is using her albuterol inhaler 3 x per wk on average.    Dyspnea:  Walks dog around neighborhood, some hills  Cough: still a little rattel, no mucus  Sleeping: on couch x sev years  SABA use: rarely 02: none  rec Plan A = Automatic = Always=   Breztri Take 2 puffs first thing in am and then another 2 puffs about 12 hours later.  Plan  B = Backup (to supplement plan A, not to replace it) Only use your albuterol inhaler as a rescue medication Plan C = Crisis  = plan AB not working > Prednisone 10 mg take  4 each am x 2 days,   2 each am x 2 days,  1 each am x 2 days and stop   08/23/2020  f/u ov/Tyrion Glaude re: GOLD II on breztri / smoking on prn pred as Plan C Chief Complaint  Patient presents with  . Follow-up    Cough has been slightly worse "or maybe the same"- prod with white sputum. She has occ SOB and chest tightness.  She is using her albuterol inhaler 3 x per day on average.   Dyspnea: walks dog around neighborhood/ no  recent pred rx  Cough: smoker's rattle in am / nothing purulent Sleeping: on couch x sev years  SABA use: varies,never pretreats  02: has concentrator, broken, wants to qualify for POC  Covid status:  Vax x 3  Pain until R> L rib waxes and wanes x 6 m sitting, never supine   No obvious day to day or daytime variability or assoc excess/ purulent sputum or mucus plugs or hemoptysis or  chest tightness, subjective wheeze or overt sinus or hb symptoms.   Sleeping  without nocturnal  or early am exacerbation  of respiratory  c/o's or need for noct saba. Also denies any obvious fluctuation of symptoms with weather or environmental changes or other aggravating or alleviating factors except as outlined above   No unusual exposure hx or h/o childhood pna/ asthma or knowledge of premature birth.  Current Allergies, Complete Past Medical History, Past Surgical History, Family History, and Social History were reviewed in [employer] record.  ROS  The following are not active complaints unless bolded Hoarseness, sore throat, dysphagia, dental problems, itching, sneezing,  nasal congestion or discharge of excess mucus or purulent secretions, ear ache,   fever, chills, sweats, unintended wt loss or wt gain, classically pleuritic or exertional cp,  orthopnea pnd or arm/hand swelling  or leg  swelling, presyncope, palpitations, abdominal pain, anorexia, nausea, vomiting, diarrhea  or change in bowel habits or change in bladder habits, change in stools or change in urine, dysuria, hematuria,  rash, arthralgias, visual complaints, headache, numbness, weakness or ataxia or problems with walking or coordination,  change in mood or  memory.        Current Meds  Medication Sig  . acetaminophen (TYLENOL) 500 MG tablet Take 500 mg by mouth every 6 (six) hours as needed for mild pain or headache.  . albuterol (VENTOLIN HFA) 108 (90 Base) MCG/ACT inhaler INHALE 2 PUFFS INTO THE LUNGS EVERY 6 HOURS AS NEEDED FOR WHEEZING OR SHORTNESS OF BREATH  . atorvastatin (LIPITOR) 40 MG tablet Take 1 tablet (40 mg total) by mouth daily.  . Budeson-Glycopyrrol-Formoterol (BREZTRI AEROSPHERE) 160-9-4.8 MCG/ACT AERO Inhale 2 puffs into the lungs 2 (two) times daily.  . Cholecalciferol (VITAMIN D3) 1000 UNITS CAPS Take 1 capsule by mouth daily.  . diclofenac Sodium (VOLTAREN) 1 % GEL Apply 2 g topically 4 (four) times daily as needed.  . famotidine (PEPCID) 20 MG tablet Take 20 mg by mouth as needed for heartburn or indigestion.  Marland Kitchen levothyroxine (SYNTHROID) 125 MCG tablet Take 1 tablet (125 mcg total) by mouth daily before breakfast.  . losartan (COZAAR) 50 MG tablet Take 1 tablet (50 mg total) by mouth daily.  . mirabegron ER (MYRBETRIQ) 50 MG TB24 tablet Take 1 tablet (50 mg total) by mouth daily.  . sertraline (ZOLOFT) 100 MG tablet Take 1 tablet (100 mg total) by mouth at bedtime.                  Objective:   Physical Exam     08/23/2020      159  02/24/2020    162  11/20/2019      163 08/14/2019     168 06/09/2019       157  03/17/2019   157 02/21/2019   154  01/24/2019   156  12/11/2018       157  12/20/12 157     Vital signs reviewed  08/23/2020  - Note at rest 02 sats   99%  on RA   General appearance:   amb anxious wf nad   HEENT : pt wearing mask not removed for exam due to covid -19  concerns.    NECK :  without JVD/Nodes/TM/ nl carotid upstrokes bilaterally   LUNGS: no acc muscle use,  Mod barrel  contour chest wall with bilateral  Distant   wheeze and  without cough on insp or exp maneuvers and mod  Hyperresonant  to  percussion bilaterally     CV:  RRR  no s3 or murmur or increase in P2, and no edema   ABD:  soft and nontender with pos mid insp Hoover's  in the supine position. No bruits or organomegaly appreciated, bowel sounds nl  MS:     ext warm without deformities, calf tenderness, cyanosis or clubbing No obvious joint restrictions   SKIN: warm and dry without lesions    NEURO:  alert, approp, nl sensorium with  no motor or cerebellar deficits apparent.               Assessment & Plan:

## 2020-08-23 NOTE — Patient Instructions (Addendum)
Continue 3lpm at bedtime thru [facility] - call if issues with equipment that they can't solve with the person's name and extention  We will try to qualify you today for portable 02  Make sure you check your oxygen saturation  at your highest level of activity  to be sure it stays over 90% and adjust  02 flow upward to maintain this level if needed but remember to turn it back to previous settings when you stop (to conserve your supply).    The key is to stop smoking completely before smoking completely stops you!  We will refer back to our lung screening program thru Eric Form NP    Classic subdiaphragmatic pain pattern suggests ibs:  Stereotypical, migratory with a very limited distribution of pain locations, daytime, not usually exacerbated by exercise  or coughing, worse in sitting position, frequently associated with generalized abd bloating, not as likely to be present supine due to the dome effect of the diaphragm which  is  canceled in that position. Frequently these patients have had multiple negative GI workups and CT scans.  Treatment consists of avoiding foods that cause gas (especially boiled eggs, mexcican food but especially  beans and undercooked vegetables like  spinach and some salads)  and citrucel 1 heaping tsp twice daily with a large glass of water.  Pain should improve w/in 2 weeks and if not then consider further GI work up.     Please schedule a follow up visit in 6 months but call sooner if needed

## 2020-08-24 ENCOUNTER — Other Ambulatory Visit: Payer: Self-pay | Admitting: Internal Medicine

## 2020-08-24 DIAGNOSIS — J9611 Chronic respiratory failure with hypoxia: Secondary | ICD-10-CM

## 2020-08-24 DIAGNOSIS — H00021 Hordeolum internum right upper eyelid: Secondary | ICD-10-CM | POA: Diagnosis not present

## 2020-08-24 NOTE — Telephone Encounter (Signed)
Please see message below: : predniSONE (DELTASONE) 10 MG tablet    The original prescription was discontinued on 05/26/2019 by Despina Hidden, CMA for the following reason: Completed Course. Renewing this prescription may not be appropriate.   Last seen in office with you.  Would you like to send this in?

## 2020-08-25 ENCOUNTER — Telehealth: Payer: Self-pay | Admitting: Internal Medicine

## 2020-08-25 NOTE — Telephone Encounter (Signed)
Patient called and said that she thinks she has a UTI. She was wondering if something could be called in for it. She was also wondering if something for incontinence could be called in as well. She said that she thinks Dr. Sharlet Salina called something in for a UTI a few weeks ago and the pharmacy never received it. She said that it can be sent to [place] [place]

## 2020-08-25 NOTE — Telephone Encounter (Signed)
Do you want me to schedule the patient a visit? Please advise

## 2020-08-26 MED ORDER — NITROFURANTOIN MONOHYD MACRO 100 MG PO CAPS: 100.0000 mg | ORAL_CAPSULE | Freq: Two times a day (BID) | ORAL | 0 refills | Status: DC

## 2020-08-26 NOTE — Telephone Encounter (Signed)
We had sent in myrbetriq to pharmacy for incontinence can resend if pharmacy did not receive. Sent in Fleming for possible infection 1 pill twice a day for 1 week.

## 2020-08-27 NOTE — Telephone Encounter (Signed)
LVM with instructions from Dr. Sharlet Salina. Office number was provided in case she has additional questions or concerns.

## 2020-09-02 ENCOUNTER — Other Ambulatory Visit: Payer: Self-pay | Admitting: *Deleted

## 2020-09-02 DIAGNOSIS — Z87891 Personal history of nicotine dependence: Secondary | ICD-10-CM

## 2020-09-02 DIAGNOSIS — F17210 Nicotine dependence, cigarettes, uncomplicated: Secondary | ICD-10-CM

## 2020-09-07 ENCOUNTER — Ambulatory Visit: Payer: Medicare HMO | Admitting: Internal Medicine

## 2020-09-15 DIAGNOSIS — J449 Chronic obstructive pulmonary disease, unspecified: Secondary | ICD-10-CM | POA: Diagnosis not present

## 2020-10-11 ENCOUNTER — Ambulatory Visit (INDEPENDENT_AMBULATORY_CARE_PROVIDER_SITE_OTHER): Payer: Medicare HMO | Admitting: Acute Care

## 2020-10-11 ENCOUNTER — Other Ambulatory Visit: Payer: Self-pay

## 2020-10-11 ENCOUNTER — Ambulatory Visit
Admission: RE | Admit: 2020-10-11 | Discharge: 2020-10-11 | Disposition: A | Discharge status: Home / Self Care | Payer: Medicare HMO | Source: Ambulatory Visit | Attending: Acute Care | Admitting: Acute Care

## 2020-10-11 ENCOUNTER — Encounter: Payer: Self-pay | Admitting: Acute Care

## 2020-10-11 VITALS — BP 124/70 | HR 84 | Temp 97.6°F | Ht 65.0 in | Wt 167.4 lb

## 2020-10-11 DIAGNOSIS — Z87891 Personal history of nicotine dependence: Secondary | ICD-10-CM

## 2020-10-11 DIAGNOSIS — F17210 Nicotine dependence, cigarettes, uncomplicated: Secondary | ICD-10-CM

## 2020-10-11 DIAGNOSIS — J439 Emphysema, unspecified: Secondary | ICD-10-CM | POA: Diagnosis not present

## 2020-10-11 DIAGNOSIS — H903 Sensorineural hearing loss, bilateral: Secondary | ICD-10-CM | POA: Diagnosis not present

## 2020-10-11 DIAGNOSIS — I700 Atherosclerosis of aorta: Secondary | ICD-10-CM | POA: Diagnosis not present

## 2020-10-11 DIAGNOSIS — I2510 Atherosclerotic heart disease of native coronary artery without angina pectoris: Secondary | ICD-10-CM | POA: Diagnosis not present

## 2020-10-11 NOTE — Patient Instructions (Signed)
Thank you for participating in the [facility] Cancer Screening Program. It was our pleasure to meet you today. We will call you with the results of your scan within the next few days. Your scan will be assigned a Lung RADS category score by the physicians reading the scans.  This Lung RADS score determines follow up scanning.  See below for description of categories, and follow up screening recommendations. We will be in touch to schedule your follow up screening annually or based on recommendations of our providers. We will fax a copy of your scan results to your Primary Care Physician, or the physician who referred you to the program, to ensure they have the results. Please call the office if you have any questions or concerns regarding your scanning experience or results.  Our office number is [phone]. Please speak with Denise Phelps, RN. She is our Lung Cancer Screening RN. If she is unavailable when you call, please have the office staff send her a message. She will return your call at her earliest convenience. Remember, if your scan is normal, we will scan you annually as long as you continue to meet the criteria for the program. (Age 55-77, Current smoker or smoker who has quit within the last 15 years). If you are a smoker, remember, quitting is the single most powerful action that you can take to decrease your risk of lung cancer and other pulmonary, breathing related problems. We know quitting is hard, and we are here to help.  Please let us know if there is anything we can do to help you meet your goal of quitting. If you are a former smoker, congratulations. We are proud of you! Remain smoke free! Remember you can refer friends or family members through the number above.  We will screen them to make sure they meet criteria for the program. Thank you for helping us take better care of you by participating in Lung Screening.  Lung RADS Categories:  Lung RADS 1: no nodules  or definitely non-concerning nodules.  Recommendation is for a repeat annual scan in 12 months.  Lung RADS 2:  nodules that are non-concerning in appearance and behavior with a very low likelihood of becoming an active cancer. Recommendation is for a repeat annual scan in 12 months.  Lung RADS 3: nodules that are probably non-concerning , includes nodules with a low likelihood of becoming an active cancer.  Recommendation is for a 6-month repeat screening scan. Often noted after an upper respiratory illness. We will be in touch to make sure you have no questions, and to schedule your 6-month scan.  Lung RADS 4 A: nodules with concerning findings, recommendation is most often for a follow up scan in 3 months or additional testing based on our provider's assessment of the scan. We will be in touch to make sure you have no questions and to schedule the recommended 3 month follow up scan.  Lung RADS 4 B:  indicates findings that are concerning. We will be in touch with you to schedule additional diagnostic testing based on our provider's  assessment of the scan.   

## 2020-10-11 NOTE — Progress Notes (Signed)
Shared Decision Making Visit Lung Cancer Screening Program [phone])   Eligibility: Age 75 y.o. Pack Years Smoking History Calculation 37 pack year smoking history (# packs/per year x # years smoked) Recent History of coughing up blood  no Unexplained weight loss? no ( >Than 15 pounds within the last 6 months ) Prior History Lung / other cancer no (Diagnosis within the last 5 years already requiring surveillance chest CT Scans). Smoking Status Current Smoker Former Smokers: Years since quit:   Quit Date:   Visit Components: Discussion included one or more decision making aids. yes Discussion included risk/benefits of screening. yes Discussion included potential follow up diagnostic testing for abnormal scans. yes Discussion included meaning and risk of over diagnosis. yes Discussion included meaning and risk of False Positives. yes Discussion included meaning of total radiation exposure. yes  Counseling Included: Importance of adherence to annual lung cancer LDCT screening. yes Impact of comorbidities on ability to participate in the program. yes Ability and willingness to under diagnostic treatment. yes  Smoking Cessation Counseling: Current Smokers:  Discussed importance of smoking cessation. yes Information about tobacco cessation classes and interventions provided to patient. yes Patient provided with "ticket" for LDCT Scan. yes Symptomatic Patient. no  Counseling Diagnosis Code: Tobacco Use Z72.0 Asymptomatic Patient yes  Counseling (Intermediate counseling: > three minutes counseling) D7824 Former Smokers:  Discussed the importance of maintaining cigarette abstinence. yes Diagnosis Code: Personal History of Nicotine Dependence. M35.361 Information about tobacco cessation classes and interventions provided to patient. Yes Patient provided with "ticket" for LDCT Scan. yes Written Order for Lung Cancer Screening with LDCT placed in Epic. Yes (CT Chest Lung Cancer  Screening Low Dose W/O CM) [id number] Z12.2-Screening of respiratory organs Z87.891-Personal history of nicotine dependence  I have spent 25 minutes of face to face time with Ms. Florance discussing the risks and benefits of lung cancer screening. We viewed a power point together that explained in detail the above noted topics. We paused at intervals to allow for questions to be asked and answered to ensure understanding.We discussed that the single most powerful action that she can take to decrease her risk of developing lung cancer is to quit smoking. We discussed whether or not she is ready to commit to setting a quit date. We discussed options for tools to aid in quitting smoking including nicotine replacement therapy, non-nicotine medications, support groups, Quit Smart classes, and behavior modification. We discussed that often times setting smaller, more achievable goals, such as eliminating 1 cigarette a day for a week and then 2 cigarettes a day for a week can be helpful in slowly decreasing the number of cigarettes smoked. This allows for a sense of accomplishment as well as providing a clinical benefit. I gave her the " Be Stronger Than Your Excuses" card with contact information for community resources, classes, free nicotine replacement therapy, and access to mobile apps, text messaging, and on-line smoking cessation help. I have also given her my card and contact information in the event she needs to contact me. We discussed the time and location of the scan, and that either Doroteo Glassman RN or I will call with the results within 24-48 hours of receiving them. I have offered her  a copy of the power point we viewed  as a resource in the event they need reinforcement of the concepts we discussed today in the office. The patient verbalized understanding of all of  the above and had no further questions upon leaving the office. They have  my contact information in the event they have any further  questions.  I spent 4-5 minutes counseling on smoking cessation and the health risks of continued tobacco abuse.  I explained to the patient that there has been a high incidence of coronary artery disease noted on these exams. I explained that this is a non-gated exam therefore degree or severity cannot be determined. This patient is currently on statin therapy. I have asked the patient to follow-up with their PCP regarding any incidental finding of coronary artery disease and management with diet or medication as their PCP  feels is clinically indicated. The patient verbalized understanding of the above and had no further questions upon completion of the visit.       Magdalen Spatz, NP 10/11/2020

## 2020-10-15 DIAGNOSIS — J449 Chronic obstructive pulmonary disease, unspecified: Secondary | ICD-10-CM | POA: Diagnosis not present

## 2020-10-18 ENCOUNTER — Telehealth: Payer: Self-pay | Admitting: Internal Medicine

## 2020-10-18 ENCOUNTER — Telehealth: Payer: Self-pay | Admitting: Acute Care

## 2020-10-18 DIAGNOSIS — F17210 Nicotine dependence, cigarettes, uncomplicated: Secondary | ICD-10-CM

## 2020-10-18 DIAGNOSIS — Z87891 Personal history of nicotine dependence: Secondary | ICD-10-CM

## 2020-10-18 NOTE — Telephone Encounter (Signed)
I have called the patient with the results of her low dose CT. Lung RADS 2: nodules that are benign in appearance and behavior with a very low likelihood of becoming a clinically active cancer due to size or lack of growth. Recommendation per radiology is for a repeat LDCT in 12 months. There was notation of a sebaceous gland , that I have asked her to follow up with her PCP to evaluate.

## 2020-10-18 NOTE — Telephone Encounter (Signed)
Called and spoke with patient. Advised her that she will get a returned call once her CT results are interpreted.    Sarah please advise  Thank you

## 2020-10-19 ENCOUNTER — Other Ambulatory Visit: Payer: Self-pay | Admitting: Nurse Practitioner

## 2020-10-19 NOTE — Progress Notes (Signed)
I have called the patient with the results of her Low Dose Ct. Please see telephone note dated 7/18. Thanks Langley Gauss, please fax results to PCP and place order for annual screening scan for 10/2021. Thanks so much

## 2020-10-20 NOTE — Telephone Encounter (Signed)
CT result faxed to PCP. Order placed for 1 yr f/u low dose screening chest CT.

## 2020-10-25 DIAGNOSIS — H903 Sensorineural hearing loss, bilateral: Secondary | ICD-10-CM | POA: Diagnosis not present

## 2020-10-29 DIAGNOSIS — B351 Tinea unguium: Secondary | ICD-10-CM | POA: Diagnosis not present

## 2020-10-29 DIAGNOSIS — B078 Other viral warts: Secondary | ICD-10-CM | POA: Diagnosis not present

## 2020-10-29 DIAGNOSIS — L720 Epidermal cyst: Secondary | ICD-10-CM | POA: Diagnosis not present

## 2020-10-29 DIAGNOSIS — L84 Corns and callosities: Secondary | ICD-10-CM | POA: Diagnosis not present

## 2020-11-02 ENCOUNTER — Telehealth: Payer: Self-pay | Admitting: Internal Medicine

## 2020-11-03 MED ORDER — BREZTRI AEROSPHERE 160-9-4.8 MCG/ACT IN AERO: 2.0000 | INHALATION_SPRAY | Freq: Two times a day (BID) | RESPIRATORY_TRACT | 11 refills | Status: DC

## 2020-11-03 NOTE — Telephone Encounter (Signed)
Prescription printed and put in Dr. Gustavus Bryant box to be signed and faxed

## 2020-11-11 ENCOUNTER — Other Ambulatory Visit: Payer: Self-pay | Admitting: Internal Medicine

## 2020-11-11 ENCOUNTER — Encounter: Payer: Self-pay | Admitting: *Deleted

## 2020-11-11 ENCOUNTER — Telehealth: Payer: Self-pay | Admitting: Internal Medicine

## 2020-11-11 MED ORDER — BREZTRI AEROSPHERE 160-9-4.8 MCG/ACT IN AERO: 2.0000 | INHALATION_SPRAY | Freq: Two times a day (BID) | RESPIRATORY_TRACT | 0 refills | Status: DC

## 2020-11-11 MED ORDER — BREZTRI AEROSPHERE 160-9-4.8 MCG/ACT IN AERO: 2.0000 | INHALATION_SPRAY | Freq: Two times a day (BID) | RESPIRATORY_TRACT | 3 refills | Status: DC

## 2020-11-11 NOTE — Telephone Encounter (Signed)
Fine with samples, do not recall seeing forms but it's certainly possible they were signed and returned

## 2020-11-11 NOTE — Telephone Encounter (Signed)
I called the patient to let her know that we do not have any samples of Breztri at this time and I have sent a prescription to the pharmacy for the patient. She voices understanding. No other concerns. Rx for 1 sent to pharmacy on file per patient request. No other concerns.

## 2020-11-11 NOTE — Telephone Encounter (Addendum)
Pt is calling back to check on the status of her [facility] medicine from last note it does show that the form was placed in Dr. Gustavus Bryant box and the pt states that she needs it to be done soon because she cannot breathe without the medication. Pls regard; [phone].   Pt is also requesting samples for the meantime.

## 2020-11-11 NOTE — Addendum Note (Signed)
Addended by: Dessie Coma on: 11/11/2020 02:58 PM   Modules accepted: Orders

## 2020-11-11 NOTE — Telephone Encounter (Signed)
I do not see that rx was signed and MW has not been in [place] office this wk  I printed new rx for Breztri 90 day supply and faxed to [place] and me  Confirmed with pt no other forms needed   Will msg triage in [place] to leaves her 2 samples of [employer]

## 2020-11-15 DIAGNOSIS — J449 Chronic obstructive pulmonary disease, unspecified: Secondary | ICD-10-CM | POA: Diagnosis not present

## 2020-11-23 ENCOUNTER — Other Ambulatory Visit: Payer: Self-pay | Admitting: *Deleted

## 2020-11-23 MED ORDER — BREZTRI AEROSPHERE 160-9-4.8 MCG/ACT IN AERO: 2.0000 | INHALATION_SPRAY | Freq: Two times a day (BID) | RESPIRATORY_TRACT | 6 refills | Status: DC

## 2020-11-25 ENCOUNTER — Telehealth: Payer: Self-pay | Admitting: Internal Medicine

## 2020-11-25 NOTE — Telephone Encounter (Signed)
I have faxed papers to leslie as Dr. Melvyn Novas will be in [place] and leslie will be with him. Will route as FYI.

## 2020-11-26 MED ORDER — BREZTRI AEROSPHERE 160-9-4.8 MCG/ACT IN AERO: 2.0000 | INHALATION_SPRAY | Freq: Two times a day (BID) | RESPIRATORY_TRACT | 3 refills | Status: DC

## 2020-11-26 NOTE — Telephone Encounter (Signed)
I have filled out the pt assistance forms for [employer] and printed rx- Will hold in my basket until signed by MW and faxed. Forms being held in MW's red folder in [place].

## 2020-11-30 NOTE — Telephone Encounter (Signed)
Forms signed, rx signed- faxed to [place] and [facility]  Left detailed msg informing pt this was done

## 2020-12-10 ENCOUNTER — Telehealth: Payer: Self-pay

## 2020-12-10 NOTE — Telephone Encounter (Signed)
Received fax from [place] about patient assistance. Enrollment has been Denied as of 12/09/2020. Will mail letter to patient. Nothing further needed at this time.

## 2020-12-15 ENCOUNTER — Telehealth: Payer: Self-pay | Admitting: Internal Medicine

## 2020-12-15 NOTE — Telephone Encounter (Signed)
Left message for patient to call me back at [phone] to schedule Medicare Annual Wellness Visit   Last AWV  09/08/19  Please schedule at anytime with [facility] if patient calls the office back.    40 Minutes appointment   Any questions, please call me at [phone]

## 2020-12-16 ENCOUNTER — Other Ambulatory Visit: Payer: Self-pay | Admitting: Internal Medicine

## 2020-12-16 DIAGNOSIS — J449 Chronic obstructive pulmonary disease, unspecified: Secondary | ICD-10-CM | POA: Diagnosis not present

## 2020-12-16 MED ORDER — BREZTRI AEROSPHERE 160-9-4.8 MCG/ACT IN AERO: 2.0000 | INHALATION_SPRAY | Freq: Two times a day (BID) | RESPIRATORY_TRACT | 3 refills | Status: DC

## 2020-12-17 ENCOUNTER — Other Ambulatory Visit: Payer: Self-pay | Admitting: Internal Medicine

## 2020-12-25 ENCOUNTER — Other Ambulatory Visit: Payer: Self-pay | Admitting: Internal Medicine

## 2020-12-27 ENCOUNTER — Telehealth: Payer: Self-pay | Admitting: Internal Medicine

## 2020-12-27 MED ORDER — BREZTRI AEROSPHERE 160-9-4.8 MCG/ACT IN AERO: 2.0000 | INHALATION_SPRAY | Freq: Two times a day (BID) | RESPIRATORY_TRACT | 0 refills | Status: DC

## 2020-12-27 NOTE — Telephone Encounter (Signed)
I called and spoke with patient who said her dog got ahold of her inhaler and has broken it and she cannot get another refill until 01/15/21 and was asking for a sample. I placed 2 samples upfront for patient and she stated she will come and get them. Nothing further needed.

## 2020-12-30 ENCOUNTER — Telehealth: Payer: Self-pay | Admitting: Internal Medicine

## 2020-12-30 ENCOUNTER — Ambulatory Visit: Payer: Medicare HMO | Admitting: Nurse Practitioner

## 2020-12-30 NOTE — Telephone Encounter (Signed)
Unable to get in contact with the patient. LVM asking the patient to give our office a call to schedule. Office number was provided.    If patient calls back please schedule a virtual visit with Judson Roch to discuss referral.

## 2020-12-30 NOTE — Telephone Encounter (Signed)
Would like for provider to place referral for a urologist

## 2021-01-04 DIAGNOSIS — L738 Other specified follicular disorders: Secondary | ICD-10-CM | POA: Diagnosis not present

## 2021-01-04 DIAGNOSIS — L570 Actinic keratosis: Secondary | ICD-10-CM | POA: Diagnosis not present

## 2021-01-04 DIAGNOSIS — X32XXXD Exposure to sunlight, subsequent encounter: Secondary | ICD-10-CM | POA: Diagnosis not present

## 2021-01-04 DIAGNOSIS — C44311 Basal cell carcinoma of skin of nose: Secondary | ICD-10-CM | POA: Diagnosis not present

## 2021-01-06 DIAGNOSIS — H02845 Edema of left lower eyelid: Secondary | ICD-10-CM | POA: Diagnosis not present

## 2021-01-06 DIAGNOSIS — H04123 Dry eye syndrome of bilateral lacrimal glands: Secondary | ICD-10-CM | POA: Diagnosis not present

## 2021-01-06 DIAGNOSIS — H524 Presbyopia: Secondary | ICD-10-CM | POA: Diagnosis not present

## 2021-01-10 ENCOUNTER — Ambulatory Visit (INDEPENDENT_AMBULATORY_CARE_PROVIDER_SITE_OTHER): Payer: Medicare HMO | Admitting: Internal Medicine

## 2021-01-10 ENCOUNTER — Encounter: Payer: Self-pay | Admitting: Internal Medicine

## 2021-01-10 ENCOUNTER — Other Ambulatory Visit: Payer: Self-pay

## 2021-01-10 VITALS — BP 118/82 | HR 68 | Resp 18 | Ht 65.0 in | Wt 164.8 lb

## 2021-01-10 DIAGNOSIS — N3941 Urge incontinence: Secondary | ICD-10-CM

## 2021-01-10 NOTE — Progress Notes (Signed)
   Subjective:   Patient ID: Gabrielle White, female    DOB: [birth year], 76 y.o.   MRN: [id number]  HPI The patient is a 75 YO female coming in for persistent urinary incontinence.  Review of Systems  Constitutional: Negative.   HENT: Negative.    Eyes: Negative.   Respiratory:  Negative for cough, chest tightness and shortness of breath.   Cardiovascular:  Negative for chest pain, palpitations and leg swelling.  Gastrointestinal:  Negative for abdominal distention, abdominal pain, constipation, diarrhea, nausea and vomiting.  Genitourinary:  Positive for enuresis, frequency and urgency.  Musculoskeletal: Negative.   Skin: Negative.   Neurological: Negative.   Psychiatric/Behavioral: Negative.     Objective:  Physical Exam Constitutional:      Appearance: She is well-developed.  HENT:     Head: Normocephalic and atraumatic.  Cardiovascular:     Rate and Rhythm: Normal rate and regular rhythm.  Pulmonary:     Effort: Pulmonary effort is normal. No respiratory distress.     Breath sounds: Normal breath sounds. No wheezing or rales.  Abdominal:     General: Bowel sounds are normal. There is no distension.     Palpations: Abdomen is soft.     Tenderness: There is no abdominal tenderness. There is no rebound.  Musculoskeletal:     Cervical back: Normal range of motion.  Skin:    General: Skin is warm and dry.  Neurological:     Mental Status: She is alert and oriented to person, place, and time.     Coordination: Coordination normal.    Vitals:   01/10/21 1451  BP: 118/82  Pulse: 68  Resp: 18  SpO2: 97%  Weight: 164 lb 12.8 oz (74.8 kg)  Height: 5\' 5"  (1.651 m)    This visit occurred during the SARS-CoV-2 public health emergency.  Safety protocols were in place, including screening questions prior to the visit, additional usage of staff PPE, and extensive cleaning of exam room while observing appropriate contact time as indicated for disinfecting solutions.    Assessment & Plan:  Visit time 15 minutes in face to face communication with patient and coordination of care, additional 5 minutes spent in record review, coordination or care, ordering tests, communicating/referring to other healthcare professionals, documenting in medical records all on the same day of the visit for total time 20 minutes spent on the visit.

## 2021-01-10 NOTE — Patient Instructions (Addendum)
We will get you in with the urogynecologist for the urinary problems.   We have given you the flu shot ask the pharmacy about the shingles vaccine.

## 2021-01-13 NOTE — Assessment & Plan Note (Signed)
Myrbetriq was tried and did not provide much relief. She will continue until referral to urogynecology for more definitive treatment.

## 2021-01-15 DIAGNOSIS — J449 Chronic obstructive pulmonary disease, unspecified: Secondary | ICD-10-CM | POA: Diagnosis not present

## 2021-01-20 ENCOUNTER — Other Ambulatory Visit: Payer: Self-pay | Admitting: Internal Medicine

## 2021-01-21 NOTE — Telephone Encounter (Signed)
Patient called to check on status of refill. Informed her of refill policy. Pt states she is completely out of medication.

## 2021-02-25 ENCOUNTER — Ambulatory Visit: Payer: Medicare HMO | Admitting: Internal Medicine

## 2021-03-09 ENCOUNTER — Other Ambulatory Visit: Payer: Self-pay

## 2021-03-09 ENCOUNTER — Ambulatory Visit: Payer: Medicare HMO | Admitting: Obstetrics and Gynecology

## 2021-03-09 ENCOUNTER — Encounter: Payer: Self-pay | Admitting: Obstetrics and Gynecology

## 2021-03-09 VITALS — BP 100/66 | HR 105 | Ht 63.5 in | Wt 164.0 lb

## 2021-03-09 DIAGNOSIS — R159 Full incontinence of feces: Secondary | ICD-10-CM

## 2021-03-09 DIAGNOSIS — N390 Urinary tract infection, site not specified: Secondary | ICD-10-CM

## 2021-03-09 DIAGNOSIS — N393 Stress incontinence (female) (male): Secondary | ICD-10-CM

## 2021-03-09 DIAGNOSIS — N3281 Overactive bladder: Secondary | ICD-10-CM | POA: Diagnosis not present

## 2021-03-09 DIAGNOSIS — R350 Frequency of micturition: Secondary | ICD-10-CM | POA: Diagnosis not present

## 2021-03-09 LAB — POCT URINALYSIS DIPSTICK
Appearance: ABNORMAL
Bilirubin, UA: NEGATIVE
Blood, UA: NEGATIVE
Glucose, UA: NEGATIVE
Ketones, UA: NEGATIVE
Nitrite, UA: NEGATIVE
Protein, UA: NEGATIVE
Spec Grav, UA: 1.02 (ref 1.010–1.025)
Urobilinogen, UA: 0.2 E.U./dL
pH, UA: 6.5 (ref 5.0–8.0)

## 2021-03-09 MED ORDER — ESTRADIOL 0.1 MG/GM VA CREA: 0.5000 g | TOPICAL_CREAM | VAGINAL | 11 refills | Status: DC

## 2021-03-09 NOTE — Patient Instructions (Addendum)
Today we talked about ways to manage bladder urgency such as altering your diet to avoid irritative beverages and foods (bladder diet) as well as attempting to decrease stress and other exacerbating factors.    The Most Bothersome Foods* The Least Bothersome Foods*  Coffee - Regular & Decaf Tea - caffeinated Carbonated beverages - cola, non-colas, diet & caffeine-free Alcohols - Beer, Red Wine, White Wine, Champagne Fruits - Grapefruit, [place], Orange, [employer] - Cranberry, Grapefruit, Orange, Pineapple Vegetables - Tomato & Tomato Products Flavor Enhancers - Hot peppers, Spicy foods, Chili, Horseradish, Vinegar, Monosodium glutamate (MSG) Artificial Sweeteners - NutraSweet, Sweet 'N Low, Equal (sweetener), Saccharin Ethnic foods - [place], [place], [place] food Express Scripts - low-fat & whole Fruits - Bananas, Blueberries, Honeydew melon, Pears, Raisins, Watermelon Vegetables - Broccoli, Brussels Sprouts, [place], Carrots, Cauliflower, [place], Cucumber, Mushrooms, Peas, Radishes, Squash, Zucchini, White potatoes, Sweet potatoes & yams Poultry - Chicken, Eggs, [place], [employer] - Beef, Programmer, multimedia, Lamb Seafood - Shrimp, [place] fish, Salmon Grains - Oat, Rice Snacks - Pretzels, Popcorn  *Lissa Morales et al. Diet and its role in interstitial cystitis/bladder pain syndrome (IC/BPS) and comorbid conditions. Swaminathan 2012 Jan 11.    Accidental Bowel Leakage: Our goal is to achieve formed bowel movements daily or every-other-day without leakage.  You may need to try different combinations of the following options to find what works best for you.  Some management options include: Dietary changes (more leafy greens, vegetables and fruits; less processed foods) Fiber supplementation (Metamucil or something with psyllium as active ingredient) Over-the-counter imodium (tablets or liquid) to help solidify the stool and prevent leakage of stool. If you get constipated you can use Miralax  as needed to achieve bowel movements.   Start vaginal estrogen therapy nightly for two weeks then 2 times weekly at night for treatment of vaginal atrophy (dryness of the vaginal tissues).  Please let us know if the prescription is too expensive and we can look for alternative options.
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
## 2021-03-10 ENCOUNTER — Other Ambulatory Visit (HOSPITAL_COMMUNITY)
Admission: RE | Admit: 2021-03-10 | Discharge: 2021-03-10 | Disposition: A | Discharge status: Home / Self Care | Payer: Medicare HMO | Source: Ambulatory Visit | Attending: Obstetrics and Gynecology | Admitting: Obstetrics and Gynecology

## 2021-03-10 DIAGNOSIS — N390 Urinary tract infection, site not specified: Secondary | ICD-10-CM | POA: Diagnosis not present

## 2021-03-10 NOTE — Addendum Note (Signed)
Addended by: Jaquita Folds on: 03/10/2021 09:38 AM   Modules accepted: Orders

## 2021-03-12 LAB — URINE CULTURE: Culture: >=100000 — AB

## 2021-03-14 MED ORDER — SULFAMETHOXAZOLE-TRIMETHOPRIM 800-160 MG PO TABS: 1.0000 | ORAL_TABLET | Freq: Two times a day (BID) | ORAL | 0 refills | Status: AC

## 2021-03-14 NOTE — Addendum Note (Signed)
Addended by: Jaquita Folds on: 03/14/2021 08:35 AM   Modules accepted: Orders

## 2021-03-14 NOTE — Progress Notes (Signed)
LM on the VM for the patient to call back 

## 2021-03-17 NOTE — Progress Notes (Signed)
Pt was notified of the message below. Pt said she going to pick up her medications today

## 2021-03-17 NOTE — Progress Notes (Addendum)
Precertification submitted to [employer] for PTNS approved fron 04/20/2021 thru 04/19/2022 Authorization #: [id number] Ref Call #: [phone]

## 2021-03-18 ENCOUNTER — Telehealth: Payer: Self-pay | Admitting: Obstetrics and Gynecology

## 2021-03-18 ENCOUNTER — Encounter: Payer: Self-pay | Admitting: Obstetrics and Gynecology

## 2021-03-18 DIAGNOSIS — N390 Urinary tract infection, site not specified: Secondary | ICD-10-CM

## 2021-03-18 MED ORDER — ESTRADIOL 0.1 MG/GM VA CREA
0.5000 g | TOPICAL_CREAM | VAGINAL | 11 refills | Status: DC
Start: 2021-03-21 — End: 2021-07-04

## 2021-03-18 NOTE — Telephone Encounter (Signed)
Patient called and had questions about her medication. She bought the estrogen cream but it cost her $90 and she was concerned about the side effects. We discussed that the medication has minimal systemic absorption. Also looked on [facility] and it is $43 at [facility] so sent the prescription there instead. She also has not picked up the bactrim yet. Explained it is for a UTI and she needs to take that to treat the infection.   All questions answered  Jaquita Folds, MD

## 2021-03-22 DIAGNOSIS — Z85828 Personal history of other malignant neoplasm of skin: Secondary | ICD-10-CM | POA: Diagnosis not present

## 2021-03-22 DIAGNOSIS — L648 Other androgenic alopecia: Secondary | ICD-10-CM | POA: Diagnosis not present

## 2021-03-22 DIAGNOSIS — L570 Actinic keratosis: Secondary | ICD-10-CM | POA: Diagnosis not present

## 2021-03-22 DIAGNOSIS — Z08 Encounter for follow-up examination after completed treatment for malignant neoplasm: Secondary | ICD-10-CM | POA: Diagnosis not present

## 2021-03-22 DIAGNOSIS — X32XXXD Exposure to sunlight, subsequent encounter: Secondary | ICD-10-CM | POA: Diagnosis not present

## 2021-04-04 NOTE — Progress Notes (Deleted)
Brief patient profile:  75  yowf  MM/ active smoker with AB/GOLD 0 copd by pfts 03/08/15 and GOLD II criteria 11/28/2017     History of Present Illness  01/22/2015 acute extended re-establish ov/Gabrielle White re:  AB/ still smoking  Chief Complaint  Patient presents with   Pulmonary Consult    pt last seen in 2014. pt states DR. Green wanted her to follow up. pt states somethines her throat feels like it closes up and she cant breath. pt states she feels like she has to take really deep breaths. pt c/o chest tightness, and prod cough white in color.pt c/o thrush she thiks may come from the inhailers.     Last better breathing  x 4 weeks prior to OV  p using prednisone but benefit only for a few days p stopped despite maint rx with symbicort (though hfa poor)  Nl routine is symbicort 160 2 bid and maybe ventolin and occ brovana no recent duoneb need  In retrospect really hasn't been able to really stay well x 4 y Cough is harsh and congested worse day than noct  rec Start Prednisone 10mg  Take 4 for two days three for two days two for two days one for two days  Plan A =  Automatic = symbicort 160 Take 2 puffs first thing in am and then another 2 puffs about 12 hours later.  Work on Interior and spatial designer: Plan B = Backup  Only use your albuterol  Plan C = crisis  Only use nebulizer iprotropium-albuterol (duoneb) if you try the ventolin first and it fails to help > ok to use up to every 4 hours  Plan D = doctor call us if not improving  Plan E = ER > go there if all else fails Try prilosec otc 20mg   Take 30-60 min before first meal of the day and Pepcid ac (famotidine) 20 mg one @  bedtime    GERD (REFLUX) diet             01/24/2019  f/u ov/Gabrielle White re: GOLD II/ still smoking maint rx symb 160 2bid  Chief Complaint  Patient presents with   Follow-up    Breathing may be slightly better since the last visit- relates to cooler weather- using proair 2-3 x per day.   Dyspnea:  MMRC2 =  can't walk a nl pace on a flat grade s sob but does fine slow and flat but by hour 10 wearing off symb and needs albuterol typically prior to pm dose of symb Cough: spells sporacic, white daytime mostly  Sleeping: sleeping on enough pillows to get 30 degrees  SABA use: as above  rec Plan A = Automatic = Always=    Breztri Take 2 puffs first thing in am and then another 2 puffs about 12 hours later.  Work on inhaler technique:    Plan B = Backup (to supplement plan A, not to replace it) Only use your albuterol inhaler as a rescue medication   Prednisone 10 mg take  4 each am x 2 days,   2 each am x 2 days,  1 each am x 2 days and stop  Please schedule a follow up office visit in 6 months, call sooner if needed     02/21/2019  f/u ov/Gabrielle White re: GOLD II/ still smoking/ problems with access to affordable meds / does not have pred refillable as Plan C Chief Complaint  Patient presents with   Follow-up  Patient is here for follow up for COPD. Insurance denied [employer].  Dyspnea:  Walking end of road and back now x 15-20 min s stopping / slow pace = MMRC2 = can't walk a nl pace on a flat grade s sob but does fine slow and flat  Cough: some sporadic esp in am / minimal mucoid sputum   Sleeping: no resp problems SABA use: avg twice daily at most, less while on breztri rec Plan A = Automatic = Always=    Breztri = Bevespi (one or the other)  Take 2 puffs first thing in am and then another 2 puffs about 12 hours later.  Plan B = Backup (to supplement plan A, not to replace it) Only use your albuterol inhaler  Add Plan C: crisis If not improving or needing more albuterol than usual : Prednisone 10 mg take  4 each am x 2 days,   2 each am x 2 days,  1 each am x 2 days and stop  The key is to stop smoking completely before smoking completely stops you!   03/17/2019  f/u ov/Gabrielle White re:  GOLD II copd/ still smoking/ anoro and stiolto approved by [facility] but [place] her choice  will be available  Apr 04 2019  And using symb 160  2bid Chief Complaint  Patient presents with   Follow-up    Pt states she had a COPD exacerbation 5 nights ago after she believes she might have taken too much anoro. Since the exacerbation, pt stated she felt like a "zombie" but now pt said she is getting better. Pt has occ cough with white to yellow phlegm.  Dyspnea:  No longer doing any outdoor walking  Cough: none  Sleeping: no resp symptoms  SABA use: once or twice daily  02: none  rec Plan A = Automatic = Always=    Breztri Take 2 puffs first thing in am and then another 2 puffs about 12 hours later.  Work on inhaler technique:   Plan B = Backup (to supplement plan A, not to replace it) Only use your albuterol inhaler (proair)  as a rescue medication  Plan C = Crisis (instead of Plan B but only if Plan B stops working) - Prednisone 10 mg take  4 each am x 2 days,   2 each am x 2 days,  1 each am x 2 days and stop    06/09/2019  f/u ov/Gabrielle White re:  GOLD II copd/ still smoking / breztri  Chief Complaint  Patient presents with   Follow-up    Increased SOB x 3 days. She has been coughing more over the past couple of days- minimal clear sputum.   Dyspnea: very sedentary this winter / now acutely sob with min activity In setting of severe congested cough but only minimal white mucus prod Sleeping: baseline fine, only 2 hours night prior to ov due sob  SABA use: 3-4 x per week then acutely increased x  3 days prior to OV   02: none Did not try prednisone as "plan C" since getting covid shot 06/11/19 and didn't want to interfere with that rec Go directly to [place] [facility] for evaluation of new dx of respiratory failure/ copd exacerbation    Admit date: 06/09/2019 Discharge date: 06/15/2019    Principal Problem:   Acute respiratory failure with hypoxemia     Cigarette smoker   Hyperlipidemia   Hypothyroidism   Hyperglycemia   Essential hypertension   Depression  Polycythemia   COPD exacerbation  (HCC)    History of Present Illness:    Patient is 76 year old female with history of COPD/emphysema, anxiety, depression, insomnia, hypertension, hyperlipidemia, tobacco abuse who presented to the ED from her pulmonologist office (Dr. Melvyn Novas) due to progressively worsening dyspnea for 4 to 5 days with associated wheezing without responding to albuterol at home.  Patient also had increased sputum production. ED Course: Initial vital signs temperature 98.9 F, pulse 92, respirations 22, blood pressure 132/81 mmHg and O2 sat 91% on room air.  The patient was given bronchodilator and 60 mg of prednisone.  She became hypoxic and tachypneic with ambulation, so our service was asked to evaluate for further treatment. CBC shows white count 11.3, hemoglobin 15.6 g/dL and platelets 322.  PT 12.3 and INR 0.9.  Venous blood gas showed a decreased PO2 of 48.30 mmHg and bicarbonate of 29.7 mmol/L.  CMP showed a glucose of 121 mg/dL, but was otherwise normal.  Lactic acid was 1.1 mmol/L.  SARS coronavirus 2, influenza A and B by PCR was negative.  Her chest radiograph did not have any acute cardiopulmonary disease, but showed hyperinflation compatible with emphysema.     Hospital Course:    Following conditions were addressed during hospitalization as listed below,   Acute hypoxic respiratory failure secondary to COPD exacerbation.  Likely secondary to viral illness and ongoing smoking.. Patient was seen by pulmonary during hospitalization..  Patient likely has gold stage II COPD.  Pulmonary has an impression that patient has been advancing in her COPD due to continued smoking as evidenced by declining FEV1.  Patient continues to smoke until just recently.  Patient was  optimized on bronchodilators steroids but persisted to need 3 L of oxygen.  Patient will need oxygen on discharge.  Patient was emphasized the need for quitting smoking, follow-up with your pulmonary physician and will consider ambulatory pulmonary rehab  follow-up.  Continue p.o. prednisone taper on discharge.   Essential hypertension on losartan.  Continue.  Closely monitor   Polycythemia secondary to COPD.     Hyperlipidemia on atorvastatin   Hypothyroidism on levothyroxine   Depression on sertraline   GERD Continue Protonix    08/14/2019  Post hospf/u ov/Gabrielle White re: not smoking / Breztri maint  Chief Complaint  Patient presents with   Follow-up    pt states in hospital for week.pt uses rescue haler2 a week. pt states uses 3l of 02  Dyspnea:  Sitting usually fine / also supine / steps are a problem but ok slow flat = MMRC2 = can't walk a nl pace on a flat grade s sob but does fine slow and flat   Cough: still some rattling but much less am mucus production  Sleeping: bed is flat, pillows to 30 degrees SABA use: sev times  A day  02:  No longer using due to electric bill went up on it  rec Plan A = Automatic = Always=    Breztri Take 2 puffs first thing in am and then another 2 puffs about 12 hours later.  Work on Orthoptist B = Backup (to supplement plan A, not to replace it) Only use your albuterol inhaler as a rescue medication Ok to try albuterol 15 min before an activity t For cough /congestion > mucinex dm  1200 mg every 12 hours and use flutter valve as much as you can Pantoprazole (protonix) 40 mg   Take  30-60 min before first meal of the day and  Pepcid (famotidine)  20 mg one after supper until return to office - this is the best way to tell whether stomach acid is contributing to your problem.   GERD  rx Make sure you check your oxygen saturations at highest level of activity   Please schedule a follow up office visit in 4 weeks, sooner if needed  with all medications /inhalers/ solutions in hand so we can verify exactly what you are taking. This includes all medications from all doctors and over the counters   09/16/19  Cough/congestion/  2 different abx could not finish bactrim due to gi issues  then  levaquin rx   09/26/2019  f/u ov/Gabrielle White re: GOLD II still smoking / new leg swelling  Chief Complaint  Patient presents with   Follow-up    Breathing is unchanged since the last visit. She is using her albuterol inhaler 3 x per day.    Dyspnea:  MMRC2 = can't walk a nl pace on a flat grade s sob but does fine slow and flat  Cough: sometimes after supper but not during the day/ nothing purulent  Sleeping: bed is flat / 30 degrees with pillows  SABA use:  Twice daily avg / up to 3 x daily at her worst  02: wants to take back today but hasn't checked sats yet  rec No change in medications - call if your drugstore runs out of [place] and I will call the company rep to fix it. For cough/ congestion:  mucinex or mucinex dm up to 1200 mg every 12 hours as needed The key is to stop smoking completely before smoking completely stops you! 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 She is using her albuterol inhaler 3 x per wk on average.    Dyspnea:  Walks dog around neighborhood, some hills  Cough: still a little rattel, no mucus  Sleeping: on couch x sev years  SABA use: rarely 02: none  rec Plan A = Automatic = Always=   Breztri Take 2 puffs first thing in am and then another 2 puffs about 12 hours later.  Plan B = Backup (to  supplement plan A, not to replace it) Only use your albuterol inhaler as a rescue medication Plan C = Crisis  = plan AB not working > Prednisone 10 mg take  4 each am x 2 days,   2 each am x 2 days,  1 each am x 2 days and stop   08/23/2020  f/u ov/Gabrielle White re: GOLD II on breztri / smoking on prn pred as Plan C Chief Complaint  Patient presents with   Follow-up    Cough has been slightly worse "  or maybe the same"- prod with white sputum. She has occ SOB and chest tightness.  She is using her albuterol inhaler 3 x per day on average.   Dyspnea: walks dog around neighborhood/ no recent pred rx  Cough: smoker's rattle in am / nothing purulent Sleeping: on couch x sev years  SABA use: varies,never pretreats  02: has concentrator, broken, wants to qualify for POC  Covid status:  Vax x 3  Pain until R> L rib waxes and wanes x 6 m sitting, never supine Rec Continue 3lpm at bedtime thru [facility] - call if issues with equipment that they can't solve with the person's name and extention We will try to qualify you today for portable 02:    Walked RA  3 laps @ approx 259ft each @ moderate pace  stopped due to end of study sats still 94% mild sob  Make sure you check your oxygen saturation  at your highest level of activity   The key is to stop smoking completely before smoking completely stops you! Classic subdiaphragmatic pain pattern suggests ibs:  diet /citrucel     04/05/2021  f/u ov/Gabrielle White re: ***   maint on ***  No chief complaint on file.   Dyspnea:  *** Cough: *** Sleeping: *** SABA use: *** 02: *** Covid status:   ***   No obvious day to day or daytime variability or assoc excess/ purulent sputum or mucus plugs or hemoptysis or cp or chest tightness, subjective wheeze or overt sinus or hb symptoms.   *** without nocturnal  or early am exacerbation  of respiratory  c/o's or need for noct saba. Also denies any obvious fluctuation of symptoms with weather or environmental changes or other  aggravating or alleviating factors except as outlined above   No unusual exposure hx or h/o childhood pna/ asthma or knowledge of premature birth.  Current Allergies, Complete Past Medical History, Past Surgical History, Family History, and Social History were reviewed in [employer] record.  ROS  The following are not active complaints unless bolded Hoarseness, sore throat, dysphagia, dental problems, itching, sneezing,  nasal congestion or discharge of excess mucus or purulent secretions, ear ache,   fever, chills, sweats, unintended wt loss or wt gain, classically pleuritic or exertional cp,  orthopnea pnd or arm/hand swelling  or leg swelling, presyncope, palpitations, abdominal pain, anorexia, nausea, vomiting, diarrhea  or change in bowel habits or change in bladder habits, change in stools or change in urine, dysuria, hematuria,  rash, arthralgias, visual complaints, headache, numbness, weakness or ataxia or problems with walking or coordination,  change in mood or  memory.        No outpatient medications have been marked as taking for the 04/05/21 encounter (Appointment) with Tanda Rockers, MD.                     Objective:   Physical Exam   wts  04/05/2021        *** 08/23/2020      159  02/24/2020    162  11/20/2019      163 08/14/2019     168 06/09/2019       157  03/17/2019   157 02/21/2019   154  01/24/2019   156  12/11/2018       157  12/20/12 157    Vital signs reviewed  04/05/2021  - Note at rest 02 sats  ***% on ***   General appearance:    ***  Mod bar***           Assessment & Plan:

## 2021-04-05 ENCOUNTER — Ambulatory Visit: Payer: Medicare HMO | Admitting: Internal Medicine

## 2021-04-05 DIAGNOSIS — J9611 Chronic respiratory failure with hypoxia: Secondary | ICD-10-CM

## 2021-04-13 ENCOUNTER — Telehealth: Payer: Self-pay | Admitting: Internal Medicine

## 2021-04-13 MED ORDER — BENZONATATE 200 MG PO CAPS: 200.0000 mg | ORAL_CAPSULE | Freq: Four times a day (QID) | ORAL | 0 refills | Status: DC | PRN

## 2021-04-13 NOTE — Telephone Encounter (Signed)
She should already be using her action plan including prednisone   For cough mucinex dm 1200 mg every 12 hours is as strong as anything else without adding a narcotic which we can't do s ov   Tessalon 200 is the strongest med I can call in s ov   #30    1 q 6h prn

## 2021-04-13 NOTE — Telephone Encounter (Signed)
Called and spoke with pt letting her know recs per Dr. Melvyn Novas and she verbalized understanding. Rx for tessalon has been sent to pharmacy for pt. Nothing further needed.

## 2021-04-13 NOTE — Telephone Encounter (Signed)
Assessment & Plan Note by Tanda Rockers, MD at 08/23/2020 3:51 PM  Author: Tanda Rockers, MD Author Type: Physician Filed: 08/23/2020  3:52 PM  Note Status: Written Cosign: Cosign Not Required Encounter Date: 08/23/2020  Problem: COPD GOLD II  Editor: Tanda Rockers, MD (Physician)             Active smoker - PFTs  11/18/10  FEV1  2.0 (95%) ratio 73 and no change p saba and DLCO 75 corrects to 100% -PFT's  03/08/2015  FEV1 1.85 (78 % ) ratio 73  p 5 % improvement from saba with DLCO  63 % corrects to 72 % for alv volume and erv 17   - added flutter 06/07/2015  - PFT's  08/16/2016  FEV1 1.42 (61 % ) ratio 71  p 8 % improvement FEV1 (and 20% FVC) from saba p symb 160  prior to study with DLCO  61/58 % corrects to 78 % for alv volume    - Allergy profile 08/16/2016 >  Eos 0.5 /  IgE  99 RAST neg -  ADDED prednisone x 6 days cycles 11/20/2016  - 05/23/2017  After extensive coaching HFA effectiveness =    90% - PFT's  11/28/2017  FEV1 1.27 (57 % ) ratio 63  p 6 % improvement from saba p symb x 160 x 2  prior to study with DLCO  61 % corrects to 81  % for alv volume   - alpha one AT screen 06/10/2018    MM level 141   - 01/24/2019    try Breztri 2bid and pred x 6 days > much better 02/21/2019  but can't afford so changed to bevespi plus prn pred for flares  - 03/17/2019    Try  beztri   - 08/14/2019  After extensive coaching inhaler device,  effectiveness =    90% > continue breztri - 02/24/2020 resumed pred x 6 days as PLAN C     Group D in terms of symptom/risk and laba/lama/ICS  therefore appropriate rx at this point >>>  Continue breztri, approp saba, and pred x 6 days and "plan C"   Re saba: I spent extra time with pt today reviewing appropriate use of albuterol for prn use on exertion with the following points: 1) saba is for relief of sob that does not improve by walking a slower pace or resting but rather if the pt does not improve after trying this first. 2) If the pt is convinced, as many are,  that saba helps recover from activity faster then it's easy to tell if this is the case by re-challenging : ie stop, take the inhaler, then p 5 minutes try the exact same activity (intensity of workload) that just caused the symptoms and see if they are substantially diminished or not after saba 3) if there is an activity that reproducibly causes the symptoms, try the saba 15 min before the activity on alternate days    If in fact the saba really does help, then fine to continue to use it prn but advised may need to look closer at the maintenance regimen being used to achieve better control of airways disease with exertion.             Patient Instructions by Tanda Rockers, MD at 08/23/2020 3:00 PM  Author: Tanda Rockers, MD Author Type: Physician Filed: 08/23/2020  3:49 PM  Note Status: Addendum Mickle Mallory: Cosign Not Required Encounter Date: 08/23/2020  Editor:  Tanda Rockers, MD (Physician)      Prior Versions: 1. Tanda Rockers, MD (Physician) at 08/23/2020  3:44 PM - Addendum   2. Tanda Rockers, MD (Physician) at 08/23/2020  3:42 PM - Signed  Continue 3lpm at bedtime thru [facility] - call if issues with equipment that they can't solve with the person's name and extention   We will try to qualify you today for portable 02   Make sure you check your oxygen saturation  at your highest level of activity  to be sure it stays over 90% and adjust  02 flow upward to maintain this level if needed but remember to turn it back to previous settings when you stop (to conserve your supply).      The key is to stop smoking completely before smoking completely stops you!   We will refer back to our lung screening program thru Eric Form NP      Classic subdiaphragmatic pain pattern suggests ibs:  Stereotypical, migratory with a very limited distribution of pain locations, daytime, not usually exacerbated by exercise  or coughing, worse in sitting position, frequently associated with generalized abd  bloating, not as likely to be present supine due to the dome effect of the diaphragm which  is  canceled in that position. Frequently these patients have had multiple negative GI workups and CT scans.   Treatment consists of avoiding foods that cause gas (especially boiled eggs, mexcican food but especially  beans and undercooked vegetables like  spinach and some salads)  and citrucel 1 heaping tsp twice daily with a large glass of water.  Pain should improve w/in 2 weeks and if not then consider further GI work up.      Please schedule a follow up visit in 6 months but call sooner if needed       Called and spoke with pt who states she has been coughing more than usual especially overnight. Pt said that her cough even woke her up overnight 1/10. Stated that her cough has been productive as well as a dry cough. States that she has been wheezing more than usual but said that it does calm down after using her albuterol.  Pt has been having to use her albuterol at least 3-4 times a day to help with her symptoms. Pt said that she is using her Breztri inhaler as prescribed.  Asked pt if she had gotten a refill of her prednisone due to the Rx showing that it still has refills. Pt said that she had not so I did advise her to get that Rx refilled as it should help with her symptoms and she verbalized understanding.  Pt wanted to know if there were any other recommendations to help with her cough. Pt does have an upcoming appt with MW next week 1/20 but is hoping to try to get the cough under control some prior to that appt.  Dr. Melvyn Novas, please advise.

## 2021-04-20 ENCOUNTER — Encounter: Payer: Self-pay | Admitting: Obstetrics and Gynecology

## 2021-04-20 ENCOUNTER — Ambulatory Visit (INDEPENDENT_AMBULATORY_CARE_PROVIDER_SITE_OTHER): Payer: Self-pay | Admitting: Obstetrics and Gynecology

## 2021-04-20 ENCOUNTER — Other Ambulatory Visit: Payer: Self-pay

## 2021-04-20 NOTE — Progress Notes (Signed)
Pt did not want to complete appointment today until she explored costs to her.

## 2021-04-22 ENCOUNTER — Encounter: Payer: Self-pay | Admitting: Internal Medicine

## 2021-04-22 ENCOUNTER — Ambulatory Visit (INDEPENDENT_AMBULATORY_CARE_PROVIDER_SITE_OTHER): Payer: Medicare HMO | Admitting: Internal Medicine

## 2021-04-22 ENCOUNTER — Other Ambulatory Visit: Payer: Self-pay

## 2021-04-22 DIAGNOSIS — J449 Chronic obstructive pulmonary disease, unspecified: Secondary | ICD-10-CM

## 2021-04-22 DIAGNOSIS — J9611 Chronic respiratory failure with hypoxia: Secondary | ICD-10-CM

## 2021-04-22 DIAGNOSIS — F17210 Nicotine dependence, cigarettes, uncomplicated: Secondary | ICD-10-CM

## 2021-04-22 NOTE — Progress Notes (Signed)
Brief patient profile:  75  yowf  MM/ active smoker with AB/GOLD 0 copd by pfts 03/08/15 and GOLD II criteria 11/28/2017     History of Present Illness  01/22/2015 acute extended re-establish ov/Gabrielle White re:  AB/ still smoking  Chief Complaint  Patient presents with   Pulmonary Consult    pt last seen in 2014. pt states DR. Green wanted her to follow up. pt states somethines her throat feels like it closes up and she cant breath. pt states she feels like she has to take really deep breaths. pt c/o chest tightness, and prod cough white in color.pt c/o thrush she thiks may come from the inhailers.     Last better breathing  x 4 weeks prior to OV  p using prednisone but benefit only for a few days p stopped despite maint rx with symbicort (though hfa poor)  Nl routine is symbicort 160 2 bid and maybe ventolin and occ brovana no recent duoneb need  In retrospect really hasn't been able to really stay well x 4 y Cough is harsh and congested worse day than noct  rec Start Prednisone 10mg  Take 4 for two days three for two days two for two days one for two days  Plan A =  Automatic = symbicort 160 Take 2 puffs first thing in am and then another 2 puffs about 12 hours later.  Work on Interior and spatial designer: Plan B = Backup  Only use your albuterol  Plan C = crisis  Only use nebulizer iprotropium-albuterol (duoneb) if you try the ventolin first and it fails to help > ok to use up to every 4 hours  Plan D = doctor call us if not improving  Plan E = ER > go there if all else fails Try prilosec otc 20mg   Take 30-60 min before first meal of the day and Pepcid ac (famotidine) 20 mg one @  bedtime    GERD (REFLUX) diet      03/17/2019  f/u ov/Gabrielle White re:  GOLD II copd/ still smoking/ anoro and stiolto approved by [facility] but [place] her choice  will be available Apr 04 2019  And using symb 160  2bid Chief Complaint  Patient presents with   Follow-up    Pt states she had a COPD  exacerbation 5 nights ago after she believes she might have taken too much anoro. Since the exacerbation, pt stated she felt like a "zombie" but now pt said she is getting better. Pt has occ cough with white to yellow phlegm.  Dyspnea:  No longer doing any outdoor walking  Cough: none  Sleeping: no resp symptoms  SABA use: once or twice daily  02: none  rec Plan A = Automatic = Always=    Breztri Take 2 puffs first thing in am and then another 2 puffs about 12 hours later.  Work on inhaler technique:   Plan B = Backup (to supplement plan A, not to replace it) Only use your albuterol inhaler (proair)  as a rescue medication  Plan C = Crisis (instead of Plan B but only if Plan B stops working) - Prednisone 10 mg take  4 each am x 2 days,   2 each am x 2 days,  1 each am x 2 days and stop       02/24/2020  f/u ov/Gabrielle White re: GOLD II/ still smoking / breztri 2 bid/ rare saba  Chief Complaint  Patient presents with   Follow-up  Breathing has been some better since last visit- relates to cooler weather. She is using her albuterol inhaler 3 x per wk on average.    Dyspnea:  Walks dog around neighborhood, some hills  Cough: still a little rattel, no mucus  Sleeping: on couch x sev years  SABA use: rarely 02: none  rec Plan A = Automatic = Always=   Breztri Take 2 puffs first thing in am and then another 2 puffs about 12 hours later.  Plan B = Backup (to supplement plan A, not to replace it) Only use your albuterol inhaler as a rescue medication Plan C = Crisis  = plan AB not working > Prednisone 10 mg take  4 each am x 2 days,   2 each am x 2 days,  1 each am x 2 days and stop   08/23/2020  f/u ov/Gabrielle White re: GOLD II on breztri / smoking on prn pred as Plan C Chief Complaint  Patient presents with   Follow-up    Cough has been slightly worse "or maybe the same"- prod with white sputum. She has occ SOB and chest tightness.  She is using her albuterol inhaler 3 x per day on average.    Dyspnea: walks dog around neighborhood/ no recent pred rx  Cough: smoker's rattle in am / nothing purulent Sleeping: on couch x sev years  SABA use: varies,never pretreats  02: has concentrator, broken, wants to qualify for POC  Covid status:  Vax x 3  Pain until R> L rib waxes and wanes x 6 m sitting, never supine Rec Continue 3lpm at bedtime thru [facility] - call if issues with equipment that they can't solve with the person's name and extention We will try to qualify you today for portable 02:    Walked RA  3 laps @ approx 250ft each @ moderate pace  stopped due to end of study sats still 94% mild sob  Make sure you check your oxygen saturation  at your highest level of activity   The key is to stop smoking completely before smoking completely stops you! Classic subdiaphragmatic pain pattern suggests ibs:  diet /citrucel     04/22/2021  f/u ov/Gabrielle White re: GOLD 2 copd maint on breztri and pred x 6 day prn just finished 1/19  Chief Complaint  Patient presents with   Follow-up    Cough and wheezing have been worse at times. She has been coughing up some clear sputum. She is using her albuterol inhaler 2-3 x per day. She just finished a course of pred.    Dyspnea:  still walking dog, limited by back and hip pains  Cough: some in am's mucoid/ typical rattle assoc nasal congestion  Sleeping: bed is flat / on side with 2 pillows  SABA use: as above  02: 3lpm hs sleeping/ daytime prn  Covid status:   vax x 4    No obvious day to day or daytime variability or assoc excess/ purulent sputum or mucus plugs or hemoptysis or cp or chest tightness, subjective wheeze or overt sinus or hb symptoms.   Sleeping as above  without nocturnal  or early am exacerbation  of respiratory  c/o's or need for noct saba. Also denies any obvious fluctuation of symptoms with weather or environmental changes or other aggravating or alleviating factors except as outlined above   No unusual exposure hx or h/o childhood  pna/ asthma or knowledge of premature birth.  Current Allergies, Complete Past Medical History, Past  Surgical History, Family History, and Social History were reviewed in [employer] record.  ROS  The following are not active complaints unless bolded Hoarseness, sore throat, dysphagia, dental problems, itching, sneezing,  nasal congestion or discharge of excess mucus or purulent secretions, ear ache,   fever, chills, sweats, unintended wt loss or wt gain, classically pleuritic or exertional cp,  orthopnea pnd or arm/hand swelling  or leg swelling, presyncope, palpitations, abdominal pain, anorexia, nausea, vomiting, diarrhea  or change in bowel habits or change in bladder habits, change in stools or change in urine, dysuria, hematuria,  rash, arthralgias, visual complaints, headache, numbness, weakness or ataxia or problems with walking or coordination,  change in mood or  memory.        Current Meds  Medication Sig   acetaminophen (TYLENOL) 500 MG tablet Take 500 mg by mouth every 6 (six) hours as needed for mild pain or headache.   albuterol (VENTOLIN HFA) 108 (90 Base) MCG/ACT inhaler INHALE 2 PUFFS INTO THE LUNGS EVERY 6 HOURS AS NEEDED FOR WHEEZING OR SHORTNESS OF BREATH   ALPRAZolam (XANAX) 0.5 MG tablet TAKE 1 TABLET(0.5 MG) BY MOUTH AT BEDTIME AS NEEDED FOR ANXIETY   atorvastatin (LIPITOR) 40 MG tablet Take 1 tablet (40 mg total) by mouth daily.   Budeson-Glycopyrrol-Formoterol (BREZTRI AEROSPHERE) 160-9-4.8 MCG/ACT AERO Inhale 2 puffs into the lungs in the morning and at bedtime.   Cholecalciferol (VITAMIN D3) 1000 UNITS CAPS Take 1 capsule by mouth daily.   diclofenac Sodium (VOLTAREN) 1 % GEL Apply 2 g topically 4 (four) times daily as needed.   estradiol (ESTRACE) 0.1 MG/GM vaginal cream Place 0.5 g vaginally 2 (two) times a week. Place 0.5g nightly for two weeks then twice a week after   famotidine (PEPCID) 20 MG tablet Take 20 mg by mouth as needed for heartburn  or indigestion.   levothyroxine (SYNTHROID) 125 MCG tablet Take 1 tablet (125 mcg total) by mouth daily before breakfast.   losartan (COZAAR) 50 MG tablet Take 1 tablet (50 mg total) by mouth daily.   sertraline (ZOLOFT) 100 MG tablet Take 1 tablet (100 mg total) by mouth at bedtime.          Objective:   Physical Exam   wts  04/22/2021      165 08/23/2020      159  02/24/2020    162  11/20/2019      163 08/14/2019     168 06/09/2019       157  03/17/2019   157 02/21/2019   154  01/24/2019   156  12/11/2018       157  12/20/12 157    Vital signs reviewed  04/22/2021  - Note at rest 02 sats  95% on RA   General appearance:    amb wf, slt rattling cough on voluntary maneuver   HEENT : pt wearing mask not removed for exam due to covid -19 concerns.    NECK :  without JVD/Nodes/TM/ nl carotid upstrokes bilaterally   LUNGS: no acc muscle use,  Mod barrel  contour chest wall with bilateral  Distant exp rhonchi  and  without cough on insp or exp maneuvers and mod  Hyperresonant  to  percussion bilaterally     CV:  RRR  no s3 or murmur or increase in P2, and no edema   ABD:  soft and nontender with pos mid insp Hoover's  in the supine position. No bruits or organomegaly appreciated, bowel  sounds nl  MS:     ext warm without deformities, calf tenderness, cyanosis or clubbing No obvious joint restrictions   SKIN: warm and dry without lesions    NEURO:  alert, approp, nl sensorium with  no motor or cerebellar deficits apparent.         I personally reviewed images and agree with radiology impression as follows:   Chest LDSCT  10/11/20   1. Lung-RADS 2, benign appearance or behavior. Continue annual screening with low-dose chest CT without contrast in 12 months. 2. New 12 mm subcutaneous nodule in the inframammary right anterolateral chest wall. This is likely a sebaceous cyst and should be amenable to clinical inspection. 3.  Emphysema    Assessment & Plan:

## 2021-04-22 NOTE — Assessment & Plan Note (Signed)
4-5 min discussion re active cigarette smoking in addition to office E&M  Ask about tobacco use:   Ongoing  Advise quitting  I took an extended  opportunity with this patient to outline the consequences of continued cigarette use  in airway disorders based on all the data we have from the multiple national lung health studies (perfomed over decades at millions of dollars in cost)  indicating that smoking cessation, not choice of inhalers or physicians, is the most important aspect of her care and the cough that bothers her would likely improve very quickly once she quits Assess willingness:  Not committed at this point Assist in quit attempt:  Per PCP when ready Arrange follow up:   Follow up per Primary Care planned             Each maintenance medication was reviewed in detail including emphasizing most importantly the difference between maintenance and prns and under what circumstances the prns are to be triggered using an action plan format where appropriate.  Total time for H and P, chart review, counseling, reviewing hfa/02 device(s) and generating customized AVS unique to this office visit / same day charting =24 min

## 2021-04-22 NOTE — Patient Instructions (Signed)
Work on inhaler technique:  relax and gently blow all the way out then take a nice smooth full deep breath back in, triggering the inhaler at same time you start breathing in.  Hold for up to 5 seconds if you can. Blow out  breztri thru nose. Rinse and gargle with water when done.  If mouth or throat bother you at all,  try brushing teeth/gums/tongue with arm and hammer toothpaste/ make a slurry and gargle and spit out.   The key is to stop smoking completely before smoking completely stops you!    Please schedule a follow up visit in 6 months but call sooner if needed

## 2021-04-22 NOTE — Assessment & Plan Note (Signed)
Active smoker - PFTs  11/18/10  FEV1  2.0 (95%) ratio 73 and no change p saba and DLCO 75 corrects to 100% -PFT's  03/08/2015  FEV1 1.85 (78 % ) ratio 73  p 5 % improvement from saba with DLCO  63 % corrects to 72 % for alv volume and erv 17   - added flutter 06/07/2015  - PFT's  08/16/2016  FEV1 1.42 (61 % ) ratio 71  p 8 % improvement FEV1 (and 20% FVC) from saba p symb 160  prior to study with DLCO  61/58 % corrects to 78 % for alv volume    - Allergy profile 08/16/2016 >  Eos 0.5 /  IgE  99 RAST neg -  ADDED prednisone x 6 days cycles 11/20/2016  - 05/23/2017  After extensive coaching HFA effectiveness =    90% - PFT's  11/28/2017  FEV1 1.27 (57 % ) ratio 63  p 6 % improvement from saba p symb x 160 x 2  prior to study with DLCO  61 % corrects to 81  % for alv volume   - alpha one AT screen 06/10/2018    MM level 141   - 01/24/2019    try Breztri 2bid and pred x 6 days > much better 02/21/2019  but can't afford so changed to bevespi plus prn pred for flares  - 03/17/2019    Try  beztri     - 02/24/2020 resumed pred x 6 days as PLAN C  - .04/22/2021  After extensive coaching inhaler device,  effectiveness =    75 % > continue breztri   Group D in terms of symptom/risk and laba/lama/ICS  therefore appropriate rx at this point >>>  breztri plus prn saba and Prednisone x 6 d prn flares  F/u q 6 m, call sooner prn

## 2021-04-22 NOTE — Assessment & Plan Note (Signed)
Placed on 02 at d/c from admit 06/15/19  -  08/14/2019 desats on RA walking corrected with 3lpm POC > ordered but not using as of 02/24/2020  -    08/23/2020   Walked RA  3 laps @ approx 279ft each @ moderate pace  stopped due to end of study sats still 94% mild sob        Continue 3lpm hs and prn daytime with target sats> 90%

## 2021-04-27 ENCOUNTER — Other Ambulatory Visit: Payer: Self-pay

## 2021-04-27 ENCOUNTER — Encounter: Payer: Self-pay | Admitting: Obstetrics and Gynecology

## 2021-04-27 ENCOUNTER — Ambulatory Visit: Payer: Medicare HMO | Admitting: Obstetrics and Gynecology

## 2021-04-27 VITALS — BP 113/80 | HR 85

## 2021-04-27 DIAGNOSIS — N3281 Overactive bladder: Secondary | ICD-10-CM | POA: Diagnosis not present

## 2021-04-27 NOTE — Progress Notes (Signed)
[facility]  PTNS VISIT  CC:  Overactive bladder  76 y.o. with refractory overactive bladder who presents for percutaneous tibial nerve stimulation. The patient presents for PTNS session # 1.  She brought her bladder diary today- scanned into Epic. She states she is most bothered by her leakage during the night.   Procedure: The patient spontaneously voided prior to beginning the procedure. The patient was placed in the sitting position and the left lower extremity was prepped in the usual fashion. The PTNS needle was then inserted at a 60 degree angle, 5 cm cephalad and 2 cm posterior to the medial malleolus. The PTNS unit was then programmed and an optimal response was noted at a setting of  6 milliamps. The PTNS stimulation was then performed at this setting for 30 minutes without incident and the patient tolerated the procedure well. The needle was removed and hemostasis was noted.   The pt will return in 1 weeks for PTNS session # 2. All questions were answered.   Jaquita Folds, MD

## 2021-04-28 ENCOUNTER — Other Ambulatory Visit: Payer: Self-pay | Admitting: Neurosurgery

## 2021-04-28 DIAGNOSIS — Q046 Congenital cerebral cysts: Secondary | ICD-10-CM

## 2021-05-04 ENCOUNTER — Ambulatory Visit: Payer: Medicare HMO | Admitting: Obstetrics and Gynecology

## 2021-05-04 ENCOUNTER — Encounter: Payer: Self-pay | Admitting: Obstetrics and Gynecology

## 2021-05-04 ENCOUNTER — Other Ambulatory Visit: Payer: Self-pay

## 2021-05-04 VITALS — BP 121/75 | HR 80

## 2021-05-04 DIAGNOSIS — N3281 Overactive bladder: Secondary | ICD-10-CM | POA: Diagnosis not present

## 2021-05-04 NOTE — Progress Notes (Signed)
[facility]  PTNS VISIT  CC:  Overactive bladder  76 y.o. with refractory overactive bladder who presents for percutaneous tibial nerve stimulation. The patient presents for PTNS session # 2.  She brought her bladder diary today- scanned into Epic. She states she is most bothered by her leakage during the night.   Procedure: The patient spontaneously voided prior to beginning the procedure. The patient was placed in the sitting position and the left lower extremity was prepped in the usual fashion. The PTNS needle was then inserted at a 60 degree angle, 5 cm cephalad and 2 cm posterior to the medial malleolus. The PTNS unit was then programmed and an optimal response was noted at a setting of 10 milliamps. The PTNS stimulation was then performed at this setting for 30 minutes without incident and the patient tolerated the procedure well. The needle was removed and hemostasis was noted.   The pt will return in 1 weeks for PTNS session # 3. All questions were answered.   Jaquita Folds, MD

## 2021-05-11 ENCOUNTER — Other Ambulatory Visit: Payer: Self-pay

## 2021-05-11 ENCOUNTER — Ambulatory Visit: Payer: Medicare HMO | Admitting: Obstetrics and Gynecology

## 2021-05-11 ENCOUNTER — Encounter: Payer: Self-pay | Admitting: Obstetrics and Gynecology

## 2021-05-11 VITALS — BP 116/75 | HR 90

## 2021-05-11 DIAGNOSIS — N3281 Overactive bladder: Secondary | ICD-10-CM

## 2021-05-11 NOTE — Progress Notes (Signed)
[facility]  PTNS VISIT  CC:  Overactive bladder  76 y.o. with refractory overactive bladder who presents for percutaneous tibial nerve stimulation. The patient presents for PTNS session # 3.  Has not noticed any improvement in her symptoms yet. Still has occasional large volume leakage.    Procedure: The patient spontaneously voided prior to beginning the procedure. The patient was placed in the sitting position and the left lower extremity was prepped in the usual fashion. The PTNS needle was then inserted at a 60 degree angle, 5 cm cephalad and 2 cm posterior to the medial malleolus. The PTNS unit was then programmed and an optimal response was noted at a setting of 9 milliamps. The PTNS stimulation was then performed at this setting for 30 minutes without incident and the patient tolerated the procedure well. The needle was removed and hemostasis was noted.   The pt will return in 1 weeks for PTNS session # 4. All questions were answered.   Jaquita Folds, MD

## 2021-05-18 ENCOUNTER — Encounter: Payer: Self-pay | Admitting: Obstetrics and Gynecology

## 2021-05-18 ENCOUNTER — Ambulatory Visit: Payer: Medicare HMO | Admitting: Obstetrics and Gynecology

## 2021-05-18 ENCOUNTER — Other Ambulatory Visit: Payer: Self-pay

## 2021-05-18 VITALS — BP 130/89 | HR 81

## 2021-05-18 DIAGNOSIS — N3281 Overactive bladder: Secondary | ICD-10-CM

## 2021-05-18 NOTE — Progress Notes (Signed)
[facility]  PTNS VISIT  CC:  Overactive bladder  76 y.o. with refractory overactive bladder who presents for percutaneous tibial nerve stimulation. The patient presents for PTNS session # 4.  Has noticed that she has less urgency and frequency overall.   Procedure: The patient spontaneously voided prior to beginning the procedure. The patient was placed in the sitting position and the left lower extremity was prepped in the usual fashion. The PTNS needle was then inserted at a 60 degree angle, 5 cm cephalad and 2 cm posterior to the medial malleolus. The PTNS unit was then programmed and an optimal response was noted at a setting of 6 milliamps. The PTNS stimulation was then performed at this setting for 30 minutes without incident and the patient tolerated the procedure well. The needle was removed and hemostasis was noted.   The pt will return in 1 weeks for PTNS session # 5. All questions were answered.   Jaquita Folds, MD

## 2021-05-25 ENCOUNTER — Ambulatory Visit
Admission: RE | Admit: 2021-05-25 | Discharge: 2021-05-25 | Disposition: A | Discharge status: Home / Self Care | Payer: Medicare HMO | Source: Ambulatory Visit | Attending: Neurosurgery | Admitting: Neurosurgery

## 2021-05-25 ENCOUNTER — Ambulatory Visit: Payer: Medicare HMO | Admitting: Obstetrics and Gynecology

## 2021-05-25 DIAGNOSIS — Q046 Congenital cerebral cysts: Secondary | ICD-10-CM

## 2021-05-27 ENCOUNTER — Encounter: Payer: Self-pay | Admitting: Obstetrics and Gynecology

## 2021-05-27 ENCOUNTER — Ambulatory Visit: Payer: Medicare HMO | Admitting: Obstetrics and Gynecology

## 2021-05-27 ENCOUNTER — Other Ambulatory Visit: Payer: Self-pay

## 2021-05-27 VITALS — BP 101/68 | HR 99

## 2021-05-27 DIAGNOSIS — N3281 Overactive bladder: Secondary | ICD-10-CM

## 2021-05-27 NOTE — Progress Notes (Signed)
[facility]  PTNS VISIT  CC:  Overactive bladder  76 y.o. with refractory overactive bladder who presents for percutaneous tibial nerve stimulation. The patient presents for PTNS session # 5.  Has noticed some improvement in urgency/ frequency but she is still having some large accidents.   Procedure: The patient spontaneously voided prior to beginning the procedure. The patient was placed in the sitting position and the left lower extremity was prepped in the usual fashion. The PTNS needle was then inserted at a 60 degree angle, 5 cm cephalad and 2 cm posterior to the medial malleolus. The PTNS unit was then programmed and an optimal response was noted at a setting of 9 milliamps. The PTNS stimulation was then performed at this setting for 30 minutes without incident and the patient tolerated the procedure well. The needle was removed and hemostasis was noted.   The pt will return in 1 weeks for PTNS session # 6. All questions were answered.   Jaquita Folds, MD

## 2021-06-01 ENCOUNTER — Ambulatory Visit: Payer: Medicare HMO | Admitting: Obstetrics and Gynecology

## 2021-06-01 NOTE — Progress Notes (Signed)
[facility] ? ?PTNS VISIT ? ?CC:  Overactive bladder ? ?76 y.o. with refractory overactive bladder who presents for percutaneous tibial nerve stimulation. ?The patient presents for PTNS session # 6. ? ? ?Procedure: ?The patient spontaneously voided prior to beginning the procedure. The patient was placed in the sitting position and the left lower extremity was prepped in the usual fashion. The PTNS needle was then inserted at a 60 degree angle, 5 cm cephalad and 2 cm posterior to the medial malleolus. The PTNS unit was then programmed and an optimal response was noted at a setting of 9 milliamps. The PTNS stimulation was then performed at this setting for 30 minutes without incident and the patient tolerated the procedure well. The needle was removed and hemostasis was noted.  ? ?The pt will return in 1 weeks for PTNS session # 7. ?She was provided with a bladder diary that she should fill out for 3 days prior to next visit to evaluate progress.  ? ? ?Jaquita Folds, MD ? ?

## 2021-06-02 ENCOUNTER — Encounter: Payer: Self-pay | Admitting: Obstetrics and Gynecology

## 2021-06-02 ENCOUNTER — Ambulatory Visit: Payer: Medicare HMO | Admitting: Obstetrics and Gynecology

## 2021-06-02 ENCOUNTER — Other Ambulatory Visit: Payer: Self-pay

## 2021-06-02 VITALS — BP 142/75 | HR 83

## 2021-06-02 DIAGNOSIS — N3281 Overactive bladder: Secondary | ICD-10-CM | POA: Diagnosis not present

## 2021-06-08 ENCOUNTER — Other Ambulatory Visit: Payer: Self-pay

## 2021-06-08 ENCOUNTER — Encounter: Payer: Self-pay | Admitting: Obstetrics and Gynecology

## 2021-06-08 ENCOUNTER — Ambulatory Visit: Payer: Medicare HMO | Admitting: Obstetrics and Gynecology

## 2021-06-08 VITALS — BP 118/79 | HR 87

## 2021-06-08 DIAGNOSIS — N3281 Overactive bladder: Secondary | ICD-10-CM

## 2021-06-08 NOTE — Progress Notes (Signed)
[facility] ? ?PTNS VISIT ? ?CC:  Overactive bladder ? ?75 y.o. with refractory overactive bladder who presents for percutaneous tibial nerve stimulation. ?The patient presents for PTNS session # 7. ? ?She brought her 3 day bladder diary and her urgency leakage episodes have decreased from 4-9 episodes/ day to 3-4 episodes/ day. She feels she has more time to get to the bathroom. Still has leakage with coughing.  ? ? ?Procedure: ?The patient spontaneously voided prior to beginning the procedure. The patient was placed in the sitting position and the left lower extremity was prepped in the usual fashion. The PTNS needle was then inserted at a 60 degree angle, 5 cm cephalad and 2 cm posterior to the medial malleolus. The PTNS unit was then programmed and an optimal response was noted at a setting of 5 milliamps. The PTNS stimulation was then performed at this setting for 30 minutes without incident and the patient tolerated the procedure well. The needle was removed and hemostasis was noted.  ? ?The pt will return in 1 weeks for PTNS session # 8. ?She will make a pessary fitting appointment for her stress incontinence.  ? ?Jaquita Folds, MD ? ?

## 2021-06-15 ENCOUNTER — Ambulatory Visit: Payer: Medicare HMO | Admitting: Obstetrics and Gynecology

## 2021-06-15 ENCOUNTER — Other Ambulatory Visit: Payer: Self-pay

## 2021-06-15 ENCOUNTER — Encounter: Payer: Self-pay | Admitting: Obstetrics and Gynecology

## 2021-06-15 VITALS — BP 134/87 | HR 75

## 2021-06-15 DIAGNOSIS — N3281 Overactive bladder: Secondary | ICD-10-CM | POA: Diagnosis not present

## 2021-06-15 NOTE — Progress Notes (Signed)
[facility] ? ?PTNS VISIT ? ?CC:  Overactive bladder ? ?76 y.o. with refractory overactive bladder who presents for percutaneous tibial nerve stimulation. ?The patient presents for PTNS session # 8. ? ?She does not want to do the pessary fitting today. ? ?Procedure: ?The patient spontaneously voided prior to beginning the procedure. The patient was placed in the sitting position and the left lower extremity was prepped in the usual fashion. The PTNS needle was then inserted at a 60 degree angle, 5 cm cephalad and 2 cm posterior to the medial malleolus. The PTNS unit was then programmed and an optimal response was noted at a setting of 9 milliamps. The PTNS stimulation was then performed at this setting for 30 minutes without incident and the patient tolerated the procedure well. The needle was removed and hemostasis was noted.  ? ?The pt will return in 1 weeks for PTNS session # 9. ? ? ?Jaquita Folds, MD ? ?

## 2021-06-20 ENCOUNTER — Other Ambulatory Visit: Payer: Self-pay | Admitting: Internal Medicine

## 2021-06-22 ENCOUNTER — Encounter: Payer: Self-pay | Admitting: Obstetrics and Gynecology

## 2021-06-22 ENCOUNTER — Ambulatory Visit (INDEPENDENT_AMBULATORY_CARE_PROVIDER_SITE_OTHER): Payer: Medicare HMO | Admitting: Obstetrics and Gynecology

## 2021-06-22 ENCOUNTER — Other Ambulatory Visit: Payer: Self-pay

## 2021-06-22 VITALS — BP 152/62 | HR 80

## 2021-06-22 DIAGNOSIS — N3281 Overactive bladder: Secondary | ICD-10-CM | POA: Diagnosis not present

## 2021-06-22 NOTE — Progress Notes (Signed)
[facility] ? ?PTNS VISIT ? ?CC:  Overactive bladder ? ?76 y.o. with refractory overactive bladder who presents for percutaneous tibial nerve stimulation. ?The patient presents for PTNS session # 9. ? ? ?Procedure: ?The patient spontaneously voided prior to beginning the procedure. The patient was placed in the sitting position and the right lower extremity was prepped in the usual fashion. The PTNS needle was then inserted at a 60 degree angle, 5 cm cephalad and 2 cm posterior to the medial malleolus. The PTNS unit was then programmed and an optimal response was noted at a setting of 10 milliamps. The PTNS stimulation was then performed at this setting for 30 minutes without incident and the patient tolerated the procedure well. The needle was removed and hemostasis was noted.  ? ?The pt will return in 1 weeks for PTNS session #10. ? ? ?Jaquita Folds, MD ? ?

## 2021-06-29 ENCOUNTER — Other Ambulatory Visit: Payer: Self-pay | Admitting: Internal Medicine

## 2021-06-29 ENCOUNTER — Ambulatory Visit: Payer: Medicare HMO | Admitting: Obstetrics and Gynecology

## 2021-06-30 ENCOUNTER — Other Ambulatory Visit: Payer: Self-pay | Admitting: Internal Medicine

## 2021-07-04 ENCOUNTER — Ambulatory Visit (INDEPENDENT_AMBULATORY_CARE_PROVIDER_SITE_OTHER): Payer: Medicare HMO | Admitting: Nurse Practitioner

## 2021-07-04 ENCOUNTER — Encounter (HOSPITAL_BASED_OUTPATIENT_CLINIC_OR_DEPARTMENT_OTHER): Payer: Self-pay | Admitting: Nurse Practitioner

## 2021-07-04 VITALS — BP 128/88 | HR 84 | Temp 98.6°F | Ht 65.0 in | Wt 162.9 lb

## 2021-07-04 DIAGNOSIS — F330 Major depressive disorder, recurrent, mild: Secondary | ICD-10-CM | POA: Diagnosis not present

## 2021-07-04 DIAGNOSIS — D751 Secondary polycythemia: Secondary | ICD-10-CM

## 2021-07-04 DIAGNOSIS — J449 Chronic obstructive pulmonary disease, unspecified: Secondary | ICD-10-CM | POA: Diagnosis not present

## 2021-07-04 DIAGNOSIS — R739 Hyperglycemia, unspecified: Secondary | ICD-10-CM

## 2021-07-04 DIAGNOSIS — I700 Atherosclerosis of aorta: Secondary | ICD-10-CM | POA: Diagnosis not present

## 2021-07-04 DIAGNOSIS — I10 Essential (primary) hypertension: Secondary | ICD-10-CM

## 2021-07-04 DIAGNOSIS — E7849 Other hyperlipidemia: Secondary | ICD-10-CM

## 2021-07-04 DIAGNOSIS — Z639 Problem related to primary support group, unspecified: Secondary | ICD-10-CM

## 2021-07-04 DIAGNOSIS — E039 Hypothyroidism, unspecified: Secondary | ICD-10-CM

## 2021-07-04 DIAGNOSIS — F5101 Primary insomnia: Secondary | ICD-10-CM

## 2021-07-04 DIAGNOSIS — Z23 Encounter for immunization: Secondary | ICD-10-CM

## 2021-07-04 MED ORDER — ZOSTER VAC RECOMB ADJUVANTED 50 MCG/0.5ML IM SUSR: 0.5000 mL | Freq: Once | INTRAMUSCULAR | 1 refills | Status: AC

## 2021-07-04 MED ORDER — HYDROXYZINE PAMOATE 25 MG PO CAPS: 25.0000 mg | ORAL_CAPSULE | Freq: Every evening | ORAL | 11 refills | Status: DC | PRN

## 2021-07-04 MED ORDER — GUAIFENESIN ER 600 MG PO TB12: 1200.0000 mg | ORAL_TABLET | Freq: Two times a day (BID) | ORAL | 11 refills | Status: DC

## 2021-07-04 MED ORDER — AMBULATORY NON FORMULARY MEDICATION: 0 refills | Status: AC

## 2021-07-04 NOTE — Assessment & Plan Note (Signed)
Chronic. Taking alprazolam for insomnia r/t increased breathlessness with COPD. Not working as well as it has in the past. Do not recommend increase in dosing at this time due to risks of falls and memory decline. Discussed risks with patient today. Will add hydroxyzine for trial to see if this is helpful for sleep- lower risk of falls. She has been taking alprazolam for several years, therefore I do feel that sudden cessation is not advised, but a slow taper would be beneficial. Will monitor.  ?

## 2021-07-04 NOTE — Assessment & Plan Note (Signed)
Chronic. Controlled today. Continue current regimen. Labs today.  ?

## 2021-07-04 NOTE — Patient Instructions (Addendum)
Thank you for choosing [facility] for your Primary Care needs. I am excited for the opportunity to partner with you to meet your health care goals. It was a pleasure meeting you today! ? ?Recommendations from today's visit: ?We will get some labs today and make sure everything looks ok.  ?I will let you know how things look and if we need to make any changes I will let you know.  ?I have sent a medication called Hydroxyzine to help with sleep. You can take this about 30 minutes before bedtime. If it isn't helping, you can take a second dose. It is safe to take this every night or only as needed.  ? ?Information on diet, exercise, and health maintenance recommendations are listed below. This is information to help you be sure you are on track for optimal health and monitoring.  ? ?Please look over this and let us know if you have any questions or if you have completed any of the health maintenance outside of [facility] so that we can be sure your records are up to date.  ?___________________________________________________________ ?About Me: ?I am an Adult-Geriatric Nurse Practitioner with a background in caring for patients for more than 20 years with a strong intensive care background. I provide primary care and sports medicine services to patients age 86 and older within this office. My education had a strong focus on caring for the older adult population, which I am passionate about. I am also the director of the APP Fellowship with [facility].  ? ?My desire is to provide you with the best service through preventive medicine and supportive care. I consider you a part of the medical team and value your input. I work diligently to ensure that you are heard and your needs are met in a safe and effective manner. I want you to feel comfortable with me as your provider and want you to know that your health concerns are important to me. ? ?For your information, our office hours  are: ?Monday, Tuesday, and Thursday 8:00 AM - 5:00 PM ?Wednesday and Friday 8:00 AM - 12:00 PM.  ? ?In my time away from the office I am teaching new APP's within the system and am unavailable, but my partner, Dr. Burnard Bunting is in the office for emergent needs.  ? ?If you have questions or concerns, please call our office at [phone] or send [place] a MyChart message and we will respond as quickly as possible.  ?____________________________________________________________ ?MyChart:  ?For all urgent or time sensitive needs we ask that you please call the office to avoid delays. Our number is [phone]. ?MyChart is not constantly monitored and due to the large volume of messages a day, replies may take up to 72 business hours. ? ?MyChart Policy: ?MyChart allows for you to see your visit notes, after visit summary, provider recommendations, lab and tests results, make an appointment, request refills, and contact your provider or the office for non-urgent questions or concerns. Providers are seeing patients during normal business hours and do not have built in time to review MyChart messages.  ?We ask that you allow a minimum of 3 business days for responses to [employer]. For this reason, please do not send urgent requests through [place]. Please call the office at [phone]. ?New and ongoing conditions may require a visit. We have virtual and in person visit available for your convenience.  ?Complex MyChart concerns may require a visit. Your provider may request you  schedule a virtual or in person visit to ensure we are providing the best care possible. ?MyChart messages sent after 11:00 AM on Friday will not be received by the provider until Monday morning.  ?  ?Lab and Test Results: ?You will receive your lab and test results on MyChart as soon as they are completed and results have been sent by the lab or testing facility. Due to this service, you will receive your results BEFORE your provider.  ?I review  lab and tests results each morning prior to seeing patients. Some results require collaboration with other providers to ensure you are receiving the most appropriate care. For this reason, we ask that you please allow a minimum of 3-5 business days from the time the ALL results have been received for your provider to receive and review lab and test results and contact you about these.  ?Most lab and test result comments from the provider will be sent through [place]. Your provider may recommend changes to the plan of care, follow-up visits, repeat testing, ask questions, or request an office visit to discuss these results. You may reply directly to this message or call the office at [phone] to provide information for the provider or set up an appointment. ?In some instances, you will be called with test results and recommendations. Please let us know if this is preferred and we will make note of this in your chart to provide this for you.    ?If you have not heard a response to your lab or test results in 5 business days from all results returning to [place], please call the office to let us know. We ask that you please avoid calling prior to this time unless there is an emergent concern. Due to high call volumes, this can delay the resulting process. ? ?After Hours: ?For all non-emergency after hours needs, please call the office at [phone] and select the option to reach the on-call provider service. On-call services are shared between multiple [facility] offices and therefore it will not be possible to speak directly with your provider. On-call providers may provide medical advice and recommendations, but are unable to provide refills for maintenance medications.  ?For all emergency or urgent medical needs after normal business hours, we recommend that you seek care at the closest Urgent Care or Emergency Department to ensure appropriate treatment in a timely manner.  ?[facility]  has a 24 hour emergency room located on the ground floor for your convenience.  ? ?Urgent Concerns During the Business Day ?Providers are seeing patients from 8AM to [place] with a busy schedule and are most often not able to respond to non-urgent calls until the end of the day or the next business day. ?If you should have URGENT concerns during the day, please call and speak to the nurse or schedule a same day appointment so that we can address your concern without delay.  ? ?Thank you, again, for choosing me as your health care partner. I appreciate your trust and look forward to learning more about you.  ? ?Worthy Keeler, DNP, AGNP-c ?___________________________________________________________ ? ?Health Maintenance Recommendations ?Screening Testing ?Mammogram ?Every 1 -2 years based on history and risk factors ?Starting at age 66 ?Pap Smear ?Ages 21-39 every 3 years ?Ages 33-65 every 5 years with HPV testing ?More frequent testing may be required based on results and history ?Colon Cancer Screening ?Every 1-10 years based on test performed, risk factors, and history ?Starting at age 9 ?Bone  Density Screening ?Every 2-10 years based on history ?Starting at age 67 for women ?Recommendations for men differ based on medication usage, history, and risk factors ?AAA Screening ?One time ultrasound ?Men 7-33 years old who have every smoked ?Lung Cancer Screening ?Low Dose Lung CT every 12 months ?Age 33-80 years with a 30 pack-year smoking history who still smoke or who have quit within the last 15 years ? ?Screening Labs ?Routine  Labs: Complete Blood Count (CBC), Complete Metabolic Panel (CMP), Cholesterol (Lipid Panel) ?Every 6-12 months based on history and medications ?May be recommended more frequently based on current conditions or previous results ?Hemoglobin A1c Lab ?Every 3-12 months based on history and previous results ?Starting at age 62 or earlier with diagnosis of diabetes, high cholesterol, BMI >26,  and/or risk factors ?Frequent monitoring for patients with diabetes to ensure blood sugar control ?Thyroid Panel (TSH w/ T3 & T4) ?Every 6 months based on history, symptoms, and risk factors ?May be repeated

## 2021-07-04 NOTE — Assessment & Plan Note (Signed)
Chronic. Managed with pulmonology. 3L O2 at home with no evidence of hypoxia present today.  ?Will send Rx for pulse oximeter for her to measure her O2 status at home.  ?Recommend smoking cessation.  ?Will obtain labs today.  ?F/U if signs of infection present or status worsens.  ?

## 2021-07-04 NOTE — Assessment & Plan Note (Signed)
Chronic. Will obtain labs today for further evaluation. Recommend continue current dose of levothyroxine.  ?

## 2021-07-04 NOTE — Assessment & Plan Note (Signed)
Historical. No medication. No dietary restrictions.  ?Will monitor labs today. Aggressive management recommended given patients CV and smoking history to help protect against further CV damage.  ?Will make changes to plan of care based on labs.  ?

## 2021-07-04 NOTE — Assessment & Plan Note (Signed)
Chronic. On statin therapy. Smoking status increases CV risks, recommend trying to quit. If desired, will be happy to provide resources and medication management to help. UTD on vaccines.  ?

## 2021-07-04 NOTE — Assessment & Plan Note (Signed)
Labs today

## 2021-07-04 NOTE — Assessment & Plan Note (Signed)
Chronic. On statin therapy. Will obtain labs today- patient not fasting, therefore LDL will be monitored. Continue with current medication regimen.  ?

## 2021-07-04 NOTE — Assessment & Plan Note (Signed)
Recently started on sertraline, but decided not to start this medication. Will send referral for counseling services to see if this is helpful in her management. She will monitor for new or worsening mood and notify immediately.  ?
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
## 2021-07-05 LAB — LP+LDL DIRECT
Cholesterol, Total: 194 mg/dL (ref 100–199)
HDL: 69 mg/dL (ref >39)
LDL Chol Calc (NIH): 95 mg/dL (ref 0–99)
LDL Direct: 107 mg/dL — ABNORMAL HIGH (ref 0–99)
Triglycerides: 177 mg/dL — ABNORMAL HIGH (ref 0–149)
VLDL Cholesterol Cal: 30 mg/dL (ref 5–40)

## 2021-07-05 LAB — CBC WITH DIFFERENTIAL/PLATELET
Basophils Absolute: 0.1 10*3/uL (ref 0.0–0.2)
Basos: 1 %
EOS (ABSOLUTE): 0.5 10*3/uL — ABNORMAL HIGH (ref 0.0–0.4)
Eos: 4 %
Hematocrit: 44.6 % (ref 34.0–46.6)
Hemoglobin: 15 g/dL (ref 11.1–15.9)
Immature Grans (Abs): 0 10*3/uL (ref 0.0–0.1)
Immature Granulocytes: 0 %
Lymphocytes Absolute: 2.7 10*3/uL (ref 0.7–3.1)
Lymphs: 22 %
MCH: 30.7 pg (ref 26.6–33.0)
MCHC: 33.6 g/dL (ref 31.5–35.7)
MCV: 91 fL (ref 79–97)
Monocytes Absolute: 0.7 10*3/uL (ref 0.1–0.9)
Monocytes: 6 %
Neutrophils Absolute: 8.3 10*3/uL — ABNORMAL HIGH (ref 1.4–7.0)
Neutrophils: 67 %
Platelets: 279 10*3/uL (ref 150–450)
RBC: 4.89 x10E6/uL (ref 3.77–5.28)
RDW: 12.7 % (ref 11.7–15.4)
WBC: 12.2 10*3/uL — ABNORMAL HIGH (ref 3.4–10.8)

## 2021-07-05 LAB — COMPREHENSIVE METABOLIC PANEL
ALT: 15 IU/L (ref 0–32)
AST: 16 IU/L (ref 0–40)
Albumin/Globulin Ratio: 1.9 (ref 1.2–2.2)
Albumin: 4.3 g/dL (ref 3.7–4.7)
Alkaline Phosphatase: 123 IU/L — ABNORMAL HIGH (ref 44–121)
BUN/Creatinine Ratio: 15 (ref 12–28)
BUN: 12 mg/dL (ref 8–27)
Bilirubin Total: 0.3 mg/dL (ref 0.0–1.2)
CO2: 26 mmol/L (ref 20–29)
Calcium: 9.6 mg/dL (ref 8.7–10.3)
Chloride: 101 mmol/L (ref 96–106)
Creatinine, Ser: 0.82 mg/dL (ref 0.57–1.00)
Globulin, Total: 2.3 g/dL (ref 1.5–4.5)
Glucose: 85 mg/dL (ref 70–99)
Potassium: 4.4 mmol/L (ref 3.5–5.2)
Sodium: 141 mmol/L (ref 134–144)
Total Protein: 6.6 g/dL (ref 6.0–8.5)
eGFR: 74 mL/min/{1.73_m2} (ref >59)

## 2021-07-05 LAB — TSH: TSH: 2.42 u[IU]/mL (ref 0.450–4.500)

## 2021-07-05 LAB — T3: T3, Total: 92 ng/dL (ref 71–180)

## 2021-07-05 LAB — HEMOGLOBIN A1C
Est. average glucose Bld gHb Est-mCnc: 117 mg/dL
Hgb A1c MFr Bld: 5.7 % — ABNORMAL HIGH (ref 4.8–5.6)

## 2021-07-05 LAB — T4: T4, Total: 9.8 ug/dL (ref 4.5–12.0)

## 2021-07-06 ENCOUNTER — Ambulatory Visit: Payer: Medicare HMO | Admitting: Obstetrics and Gynecology

## 2021-07-06 ENCOUNTER — Encounter: Payer: Self-pay | Admitting: Obstetrics and Gynecology

## 2021-07-06 VITALS — BP 120/64 | HR 84

## 2021-07-06 DIAGNOSIS — N3281 Overactive bladder: Secondary | ICD-10-CM

## 2021-07-06 NOTE — Progress Notes (Signed)
[facility] ? ?PTNS VISIT ? ?CC:  Overactive bladder ? ?76 y.o. with refractory overactive bladder who presents for percutaneous tibial nerve stimulation. ?The patient presents for PTNS session # 10. ? ? ?Procedure: ?The patient spontaneously voided prior to beginning the procedure. The patient was placed in the sitting position and the right lower extremity was prepped in the usual fashion. The PTNS needle was then inserted at a 60 degree angle, 5 cm cephalad and 2 cm posterior to the medial malleolus. The PTNS unit was then programmed and an optimal response was noted at a setting of 6 milliamps. The PTNS stimulation was then performed at this setting for 30 minutes without incident and the patient tolerated the procedure well. The needle was removed and hemostasis was noted.  ? ?The pt will return in 1 week for PTNS session #11. ? ? ?Jaquita Folds, MD ? ?

## 2021-07-07 ENCOUNTER — Telehealth: Payer: Self-pay | Admitting: Internal Medicine

## 2021-07-08 MED ORDER — ALBUTEROL SULFATE HFA 108 (90 BASE) MCG/ACT IN AERS: 2.0000 | INHALATION_SPRAY | Freq: Four times a day (QID) | RESPIRATORY_TRACT | 5 refills | Status: AC | PRN

## 2021-07-08 NOTE — Telephone Encounter (Signed)
Rx for pt's albuterol inhaler has been sent to preferred pharmacy. Called and spoke with pt letting her know this had been done and she verbalized understanding. Nothing further needed. ?

## 2021-07-13 ENCOUNTER — Ambulatory Visit: Payer: Medicare HMO | Admitting: Obstetrics and Gynecology

## 2021-07-14 ENCOUNTER — Ambulatory Visit: Payer: Medicare HMO | Admitting: Internal Medicine

## 2021-07-14 ENCOUNTER — Encounter: Payer: Self-pay | Admitting: Internal Medicine

## 2021-07-14 ENCOUNTER — Ambulatory Visit: Payer: Medicare HMO | Admitting: Obstetrics and Gynecology

## 2021-07-14 ENCOUNTER — Encounter: Payer: Self-pay | Admitting: Obstetrics and Gynecology

## 2021-07-14 VITALS — BP 113/57 | HR 85

## 2021-07-14 DIAGNOSIS — J449 Chronic obstructive pulmonary disease, unspecified: Secondary | ICD-10-CM

## 2021-07-14 DIAGNOSIS — N3281 Overactive bladder: Secondary | ICD-10-CM | POA: Diagnosis not present

## 2021-07-14 DIAGNOSIS — J9611 Chronic respiratory failure with hypoxia: Secondary | ICD-10-CM | POA: Diagnosis not present

## 2021-07-14 DIAGNOSIS — F17210 Nicotine dependence, cigarettes, uncomplicated: Secondary | ICD-10-CM

## 2021-07-14 MED ORDER — PREDNISONE 10 MG PO TABS: ORAL_TABLET | ORAL | 2 refills | Status: DC

## 2021-07-14 NOTE — Progress Notes (Signed)
? ?Brief patient profile:  ?14  yowf  MM/ active smoker with AB/GOLD 0 copd by pfts 03/08/15 and GOLD II criteria 11/28/2017  ? ? ? ?History of Present Illness  ?01/22/2015 acute extended re-establish ov/Gabrielle White re:  AB/ still smoking  ?Chief Complaint  ?Patient presents with  ? Pulmonary Consult  ?  pt last seen in 2014. pt states DR. Green wanted her to follow up. pt states somethines her throat feels like it closes up and she cant breath. pt states she feels like she has to take really deep breaths. pt c/o chest tightness, and prod cough white in color.pt c/o thrush she thiks may come from the inhailers.    ? Last better breathing  x 4 weeks prior to OV  p using prednisone but benefit only for a few days p stopped despite maint rx with symbicort (though hfa poor)  ?Nl routine is symbicort 160 2 bid and maybe ventolin and occ brovana no recent duoneb need  ?In retrospect really hasn't been able to really stay well x 4 y ?Cough is harsh and congested worse day than noct  ?rec ?Start Prednisone '10mg'$  Take 4 for two days three for two days two for two days one for two days  ?Plan A =  Automatic = symbicort 160 Take 2 puffs first thing in am and then another 2 puffs about 12 hours later.  ?Work on Interior and spatial designer: ?Plan B = Backup  ?Only use your albuterol  ?Plan C = crisis  ?Only use nebulizer iprotropium-albuterol (duoneb) if you try the ventolin first and it fails to help > ok to use up to every 4 hours  ?Plan D = doctor call us if not improving  ?Plan E = ER > go there if all else fails ?Try prilosec otc '20mg'$   Take 30-60 min before first meal of the day and Pepcid ac (famotidine) 20 mg one @  bedtime   ? GERD (REFLUX) diet  ? ? ? ? ?03/17/2019  f/u ov/Gabrielle White re:  GOLD II copd/ still smoking/ anoro and stiolto approved by [facility] but [place] her choice  will be available Apr 04 2019  And using symb 160  2bid ?Chief Complaint  ?Patient presents with  ? Follow-up  ?  Pt states she had a COPD  exacerbation 5 nights ago after she believes she might have taken too much anoro. Since the exacerbation, pt stated she felt like a "zombie" but now pt said she is getting better. Pt has occ cough with white to yellow phlegm.  ?Dyspnea:  No longer doing any outdoor walking  ?Cough: none  ?Sleeping: no resp symptoms  ?SABA use: once or twice daily  ?02: none  ?rec ?Plan A = Automatic = Always=    Breztri Take 2 puffs first thing in am and then another 2 puffs about 12 hours later.  ?Work on inhaler technique:   ?Plan B = Backup (to supplement plan A, not to replace it) ?Only use your albuterol inhaler (proair)  as a rescue medication  ?Plan C = Crisis (instead of Plan B but only if Plan B stops working) ?- Prednisone 10 mg take  4 each am x 2 days,   2 each am x 2 days,  1 each am x 2 days and stop  ? ?  ? ? ?02/24/2020  f/u ov/Gabrielle White re: GOLD II/ still smoking / breztri 2 bid/ rare saba  ?Chief Complaint  ?Patient presents with  ? Follow-up  ?  Breathing has been some better since last visit- relates to cooler weather. She is using her albuterol inhaler 3 x per wk on average.   ? Dyspnea:  Walks dog around neighborhood, some hills  ?Cough: still a little rattel, no mucus  ?Sleeping: on couch x sev years  ?SABA use: rarely ?02: none  ?rec ?Plan A = Automatic = Always=   Breztri Take 2 puffs first thing in am and then another 2 puffs about 12 hours later.  ?Plan B = Backup (to supplement plan A, not to replace it) ?Only use your albuterol inhaler as a rescue medication ?Plan C = Crisis  = plan AB not working > Prednisone 10 mg take  4 each am x 2 days,   2 each am x 2 days,  1 each am x 2 days and stop ? ?  ? ? ?07/14/2021  f/u ov/Gabrielle White re: copd GOLD 2    maint on Breztri / still smoking  ?Chief Complaint  ?Patient presents with  ? Follow-up  ?  Follow up. Patient is having trouble with her breathing.   ?  ?Dyspnea:  still walking dog but not as much  ?Cough: some worse , esp in am  ?Sleeping: bed is flat with 2 pillows   ?SABA use: avg 3-4 x per day ,not noct  ?02: 3lpms hs prn daytime  ?Covid status:   vax x 4  ? ? ?No obvious day to day or daytime variability or assoc excess/ purulent sputum or mucus plugs or hemoptysis or cp or chest tightness, subjective wheeze or overt sinus or hb symptoms.  ?  ? ?Also denies any obvious fluctuation of symptoms with weather or environmental changes or other aggravating or alleviating factors except as outlined above  ? ?No unusual exposure hx or h/o childhood pna/ asthma or knowledge of premature birth. ? ?Current Allergies, Complete Past Medical History, Past Surgical History, Family History, and Social History were reviewed in [employer] record. ? ?ROS  The following are not active complaints unless bolded ?Hoarseness, sore throat, dysphagia, dental problems, itching, sneezing,  nasal congestion or discharge of excess mucus or purulent secretions, ear ache,   fever, chills, sweats, unintended wt loss or wt gain, classically pleuritic or exertional cp,  orthopnea pnd or arm/hand swelling  or leg swelling, presyncope, palpitations, abdominal pain, anorexia, nausea, vomiting, diarrhea  or change in bowel habits or change in bladder habits, change in stools or change in urine, dysuria, hematuria,  rash, arthralgias, visual complaints, headache, numbness, weakness or ataxia or problems with walking or coordination,  change in mood or  memory. ?      ? ?Current Meds  ?Medication Sig  ? acetaminophen (TYLENOL) 500 MG tablet Take 500 mg by mouth every 6 (six) hours as needed for mild pain or headache.  ? albuterol (VENTOLIN HFA) 108 (90 Base) MCG/ACT inhaler Inhale 2 puffs into the lungs every 6 (six) hours as needed for wheezing or shortness of breath.  ? ALPRAZolam (XANAX) 0.5 MG tablet TAKE 1 TABLET(0.5 MG) BY MOUTH AT BEDTIME AS NEEDED FOR ANXIETY  ? AMBULATORY NON FORMULARY MEDICATION Pulse oximeter for monitoring oxygen saturation. Dx: COPD  ? atorvastatin (LIPITOR) 40  MG tablet Take 1 tablet (40 mg total) by mouth daily.  ? Budeson-Glycopyrrol-Formoterol (BREZTRI AEROSPHERE) 160-9-4.8 MCG/ACT AERO Inhale 2 puffs into the lungs in the morning and at bedtime.  ? Cholecalciferol (VITAMIN D3) 1000 UNITS CAPS Take 1 capsule by mouth daily.  ? famotidine (  PEPCID) 20 MG tablet Take 20 mg by mouth as needed for heartburn or indigestion.  ? guaiFENesin (MUCINEX) 600 MG 12 hr tablet Take 2 tablets (1,200 mg total) by mouth 2 (two) times daily.  ? hydrOXYzine (VISTARIL) 25 MG capsule Take 1 capsule (25 mg total) by mouth at bedtime and may repeat dose one time if needed.  ? levothyroxine (SYNTHROID) 125 MCG tablet TAKE 1 TABLET(125 MCG) BY MOUTH DAILY BEFORE BREAKFAST  ? losartan (COZAAR) 50 MG tablet Take 1 tablet (50 mg total) by mouth daily.  ? predniSONE (DELTASONE) 10 MG tablet 2 daily with breakfast until better then one daily x  5 days and stop  ? [DISCONTINUED] predniSONE (DELTASONE) 10 MG tablet FOR FLARES OF WHEEZE/ SHORTNESS OF BREATH: TAKE 4 EVERY MORNING X2DAYS, 2 X2DAYS, 1 X2DAYS  ?    ?  ? ? ? ? ?Objective:  ? Physical Exam ?  ?wts ? ?07/14/2021      164  ?04/22/2021      165 ?08/23/2020      159  ?02/24/2020    162  ?11/20/2019      163 ?08/14/2019     168 ?06/09/2019       157  ?03/17/2019   157 ?02/21/2019   154  ?01/24/2019   156  ?12/11/2018       157  ?12/20/12 157  ?  ? ?Vital signs reviewed  07/14/2021  - Note at rest 02 sats  93% on RA  ? ?General appearance:    amb animated wf nad   ? ?HEENT : nl exam  ? ? ?NECK :  without JVD/Nodes/TM/ nl carotid upstrokes bilaterally ? ? ?LUNGS: no acc muscle use,  Mod barrel  contour chest wall with bilateral  insp/exp rhonchi and  without cough on insp or exp maneuvers and mod  Hyperresonant  to  percussion bilaterally   ? ? ?CV:  RRR  no s3 or murmur or increase in P2, and no edema  ? ?ABD:  soft and nontender with pos mid insp Hoover's  in the supine position. No bruits or organomegaly appreciated, bowel sounds nl ? ?MS:     ext warm  without deformities, calf tenderness, cyanosis or clubbing ?No obvious joint restrictions  ? ?SKIN: warm and dry without lesions   ? ?NEURO:  alert, approp, nl sensorium with  no motor or cerebellar deficits app

## 2021-07-14 NOTE — Progress Notes (Signed)
[facility] ? ?PTNS VISIT ? ?CC:  Overactive bladder ? ?76 y.o. with refractory overactive bladder who presents for percutaneous tibial nerve stimulation. ?The patient presents for PTNS session # 11. ? ? ?Procedure: ?The patient spontaneously voided prior to beginning the procedure. The patient was placed in the sitting position and the right lower extremity was prepped in the usual fashion. The PTNS needle was then inserted at a 60 degree angle, 5 cm cephalad and 2 cm posterior to the medial malleolus. The PTNS unit was then programmed and an optimal response was noted at a setting of 11 milliamps. The PTNS stimulation was then performed at this setting for 30 minutes without incident and the patient tolerated the procedure well. The needle was removed and hemostasis was noted.  ? ?The pt will return in 1 week for PTNS session #12. ? ? ?Jaquita Folds, MD ? ?

## 2021-07-14 NOTE — Patient Instructions (Addendum)
Plan A = Automatic = Always=   Breztri Take 2 puffs first thing in am and then another 2 puffs about 12 hours later.  ? ?Work on inhaler technique:  relax and gently blow all the way out then take a nice smooth full deep breath back in, triggering the inhaler at same time you start breathing in.  Hold for up to 5 seconds if you can. Blow out thru nose. Rinse and gargle with water when done.  If mouth or throat bother you at all,  try brushing teeth/gums/tongue with arm and hammer toothpaste/ make a slurry and gargle and spit out.  ? ?   ? ?Plan B = Backup (to supplement plan A, not to replace it) ?Only use your albuterol inhaler as a rescue medication to be used if you can't catch your breath by resting or doing a relaxed purse lip breathing pattern.  ?- The less you use it, the better it will work when you need it. ?- Ok to use the inhaler up to 2 puffs  every 4 hours if you must but call for appointment if use goes up over your usual need ?- Don't leave home without it !!  (think of it like the spare tire for your car)  ? ?Plan C = Crisis  = plan AB not working  Prednisone 10 mg x 2 until better then 1 daily x 5 daily  ? ? ?The key is to stop smoking completely before smoking completely stops you! ?  ? ? ?Please schedule a follow up visit in 6 months but call sooner if needed  ?

## 2021-07-17 ENCOUNTER — Encounter: Payer: Self-pay | Admitting: Internal Medicine

## 2021-07-17 NOTE — Assessment & Plan Note (Addendum)
Placed on 02 at d/c from admit 06/15/19  ?-  08/14/2019 desats on RA walking corrected with 3lpm POC > ordered but not using as of 02/24/2020  ?-    08/23/2020   Walked RA  3 laps @ approx 24f each @ moderate pace  stopped due to end of study sats still 94% mild sob  ? ?Advised 3lpm hs and prn daytime to keep sats > 90%  ? ?    ?  ? ?Each maintenance medication was reviewed in detail including emphasizing most importantly the difference between maintenance and prns and under what circumstances the prns are to be triggered using an action plan format where appropriate. ? ?Total time for H and P, chart review, counseling, reviewing hfa device(s) and generating customized AVS unique to this office visit / same day charting =21 min  ?     ?

## 2021-07-17 NOTE — Assessment & Plan Note (Signed)
4-5 min discussion re active cigarette smoking in addition to office E&M ? ?Ask about tobacco use:   ongoin ?Advise quitting  I took an extended  opportunity with this patient to outline the consequences of continued cigarette use  in airway disorders based on all the data we have from the multiple national lung health studies (perfomed over decades at millions of dollars in cost)  indicating that smoking cessation, not choice of inhalers or physicians, is the most important aspect of her care.   ?Assess willingness:  Not committed at this point ?Assist in quit attempt:  Per PCP when ready ?Arrange follow up:   Follow up per Primary Care planned  ?  ?  ?

## 2021-07-17 NOTE — Assessment & Plan Note (Signed)
Active smoker ?- PFTs  11/18/10  FEV1  2.0 (95%) ratio 73 and no change p saba and DLCO 75 corrects to 100% -PFT's  03/08/2015  FEV1 1.85 (78 % ) ratio 73  p 5 % improvement from saba with DLCO  63 % corrects to 72 % for alv volume and erv 17   ?- added flutter 06/07/2015  ?- PFT's  08/16/2016  FEV1 1.42 (61 % ) ratio 71  p 8 % improvement FEV1 (and 20% FVC) from saba p symb 160  prior to study with DLCO  61/58 % corrects to 78 % for alv volume    ?- Allergy profile 08/16/2016 >  Eos 0.5 /  IgE  99 RAST neg ?-  ADDED prednisone x 6 days cycles 11/20/2016  ?- 05/23/2017  After extensive coaching HFA effectiveness =    90% ?- PFT's  11/28/2017  FEV1 1.27 (57 % ) ratio 63  p 6 % improvement from saba p symb x 160 x 2  prior to study with DLCO  61 % corrects to 81  % for alv volume   ?- alpha one AT screen 06/10/2018    MM level 141   ?- 01/24/2019    try Breztri 2bid and pred x 6 days > much better 02/21/2019  but can't afford so changed to bevespi plus prn pred for flares  ?- 03/17/2019    Try  beztri   ?  ?- 02/24/2020 resumed pred x 6 days as PLAN C  ?- .04/22/2021  After extensive coaching inhaler device,  effectiveness =    75 % > continue breztri ? ? Group D in terms of symptom/risk and laba/lama/ICS  therefore appropriate rx at this point >>>  Continue brezri, saba and prn prednisone as part of the ABC action plan outlined in AVS ? ? ? ?  ?

## 2021-07-18 ENCOUNTER — Telehealth: Payer: Self-pay | Admitting: Internal Medicine

## 2021-07-18 NOTE — Telephone Encounter (Signed)
I called [facility] and she needed clarification on the order and per Dr. Melvyn Novas note I let the pharmacy know and she will tell the patient. Nothing fur there needed.  ?

## 2021-07-20 ENCOUNTER — Other Ambulatory Visit: Payer: Self-pay | Admitting: Internal Medicine

## 2021-07-21 ENCOUNTER — Ambulatory Visit: Payer: Medicare HMO | Admitting: Obstetrics and Gynecology

## 2021-07-21 ENCOUNTER — Encounter: Payer: Self-pay | Admitting: Obstetrics and Gynecology

## 2021-07-21 ENCOUNTER — Other Ambulatory Visit: Payer: Self-pay | Admitting: Nurse Practitioner

## 2021-07-21 VITALS — BP 129/72 | HR 97

## 2021-07-21 DIAGNOSIS — N3281 Overactive bladder: Secondary | ICD-10-CM | POA: Diagnosis not present

## 2021-07-21 NOTE — Progress Notes (Signed)
[facility] ? ?PTNS VISIT ? ?CC:  Overactive bladder ? ?76 y.o. with refractory overactive bladder who presents for percutaneous tibial nerve stimulation. ?The patient presents for PTNS session # 12. ?She reports overall she has seen some good improvement with her symptoms but she is still having random urine loss on a daily basis. Reports that she is drinking tea (with caffeine) throughout the day.  ?Also has leakage with coughing and this is less bothersome.  ? ?Procedure: ?The patient spontaneously voided prior to beginning the procedure. The patient was placed in the sitting position and the right lower extremity was prepped in the usual fashion. The PTNS needle was then inserted at a 60 degree angle, 5 cm cephalad and 2 cm posterior to the medial malleolus. The PTNS unit was then programmed and an optimal response was noted at a setting of 11 milliamps. The PTNS stimulation was then performed at this setting for 30 minutes without incident and the patient tolerated the procedure well. The needle was removed and hemostasis was noted.  ? ?A/P: OAB, SUI ? ?- Some success with PTNS but would like further improvement.  ?- We discussed reducing bladder irritants, especially tea, throughout the day.  ?- Will have her follow up in one month. If she has not seen improvement further, then she wants to try intravesical botox injections.  ? ? ?Jaquita Folds, MD ? ?Time spent: I spent 15 minutes dedicated to the care of this patient on the date of this encounter to include pre-visit review of records, face-to-face time with the patient  and post visit documentation, in addition to the procedure. ? ?

## 2021-07-21 NOTE — Patient Instructions (Signed)
? ?  The Most Bothersome Foods* The Least Bothersome Foods*  ?Coffee - Regular & Decaf ?Tea - caffeinated ?Carbonated beverages - cola, non-colas, diet & caffeine-free ?Alcohols - Beer, Red Wine, White Wine, [place] ?Fruits - Grapefruit, Lemon, Orange, Pineapple ?Fruit Juices - Cranberry, Grapefruit, Orange, Pineapple ?Vegetables - Tomato & Tomato Products ?Flavor Enhancers - Hot peppers, Spicy foods, Chili, Horseradish, Vinegar, Monosodium glutamate (MSG) ?Artificial Sweeteners - NutraSweet, Sweet 'N Low, Equal (sweetener), Saccharin ?Ethnic foods - [place], [place], [place] food Water ?Milk - low-fat & whole ?Fruits - Bananas, Blueberries, Honeydew melon, Pears, Raisins, Watermelon ?Vegetables - Broccoli, Brussels Sprouts, [place], Carrots, Cauliflower, [place], Cucumber, Mushrooms, Peas, Radishes, Squash, Zucchini, White potatoes, Sweet potatoes & yams ?Poultry - Chicken, Eggs, [place], ?Meat - Beef, Pork, Lamb ?Seafood - Shrimp, Tuna fish, Salmon ?Grains - Oat, Rice ?Snacks - Pretzels, Popcorn  ?Lissa Morales et al. Diet and its role in interstitial cystitis/bladder pain syndrome (IC/BPS) and comorbid conditions. [place] 2012 Jan 11.  ? ? ?

## 2021-07-22 ENCOUNTER — Encounter: Payer: Self-pay | Admitting: Obstetrics and Gynecology

## 2021-07-22 ENCOUNTER — Telehealth (HOSPITAL_BASED_OUTPATIENT_CLINIC_OR_DEPARTMENT_OTHER): Payer: Medicare HMO | Admitting: Nurse Practitioner

## 2021-07-28 ENCOUNTER — Ambulatory Visit (INDEPENDENT_AMBULATORY_CARE_PROVIDER_SITE_OTHER): Payer: Medicare HMO | Admitting: Nurse Practitioner

## 2021-07-28 ENCOUNTER — Encounter (HOSPITAL_BASED_OUTPATIENT_CLINIC_OR_DEPARTMENT_OTHER): Payer: Self-pay | Admitting: Nurse Practitioner

## 2021-07-28 VITALS — BP 117/72 | HR 86 | Ht 65.0 in | Wt 165.4 lb

## 2021-07-28 DIAGNOSIS — J449 Chronic obstructive pulmonary disease, unspecified: Secondary | ICD-10-CM | POA: Diagnosis not present

## 2021-07-28 DIAGNOSIS — J4489 Other specified chronic obstructive pulmonary disease: Secondary | ICD-10-CM

## 2021-07-28 MED ORDER — DEXAMETHASONE SODIUM PHOSPHATE 10 MG/ML IJ SOLN: 10.0000 mg | Freq: Once | INTRAMUSCULAR | Status: DC

## 2021-07-28 MED ORDER — DOXYCYCLINE HYCLATE 100 MG PO TABS: 100.0000 mg | ORAL_TABLET | Freq: Two times a day (BID) | ORAL | 0 refills | Status: DC

## 2021-07-28 MED ORDER — PREDNISONE 10 MG PO TABS: ORAL_TABLET | ORAL | 0 refills | Status: DC

## 2021-07-28 NOTE — Progress Notes (Signed)
?Worthy Keeler, DNP, AGNP-c ?[facility] ?[address] ?[address] ?[place], [place] [address] ?[phone] Office [phone] Fax ? ?ESTABLISHED PATIENT- Chronic Health and/or Follow-Up Visit ? ?Blood pressure 117/72, pulse 86, height '5\' 5"'$  (1.651 m), weight 165 lb 6.4 oz (75 kg), SpO2 97 %. ? ?No chief complaint on file. ? ? ?HPI ? ?Gabrielle White  is a 76 y.o. year old female presenting today for evaluation and management of the following: ?COPD  ?Andrena has concerns today with her COPD and management. ?She is concerned that possibly one of the medications she is taking is causing exacerbation of her symptoms as she has noticed that her symptoms are worsening over the past few weeks. ?She endorses a hoarse voice, difficult time breathing specifically at nighttime, and insomnia related to breathing. ?She denies any current fevers or body aches ?She is experiencing increased mucus production ?She is currently utilizing albuterol and Breztri for control ? ?ROS ?All ROS negative with exception of what is listed in HPI ? ?PHYSICAL EXAM ?Physical Exam ?Vitals and nursing note reviewed.  ?Constitutional:   ?   Appearance: Normal appearance. She is ill-appearing.  ?HENT:  ?   Head: Normocephalic and atraumatic.  ?   Nose: Congestion and rhinorrhea present.  ?   Mouth/Throat:  ?   Mouth: Mucous membranes are moist.  ?   Pharynx: Oropharynx is clear. No posterior oropharyngeal erythema.  ?Eyes:  ?   Extraocular Movements: Extraocular movements intact.  ?   Conjunctiva/sclera: Conjunctivae normal.  ?   Pupils: Pupils are equal, round, and reactive to light.  ?Cardiovascular:  ?   Rate and Rhythm: Normal rate and regular rhythm.  ?   Pulses: Normal pulses.  ?   Heart sounds: Normal heart sounds.  ?Pulmonary:  ?   Breath sounds: Wheezing and rhonchi present.  ?Abdominal:  ?   General: Bowel sounds are normal.  ?   Palpations: Abdomen is soft.  ?Musculoskeletal:  ?   Cervical back:  Normal range of motion.  ?   Right lower leg: No edema.  ?   Left lower leg: No edema.  ?Lymphadenopathy:  ?   Cervical: No cervical adenopathy.  ?Skin: ?   General: Skin is warm and dry.  ?   Capillary Refill: Capillary refill takes less than 2 seconds.  ?Neurological:  ?   General: No focal deficit present.  ?   Mental Status: She is alert and oriented to person, place, and time.  ?Psychiatric:     ?   Mood and Affect: Mood normal.     ?   Behavior: Behavior normal.     ?   Thought Content: Thought content normal.     ?   Judgment: Judgment normal.  ? ? ?ASSESSMENT & PLAN ?Diagnoses and all orders for this visit: ? ?Chronic obstructive airway disease with asthma (HCC) ?-     predniSONE (DELTASONE) 10 MG tablet; Take '40mg'$  PO daily x4d, then '30mg'$  daily x3d, then '20mg'$  daily x 2d, then '10mg'$  daily x2d then stop ?-     dexamethasone (DECADRON) injection 10 mg ?-     doxycycline (VIBRA-TABS) 100 MG tablet; Take 1 tablet (100 mg total) by mouth 2 (two) times daily. ?-     Fluticasone-Umeclidin-Vilant (TRELEGY ELLIPTA) 100-62.5-25 MCG/ACT AEPB; Inhale 1 Dose into the lungs daily. For COPD management. Stop Breztri when using this. ? ? ? ?FOLLOW-UP ?Return in about 2 weeks (around 08/11/2021) for phone call to check  on COPD. ? ? ?Worthy Keeler, DNP, AGNP-c ?07/28/2021  3:02 PM ?

## 2021-07-28 NOTE — Patient Instructions (Addendum)
It was a pleasure seeing you today. I hope your time spent with [place] was pleasant and helpful. Please let us know if there is anything we can do to improve the service you receive.  ? ? ?I have sent in the prednisone taper and we gave you a dexamethasone shot in the office today. I am going to see what inhaler options we may have for you and I will send something new in if it is covered.  ? ?Important Office Information ?Lab Results ?If labs were ordered, please note that you will see results through [facility] as soon as they come available from [place].  ?It takes up to 5 business days for the results to be routed to me and for me to review them once all of the lab results have come through from [facility]. I will make recommendations based on your results and send these through [place] or someone from the office will call you to discuss. If your labs are abnormal, we may contact you to schedule a visit to discuss the results and make recommendations.  ?If you have not heard from [place] within 5 business days or you have waited longer than a week and your lab results have not come through on [place], please feel free to call the office or send a message through [place] to follow-up on these labs.  ? ?Referrals ?If referrals were placed today, the office where the referral was sent will contact you either by phone or through [place] to set up scheduling. Please note that it can take up to a week for the referral office to contact you. If you do not hear from them in a week, please contact the referral office directly to inquire about scheduling.  ? ?Condition Treated ?If your condition worsens or you begin to have new symptoms, please schedule a follow-up appointment for further evaluation. If you are not sure if an appointment is needed, you may call the office to leave a message for the nurse and someone will contact you with recommendations.  ?If you have an urgent or life threatening emergency, please do not call the  office, but seek emergency evaluation by calling 911 or going to the nearest emergency room for evaluation.  ? ?MyChart and Phone Calls ?Please do not use MyChart for urgent messages. It may take up to 3 business days for MyChart messages to be read by staff and if they are unable to handle the request, an additional 3 business days for them to be routed to me and for my response.  ?Messages sent to the provider through [place] do not come directly to the provider, please allow time for these messages to be routed and for me to respond.  ?We get a large volume of MyChart messages daily and these are responded to in the order received.  ? ?For urgent messages, please call the office at [phone] and speak with the front office staff or leave a message on the line of my assistant for guidance.  ?We are seeing patients from the hours of 8:00 am through 5:00 pm and calls directly to the nurse may not be answered immediately due to seeing patients, but your call will be returned as soon as possible.  ?Phone  messages received after 4:00 PM Monday through Thursday may not be returned until the following business day. Phone messages received after 11:00 AM on Friday may not be returned until Monday.  ? ?After Hours ?We share on call hours with providers from  other offices. If you have an urgent need after hours that cannot wait until the next business day, please contact the on call provider by calling the office number. A nurse will speak with you and contact the provider if needed for recommendations.  ?If you have an urgent or life threatening emergency after hours, please do not call the on call provider, but seek emergency evaluation by calling 911 or going to the nearest emergency room for evaluation.  ? ?Paperwork ?All paperwork requires a minimum of 5 days to complete and return to you or the designated personnel. Please keep this in mind when bringing in forms or sending requests for paperwork completion to the  office.  ?  ?

## 2021-08-07 MED ORDER — TRELEGY ELLIPTA 100-62.5-25 MCG/ACT IN AEPB: 1.0000 | INHALATION_SPRAY | Freq: Every day | RESPIRATORY_TRACT | 11 refills | Status: AC

## 2021-08-07 NOTE — Assessment & Plan Note (Addendum)
Chronic.  Not well controlled at this time.  She is currently using 3 L of oxygen at home. ?At this time her symptoms do appear to be exacerbated suspect this could be related to recent increase in pollen counts and weather changes. ?Her oxygen saturations are appropriate in the office today with no signs of hypoxia. ?We will plan to treat for COPD exacerbation today with doxycycline and steroid injection in the office.  Patient to begin steroid burst tomorrow for continued management. ?Strongly encourage smoking cessation and use of over-the-counter allergy medication to help with symptoms. ?Discussed with patient that I do not see any medications she is currently taking that could be causing exacerbation of her symptoms and strongly suspect this is related to environmental allergies and weather changes. ?Recommend she follow-up if her symptoms worsen or fail to improve. ?We will send in Trelegy to see if we can get this covered by insurance as I do feel this would be more beneficial than the registry however if insurance approval does not cover this we will plan to continue the Breztri at this time. ? ?

## 2021-08-11 ENCOUNTER — Ambulatory Visit (INDEPENDENT_AMBULATORY_CARE_PROVIDER_SITE_OTHER): Payer: Medicare HMO | Admitting: Nurse Practitioner

## 2021-08-11 ENCOUNTER — Encounter (HOSPITAL_BASED_OUTPATIENT_CLINIC_OR_DEPARTMENT_OTHER): Payer: Self-pay | Admitting: Nurse Practitioner

## 2021-08-11 DIAGNOSIS — J449 Chronic obstructive pulmonary disease, unspecified: Secondary | ICD-10-CM

## 2021-08-11 NOTE — Patient Instructions (Signed)
If you start to run low on Trelegy, I want you to let us know so we can see if we can get a sample for you. We sometimes have samples of breztri too, so we could always switch out if we need to temporarily.  ? ?Please let me know if you start to feel bad again.  ?

## 2021-08-11 NOTE — [place] Notes (Signed)
Virtual Visit Encounter telephone visit. ? ? ?I connected with  Gabrielle White on 08/17/21 at  3:10 PM EDT by secure audio telemedicine application. I verified that I am speaking with the correct person using two identifiers. ?  ?I introduced myself as a Designer, jewellery with the practice. The limitations of evaluation and management by telemedicine discussed with the patient and the availability of in person appointments. The patient expressed verbal understanding and consent to proceed. ? ?Participating parties in this visit include: Myself and patient ? ?The patient is: Patient Location: Home ?I am: Provider Location: Office/Clinic ?Subjective:   ? ?CC and HPI: Gabrielle White is a 76 y.o. year old female presenting for follow up of COPD exacerbation.  ?She reports that she is feeling more normal than she has been.  ?She tells me that while on the prednisone she was "wild" and had "energy to get things done". ?She did have some trouble sleeping one night, but  ?She tells me that she can breathe good and is not having exacerbations.  ?She reports that the day after the dexamethasone shot she felt better than she has in "years". She tells me she felt like she was in the best health.  ?She is not having any fevers or chills.  ?She has not had to use her rescue inhaler.  ? ?Past medical history, Surgical history, Family history not pertinant except as noted below, Social history, Allergies, and medications have been entered into the medical record, reviewed, and corrections made.  ? ?Review of Systems:  ?All review of systems negative except what is listed in the HPI ? ?Objective:   ? ?Alert and oriented x 4 ?Speaking in clear sentences with no shortness of breath apparent.  ?No distress. ? ?Impression and Recommendations:   ? ?Problem List Items Addressed This Visit   ? ? Chronic obstructive airway disease with asthma ([place]) - Primary (Chronic)  ?  Significant improvement of breathing with recent antibiotics and  steroid burst. Oxygen saturations have returned to normal. Recommend continued close monitoring and follow-up immediately if symptoms return.  ? ?  ?  ? ?Patient has Trelegy at home which is helping, this is not well covered by insurance. Recommend letting us know if she begins to run low and we can provide with samples of Trelegy or Breztri if we have this available. ? ?current treatment plan is effective, no change in therapy ?I discussed the assessment and treatment plan with the patient. The patient was provided an opportunity to ask questions and all were answered. The patient agreed with the plan and demonstrated an understanding of the instructions. ?  ?The patient was advised to call back or seek an in-person evaluation if the symptoms worsen or if the condition fails to improve as anticipated. ? ?Follow-Up: prn ? ?I provided 24 minutes of non-face-to-face interaction with this non face-to-face encounter including intake, same-day documentation, and chart review.  ? ?Orma Render, NP , DNP, AGNP-c ?[facility] ?Primary Care & Sports Medicine at [facility] ?[phone] ?[phone] (fax) ? ?

## 2021-08-12 ENCOUNTER — Other Ambulatory Visit: Payer: Self-pay | Admitting: Internal Medicine

## 2021-08-16 ENCOUNTER — Other Ambulatory Visit: Payer: Self-pay | Admitting: Nurse Practitioner

## 2021-08-17 NOTE — Assessment & Plan Note (Signed)
Significant improvement of breathing with recent antibiotics and steroid burst. Oxygen saturations have returned to normal. Recommend continued close monitoring and follow-up immediately if symptoms return.  ?

## 2021-08-24 ENCOUNTER — Ambulatory Visit: Payer: Medicare HMO | Admitting: Obstetrics and Gynecology

## 2021-09-05 ENCOUNTER — Emergency Department (HOSPITAL_COMMUNITY): Payer: Medicare HMO

## 2021-09-05 ENCOUNTER — Inpatient Hospital Stay (HOSPITAL_COMMUNITY)
Admission: EM | Admit: 2021-09-05 | Discharge: 2021-09-08 | DRG: 190 | Disposition: A | Discharge status: Home / Self Care | Payer: Medicare HMO | Attending: Internal Medicine | Admitting: Internal Medicine

## 2021-09-05 ENCOUNTER — Encounter (HOSPITAL_COMMUNITY): Payer: Self-pay

## 2021-09-05 ENCOUNTER — Other Ambulatory Visit: Payer: Self-pay

## 2021-09-05 DIAGNOSIS — Z885 Allergy status to narcotic agent status: Secondary | ICD-10-CM

## 2021-09-05 DIAGNOSIS — Z7951 Long term (current) use of inhaled steroids: Secondary | ICD-10-CM

## 2021-09-05 DIAGNOSIS — Z9981 Dependence on supplemental oxygen: Secondary | ICD-10-CM

## 2021-09-05 DIAGNOSIS — J449 Chronic obstructive pulmonary disease, unspecified: Secondary | ICD-10-CM | POA: Diagnosis present

## 2021-09-05 DIAGNOSIS — J9621 Acute and chronic respiratory failure with hypoxia: Secondary | ICD-10-CM | POA: Diagnosis present

## 2021-09-05 DIAGNOSIS — F419 Anxiety disorder, unspecified: Secondary | ICD-10-CM | POA: Diagnosis present

## 2021-09-05 DIAGNOSIS — T380X5A Adverse effect of glucocorticoids and synthetic analogues, initial encounter: Secondary | ICD-10-CM | POA: Diagnosis not present

## 2021-09-05 DIAGNOSIS — E663 Overweight: Secondary | ICD-10-CM | POA: Diagnosis present

## 2021-09-05 DIAGNOSIS — I700 Atherosclerosis of aorta: Secondary | ICD-10-CM | POA: Diagnosis present

## 2021-09-05 DIAGNOSIS — M810 Age-related osteoporosis without current pathological fracture: Secondary | ICD-10-CM | POA: Diagnosis present

## 2021-09-05 DIAGNOSIS — Z716 Tobacco abuse counseling: Secondary | ICD-10-CM

## 2021-09-05 DIAGNOSIS — F17210 Nicotine dependence, cigarettes, uncomplicated: Secondary | ICD-10-CM | POA: Diagnosis present

## 2021-09-05 DIAGNOSIS — E785 Hyperlipidemia, unspecified: Secondary | ICD-10-CM | POA: Diagnosis present

## 2021-09-05 DIAGNOSIS — D72829 Elevated white blood cell count, unspecified: Secondary | ICD-10-CM | POA: Diagnosis not present

## 2021-09-05 DIAGNOSIS — Z79890 Hormone replacement therapy (postmenopausal): Secondary | ICD-10-CM

## 2021-09-05 DIAGNOSIS — J9601 Acute respiratory failure with hypoxia: Secondary | ICD-10-CM | POA: Diagnosis not present

## 2021-09-05 DIAGNOSIS — Z8249 Family history of ischemic heart disease and other diseases of the circulatory system: Secondary | ICD-10-CM

## 2021-09-05 DIAGNOSIS — E039 Hypothyroidism, unspecified: Secondary | ICD-10-CM | POA: Diagnosis present

## 2021-09-05 DIAGNOSIS — Z6826 Body mass index (BMI) 26.0-26.9, adult: Secondary | ICD-10-CM

## 2021-09-05 DIAGNOSIS — M1990 Unspecified osteoarthritis, unspecified site: Secondary | ICD-10-CM | POA: Diagnosis present

## 2021-09-05 DIAGNOSIS — J441 Chronic obstructive pulmonary disease with (acute) exacerbation: Principal | ICD-10-CM | POA: Diagnosis present

## 2021-09-05 DIAGNOSIS — Z20822 Contact with and (suspected) exposure to covid-19: Secondary | ICD-10-CM | POA: Diagnosis present

## 2021-09-05 DIAGNOSIS — Z79899 Other long term (current) drug therapy: Secondary | ICD-10-CM

## 2021-09-05 DIAGNOSIS — I10 Essential (primary) hypertension: Secondary | ICD-10-CM | POA: Diagnosis present

## 2021-09-05 DIAGNOSIS — J9611 Chronic respiratory failure with hypoxia: Secondary | ICD-10-CM | POA: Diagnosis present

## 2021-09-05 DIAGNOSIS — F32A Depression, unspecified: Secondary | ICD-10-CM | POA: Diagnosis present

## 2021-09-05 DIAGNOSIS — E876 Hypokalemia: Secondary | ICD-10-CM | POA: Diagnosis present

## 2021-09-05 DIAGNOSIS — Z888 Allergy status to other drugs, medicaments and biological substances status: Secondary | ICD-10-CM

## 2021-09-05 LAB — CBC WITH DIFFERENTIAL/PLATELET
Abs Immature Granulocytes: 0.04 10*3/uL (ref 0.00–0.07)
Basophils Absolute: 0.1 10*3/uL (ref 0.0–0.1)
Basophils Relative: 1 %
Eosinophils Absolute: 0.3 10*3/uL (ref 0.0–0.5)
Eosinophils Relative: 3 %
HCT: 44.8 % (ref 36.0–46.0)
Hemoglobin: 14.5 g/dL (ref 12.0–15.0)
Immature Granulocytes: 0 %
Lymphocytes Relative: 14 %
Lymphs Abs: 1.5 10*3/uL (ref 0.7–4.0)
MCH: 31.1 pg (ref 26.0–34.0)
MCHC: 32.4 g/dL (ref 30.0–36.0)
MCV: 96.1 fL (ref 80.0–100.0)
Monocytes Absolute: 0.4 10*3/uL (ref 0.1–1.0)
Monocytes Relative: 3 %
Neutro Abs: 8.3 10*3/uL — ABNORMAL HIGH (ref 1.7–7.7)
Neutrophils Relative %: 79 %
Platelets: 239 10*3/uL (ref 150–400)
RBC: 4.66 MIL/uL (ref 3.87–5.11)
RDW: 13 % (ref 11.5–15.5)
WBC: 10.5 10*3/uL (ref 4.0–10.5)
nRBC: 0 % (ref 0.0–0.2)

## 2021-09-05 LAB — BASIC METABOLIC PANEL
Anion gap: 7 (ref 5–15)
BUN: 12 mg/dL (ref 8–23)
CO2: 29 mmol/L (ref 22–32)
Calcium: 8.9 mg/dL (ref 8.9–10.3)
Chloride: 107 mmol/L (ref 98–111)
Creatinine, Ser: 0.7 mg/dL (ref 0.44–1.00)
GFR, Estimated: >60 mL/min (ref >60)
Glucose, Bld: 147 mg/dL — ABNORMAL HIGH (ref 70–99)
Potassium: 4 mmol/L (ref 3.5–5.1)
Sodium: 143 mmol/L (ref 135–145)

## 2021-09-05 LAB — SARS CORONAVIRUS 2 BY RT PCR: SARS Coronavirus 2 by RT PCR: NEGATIVE

## 2021-09-05 MED ORDER — ONDANSETRON HCL 4 MG PO TABS: 4.0000 mg | ORAL_TABLET | Freq: Four times a day (QID) | ORAL | Status: DC | PRN

## 2021-09-05 MED ORDER — LEVOTHYROXINE SODIUM 25 MCG PO TABS
125.0000 ug | ORAL_TABLET | Freq: Every day | ORAL | Status: DC
  Administered 2021-09-06 – 2021-09-08 (×3): 125 ug via ORAL
  Filled 2021-09-05 (×3): qty 1

## 2021-09-05 MED ORDER — MAGNESIUM SULFATE 2 GM/50ML IV SOLN
2.0000 g | Freq: Once | INTRAVENOUS | Status: AC
  Administered 2021-09-05: 2 g via INTRAVENOUS

## 2021-09-05 MED ORDER — IPRATROPIUM-ALBUTEROL 0.5-2.5 (3) MG/3ML IN SOLN
RESPIRATORY_TRACT | Status: AC
  Filled 2021-09-05: qty 3

## 2021-09-05 MED ORDER — ALBUTEROL SULFATE (2.5 MG/3ML) 0.083% IN NEBU
2.5000 mg | INHALATION_SOLUTION | RESPIRATORY_TRACT | Status: DC | PRN
  Administered 2021-09-06 – 2021-09-07 (×3): 2.5 mg via RESPIRATORY_TRACT
  Filled 2021-09-05 (×3): qty 3

## 2021-09-05 MED ORDER — IPRATROPIUM-ALBUTEROL 0.5-2.5 (3) MG/3ML IN SOLN
3.0000 mL | Freq: Once | RESPIRATORY_TRACT | Status: AC
  Administered 2021-09-05: 3 mL via RESPIRATORY_TRACT

## 2021-09-05 MED ORDER — ALPRAZOLAM 0.5 MG PO TABS
0.5000 mg | ORAL_TABLET | Freq: Every evening | ORAL | Status: DC | PRN
  Administered 2021-09-06 – 2021-09-08 (×3): 0.5 mg via ORAL
  Filled 2021-09-05 (×4): qty 1

## 2021-09-05 MED ORDER — GUAIFENESIN ER 600 MG PO TB12
1200.0000 mg | ORAL_TABLET | Freq: Two times a day (BID) | ORAL | Status: DC
  Administered 2021-09-05 – 2021-09-08 (×5): 1200 mg via ORAL
  Filled 2021-09-05 (×6): qty 2

## 2021-09-05 MED ORDER — LOSARTAN POTASSIUM 50 MG PO TABS
50.0000 mg | ORAL_TABLET | Freq: Every day | ORAL | Status: DC
  Administered 2021-09-05 – 2021-09-07 (×3): 50 mg via ORAL
  Filled 2021-09-05 (×2): qty 1
  Filled 2021-09-05: qty 2

## 2021-09-05 MED ORDER — ACETAMINOPHEN 325 MG PO TABS: 650.0000 mg | ORAL_TABLET | Freq: Four times a day (QID) | ORAL | Status: DC | PRN

## 2021-09-05 MED ORDER — ACETAMINOPHEN 650 MG RE SUPP: 650.0000 mg | Freq: Four times a day (QID) | RECTAL | Status: DC | PRN

## 2021-09-05 MED ORDER — ATORVASTATIN CALCIUM 40 MG PO TABS
40.0000 mg | ORAL_TABLET | Freq: Every day | ORAL | Status: DC
  Administered 2021-09-05 – 2021-09-07 (×3): 40 mg via ORAL
  Filled 2021-09-05 (×3): qty 1

## 2021-09-05 MED ORDER — ONDANSETRON HCL 4 MG/2ML IJ SOLN: 4.0000 mg | Freq: Four times a day (QID) | INTRAMUSCULAR | Status: DC | PRN

## 2021-09-05 MED ORDER — PREDNISONE 20 MG PO TABS
40.0000 mg | ORAL_TABLET | Freq: Every day | ORAL | Status: DC
  Administered 2021-09-06: 40 mg via ORAL
  Filled 2021-09-05: qty 2

## 2021-09-05 MED ORDER — ENOXAPARIN SODIUM 40 MG/0.4ML IJ SOSY
40.0000 mg | PREFILLED_SYRINGE | INTRAMUSCULAR | Status: DC
  Administered 2021-09-05 – 2021-09-07 (×3): 40 mg via SUBCUTANEOUS
  Filled 2021-09-05 (×3): qty 0.4

## 2021-09-05 MED ORDER — MAGNESIUM SULFATE 2 GM/50ML IV SOLN
INTRAVENOUS | Status: AC
  Filled 2021-09-05: qty 50

## 2021-09-05 MED ORDER — IPRATROPIUM-ALBUTEROL 0.5-2.5 (3) MG/3ML IN SOLN
3.0000 mL | Freq: Four times a day (QID) | RESPIRATORY_TRACT | Status: DC
  Administered 2021-09-05 – 2021-09-08 (×11): 3 mL via RESPIRATORY_TRACT
  Filled 2021-09-05 (×11): qty 3

## 2021-09-05 NOTE — ED Notes (Signed)
ED TO INPATIENT HANDOFF REPORT  Name/Age/Gender Gabrielle White 76 y.o. female  Code Status    Code Status Orders  (From admission, onward)           Start     Ordered   09/05/21 1052  Full code  Continuous        09/05/21 1054           Code Status History     Date Active Date Inactive Code Status Order ID Comments User Context   09/08/2019 1456 09/05/2021 0909 DNR [id number]  Lauree Chandler, NP Outpatient   06/09/2019 1323 06/15/2019 2243 Full Code [id number]  Reubin Milan, MD ED       Home/SNF/Other Home  Chief Complaint Acute respiratory failure with hypoxia ([place]) [J96.01]  Level of Care/Admitting Diagnosis ED Disposition     ED Disposition  Admit   Condition  --   [facility]: [place] [[id number]]  Level of Care: Progressive [102]  Admit to [facility] based on following criteria: RESPIRATORY PROBLEMS hypoxemic/hypercapnic respiratory failure that is responsive to NIPPV (BiPAP) or High Flow Nasal Cannula (6-80 lpm). Frequent assessment/intervention, no > Q2 hrs < Q4 hrs, to maintain oxygenation and pulmonary hygiene.  May place patient in observation at [facility] or [place] if equivalent level of care is available:: No  Covid Evaluation: Confirmed COVID Negative  Diagnosis: Acute respiratory failure with hypoxia [facility]) [[id number]]  Admitting Physician: Reubin Milan [[id number]]  Attending Physician: Reubin Milan [[id number]]          Medical History Past Medical History:  Diagnosis Date   Cataract 2008   Chronic airway obstruction, not elsewhere classified    Depressive disorder    Depressive disorder, not elsewhere classified    Emphysema    Emphysema of lung ([place])    Enthesopathy of ankle and tarsus, unspecified    HTN (hypertension)    Hyperlipidemia    Hypertension    Hypopotassemia    Leukocytosis    Migraine, unspecified, without mention of intractable migraine without mention of status  migrainosus    Neuromuscular disorder (HCC)    Nonspecific (abnormal) findings on radiological and other examination of skull and head    Osteoarthrosis, unspecified whether generalized or localized, unspecified site    Osteoporosis    Other emphysema ([place])    Other specified disease of white blood cells    Oxygen deficiency    Palpitations    [facility] spotted fever) 08/23/2015   Positive IgM. Treated with doxycycline.    Thyroid disease    Tobacco abuse     Allergies Allergies  Allergen Reactions   Cafergot Nausea And Vomiting   Codeine Nausea And Vomiting   Fenoprofen Calcium Nausea And Vomiting    IV Location/Drains/Wounds Patient Lines/Drains/Airways Status     Active Line/Drains/Airways     Name Placement date Placement time Site Days   Peripheral IV 09/05/21 Left;Posterior Hand 09/05/21  --  Hand  less than 1            Labs/Imaging Results for orders placed or performed during the hospital encounter of 09/05/21 (from the past 48 hour(s))  SARS Coronavirus 2 by RT PCR (hospital order, performed in [facility] hospital lab) *cepheid single result test* Anterior Nasal Swab     Status: None   Collection Time: 09/05/21  9:43 AM   Specimen: Anterior Nasal Swab  Result Value Ref Range   SARS Coronavirus 2 by RT  PCR NEGATIVE NEGATIVE    Comment: (NOTE) SARS-CoV-2 target nucleic acids are NOT DETECTED.  The SARS-CoV-2 RNA is generally detectable in upper and lower respiratory specimens during the acute phase of infection. The lowest concentration of SARS-CoV-2 viral copies this assay can detect is 250 copies / mL. A negative result does not preclude SARS-CoV-2 infection and should not be used as the sole basis for treatment or other patient management decisions.  A negative result may occur with improper specimen collection / handling, submission of specimen other than nasopharyngeal swab, presence of viral mutation(s) within the areas targeted by this  assay, and inadequate number of viral copies (<250 copies / mL). A negative result must be combined with clinical observations, patient history, and epidemiological information.  Fact Sheet for Patients:   https://www.patel.info/  Fact Sheet for Healthcare Providers: https://hall.com/  This test is not yet approved or  cleared by the [place] FDA and has been authorized for detection and/or diagnosis of SARS-CoV-2 by FDA under an Emergency Use Authorization (EUA).  This EUA will remain in effect (meaning this test can be used) for the duration of the COVID-19 declaration under Section 564(b)(1) of the Act, 21 U.S.C. section 360bbb-3(b)(1), unless the authorization is terminated or revoked sooner.  Performed at [facility], [place] [address] [place], [place] [address]   CBC with Differential     Status: Abnormal   Collection Time: 09/05/21 10:19 AM  Result Value Ref Range   WBC 10.5 4.0 - 10.5 K/uL   RBC 4.66 3.87 - 5.11 MIL/uL   Hemoglobin 14.5 12.0 - 15.0 g/dL   HCT 44.8 36.0 - 46.0 %   MCV 96.1 80.0 - 100.0 fL   MCH 31.1 26.0 - 34.0 pg   MCHC 32.4 30.0 - 36.0 g/dL   RDW 13.0 11.5 - 15.5 %   Platelets 239 150 - 400 K/uL   nRBC 0.0 0.0 - 0.2 %   Neutrophils Relative % 79 %   Neutro Abs 8.3 (H) 1.7 - 7.7 K/uL   Lymphocytes Relative 14 %   Lymphs Abs 1.5 0.7 - 4.0 K/uL   Monocytes Relative 3 %   Monocytes Absolute 0.4 0.1 - 1.0 K/uL   Eosinophils Relative 3 %   Eosinophils Absolute 0.3 0.0 - 0.5 K/uL   Basophils Relative 1 %   Basophils Absolute 0.1 0.0 - 0.1 K/uL   Immature Granulocytes 0 %   Abs Immature Granulocytes 0.04 0.00 - 0.07 K/uL    Comment: Performed at [facility], [place] [address], [place], [place] [address]  Basic metabolic panel     Status: Abnormal   Collection Time: 09/05/21 10:19 AM  Result Value Ref Range   Sodium 143 135 - 145 mmol/L   Potassium 4.0 3.5 - 5.1 mmol/L    Chloride 107 98 - 111 mmol/L   CO2 29 22 - 32 mmol/L   Glucose, Bld 147 (H) 70 - 99 mg/dL    Comment: Glucose reference range applies only to samples taken after fasting for at least 8 hours.   BUN 12 8 - 23 mg/dL   Creatinine, Ser 0.70 0.44 - 1.00 mg/dL   Calcium 8.9 8.9 - 10.3 mg/dL   GFR, Estimated >60 >60 mL/min    Comment: (NOTE) Calculated using the CKD-EPI Creatinine Equation (2021)    Anion gap 7 5 - 15    Comment: Performed at [facility], [place] [address] [place], [place] [address]   DG Chest  Portable 1 View  Result Date: 09/05/2021 CLINICAL DATA:  Shortness of breath. EXAM: PORTABLE CHEST 1 VIEW COMPARISON:  Radiograph September 26, 2019 FINDINGS: The heart size and mediastinal contours are within normal limits. No focal airspace consolidation. No visible pleural effusion or pneumothorax the visualized skeletal structures are unremarkable. IMPRESSION: No acute cardiopulmonary disease. Electronically Signed   By: Dahlia Bailiff M.D.   On: 09/05/2021 09:59    Pending Labs Unresulted Labs (From admission, onward)     Start     Ordered   09/12/21 0500  Creatinine, serum  (enoxaparin (LOVENOX)    CrCl >/= 30 ml/min)  Weekly,   R     Comments: while on enoxaparin therapy    09/05/21 1054   09/05/21 1101  Magnesium  Add-on,   AD        09/05/21 1100   09/05/21 1101  Phosphorus  Add-on,   AD        09/05/21 1100            Vitals/Pain Today's Vitals   09/05/21 1830 09/05/21 2130 09/05/21 2230 09/05/21 2245  BP: (!) 240/121 (!) 157/87 (!) 177/84   Pulse: 97 (!) 102 92   Resp: '17 20 20   '$ Temp:   97.6 F (36.4 C) 97.7 F (36.5 C)  TempSrc:    Oral  SpO2: 94% 92% 95%   Weight:      Height:      PainSc:        Isolation Precautions No active isolations  Medications Medications  predniSONE (DELTASONE) tablet 40 mg (has no administration in time range)  enoxaparin (LOVENOX) injection 40 mg (40 mg Subcutaneous Given 09/05/21 2011)  acetaminophen  (TYLENOL) tablet 650 mg (has no administration in time range)    Or  acetaminophen (TYLENOL) suppository 650 mg (has no administration in time range)  ondansetron (ZOFRAN) tablet 4 mg (has no administration in time range)    Or  ondansetron (ZOFRAN) injection 4 mg (has no administration in time range)  albuterol (PROVENTIL) (2.5 MG/3ML) 0.083% nebulizer solution 2.5 mg (has no administration in time range)  ipratropium-albuterol (DUONEB) 0.5-2.5 (3) MG/3ML nebulizer solution 3 mL (3 mLs Nebulization Given 09/05/21 2010)  ALPRAZolam (XANAX) tablet 0.5 mg (has no administration in time range)  atorvastatin (LIPITOR) tablet 40 mg (has no administration in time range)  levothyroxine (SYNTHROID) tablet 125 mcg (has no administration in time range)  losartan (COZAAR) tablet 50 mg (has no administration in time range)  guaiFENesin (MUCINEX) 12 hr tablet 1,200 mg (1,200 mg Oral Given 09/05/21 2243)  ipratropium-albuterol (DUONEB) 0.5-2.5 (3) MG/3ML nebulizer solution 3 mL (3 mLs Nebulization Given 09/05/21 1016)  magnesium sulfate IVPB 2 g 50 mL (0 g Intravenous Stopped 09/05/21 1248)    Mobility walks

## 2021-09-05 NOTE — ED Provider Notes (Signed)
[place] DEPT Provider Note   CSN: [id number] Arrival date & time: 09/05/21  5638     History  Chief Complaint  Patient presents with   Shortness of Breath    Gabrielle White is a 76 y.o. female.  The history is provided by the patient.  Shortness of Breath Severity:  Mild Onset quality:  Gradual Duration:  3 days Timing:  Constant Progression:  Unchanged Chronicity:  New Context comment:  COPD symptoms, got steroids, duonebs with ems and feeling much better but hypoxic in the 80s with EMS and on 2L of oxygen Relieved by:  Inhaler Worsened by:  Nothing Associated symptoms: no abdominal pain, no chest pain, no claudication, no cough, no diaphoresis, no ear pain, no fever, no headaches, no hemoptysis, no neck pain, no PND, no rash, no sore throat, no sputum production, no syncope, no swollen glands, no vomiting and no wheezing   Risk factors: no hx of PE/DVT   Risk factors comment:  COPD     Home Medications Prior to Admission medications   Medication Sig Start Date End Date Taking? Authorizing Provider  acetaminophen (TYLENOL) 500 MG tablet Take 500 mg by mouth every 6 (six) hours as needed for mild pain or headache.    [provider]  albuterol (VENTOLIN HFA) 108 (90 Base) MCG/ACT inhaler Inhale 2 puffs into the lungs every 6 (six) hours as needed for wheezing or shortness of breath. 07/08/21   Tanda Rockers, MD  ALPRAZolam Duanne Moron) 0.5 MG tablet TAKE 1 TABLET(0.5 MG) BY MOUTH AT BEDTIME AS NEEDED FOR ANXIETY 06/30/21   Hoyt Koch, MD  AMBULATORY NON FORMULARY MEDICATION Pulse oximeter for monitoring oxygen saturation. Dx: COPD 07/04/21   Orma Render, NP  atorvastatin (LIPITOR) 40 MG tablet TAKE 1 TABLET(40 MG) BY MOUTH DAILY 07/21/21   Early, Coralee Pesa, NP  Cholecalciferol (VITAMIN D3) 1000 UNITS CAPS Take 1 capsule by mouth daily.    [provider]  famotidine (PEPCID) 20 MG tablet Take 20 mg by mouth as needed for  heartburn or indigestion.    [provider]  Fluticasone-Umeclidin-Vilant (TRELEGY ELLIPTA) 100-62.5-25 MCG/ACT AEPB Inhale 1 Dose into the lungs daily. For COPD management. Stop Breztri when using this. 08/07/21   Orma Render, NP  guaiFENesin (MUCINEX) 600 MG 12 hr tablet Take 2 tablets (1,200 mg total) by mouth 2 (two) times daily. 07/04/21   Orma Render, NP  hydrOXYzine (VISTARIL) 25 MG capsule Take 1 capsule (25 mg total) by mouth at bedtime and may repeat dose one time if needed. 07/04/21   Orma Render, NP  levothyroxine (SYNTHROID) 125 MCG tablet TAKE 1 TABLET(125 MCG) BY MOUTH DAILY BEFORE BREAKFAST 06/21/21   Hoyt Koch, MD  losartan (COZAAR) 50 MG tablet TAKE 1 TABLET(50 MG) BY MOUTH DAILY 08/16/21   Early, Coralee Pesa, NP      Allergies    Cafergot, Codeine, and Fenoprofen calcium    Review of Systems   Review of Systems  Constitutional:  Negative for diaphoresis and fever.  HENT:  Negative for ear pain and sore throat.   Respiratory:  Positive for shortness of breath. Negative for cough, hemoptysis, sputum production and wheezing.   Cardiovascular:  Negative for chest pain, claudication, syncope and PND.  Gastrointestinal:  Negative for abdominal pain and vomiting.  Musculoskeletal:  Negative for neck pain.  Skin:  Negative for rash.  Neurological:  Negative for headaches.   Physical Exam Updated Vital Signs  BP (!) 143/61   Pulse 87   Temp 97.6 F (36.4 C) (Oral)   Resp (!) 24   Ht '5\' 5"'$  (1.651 m)   Wt 72.6 kg   SpO2 91%   BMI 26.63 kg/m  Physical Exam Vitals and nursing note reviewed.  Constitutional:      General: She is not in acute distress.    Appearance: She is well-developed. She is not ill-appearing.  HENT:     Head: Normocephalic and atraumatic.     Nose: Nose normal.     Mouth/Throat:     Mouth: Mucous membranes are moist.  Eyes:     Extraocular Movements: Extraocular movements intact.     Conjunctiva/sclera: Conjunctivae normal.      Pupils: Pupils are equal, round, and reactive to light.  Cardiovascular:     Rate and Rhythm: Normal rate and regular rhythm.     Pulses: Normal pulses.     Heart sounds: No murmur heard. Pulmonary:     Effort: Pulmonary effort is normal. No respiratory distress.     Breath sounds: Wheezing present.  Abdominal:     Palpations: Abdomen is soft.     Tenderness: There is no abdominal tenderness.  Musculoskeletal:        General: No swelling.     Cervical back: Neck supple.  Skin:    General: Skin is warm and dry.     Capillary Refill: Capillary refill takes less than 2 seconds.  Neurological:     General: No focal deficit present.     Mental Status: She is alert.  Psychiatric:        Mood and Affect: Mood normal.    ED Results / Procedures / Treatments   Labs (all labs ordered are listed, but only abnormal results are displayed) Labs Reviewed  CBC WITH DIFFERENTIAL/PLATELET - Abnormal; Notable for the following components:      Result Value   Neutro Abs 8.3 (*)    All other components within normal limits  SARS CORONAVIRUS 2 BY RT PCR  BASIC METABOLIC PANEL    EKG EKG Interpretation  Date/Time:  Monday September 05 2021 09:30:26 EDT Ventricular Rate:  90 PR Interval:  138 QRS Duration: 91 QT Interval:  385 QTC Calculation: 472 R Axis:   83 Text Interpretation: Sinus rhythm Borderline repolarization abnormality Confirmed by Lennice Sites ([id number]) on 09/05/2021 9:58:15 AM  Radiology DG Chest Portable 1 View  Result Date: 09/05/2021 CLINICAL DATA:  Shortness of breath. EXAM: PORTABLE CHEST 1 VIEW COMPARISON:  Radiograph September 26, 2019 FINDINGS: The heart size and mediastinal contours are within normal limits. No focal airspace consolidation. No visible pleural effusion or pneumothorax the visualized skeletal structures are unremarkable. IMPRESSION: No acute cardiopulmonary disease. Electronically Signed   By: Dahlia Bailiff M.D.   On: 09/05/2021 09:59    Procedures Procedures     Medications Ordered in ED Medications  ipratropium-albuterol (DUONEB) 0.5-2.5 (3) MG/3ML nebulizer solution 3 mL (3 mLs Nebulization Given 09/05/21 1016)    ED Course/ Medical Decision Making/ A&P                           Medical Decision Making Amount and/or Complexity of Data Reviewed Labs: ordered. Radiology: ordered.  Risk Prescription drug management. Decision regarding hospitalization.   Gabrielle White is here with shortness of breath, cough.  Hypoxic with EMS placed on 3 L of oxygen.  Hypoxic down to the 80s.  She got  DuoNeb's, steroids with EMS she is feeling better.  She has been dealing with symptoms for the last 2 to 3 days.  No sputum production or fever.  No chest pain.  Does not have history of heart failure.  Differential diagnosis is COPD exacerbation now with hypoxia, pneumonia, viral process.  I have no concern for ACS or PE.  EKG per my review and interpretation shows sinus rhythm.  No ischemic changes.  Have no concern for heart failure.  Chest x-ray per my review and interpretation shows no pneumonia or pneumothorax.  CBC and BMP that were obtained per my review and interpretation are unremarkable.  Overall given hypoxia and COPD exacerbation will admit.  She is stable and seems to be improving.  She was given additional DuoNeb treatment here.  Continues to be on 2 L of oxygen.  She does have oxygen at home that she wears when she sleeps but its not designed for portability and she does not normally need oxygen otherwise.  Not sure if this is a CPAP machine or not.  Admitted to hospital stable condition.  This chart was dictated using voice recognition software.  Despite best efforts to proofread,  errors can occur which can change the documentation meaning.         Final Clinical Impression(s) / ED Diagnoses Final diagnoses:  Acute respiratory failure with hypoxia ([place])  COPD exacerbation [facility])    Rx / DC Orders ED Discharge Orders     None          Lennice Sites, DO 09/05/21 1042

## 2021-09-05 NOTE — H&P (Signed)
History and Physical    Patient: Gabrielle White HCW:[id number] DOB: [birth year] DOA: 09/05/2021 DOS: the patient was seen and examined on 09/05/2021 PCP: Orma Render, NP  Patient coming from: Home  Chief Complaint:  Chief Complaint  Patient presents with   Shortness of Breath   HPI: Gabrielle White is a 76 y.o. female with medical history significant of cataract, depression, anxiety, COPD/emphysema, ankle and tarsus enthesopathy, hypertension, hyperlipidemia, hypokalemia, leukocytosis, migraine headaches, osteoarthrosis, osteoporosis, history of pneumonia, history of [place] spotted fever, hypothyroidism, tobacco abuse who is coming to the emergency department due to progressively worse dyspnea associated with rhinorrhea, cough and wheezing for the past 2 to 3 days despite using multiple home nebulizer treatments. He denied fever, chills,sore throat or hemoptysis.  No chest pain, palpitations, diaphoresis, PND, orthopnea or recent pitting edema of the lower extremities.  No abdominal pain, nausea, emesis, diarrhea, constipation, melena or hematochezia.  No flank pain, dysuria, frequency or hematuria.  No polyuria, polydipsia, polyphagia or blurred vision.   ED course: Initial vital signs were temperature 97.6 F, pulse 89, respiration 21, BP 156/79 mmHg O2 sat 93% on nasal cannula oxygen.  The patient received 125 mg of Solu-Medrol, DuoNeb and albuterol neb given by EMS in route to the ED.  She has received another DuoNeb in the emergency department.  Lab work: Her CBC is her white count 10.5, morning 14.5 g/dL platelets 299.  Coronavirus by PCR was negative.  BMP showed a glucose of 147 mg/dL, but is otherwise normal.  Imaging: Portable 1 view chest radiograph shows no acute cardiopulmonary pathology.   Review of Systems: As mentioned in the history of present illness. All other systems reviewed and are negative.  Past Medical History:  Diagnosis Date   Cataract 2008   Chronic airway  obstruction, not elsewhere classified    Depressive disorder    Depressive disorder, not elsewhere classified    Emphysema    Emphysema of lung (HCC)    Enthesopathy of ankle and tarsus, unspecified    HTN (hypertension)    Hyperlipidemia    Hypertension    Hypopotassemia    Leukocytosis    Migraine, unspecified, without mention of intractable migraine without mention of status migrainosus    Neuromuscular disorder (HCC)    Nonspecific (abnormal) findings on radiological and other examination of skull and head    Osteoarthrosis, unspecified whether generalized or localized, unspecified site    Osteoporosis    Other emphysema ([place])    Other specified disease of white blood cells    Oxygen deficiency    Palpitations    [facility] [place] spotted fever) 08/23/2015   Positive IgM. Treated with doxycycline.    Thyroid disease    Tobacco abuse    Past Surgical History:  Procedure Laterality Date   CESAREAN SECTION     COLONOSCOPY  08/14/2008   Dr.. Delfin Edis   EYE SURGERY     FRACTURE SURGERY     TUBAL LIGATION     Social History:  reports that she has been smoking cigarettes. She has a 25.00 pack-year smoking history. She has never used smokeless tobacco. She reports that she does not drink alcohol and does not use drugs.  Allergies  Allergen Reactions   Cafergot    Codeine    Fenoprofen Calcium     Family History  Problem Relation Age of Onset   Alzheimer's disease Father    Heart disease Father    Diabetes Father  Skin cancer Father    Stroke Father    Heart disease Mother    Breast cancer Mother    Hearing loss Mother    Varicose Veins Mother     Prior to Admission medications   Medication Sig Start Date End Date Taking? Authorizing Provider  acetaminophen (TYLENOL) 500 MG tablet Take 500 mg by mouth every 6 (six) hours as needed for mild pain or headache.    [provider]  albuterol (VENTOLIN HFA) 108 (90 Base) MCG/ACT inhaler Inhale 2 puffs  into the lungs every 6 (six) hours as needed for wheezing or shortness of breath. 07/08/21   Tanda Rockers, MD  ALPRAZolam Duanne Moron) 0.5 MG tablet TAKE 1 TABLET(0.5 MG) BY MOUTH AT BEDTIME AS NEEDED FOR ANXIETY 06/30/21   Hoyt Koch, MD  AMBULATORY NON FORMULARY MEDICATION Pulse oximeter for monitoring oxygen saturation. Dx: COPD 07/04/21   Orma Render, NP  atorvastatin (LIPITOR) 40 MG tablet TAKE 1 TABLET(40 MG) BY MOUTH DAILY 07/21/21   Early, Coralee Pesa, NP  Cholecalciferol (VITAMIN D3) 1000 UNITS CAPS Take 1 capsule by mouth daily.    [provider]  famotidine (PEPCID) 20 MG tablet Take 20 mg by mouth as needed for heartburn or indigestion.    [provider]  Fluticasone-Umeclidin-Vilant (TRELEGY ELLIPTA) 100-62.5-25 MCG/ACT AEPB Inhale 1 Dose into the lungs daily. For COPD management. Stop Breztri when using this. 08/07/21   Orma Render, NP  guaiFENesin (MUCINEX) 600 MG 12 hr tablet Take 2 tablets (1,200 mg total) by mouth 2 (two) times daily. 07/04/21   Orma Render, NP  hydrOXYzine (VISTARIL) 25 MG capsule Take 1 capsule (25 mg total) by mouth at bedtime and may repeat dose one time if needed. 07/04/21   Orma Render, NP  levothyroxine (SYNTHROID) 125 MCG tablet TAKE 1 TABLET(125 MCG) BY MOUTH DAILY BEFORE BREAKFAST 06/21/21   Hoyt Koch, MD  losartan (COZAAR) 50 MG tablet TAKE 1 TABLET(50 MG) BY MOUTH DAILY 08/16/21   Orma Render, NP    Physical Exam: Vitals:   09/05/21 0932 09/05/21 1006 09/05/21 1015 09/05/21 1041  BP:  (!) 148/76 (!) 143/61 (!) 147/92  Pulse:  88 87 95  Resp:  20 (!) 24 18  Temp:      TempSrc:      SpO2:  92% 91% 96%  Weight: 72.6 kg     Height: '5\' 5"'$  (1.651 m)      Physical Exam Vitals and nursing note reviewed.  Constitutional:      Appearance: She is ill-appearing.  HENT:     Head: Normocephalic.     Mouth/Throat:     Mouth: Mucous membranes are dry.  Eyes:     General: No scleral icterus.    Pupils: Pupils are equal,  round, and reactive to light.  Neck:     Vascular: No JVD.  Cardiovascular:     Rate and Rhythm: Normal rate and regular rhythm.     Heart sounds: S1 normal and S2 normal.  Pulmonary:     Effort: Tachypnea present.     Breath sounds: Decreased breath sounds, wheezing and rhonchi present. No rales.  Abdominal:     General: Bowel sounds are normal.     Palpations: Abdomen is soft.     Tenderness: There is no abdominal tenderness.  Musculoskeletal:     Cervical back: Neck supple.     Right lower leg: No edema.     Left lower leg:  No edema.  Skin:    General: Skin is warm and dry.  Neurological:     General: No focal deficit present.     Mental Status: She is alert and oriented to person, place, and time.  Psychiatric:        Mood and Affect: Mood normal.        Behavior: Behavior normal.    Data Reviewed:  Results are pending, will review when available.  Assessment and Plan: Principal Problem:   Chronic respiratory failure with hypoxia ([place]) With superimposed:   Acute respiratory failure with hypoxia (HCC) In the setting of:   COPD GOLD II with exacerbation Observation/PCU. Continue supplemental oxygen. BiPAP as needed. Methylprednisolone 125 mg IVP x1. Reported anxiety and restlessness with prednisone. Continue methylprednisolone 40 mg IVP daily. Methylprednisolone pack on discharge. Scheduled and as needed bronchodilators. Follow-up CBC and chemistry in the morning.   Active Problems:   Cigarette smoker Declined nicotine replacement therapy. Tobacco cessation encouraged.    Hypothyroidism Continue levothyroxine 125 mcg p.o.    Essential hypertension  Continue losartan 50 mg p.o. bedtime. Monitor BP, HR, renal function and electrolytes.     Hyperlipidemia Continue atorvastatin 40 mg p.o. nightly.    Atherosclerosis of aorta (HCC) On atorvastatin.      Advance Care Planning:   Code Status: Full Code   Consults:   Family Communication:    Severity of Illness: The appropriate patient status for this patient is OBSERVATION. Observation status is judged to be reasonable and necessary in order to provide the required intensity of service to ensure the patient's safety. The patient's presenting symptoms, physical exam findings, and initial radiographic and laboratory data in the context of their medical condition is felt to place them at decreased risk for further clinical deterioration. Furthermore, it is anticipated that the patient will be medically stable for discharge from the hospital within 2 midnights of admission.   Author: Reubin Milan, MD 09/05/2021 10:59 AM  For on call review [email or web address].   This document was prepared using Dragon voice recognition software and may contain some unintended transcription errors.

## 2021-09-05 NOTE — ED Triage Notes (Signed)
Pt BIBA from home for [facility] x2-3 days. Hx COPD, emphysema. Trying home nebs with no relief. Wheezing and rhonchi all over. 20ga LH. 125 solumedrol, duoneb, albuterol neb en route.   83% RA in triage 170/80 HR 100 CBG 136

## 2021-09-06 DIAGNOSIS — Z7951 Long term (current) use of inhaled steroids: Secondary | ICD-10-CM | POA: Diagnosis not present

## 2021-09-06 DIAGNOSIS — E663 Overweight: Secondary | ICD-10-CM | POA: Diagnosis present

## 2021-09-06 DIAGNOSIS — Z888 Allergy status to other drugs, medicaments and biological substances status: Secondary | ICD-10-CM | POA: Diagnosis not present

## 2021-09-06 DIAGNOSIS — E785 Hyperlipidemia, unspecified: Secondary | ICD-10-CM

## 2021-09-06 DIAGNOSIS — Z79899 Other long term (current) drug therapy: Secondary | ICD-10-CM | POA: Diagnosis not present

## 2021-09-06 DIAGNOSIS — Z885 Allergy status to narcotic agent status: Secondary | ICD-10-CM | POA: Diagnosis not present

## 2021-09-06 DIAGNOSIS — J449 Chronic obstructive pulmonary disease, unspecified: Secondary | ICD-10-CM | POA: Diagnosis not present

## 2021-09-06 DIAGNOSIS — D72829 Elevated white blood cell count, unspecified: Secondary | ICD-10-CM | POA: Diagnosis not present

## 2021-09-06 DIAGNOSIS — E876 Hypokalemia: Secondary | ICD-10-CM | POA: Diagnosis present

## 2021-09-06 DIAGNOSIS — Z79890 Hormone replacement therapy (postmenopausal): Secondary | ICD-10-CM | POA: Diagnosis not present

## 2021-09-06 DIAGNOSIS — I10 Essential (primary) hypertension: Secondary | ICD-10-CM | POA: Diagnosis present

## 2021-09-06 DIAGNOSIS — F419 Anxiety disorder, unspecified: Secondary | ICD-10-CM | POA: Diagnosis present

## 2021-09-06 DIAGNOSIS — I700 Atherosclerosis of aorta: Secondary | ICD-10-CM | POA: Diagnosis present

## 2021-09-06 DIAGNOSIS — Z6826 Body mass index (BMI) 26.0-26.9, adult: Secondary | ICD-10-CM | POA: Diagnosis not present

## 2021-09-06 DIAGNOSIS — F32A Depression, unspecified: Secondary | ICD-10-CM | POA: Diagnosis present

## 2021-09-06 DIAGNOSIS — Z716 Tobacco abuse counseling: Secondary | ICD-10-CM | POA: Diagnosis not present

## 2021-09-06 DIAGNOSIS — J9621 Acute and chronic respiratory failure with hypoxia: Secondary | ICD-10-CM | POA: Diagnosis present

## 2021-09-06 DIAGNOSIS — M1990 Unspecified osteoarthritis, unspecified site: Secondary | ICD-10-CM | POA: Diagnosis present

## 2021-09-06 DIAGNOSIS — Z8249 Family history of ischemic heart disease and other diseases of the circulatory system: Secondary | ICD-10-CM | POA: Diagnosis not present

## 2021-09-06 DIAGNOSIS — J9601 Acute respiratory failure with hypoxia: Secondary | ICD-10-CM | POA: Diagnosis not present

## 2021-09-06 DIAGNOSIS — E039 Hypothyroidism, unspecified: Secondary | ICD-10-CM | POA: Diagnosis present

## 2021-09-06 DIAGNOSIS — M810 Age-related osteoporosis without current pathological fracture: Secondary | ICD-10-CM | POA: Diagnosis present

## 2021-09-06 DIAGNOSIS — J441 Chronic obstructive pulmonary disease with (acute) exacerbation: Secondary | ICD-10-CM | POA: Diagnosis present

## 2021-09-06 DIAGNOSIS — F17210 Nicotine dependence, cigarettes, uncomplicated: Secondary | ICD-10-CM | POA: Diagnosis present

## 2021-09-06 DIAGNOSIS — Z20822 Contact with and (suspected) exposure to covid-19: Secondary | ICD-10-CM | POA: Diagnosis present

## 2021-09-06 DIAGNOSIS — Z9981 Dependence on supplemental oxygen: Secondary | ICD-10-CM | POA: Diagnosis not present

## 2021-09-06 LAB — RESPIRATORY PANEL BY PCR

## 2021-09-06 LAB — CBC WITH DIFFERENTIAL/PLATELET
Abs Immature Granulocytes: 0.15 10*3/uL — ABNORMAL HIGH (ref 0.00–0.07)
Basophils Absolute: 0.1 10*3/uL (ref 0.0–0.1)
Basophils Relative: 0 %
Eosinophils Absolute: 0 10*3/uL (ref 0.0–0.5)
Eosinophils Relative: 0 %
HCT: 45.8 % (ref 36.0–46.0)
Hemoglobin: 14.9 g/dL (ref 12.0–15.0)
Immature Granulocytes: 1 %
Lymphocytes Relative: 11 %
Lymphs Abs: 2.3 10*3/uL (ref 0.7–4.0)
MCH: 30.8 pg (ref 26.0–34.0)
MCHC: 32.5 g/dL (ref 30.0–36.0)
MCV: 94.6 fL (ref 80.0–100.0)
Monocytes Absolute: 1.2 10*3/uL — ABNORMAL HIGH (ref 0.1–1.0)
Monocytes Relative: 6 %
Neutro Abs: 16.7 10*3/uL — ABNORMAL HIGH (ref 1.7–7.7)
Neutrophils Relative %: 82 %
Platelets: 320 10*3/uL (ref 150–400)
RBC: 4.84 MIL/uL (ref 3.87–5.11)
RDW: 13 % (ref 11.5–15.5)
WBC: 20.4 10*3/uL — ABNORMAL HIGH (ref 4.0–10.5)
nRBC: 0 % (ref 0.0–0.2)

## 2021-09-06 LAB — COMPREHENSIVE METABOLIC PANEL
ALT: 19 U/L (ref 0–44)
AST: 22 U/L (ref 15–41)
Albumin: 4.1 g/dL (ref 3.5–5.0)
Alkaline Phosphatase: 106 U/L (ref 38–126)
Anion gap: 8 (ref 5–15)
BUN: 19 mg/dL (ref 8–23)
CO2: 27 mmol/L (ref 22–32)
Calcium: 9.6 mg/dL (ref 8.9–10.3)
Chloride: 106 mmol/L (ref 98–111)
Creatinine, Ser: 0.91 mg/dL (ref 0.44–1.00)
GFR, Estimated: >60 mL/min (ref >60)
Glucose, Bld: 129 mg/dL — ABNORMAL HIGH (ref 70–99)
Potassium: 4.2 mmol/L (ref 3.5–5.1)
Sodium: 141 mmol/L (ref 135–145)
Total Bilirubin: 0.7 mg/dL (ref 0.3–1.2)
Total Protein: 7.5 g/dL (ref 6.5–8.1)

## 2021-09-06 LAB — MAGNESIUM
Magnesium: 2.4 mg/dL (ref 1.7–2.4)
Magnesium: 2.5 mg/dL — ABNORMAL HIGH (ref 1.7–2.4)

## 2021-09-06 LAB — PHOSPHORUS
Phosphorus: 3.2 mg/dL (ref 2.5–4.6)
Phosphorus: 3.3 mg/dL (ref 2.5–4.6)

## 2021-09-06 MED ORDER — PANTOPRAZOLE SODIUM 40 MG PO TBEC
40.0000 mg | DELAYED_RELEASE_TABLET | Freq: Every day | ORAL | Status: DC
  Administered 2021-09-06 – 2021-09-08 (×3): 40 mg via ORAL
  Filled 2021-09-06 (×3): qty 1

## 2021-09-06 MED ORDER — BUDESONIDE 0.25 MG/2ML IN SUSP
0.2500 mg | Freq: Two times a day (BID) | RESPIRATORY_TRACT | Status: DC
  Administered 2021-09-06 – 2021-09-08 (×5): 0.25 mg via RESPIRATORY_TRACT
  Filled 2021-09-06 (×5): qty 2

## 2021-09-06 MED ORDER — ARFORMOTEROL TARTRATE 15 MCG/2ML IN NEBU
15.0000 ug | INHALATION_SOLUTION | Freq: Two times a day (BID) | RESPIRATORY_TRACT | Status: DC
  Administered 2021-09-06 – 2021-09-08 (×5): 15 ug via RESPIRATORY_TRACT
  Filled 2021-09-06 (×6): qty 2

## 2021-09-06 MED ORDER — ORAL CARE MOUTH RINSE
15.0000 mL | Freq: Two times a day (BID) | OROMUCOSAL | Status: DC
  Administered 2021-09-06 – 2021-09-07 (×4): 15 mL via OROMUCOSAL

## 2021-09-06 MED ORDER — BENZONATATE 100 MG PO CAPS
100.0000 mg | ORAL_CAPSULE | Freq: Three times a day (TID) | ORAL | Status: DC | PRN
Start: 2021-09-06 — End: 2021-09-08
  Administered 2021-09-06 – 2021-09-08 (×5): 100 mg via ORAL
  Filled 2021-09-06 (×5): qty 1

## 2021-09-06 MED ORDER — METHYLPREDNISOLONE SODIUM SUCC 125 MG IJ SOLR
60.0000 mg | Freq: Two times a day (BID) | INTRAMUSCULAR | Status: DC
  Administered 2021-09-06 – 2021-09-08 (×5): 60 mg via INTRAVENOUS
  Filled 2021-09-06 (×5): qty 2

## 2021-09-06 MED ORDER — CHLORHEXIDINE GLUCONATE 0.12 % MT SOLN
15.0000 mL | Freq: Two times a day (BID) | OROMUCOSAL | Status: DC
  Administered 2021-09-06 – 2021-09-08 (×5): 15 mL via OROMUCOSAL
  Filled 2021-09-06 (×5): qty 15

## 2021-09-06 NOTE — TOC Progression Note (Signed)
Transition of Care [facility]) - Progression Note    Patient Details  Name: Gabrielle White MRN: [id number] Date of Birth: 10-Apr-1945  Transition of [facility]) CM/SW Contact  Purcell Mouton, RN Phone Number: 09/06/2021, 9:53 AM  Clinical Narrative:     Pt is from home alone. TOC will continue to follow for [facility] needs.  Expected Discharge Plan: Home/Self Care Barriers to Discharge: No Barriers Identified  Expected Discharge Plan and Services Expected Discharge Plan: Home/Self Care     Post Acute Care Choice: Home Health, [place] Living arrangements for the past 2 months: Single Family Home                                       Social Determinants of Health (SDOH) Interventions    Readmission Risk Interventions     View : No data to display.

## 2021-09-06 NOTE — Plan of Care (Signed)
  Problem: Education: Goal: Knowledge of disease or condition will improve Outcome: Progressing   Problem: Activity: Goal: Ability to tolerate increased activity will improve Outcome: Progressing   Problem: Respiratory: Goal: Levels of oxygenation will improve Outcome: Progressing   Problem: Education: Goal: Knowledge of General Education information will improve Description: Including pain rating scale, medication(s)/side effects and non-pharmacologic comfort measures Outcome: Progressing   Problem: Health Behavior/Discharge Planning: Goal: Ability to manage health-related needs will improve Outcome: Progressing

## 2021-09-06 NOTE — Progress Notes (Signed)
PROGRESS NOTE    Gabrielle White  INO:[id number] DOB: 1945/10/06 DOA: 09/05/2021 PCP: Orma Render, NP   Brief Narrative:  The patient is a 76 year old Caucasian female with a past medical history significant for but not limited to cataracts, depression anxiety, COPD and emphysema, history of ankle and tarsus enthesopathy, hypertension, hyperlipidemia, hypokalemia, leukocytosis, history migraine headaches, osteoporosis, osteoarthritis, history of pneumonia, history of [place] spotted fever, hypothyroidism as well as tobacco abuse who presented to the ED with gradually worsening dyspnea associated rhinorrhea, cough and wheezing for the past 2 to 3 days despite using multiple home nebulizer treatments.  She denies chest pain palpitations, chills or fevers PND or any pitting edema in the legs.  Because of her symptoms of shortness of breath she presented to the ED and was given 125 mg of Solu-Medrol and DuoNebs as well as albuterol nebs in route via the EMS and received another DuoNeb in the ED.  Initial lab work showed that she had a WBC of 10.5 and coronavirus PCR was negative.  Chest x-ray showed no acute cardiopulmonary disease.  She is admitted for acute on chronic respiratory failure with hypoxia in the setting of acute exacerbation of COPD   Assessment and Plan:  Acute on chronic respiratory failure with hypoxia in the setting of COPD exacerbation -Place in observation in the PCU but then will be changed to inpatient given persistent symptoms -Continuous pulse oximetry and maintain O2 saturation greater than 90%; wears oxygen at home -SpO2: 95 % O2 Flow Rate (L/min): 3 L/min -She has reported anxiety and restlessness with prednisone so she was started on methylprednisolone 40 mg IV daily and will increase to 60 mg twice daily given her continued wheezing -Continue with scheduled and as needed bronchodilator -We will add Brovana and budesonide as well as guaifenesin, flutter valve and  incentive spirometry -Check respiratory virus panel -She will need an ambulatory home O2 screen prior to discharge and repeat chest x-ray in the a.m.  Tobacco abuse and current cigarette smoker -Smoking cessation counseling given and she is declining nicotine replacement therapy  Hypothyroidism -Check TSH in the morning and continue with levothyroxine 150 mcg p.o. daily  Essential hypertension -Continue home antihypertensive with losartan 50 mg p.o. nightly and continue monitor blood pressure per protocol -Last blood pressure reading was 141/67  Atherosclerosis of the aorta -Continue with her home atorvastatin  Leukocytosis -Patient's WBC went from 10.5 is now 20.4 -In the setting of steroid demargination-continue monitor for signs of infection -Repeat CBC in a.m.   DVT prophylaxis: enoxaparin (LOVENOX) injection 40 mg Start: 09/05/21 2100    Code Status: Full Code Family Communication: No family currently at bedside  Disposition Plan:  Level of care: Progressive Status is: Observation The patient will require care spanning > 2 midnights and should be moved to inpatient because: She continues to be a little dyspneic and continues to have wheezing and a cough   Consultants:  None  Procedures:  None  Antimicrobials:  Anti-infectives (From admission, onward)    None       Subjective: Seen and examined at bedside patient still dyspneic and had some wheezing.  States she is doing a little bit better but still having significant cough.  Denies any chest pain but had some discomfort in her ribs from coughing.  No other concerns or complaints at this time.  Objective: Vitals:   09/06/21 0842 09/06/21 0956 09/06/21 1129 09/06/21 1255  BP:  (!) 143/77  (!) 141/67  Pulse:  96  89  Resp:  (!) 22  19  Temp:  97.6 F (36.4 C)  97.9 F (36.6 C)  TempSrc:  Oral  Oral  SpO2: 95% 97% 94% 95%  Weight:      Height:        Intake/Output Summary (Last 24 hours) at 09/06/2021  1455 Last data filed at 09/06/2021 0900 Gross per 24 hour  Intake 600 ml  Output --  Net 600 ml   Filed Weights   09/05/21 0932 09/05/21 2315  Weight: 72.6 kg 75.7 kg   Examination: Physical Exam:  Constitutional: WN/WD overweight Caucasian female currently no acute distress appears calm Respiratory: Diminished to auscultation bilaterally with coarse breath sounds and some wheezing but no appreciable rales, rhonchi or crackles. Normal respiratory effort and patient is not tachypenic. No accessory muscle use.  Unlabored breathing and is wearing supplemental oxygen nasal cannula Cardiovascular: RRR, no murmurs / rubs / gallops. S1 and S2 auscultated.  Mild lower extremity edema Abdomen: Soft, non-tender, distended second by habitus.. Bowel sounds positive.  GU: Deferred. Musculoskeletal: No clubbing / cyanosis of digits/nails. No joint deformity upper and lower extremities.   Neurologic: CN 2-12 grossly intact with no focal deficits. Romberg sign and cerebellar reflexes not assessed.  Psychiatric: Normal judgment and insight. Alert and oriented x 3. Normal mood and appropriate affect.   Data Reviewed: I have personally reviewed following labs and imaging studies  CBC: Recent Labs  Lab 09/05/21 1019 09/06/21 0856  WBC 10.5 20.4*  NEUTROABS 8.3* 16.7*  HGB 14.5 14.9  HCT 44.8 45.8  MCV 96.1 94.6  PLT 239 387   Basic Metabolic Panel: Recent Labs  Lab 09/05/21 1019 09/06/21 0420 09/06/21 0856  NA 143  --  141  K 4.0  --  4.2  CL 107  --  106  CO2 29  --  27  GLUCOSE 147*  --  129*  BUN 12  --  19  CREATININE 0.70  --  0.91  CALCIUM 8.9  --  9.6  MG  --  2.4 2.5*  PHOS  --  3.2 3.3   GFR: Estimated Creatinine Clearance: 53.6 mL/min (by C-G formula based on SCr of 0.91 mg/dL). Liver Function Tests: Recent Labs  Lab 09/06/21 0856  AST 22  ALT 19  ALKPHOS 106  BILITOT 0.7  PROT 7.5  ALBUMIN 4.1   No results for input(s): LIPASE, AMYLASE in the last 168  hours. No results for input(s): AMMONIA in the last 168 hours. Coagulation Profile: No results for input(s): INR, PROTIME in the last 168 hours. Cardiac Enzymes: No results for input(s): CKTOTAL, CKMB, CKMBINDEX, TROPONINI in the last 168 hours. BNP (last 3 results) No results for input(s): PROBNP in the last 8760 hours. HbA1C: No results for input(s): HGBA1C in the last 72 hours. CBG: No results for input(s): GLUCAP in the last 168 hours. Lipid Profile: No results for input(s): CHOL, HDL, LDLCALC, TRIG, CHOLHDL, LDLDIRECT in the last 72 hours. Thyroid Function Tests: No results for input(s): TSH, T4TOTAL, FREET4, T3FREE, THYROIDAB in the last 72 hours. Anemia Panel: No results for input(s): VITAMINB12, FOLATE, FERRITIN, TIBC, IRON, RETICCTPCT in the last 72 hours. Sepsis Labs: No results for input(s): PROCALCITON, LATICACIDVEN in the last 168 hours.  Recent Results (from the past 240 hour(s))  SARS Coronavirus 2 by RT PCR (hospital order, performed in [facility] hospital lab) *cepheid single result test* Anterior Nasal Swab     Status: None   Collection Time: 09/05/21  9:43 AM   Specimen: Anterior Nasal Swab  Result Value Ref Range Status   SARS Coronavirus 2 by RT PCR NEGATIVE NEGATIVE Final    Comment: (NOTE) SARS-CoV-2 target nucleic acids are NOT DETECTED.  The SARS-CoV-2 RNA is generally detectable in upper and lower respiratory specimens during the acute phase of infection. The lowest concentration of SARS-CoV-2 viral copies this assay can detect is 250 copies / mL. A negative result does not preclude SARS-CoV-2 infection and should not be used as the sole basis for treatment or other patient management decisions.  A negative result may occur with improper specimen collection / handling, submission of specimen other than nasopharyngeal swab, presence of viral mutation(s) within the areas targeted by this assay, and inadequate number of viral copies (<250 copies / mL). A  negative result must be combined with clinical observations, patient history, and epidemiological information.  Fact Sheet for Patients:   https://www.patel.info/  Fact Sheet for Healthcare Providers: https://hall.com/  This test is not yet approved or  cleared by the [place] FDA and has been authorized for detection and/or diagnosis of SARS-CoV-2 by FDA under an Emergency Use Authorization (EUA).  This EUA will remain in effect (meaning this test can be used) for the duration of the COVID-19 declaration under Section 564(b)(1) of the Act, 21 U.S.C. section 360bbb-3(b)(1), unless the authorization is terminated or revoked sooner.  Performed at [facility], [place] [address], [place], [place] [address]      Radiology Studies: DG Chest Portable 1 View  Result Date: 09/05/2021 CLINICAL DATA:  Shortness of breath. EXAM: PORTABLE CHEST 1 VIEW COMPARISON:  Radiograph September 26, 2019 FINDINGS: The heart size and mediastinal contours are within normal limits. No focal airspace consolidation. No visible pleural effusion or pneumothorax the visualized skeletal structures are unremarkable. IMPRESSION: No acute cardiopulmonary disease. Electronically Signed   By: Dahlia Bailiff M.D.   On: 09/05/2021 09:59     Scheduled Meds:  arformoterol  15 mcg Nebulization BID   atorvastatin  40 mg Oral QHS   budesonide (PULMICORT) nebulizer solution  0.25 mg Nebulization BID   chlorhexidine  15 mL Mouth Rinse BID   enoxaparin (LOVENOX) injection  40 mg Subcutaneous Q24H   guaiFENesin  1,200 mg Oral BID   ipratropium-albuterol  3 mL Nebulization QID   levothyroxine  125 mcg Oral Q0600   losartan  50 mg Oral QHS   mouth rinse  15 mL Mouth Rinse q12n4p   methylPREDNISolone (SOLU-MEDROL) injection  60 mg Intravenous Q12H   pantoprazole  40 mg Oral Daily   Continuous Infusions:   LOS: 0 days   Raiford Noble, DO [facility] Available via Epic secure chat 7am-7pm After these hours, please refer to coverage provider listed on [email or web address] 09/06/2021, 2:55 PM

## 2021-09-06 NOTE — Progress Notes (Signed)
Patient is concerned about home inhalers that she usually takes but not scheduled while patient is in the hospital. Nurse passed along to dayshift to talk to doctor.

## 2021-09-06 NOTE — Plan of Care (Signed)
  Problem: Education: Goal: Knowledge of disease or condition will improve Outcome: Progressing   Problem: Activity: Goal: Ability to tolerate increased activity will improve Outcome: Progressing   Problem: Respiratory: Goal: Ability to maintain a clear airway will improve Outcome: Progressing   Problem: Education: Goal: Knowledge of General Education information will improve Description: Including pain rating scale, medication(s)/side effects and non-pharmacologic comfort measures Outcome: Progressing   Problem: Activity: Goal: Risk for activity intolerance will decrease Outcome: Progressing   Problem: Coping: Goal: Level of anxiety will decrease Outcome: Progressing   Problem: Elimination: Goal: Will not experience complications related to bowel motility Outcome: Progressing Goal: Will not experience complications related to urinary retention Outcome: Progressing   Problem: Pain Managment: Goal: General experience of comfort will improve Outcome: Progressing   Problem: Safety: Goal: Ability to remain free from injury will improve Outcome: Progressing   Problem: Skin Integrity: Goal: Risk for impaired skin integrity will decrease Outcome: Progressing

## 2021-09-06 NOTE — Progress Notes (Signed)
PT demonstrated hands on understanding of Flutter device- NPC at this time. 

## 2021-09-07 ENCOUNTER — Inpatient Hospital Stay (HOSPITAL_COMMUNITY): Payer: Medicare HMO

## 2021-09-07 ENCOUNTER — Ambulatory Visit: Payer: Medicare HMO | Admitting: Psychologist

## 2021-09-07 DIAGNOSIS — J9601 Acute respiratory failure with hypoxia: Secondary | ICD-10-CM | POA: Diagnosis not present

## 2021-09-07 LAB — CBC WITH DIFFERENTIAL/PLATELET
Abs Immature Granulocytes: 0.13 10*3/uL — ABNORMAL HIGH (ref 0.00–0.07)
Basophils Absolute: 0 10*3/uL (ref 0.0–0.1)
Basophils Relative: 0 %
Eosinophils Absolute: 0 10*3/uL (ref 0.0–0.5)
Eosinophils Relative: 0 %
HCT: 44.7 % (ref 36.0–46.0)
Hemoglobin: 14.5 g/dL (ref 12.0–15.0)
Immature Granulocytes: 1 %
Lymphocytes Relative: 5 %
Lymphs Abs: 1 10*3/uL (ref 0.7–4.0)
MCH: 31 pg (ref 26.0–34.0)
MCHC: 32.4 g/dL (ref 30.0–36.0)
MCV: 95.5 fL (ref 80.0–100.0)
Monocytes Absolute: 0.2 10*3/uL (ref 0.1–1.0)
Monocytes Relative: 1 %
Neutro Abs: 16.8 10*3/uL — ABNORMAL HIGH (ref 1.7–7.7)
Neutrophils Relative %: 93 %
Platelets: 273 10*3/uL (ref 150–400)
RBC: 4.68 MIL/uL (ref 3.87–5.11)
RDW: 13.3 % (ref 11.5–15.5)
WBC: 18.1 10*3/uL — ABNORMAL HIGH (ref 4.0–10.5)
nRBC: 0 % (ref 0.0–0.2)

## 2021-09-07 LAB — COMPREHENSIVE METABOLIC PANEL
ALT: 21 U/L (ref 0–44)
AST: 21 U/L (ref 15–41)
Albumin: 3.9 g/dL (ref 3.5–5.0)
Alkaline Phosphatase: 94 U/L (ref 38–126)
Anion gap: 7 (ref 5–15)
BUN: 24 mg/dL — ABNORMAL HIGH (ref 8–23)
CO2: 26 mmol/L (ref 22–32)
Calcium: 9.5 mg/dL (ref 8.9–10.3)
Chloride: 107 mmol/L (ref 98–111)
Creatinine, Ser: 0.74 mg/dL (ref 0.44–1.00)
GFR, Estimated: >60 mL/min (ref >60)
Glucose, Bld: 169 mg/dL — ABNORMAL HIGH (ref 70–99)
Potassium: 4.1 mmol/L (ref 3.5–5.1)
Sodium: 140 mmol/L (ref 135–145)
Total Bilirubin: 0.5 mg/dL (ref 0.3–1.2)
Total Protein: 7 g/dL (ref 6.5–8.1)

## 2021-09-07 LAB — MAGNESIUM: Magnesium: 2.6 mg/dL — ABNORMAL HIGH (ref 1.7–2.4)

## 2021-09-07 LAB — PHOSPHORUS: Phosphorus: 2.6 mg/dL (ref 2.5–4.6)

## 2021-09-07 NOTE — Plan of Care (Signed)

## 2021-09-07 NOTE — Plan of Care (Signed)
?  Problem: Education: ?Goal: Knowledge of disease or condition will improve ?Outcome: Progressing ?  ?Problem: Activity: ?Goal: Ability to tolerate increased activity will improve ?Outcome: Progressing ?  ?Problem: Respiratory: ?Goal: Ability to maintain a clear airway will improve ?Outcome: Progressing ?Goal: Levels of oxygenation will improve ?Outcome: Progressing ?  ?

## 2021-09-07 NOTE — TOC Transition Note (Addendum)
Transition of Care [facility]) - CM/SW Discharge Note   Patient Details  Name: Gabrielle White MRN: [id number] Date of Birth: July 31, 1945  [facility]) CM/SW Contact:  Leeroy Cha, RN Phone Number: 09/07/2021, 10:54 AM   Clinical Narrative:    Dme nebulizer ordered through [employer] Travel 02 ordered through home provider [facility].  Final next level of care: Home/Self Care Barriers to Discharge: No Barriers Identified   Patient Goals and CMS Choice Patient states their goals for this hospitalization and ongoing recovery are:: To get better CMS Medicare.gov Compare Post Acute Care list provided to:: Patient Choice offered to / list presented to : Patient  Discharge Placement                       Discharge Plan and Services     Post Acute Care Choice: Durable Medical Equipment          DME Arranged: Nebulizer machine DME Agency: [employer] Date DME Agency Contacted: 09/07/21 Time DME Agency Contacted: [phone] Representative spoke with at DME Agency: danielle            Social Determinants of Health ([place]) Interventions     Readmission Risk Interventions     View : No data to display.

## 2021-09-07 NOTE — Progress Notes (Signed)
PROGRESS NOTE  Gabrielle White OMV:[id number] DOB: [birth year] DOA: 09/05/2021 PCP: Orma Render, NP   LOS: 1 day   Brief Narrative / Interim history: 76 yo F with COPD/emphysema, ongoing tobacco use, hypertension, hyperlipidemia, migraine headaches, comes to the hospital with dyspnea, shortness of breath, cough and wheezing for the past 2 to 3 days.  She was diagnosed with COPD exacerbation and admitted to the hospital.  Subjective / 24h Interval events: Complains of a persistent cough, persistent wheezing and shortness of breath  Assesement and Plan: Principal Problem:   Acute respiratory failure with hypoxia (HCC) Active Problems:   Cigarette smoker   COPD GOLD II   Hyperlipidemia   Hypothyroidism   Essential hypertension   Atherosclerosis of aorta (HCC)   Chronic respiratory failure with hypoxia (HCC)   Principal problem Acute on chronic hypoxic respiratory failure due to COPD exacerbation-currently on 3 L nasal cannula which is close to her home oxygen levels.  She was started on steroids, continue.  Continue nebulizers and supportive care  Active problems Tobacco use-counseled for cessation  Hypothyroidism-continue Synthroid  Hypertension-continue losartan  Hyperlipidemia, aortic atherosclerosis-continue statin  Leukocytosis-due to steroids  Scheduled Meds:  arformoterol  15 mcg Nebulization BID   atorvastatin  40 mg Oral QHS   budesonide (PULMICORT) nebulizer solution  0.25 mg Nebulization BID   chlorhexidine  15 mL Mouth Rinse BID   enoxaparin (LOVENOX) injection  40 mg Subcutaneous Q24H   guaiFENesin  1,200 mg Oral BID   ipratropium-albuterol  3 mL Nebulization QID   levothyroxine  125 mcg Oral Q0600   losartan  50 mg Oral QHS   mouth rinse  15 mL Mouth Rinse q12n4p   methylPREDNISolone (SOLU-MEDROL) injection  60 mg Intravenous Q12H   pantoprazole  40 mg Oral Daily   Continuous Infusions: PRN Meds:.acetaminophen **OR** acetaminophen, albuterol,  ALPRAZolam, benzonatate, ondansetron **OR** ondansetron (ZOFRAN) IV  Diet Orders (From admission, onward)     Start     Ordered   09/05/21 1054  Diet heart healthy/carb modified Room service appropriate? Yes; Fluid consistency: Thin  Diet effective now       Question Answer Comment  Diet-HS Snack? Nothing   Room service appropriate? Yes   Fluid consistency: Thin      09/05/21 1054            DVT prophylaxis: enoxaparin (LOVENOX) injection 40 mg Start: 09/05/21 2100   Lab Results  Component Value Date   PLT 273 09/07/2021      Code Status: Full Code  Family Communication: no family at bedside   Status is: Inpatient  Remains inpatient appropriate because: severity of illness  Level of care: Progressive  Consultants:  none   Objective: Vitals:   09/07/21 0807 09/07/21 1120 09/07/21 1126 09/07/21 1209  BP: (!) 151/73   121/85  Pulse: 98   93  Resp: '18  18 20  '$ Temp:      TempSrc:      SpO2: 99% 91% 96% 92%  Weight:      Height:        Intake/Output Summary (Last 24 hours) at 09/07/2021 1248 Last data filed at 09/07/2021 0740 Gross per 24 hour  Intake 720 ml  Output --  Net 720 ml   Wt Readings from Last 3 Encounters:  09/05/21 75.7 kg  07/28/21 75 kg  07/14/21 74.6 kg    Examination:  Constitutional: NAD Eyes: no scleral icterus ENMT: Mucous membranes are moist.  Neck: normal, supple Respiratory: clear  to auscultation bilaterally, no wheezing, no crackles. Normal respiratory effort.  Cardiovascular: Regular rate and rhythm, no murmurs / rubs / gallops. No LE edema.  Abdomen: non distended, no tenderness. Bowel sounds positive.  Musculoskeletal: no clubbing / cyanosis.  Skin: no rashes Neurologic: non focal   Data Reviewed: I have independently reviewed following labs and imaging studies   CBC Recent Labs  Lab 09/05/21 1019 09/06/21 0856 09/07/21 0346  WBC 10.5 20.4* 18.1*  HGB 14.5 14.9 14.5  HCT 44.8 45.8 44.7  PLT 239 320 273  MCV  96.1 94.6 95.5  MCH 31.1 30.8 31.0  MCHC 32.4 32.5 32.4  RDW 13.0 13.0 13.3  LYMPHSABS 1.5 2.3 1.0  MONOABS 0.4 1.2* 0.2  EOSABS 0.3 0.0 0.0  BASOSABS 0.1 0.1 0.0    Recent Labs  Lab 09/05/21 1019 09/06/21 0420 09/06/21 0856 09/07/21 0346  NA 143  --  141 140  K 4.0  --  4.2 4.1  CL 107  --  106 107  CO2 29  --  27 26  GLUCOSE 147*  --  129* 169*  BUN 12  --  19 24*  CREATININE 0.70  --  0.91 0.74  CALCIUM 8.9  --  9.6 9.5  AST  --   --  22 21  ALT  --   --  19 21  ALKPHOS  --   --  106 94  BILITOT  --   --  0.7 0.5  ALBUMIN  --   --  4.1 3.9  MG  --  2.4 2.5* 2.6*    ------------------------------------------------------------------------------------------------------------------ No results for input(s): CHOL, HDL, LDLCALC, TRIG, CHOLHDL, LDLDIRECT in the last 72 hours.  Lab Results  Component Value Date   HGBA1C 5.7 (H) 07/04/2021   ------------------------------------------------------------------------------------------------------------------ No results for input(s): TSH, T4TOTAL, T3FREE, THYROIDAB in the last 72 hours.  Invalid input(s): FREET3  Cardiac Enzymes No results for input(s): CKMB, TROPONINI, MYOGLOBIN in the last 168 hours.  Invalid input(s): CK ------------------------------------------------------------------------------------------------------------------ No results found for: BNP  CBG: No results for input(s): GLUCAP in the last 168 hours.  Recent Results (from the past 240 hour(s))  SARS Coronavirus 2 by RT PCR (hospital order, performed in [facility] hospital lab) *cepheid single result test* Anterior Nasal Swab     Status: None   Collection Time: 09/05/21  9:43 AM   Specimen: Anterior Nasal Swab  Result Value Ref Range Status   SARS Coronavirus 2 by RT PCR NEGATIVE NEGATIVE Final    Comment: (NOTE) SARS-CoV-2 target nucleic acids are NOT DETECTED.  The SARS-CoV-2 RNA is generally detectable in upper and lower respiratory  specimens during the acute phase of infection. The lowest concentration of SARS-CoV-2 viral copies this assay can detect is 250 copies / mL. A negative result does not preclude SARS-CoV-2 infection and should not be used as the sole basis for treatment or other patient management decisions.  A negative result may occur with improper specimen collection / handling, submission of specimen other than nasopharyngeal swab, presence of viral mutation(s) within the areas targeted by this assay, and inadequate number of viral copies (<250 copies / mL). A negative result must be combined with clinical observations, patient history, and epidemiological information.  Fact Sheet for Patients:   https://www.patel.info/  Fact Sheet for Healthcare Providers: https://hall.com/  This test is not yet approved or  cleared by the [place] FDA and has been authorized for detection and/or diagnosis of SARS-CoV-2 by FDA under an Emergency Use Authorization (  EUA).  This EUA will remain in effect (meaning this test can be used) for the duration of the COVID-19 declaration under Section 564(b)(1) of the Act, 21 U.S.C. section 360bbb-3(b)(1), unless the authorization is terminated or revoked sooner.  Performed at [facility], [place] [address] [place], [place] [address]   Respiratory (~20 pathogens) panel by PCR     Status: None   Collection Time: 09/06/21 12:27 PM   Specimen: Nasopharyngeal Swab; Respiratory  Result Value Ref Range Status   Adenovirus NOT DETECTED NOT DETECTED Final   Coronavirus 229E NOT DETECTED NOT DETECTED Final    Comment: (NOTE) The Coronavirus on the Respiratory Panel, DOES NOT test for the novel  Coronavirus (2019 nCoV)    Coronavirus HKU1 NOT DETECTED NOT DETECTED Final   Coronavirus NL63 NOT DETECTED NOT DETECTED Final   Coronavirus OC43 NOT DETECTED NOT DETECTED Final   Metapneumovirus NOT DETECTED NOT  DETECTED Final   Rhinovirus / Enterovirus NOT DETECTED NOT DETECTED Final   Influenza A NOT DETECTED NOT DETECTED Final   Influenza B NOT DETECTED NOT DETECTED Final   Parainfluenza Virus 1 NOT DETECTED NOT DETECTED Final   Parainfluenza Virus 2 NOT DETECTED NOT DETECTED Final   Parainfluenza Virus 3 NOT DETECTED NOT DETECTED Final   Parainfluenza Virus 4 NOT DETECTED NOT DETECTED Final   Respiratory Syncytial Virus NOT DETECTED NOT DETECTED Final   Bordetella pertussis NOT DETECTED NOT DETECTED Final   Bordetella Parapertussis NOT DETECTED NOT DETECTED Final   Chlamydophila pneumoniae NOT DETECTED NOT DETECTED Final   Mycoplasma pneumoniae NOT DETECTED NOT DETECTED Final    Comment: Performed at [facility], [place]. [address] [place], [place] [address]     Radiology Studies: DG CHEST PORT 1 VIEW  Result Date: 09/07/2021 CLINICAL DATA:  Shortness of breath EXAM: PORTABLE CHEST 1 VIEW COMPARISON:  September 05, 2021 FINDINGS: The heart size and mediastinal contours are within normal limits. Both lungs are clear. The visualized skeletal structures are stable. IMPRESSION: No active disease. Electronically Signed   By: Abelardo Diesel M.D.   On: 09/07/2021 07:36     Marzetta Board, MD, PhD [facility]  Between 7 am - 7 pm I am available, please contact me via Amion (for emergencies) or Securechat (non urgent messages)  Between 7 pm - 7 am I am not available, please contact night coverage MD/APP via Amion

## 2021-09-08 DIAGNOSIS — J9601 Acute respiratory failure with hypoxia: Secondary | ICD-10-CM | POA: Diagnosis not present

## 2021-09-08 MED ORDER — BENZONATATE 100 MG PO CAPS: 200.0000 mg | ORAL_CAPSULE | Freq: Three times a day (TID) | ORAL | 0 refills | Status: AC | PRN

## 2021-09-08 MED ORDER — PREDNISONE 10 MG PO TABS: ORAL_TABLET | ORAL | 0 refills | Status: AC

## 2021-09-08 MED ORDER — IPRATROPIUM-ALBUTEROL 0.5-2.5 (3) MG/3ML IN SOLN: 3.0000 mL | Freq: Four times a day (QID) | RESPIRATORY_TRACT | 1 refills | Status: AC | PRN

## 2021-09-08 MED ORDER — GUAIFENESIN ER 600 MG PO TB12: 1200.0000 mg | ORAL_TABLET | Freq: Two times a day (BID) | ORAL | 0 refills | Status: AC

## 2021-09-08 NOTE — Discharge Summary (Signed)
Physician Discharge Summary  Gabrielle White YQM:[id number] DOB: 08/15/1945 DOA: 09/05/2021  PCP: Orma Render, NP  Admit date: 09/05/2021 Discharge date: 09/08/2021  Admitted From: home Disposition:  home  Recommendations for Outpatient Follow-up:  Follow up with PCP in 1-2 weeks Please obtain BMP/CBC in one week  Home Health: none Equipment/Devices: home O2  Discharge Condition: stable CODE STATUS: Full code Diet Orders (From admission, onward)     Start     Ordered   09/05/21 1054  Diet heart healthy/carb modified Room service appropriate? Yes; Fluid consistency: Thin  Diet effective now       Question Answer Comment  Diet-HS Snack? Nothing   Room service appropriate? Yes   Fluid consistency: Thin      09/05/21 1054            HPI: Per admitting MD, Gabrielle White is a 76 y.o. female with medical history significant of cataract, depression, anxiety, COPD/emphysema, ankle and tarsus enthesopathy, hypertension, hyperlipidemia, hypokalemia, leukocytosis, migraine headaches, osteoarthrosis, osteoporosis, history of pneumonia, history of [place] spotted fever, hypothyroidism, tobacco abuse who is coming to the emergency department due to progressively worse dyspnea associated with rhinorrhea, cough and wheezing for the past 2 to 3 days despite using multiple home nebulizer treatments. He denied fever, chills,sore throat or hemoptysis.  No chest pain, palpitations, diaphoresis, PND, orthopnea or recent pitting edema of the lower extremities.  No abdominal pain, nausea, emesis, diarrhea, constipation, melena or hematochezia.  No flank pain, dysuria, frequency or hematuria.  No polyuria, polydipsia, polyphagia or blurred vision.   Hospital Course / Discharge diagnoses: Principal Problem:   Acute respiratory failure with hypoxia (HCC) Active Problems:   Cigarette smoker   COPD GOLD II   Hyperlipidemia   Hypothyroidism   Essential hypertension   Atherosclerosis of aorta  (HCC)   Chronic respiratory failure with hypoxia (HCC)   Principal problem Acute on chronic hypoxic respiratory failure due to COPD exacerbation-patient was admitted to the hospital with worsening respiratory failure in the setting of COPD exacerbation.  She was placed on nebulizers, steroids, and eventually improved and returned to 3 L nasal cannula which she is her baseline.  She feels like her home rescue inhaler is not working very well, and she was set up with a nebulizer machine.  Overall she is improved, asking to go home, and will be discharged in stable condition  Active problems Tobacco use-counseled for cessation Hypothyroidism-continue Synthroid Hypertension-continue home regimen Hyperlipidemia, aortic atherosclerosis-continue statin Leukocytosis-due to steroids  Sepsis ruled out   Discharge Instructions   Allergies as of 09/08/2021       Reactions   Cafergot Nausea And Vomiting   Codeine Nausea And Vomiting   Fenoprofen Calcium Nausea And Vomiting        Medication List     STOP taking these medications    Breztri Aerosphere 160-9-4.8 MCG/ACT Aero Generic drug: Budeson-Glycopyrrol-Formoterol   hydrOXYzine 25 MG capsule Commonly known as: VISTARIL       TAKE these medications    albuterol 108 (90 Base) MCG/ACT inhaler Commonly known as: VENTOLIN HFA Inhale 2 puffs into the lungs every 6 (six) hours as needed for wheezing or shortness of breath. What changed:  how much to take when to take this additional instructions   ALPRAZolam 0.5 MG tablet Commonly known as: XANAX TAKE 1 TABLET(0.5 MG) BY MOUTH AT BEDTIME AS NEEDED FOR ANXIETY What changed: See the new instructions.   AMBULATORY NON FORMULARY MEDICATION Pulse oximeter for  monitoring oxygen saturation. Dx: COPD   atorvastatin 40 MG tablet Commonly known as: LIPITOR TAKE 1 TABLET(40 MG) BY MOUTH DAILY What changed: See the new instructions.   benzonatate 100 MG capsule Commonly known as:  Tessalon Perles Take 2 capsules (200 mg total) by mouth 3 (three) times daily as needed for cough.   guaiFENesin 600 MG 12 hr tablet Commonly known as: Mucinex Take 2 tablets (1,200 mg total) by mouth 2 (two) times daily for 10 days.   ipratropium-albuterol 0.5-2.5 (3) MG/3ML Soln Commonly known as: DUONEB Take 3 mLs by nebulization every 6 (six) hours as needed.   levothyroxine 125 MCG tablet Commonly known as: SYNTHROID TAKE 1 TABLET(125 MCG) BY MOUTH DAILY BEFORE BREAKFAST What changed: See the new instructions.   losartan 50 MG tablet Commonly known as: COZAAR TAKE 1 TABLET(50 MG) BY MOUTH DAILY What changed: See the new instructions.   OVER THE COUNTER MEDICATION Place 1-2 drops into both eyes daily as needed (dry eye). OTC eye drop   predniSONE 10 MG tablet Commonly known as: DELTASONE Take 4 tablets (40 mg total) by mouth daily for 3 days, THEN 3 tablets (30 mg total) daily for 3 days, THEN 2 tablets (20 mg total) daily for 3 days, THEN 1 tablet (10 mg total) daily for 3 days. Start taking on: September 08, 2021   Trelegy Ellipta 100-62.5-25 MCG/ACT Aepb Generic drug: Fluticasone-Umeclidin-Vilant Inhale 1 Dose into the lungs daily. For COPD management. Stop Breztri when using this. What changed:  how much to take additional instructions   Vitamin D3 25 MCG (1000 UT) Caps Take 1,000 Units by mouth daily.               Durable Medical Equipment  (From admission, onward)           Start     Ordered   09/07/21 1241  For home use only DME oxygen  Once       Question Answer Comment  Length of Need Lifetime   Mode or (Route) Nasal cannula   Liters per Minute 3   Frequency Continuous (stationary and portable oxygen unit needed)   Oxygen delivery system Gas      09/07/21 1241   09/07/21 0910  For home use only DME Nebulizer machine  Once       Question Answer Comment  Patient needs a nebulizer to treat with the following condition COPD (chronic obstructive  pulmonary disease) ([place])   Length of Need Lifetime      09/07/21 0910             Consultations: none  Procedures/Studies:  DG CHEST PORT 1 VIEW  Result Date: 09/07/2021 CLINICAL DATA:  Shortness of breath EXAM: PORTABLE CHEST 1 VIEW COMPARISON:  September 05, 2021 FINDINGS: The heart size and mediastinal contours are within normal limits. Both lungs are clear. The visualized skeletal structures are stable. IMPRESSION: No active disease. Electronically Signed   By: Abelardo Diesel M.D.   On: 09/07/2021 07:36   DG Chest Portable 1 View  Result Date: 09/05/2021 CLINICAL DATA:  Shortness of breath. EXAM: PORTABLE CHEST 1 VIEW COMPARISON:  Radiograph September 26, 2019 FINDINGS: The heart size and mediastinal contours are within normal limits. No focal airspace consolidation. No visible pleural effusion or pneumothorax the visualized skeletal structures are unremarkable. IMPRESSION: No acute cardiopulmonary disease. Electronically Signed   By: Dahlia Bailiff M.D.   On: 09/05/2021 09:59     Subjective: - no chest pain, shortness of  breath, no abdominal pain, nausea or vomiting.   Discharge Exam: BP (!) 156/89 (BP Location: Left Arm)   Pulse (!) 102   Temp 98.2 F (36.8 C) (Oral)   Resp 20   Ht '5\' 5"'$  (1.651 m)   Wt 75.7 kg   SpO2 93%   BMI 27.77 kg/m   General: Pt is alert, awake, not in acute distress Cardiovascular: RRR, S1/S2 +, no rubs, no gallops Respiratory: faint end expiratory wheezing Abdominal: Soft, NT, ND, bowel sounds + Extremities: no edema, no cyanosis   The results of significant diagnostics from this hospitalization (including imaging, microbiology, ancillary and laboratory) are listed below for reference.     Microbiology: Recent Results (from the past 240 hour(s))  SARS Coronavirus 2 by RT PCR (hospital order, performed in [facility] hospital lab) *cepheid single result test* Anterior Nasal Swab     Status: None   Collection Time: 09/05/21  9:43 AM   Specimen:  Anterior Nasal Swab  Result Value Ref Range Status   SARS Coronavirus 2 by RT PCR NEGATIVE NEGATIVE Final    Comment: (NOTE) SARS-CoV-2 target nucleic acids are NOT DETECTED.  The SARS-CoV-2 RNA is generally detectable in upper and lower respiratory specimens during the acute phase of infection. The lowest concentration of SARS-CoV-2 viral copies this assay can detect is 250 copies / mL. A negative result does not preclude SARS-CoV-2 infection and should not be used as the sole basis for treatment or other patient management decisions.  A negative result may occur with improper specimen collection / handling, submission of specimen other than nasopharyngeal swab, presence of viral mutation(s) within the areas targeted by this assay, and inadequate number of viral copies (<250 copies / mL). A negative result must be combined with clinical observations, patient history, and epidemiological information.  Fact Sheet for Patients:   https://www.patel.info/  Fact Sheet for Healthcare Providers: https://hall.com/  This test is not yet approved or  cleared by the [place] FDA and has been authorized for detection and/or diagnosis of SARS-CoV-2 by FDA under an Emergency Use Authorization (EUA).  This EUA will remain in effect (meaning this test can be used) for the duration of the COVID-19 declaration under Section 564(b)(1) of the Act, 21 U.S.C. section 360bbb-3(b)(1), unless the authorization is terminated or revoked sooner.  Performed at [facility], [place] [address] [place], [place] [address]   Respiratory (~20 pathogens) panel by PCR     Status: None   Collection Time: 09/06/21 12:27 PM   Specimen: Nasopharyngeal Swab; Respiratory  Result Value Ref Range Status   Adenovirus NOT DETECTED NOT DETECTED Final   Coronavirus 229E NOT DETECTED NOT DETECTED Final    Comment: (NOTE) The Coronavirus on the Respiratory  Panel, DOES NOT test for the novel  Coronavirus (2019 nCoV)    Coronavirus HKU1 NOT DETECTED NOT DETECTED Final   Coronavirus NL63 NOT DETECTED NOT DETECTED Final   Coronavirus OC43 NOT DETECTED NOT DETECTED Final   Metapneumovirus NOT DETECTED NOT DETECTED Final   Rhinovirus / Enterovirus NOT DETECTED NOT DETECTED Final   Influenza A NOT DETECTED NOT DETECTED Final   Influenza B NOT DETECTED NOT DETECTED Final   Parainfluenza Virus 1 NOT DETECTED NOT DETECTED Final   Parainfluenza Virus 2 NOT DETECTED NOT DETECTED Final   Parainfluenza Virus 3 NOT DETECTED NOT DETECTED Final   Parainfluenza Virus 4 NOT DETECTED NOT DETECTED Final   Respiratory Syncytial Virus NOT DETECTED NOT DETECTED Final   Bordetella pertussis  NOT DETECTED NOT DETECTED Final   Bordetella Parapertussis NOT DETECTED NOT DETECTED Final   Chlamydophila pneumoniae NOT DETECTED NOT DETECTED Final   Mycoplasma pneumoniae NOT DETECTED NOT DETECTED Final    Comment: Performed at [facility], [place] [address], [place], [place] [address]     Labs: Basic Metabolic Panel: Recent Labs  Lab 09/05/21 1019 09/06/21 0420 09/06/21 0856 09/07/21 0346  NA 143  --  141 140  K 4.0  --  4.2 4.1  CL 107  --  106 107  CO2 29  --  27 26  GLUCOSE 147*  --  129* 169*  BUN 12  --  19 24*  CREATININE 0.70  --  0.91 0.74  CALCIUM 8.9  --  9.6 9.5  MG  --  2.4 2.5* 2.6*  PHOS  --  3.2 3.3 2.6   Liver Function Tests: Recent Labs  Lab 09/06/21 0856 09/07/21 0346  AST 22 21  ALT 19 21  ALKPHOS 106 94  BILITOT 0.7 0.5  PROT 7.5 7.0  ALBUMIN 4.1 3.9   CBC: Recent Labs  Lab 09/05/21 1019 09/06/21 0856 09/07/21 0346  WBC 10.5 20.4* 18.1*  NEUTROABS 8.3* 16.7* 16.8*  HGB 14.5 14.9 14.5  HCT 44.8 45.8 44.7  MCV 96.1 94.6 95.5  PLT 239 320 273   CBG: No results for input(s): "GLUCAP" in the last 168 hours. Hgb A1c No results for input(s): "HGBA1C" in the last 72 hours. Lipid Profile No results for input(s):  "CHOL", "HDL", "LDLCALC", "TRIG", "CHOLHDL", "LDLDIRECT" in the last 72 hours. Thyroid function studies No results for input(s): "TSH", "T4TOTAL", "T3FREE", "THYROIDAB" in the last 72 hours.  Invalid input(s): "FREET3" Urinalysis    Component Value Date/Time   COLORURINE Lt. Yellow 07/14/2020 1625   APPEARANCEUR Cloudy (A) 07/14/2020 1625   APPEARANCEUR Clear 10/06/2015 1552   LABSPEC 1.020 07/14/2020 1625   PHURINE 6.0 07/14/2020 1625   GLUCOSEU NEGATIVE 07/14/2020 1625   HGBUR SMALL (A) 07/14/2020 1625   BILIRUBINUR Negative 03/09/2021 1310   BILIRUBINUR Negative 10/06/2015 1552   KETONESUR NEGATIVE 07/14/2020 1625   PROTEINUR Negative 03/09/2021 1310   PROTEINUR NEGATIVE 06/09/2019 1903   UROBILINOGEN 0.2 03/09/2021 1310   UROBILINOGEN 0.2 07/14/2020 1625   NITRITE Negative 03/09/2021 1310   NITRITE NEGATIVE 07/14/2020 1625   LEUKOCYTESUR Moderate (2+) (A) 03/09/2021 1310   LEUKOCYTESUR LARGE (A) 07/14/2020 1625    FURTHER DISCHARGE INSTRUCTIONS:   Get Medicines reviewed and adjusted: Please take all your medications with you for your next visit with your Primary MD   Laboratory/radiological data: Please request your Primary MD to go over all hospital tests and procedure/radiological results at the follow up, please ask your Primary MD to get all Hospital records sent to his/her office.   In some cases, they will be blood work, cultures and biopsy results pending at the time of your discharge. Please request that your primary care M.D. goes through all the records of your hospital data and follows up on these results.   Also Note the following: If you experience worsening of your admission symptoms, develop shortness of breath, life threatening emergency, suicidal or homicidal thoughts you must seek medical attention immediately by calling 911 or calling your MD immediately  if symptoms less severe.   You must read complete instructions/literature along with all the  possible adverse reactions/side effects for all the Medicines you take and that have been prescribed to you. Take any new Medicines after you have  completely understood and accpet all the possible adverse reactions/side effects.    Do not drive when taking Pain medications or sleeping medications (Benzodaizepines)   Do not take more than prescribed Pain, Sleep and Anxiety Medications. It is not advisable to combine anxiety,sleep and pain medications without talking with your primary care practitioner   Special Instructions: If you have smoked or chewed Tobacco  in the last 2 yrs please stop smoking, stop any regular Alcohol  and or any Recreational drug use.   Wear Seat belts while driving.   Please note: You were cared for by a hospitalist during your hospital stay. Once you are discharged, your primary care physician will handle any further medical issues. Please note that NO REFILLS for any discharge medications will be authorized once you are discharged, as it is imperative that you return to your primary care physician (or establish a relationship with a primary care physician if you do not have one) for your post hospital discharge needs so that they can reassess your need for medications and monitor your lab values.  Time coordinating discharge: 40 minutes  SIGNED:  Marzetta Board, MD, PhD 09/08/2021, 7:26 AM

## 2021-09-08 NOTE — Plan of Care (Signed)

## 2021-09-08 NOTE — TOC Transition Note (Signed)
Transition of Care [facility]) - CM/SW Discharge Note   Patient Details  Name: Gabrielle White MRN: [id number] Date of Birth: 12/07/1945  Transition of Care [facility]) CM/SW Contact:  Leeroy Cha, RN Phone Number: 09/08/2021, 8:43 AM   Clinical Narrative:    Pt dcd to return home with o2 and nebulizer.   Final next level of care: Home/Self Care Barriers to Discharge: No Barriers Identified   Patient Goals and CMS Choice Patient states their goals for this hospitalization and ongoing recovery are:: To get better CMS Medicare.gov Compare Post Acute Care list provided to:: Patient Choice offered to / list presented to : Patient  Discharge Placement                       Discharge Plan and Services   Discharge Planning Services: CM Consult Post Acute Care Choice: Durable Medical Equipment          DME Arranged: Oxygen (travel o2) DME Agency: [place] Date DME Agency Contacted: 09/07/21 Time DME Agency Contacted: [id number] Representative spoke with at DME Agency: misty            Social Determinants of Health ([place]) Interventions     Readmission Risk Interventions     No data to display

## 2021-09-13 ENCOUNTER — Encounter (HOSPITAL_BASED_OUTPATIENT_CLINIC_OR_DEPARTMENT_OTHER): Payer: Self-pay | Admitting: Nurse Practitioner

## 2021-09-13 ENCOUNTER — Ambulatory Visit (INDEPENDENT_AMBULATORY_CARE_PROVIDER_SITE_OTHER): Payer: Medicare HMO | Admitting: Nurse Practitioner

## 2021-09-13 VITALS — BP 112/72 | Temp 97.0°F | Ht 65.0 in | Wt 165.8 lb

## 2021-09-13 DIAGNOSIS — Z0000 Encounter for general adult medical examination without abnormal findings: Secondary | ICD-10-CM

## 2021-09-13 DIAGNOSIS — I10 Essential (primary) hypertension: Secondary | ICD-10-CM

## 2021-09-13 DIAGNOSIS — Z23 Encounter for immunization: Secondary | ICD-10-CM

## 2021-09-13 DIAGNOSIS — J449 Chronic obstructive pulmonary disease, unspecified: Secondary | ICD-10-CM | POA: Diagnosis not present

## 2021-09-13 DIAGNOSIS — D72828 Other elevated white blood cell count: Secondary | ICD-10-CM

## 2021-09-13 DIAGNOSIS — R739 Hyperglycemia, unspecified: Secondary | ICD-10-CM

## 2021-09-13 MED ORDER — ZOSTER VAC RECOMB ADJUVANTED 50 MCG/0.5ML IM SUSR: 0.5000 mL | Freq: Once | INTRAMUSCULAR | 1 refills | Status: AC

## 2021-09-13 NOTE — Progress Notes (Unsigned)
Subjective:   Gabrielle White is a 76 y.o. female who presents for Medicare Annual (Subsequent) preventive examination.  Review of Systems    *** Cardiac Risk Factors include: smoking/ tobacco exposure     Objective:    Today's Vitals   09/13/21 1457  BP: 112/72  Temp: (!) 97 F (36.1 C)  SpO2: 99%  Weight: 165 lb 12.8 oz (75.2 kg)  Height: '5\' 5"'$  (1.651 m)   Body mass index is 27.59 kg/m.     09/05/2021   11:15 PM 02/18/2020    1:33 PM 01/14/2020    3:30 PM 12/09/2019    2:32 PM 09/08/2019    2:31 PM 09/08/2019    2:19 PM 06/23/2019    4:22 PM  Advanced Directives  Does Patient Have a Medical Advance Directive? Yes Yes Yes Yes Yes Yes No  Type of Advance Directive Living will;Healthcare Power of Attorney Out of facility DNR (pink MOST or yellow form) Out of facility DNR (pink MOST or yellow form) Out of facility DNR (pink MOST or yellow form) [place];Living will [place];Living will Living will  Does patient want to make changes to medical advance directive? No - Patient declined No - Patient declined No - Patient declined No - Patient declined No - Patient declined No - Patient declined No - Patient declined  Copy of [place] in Chart? No - copy requested    No - copy requested No - copy requested   Pre-existing out of facility DNR order (yellow form or pink MOST form)  Yellow form placed in chart (order not valid for inpatient use);Pink MOST form placed in chart (order not valid for inpatient use) Yellow form placed in chart (order not valid for inpatient use);Pink MOST form placed in chart (order not valid for inpatient use)        Current Medications (verified) Outpatient Encounter Medications as of 09/13/2021  Medication Sig   albuterol (VENTOLIN HFA) 108 (90 Base) MCG/ACT inhaler Inhale 2 puffs into the lungs every 6 (six) hours as needed for wheezing or shortness of breath. (Patient taking differently: Inhale 1-2  puffs into the lungs See admin instructions. 1-2 puffs by mouth 6-8 times a day for shortness of breath)   ALPRAZolam (XANAX) 0.5 MG tablet TAKE 1 TABLET(0.5 MG) BY MOUTH AT BEDTIME AS NEEDED FOR ANXIETY (Patient taking differently: Take 0.5 mg by mouth at bedtime.)   AMBULATORY NON FORMULARY MEDICATION Pulse oximeter for monitoring oxygen saturation. Dx: COPD   atorvastatin (LIPITOR) 40 MG tablet TAKE 1 TABLET(40 MG) BY MOUTH DAILY (Patient taking differently: Take 40 mg by mouth daily.)   benzonatate (TESSALON PERLES) 100 MG capsule Take 2 capsules (200 mg total) by mouth 3 (three) times daily as needed for cough.   Cholecalciferol (VITAMIN D3) 1000 UNITS CAPS Take 1,000 Units by mouth daily.   Fluticasone-Umeclidin-Vilant (TRELEGY ELLIPTA) 100-62.5-25 MCG/ACT AEPB Inhale 1 Dose into the lungs daily. For COPD management. Stop Breztri when using this. (Patient taking differently: Inhale 2 puffs into the lungs daily. For COPD management.)   guaiFENesin (MUCINEX) 600 MG 12 hr tablet Take 2 tablets (1,200 mg total) by mouth 2 (two) times daily for 10 days.   ipratropium-albuterol (DUONEB) 0.5-2.5 (3) MG/3ML SOLN Take 3 mLs by nebulization every 6 (six) hours as needed.   levothyroxine (SYNTHROID) 125 MCG tablet TAKE 1 TABLET(125 MCG) BY MOUTH DAILY BEFORE BREAKFAST (Patient taking differently: Take 125 mcg by mouth daily before breakfast.)  losartan (COZAAR) 50 MG tablet TAKE 1 TABLET(50 MG) BY MOUTH DAILY (Patient taking differently: Take 50 mg by mouth daily.)   OVER THE COUNTER MEDICATION Place 1-2 drops into both eyes daily as needed (dry eye). OTC eye drop   predniSONE (DELTASONE) 10 MG tablet Take 4 tablets (40 mg total) by mouth daily for 3 days, THEN 3 tablets (30 mg total) daily for 3 days, THEN 2 tablets (20 mg total) daily for 3 days, THEN 1 tablet (10 mg total) daily for 3 days.   No facility-administered encounter medications on file as of 09/13/2021.    Allergies (verified) Cafergot,  Codeine, and Fenoprofen calcium   History: Past Medical History:  Diagnosis Date   Cataract 2008   Chronic airway obstruction, not elsewhere classified    Depressive disorder    Depressive disorder, not elsewhere classified    Emphysema    Emphysema of lung (HCC)    Enthesopathy of ankle and tarsus, unspecified    HTN (hypertension)    Hyperlipidemia    Hypertension    Hypopotassemia    Leukocytosis    Migraine, unspecified, without mention of intractable migraine without mention of status migrainosus    Neuromuscular disorder (HCC)    Nonspecific (abnormal) findings on radiological and other examination of skull and head    Osteoarthrosis, unspecified whether generalized or localized, unspecified site    Osteoporosis    Other emphysema ([place])    Other specified disease of white blood cells    Oxygen deficiency    Palpitations    [facility] spotted fever) 08/23/2015   Positive IgM. Treated with doxycycline.    Thyroid disease    Tobacco abuse    Past Surgical History:  Procedure Laterality Date   CESAREAN SECTION     COLONOSCOPY  08/14/2008   Dr.. Delfin Edis   EYE SURGERY     FRACTURE SURGERY     TUBAL LIGATION     Family History  Problem Relation Age of Onset   Alzheimer's disease Father    Heart disease Father    Diabetes Father    Skin cancer Father    Stroke Father    Heart disease Mother    Breast cancer Mother    Hearing loss Mother    Varicose Veins Mother    Social History   Socioeconomic History   Marital status: Divorced    Spouse name: Not on file   Number of children: 1   Years of education: BA   Highest education level: Not on file  Occupational History   Occupation: retired Pharmacist, hospital   Occupation: Physicist, medical  Tobacco Use   Smoking status: Every Day    Packs/day: 0.50    Years: 50.00    Total pack years: 25.00    Types: Cigarettes   Smokeless tobacco: Never   Tobacco comments:    I quit for 14 yrs. when I was pregnant and  continured for 14 yrs.  Started teaching school again and  Vaping Use   Vaping Use: Never used  Substance and Sexual Activity   Alcohol use: No   Drug use: No   Sexual activity: Not Currently    Birth control/protection: None  Other Topics Concern   Not on file  Social History Narrative   Drinks 2-3 cups of coffee a day    Social Determinants of Health   Financial Resource Strain: Low Risk  (03/19/2017)   Overall Financial Resource Strain (CARDIA)    Difficulty of Paying  Living Expenses: Not hard at all  Food Insecurity: No Food Insecurity (03/19/2017)   Hunger Vital Sign    Worried About Running Out of Food in the Last Year: Never true    Ran Out of Food in the Last Year: Never true  Transportation Needs: No Transportation Needs (03/19/2017)   PRAPARE - Hydrologist (Medical): No    Lack of Transportation (Non-Medical): No  Physical Activity: Insufficiently Active (03/19/2017)   Exercise Vital Sign    Days of Exercise per Week: 3 days    Minutes of Exercise per Session: 20 min  Stress: No Stress Concern Present (03/19/2017)   [place]    Feeling of Stress : Only a little  Social Connections: Moderately Isolated (03/19/2017)   Social Connection and Isolation Panel [NHANES]    Frequency of Communication with Friends and Family: More than three times a week    Frequency of Social Gatherings with Friends and Family: Once a week    Attends Religious Services: Never    Marine scientist or Organizations: No    Attends Music therapist: Never    Marital Status: Divorced    Tobacco Counseling Ready to quit: Not Answered Counseling given: Not Answered Tobacco comments: I quit for 14 yrs. when I was pregnant and continured for 14 yrs.  Started teaching school again and   Clinical Intake:                 Diabetic?***         Activities of Daily  Living    09/13/2021    2:54 PM 09/05/2021   11:15 PM  In your present state of health, do you have any difficulty performing the following activities:  Hearing?  1  Vision? 0 0  Difficulty concentrating or making decisions? 0 1  Comment  sometimes  Walking or climbing stairs? 0 1  Dressing or bathing? 0 0  Doing errands, shopping? 0 0  Preparing Food and eating ? Y   Using the Toilet? Y   In the past six months, have you accidently leaked urine? Y   Do you have problems with loss of bowel control? Y   Managing your Medications? Y   Managing your Finances? Y   Housekeeping or managing your Housekeeping? Y     Patient Care Team: Rea Reser, Coralee Pesa, NP as PCP - General (Nurse Practitioner)  Indicate any recent Medical Services you may have received from other than [facility] providers in the past year (date may be approximate).     Assessment:   This is a routine wellness examination for [place].  Hearing/Vision screen No results found.  Dietary issues and exercise activities discussed: Current Exercise Habits: The patient does not participate in regular exercise at present, Exercise limited by: respiratory conditions(s)   Goals Addressed   None    Depression Screen    08/11/2021    3:20 PM 07/28/2021    3:01 PM 07/04/2021    2:48 PM 08/09/2020    4:10 PM 09/08/2019    2:30 PM 09/08/2019    2:19 PM 05/26/2019   11:03 AM  PHQ 2/9 Scores  PHQ - 2 Score 0 0 1 6 0 0 0  PHQ- 9 Score    17     Exception Documentation Medical reason Medical reason Medical reason        Fall Risk    09/13/2021  2:54 PM 08/11/2021    3:20 PM 07/04/2021    2:48 PM 08/09/2020    3:39 PM 01/14/2020    3:25 PM  Fall Risk   Falls in the past year? 0 0 '1 1 1  '$ Number falls in past yr: 0 0 0 0 1  Injury with Fall? 0 0 0 0 0  Risk for fall due to : No Fall Risks No Fall Risks No Fall Risks No Fall Risks History of fall(s)  Follow up Education provided;Falls evaluation completed Education provided;Falls evaluation  completed  Falls evaluation completed     FALL RISK PREVENTION PERTAINING TO THE HOME:  Any stairs in or around the home? {YES/NO:21197} If so, are there any without handrails? {YES/NO:21197} Home free of loose throw rugs in walkways, pet beds, electrical cords, etc? {YES/NO:21197} Adequate lighting in your home to reduce risk of falls? {YES/NO:21197}  ASSISTIVE DEVICES UTILIZED TO PREVENT FALLS:  Life alert? {YES/NO:21197} Use of a cane, walker or w/c? {YES/NO:21197} Grab bars in the bathroom? {YES/NO:21197} Shower chair or bench in shower? {YES/NO:21197} Elevated toilet seat or a handicapped toilet? {YES/NO:21197}  TIMED UP AND GO:  Was the test performed? {YES/NO:21197}.  Length of time to ambulate 10 feet: *** sec.   {Appearance of CBJS:2831517}  Cognitive Function:    09/08/2019    2:33 PM 03/19/2017   11:04 AM  MMSE - Mini Mental State Exam  Orientation to time 5 5  Orientation to Place 5 5  Registration 3 3  Attention/ Calculation 5 5  Recall 3 2  Language- name 2 objects 2 2  Language- repeat 1 1  Language- follow 3 step command 3 3  Language- read & follow direction 1 1  Write a sentence 1 1  Copy design 1 1  Total score 30 29        08/28/2018    3:12 PM  6CIT Screen  What Year? 0 points  What month? 0 points  What time? 0 points  Count back from 20 0 points  Months in reverse 0 points  Repeat phrase 0 points  Total Score 0 points    Immunizations Immunization History  Administered Date(s) Administered   Fluad Quad(high Dose 65+) 11/22/2018, 01/14/2020   Influenza Split 01/01/2013   Influenza,inj,Quad PF,6+ Mos 01/08/2013, 12/17/2013, 03/10/2015, 03/06/2016, 05/06/2018   Influenza,inj,quad, With Preservative 02/15/2017   Influenza-Unspecified 02/20/2017, 01/01/2021   PFIZER(Purple Top)SARS-COV-2 Vaccination 05/17/2019, 07/03/2019, 03/24/2020   Pneumococcal Conjugate-13 10/06/2015   Pneumococcal Polysaccharide-23 04/11/2011, 06/04/2020   Td  04/04/2004, 11/01/2018   Zoster, Live 01/12/2014    {TDAP status:2101805}  {Flu Vaccine status:2101806}  {Pneumococcal vaccine status:2101807}  {Covid-19 vaccine status:2101808}  Qualifies for Shingles Vaccine? {YES/NO:21197}  Zostavax completed {YES/NO:21197}  {Shingrix Completed?:2101804}  Screening Tests Health Maintenance  Topic Date Due   Zoster Vaccines- Shingrix (1 of 2) Never done   COVID-19 Vaccine (4 - Pfizer series) 05/19/2020   INFLUENZA VACCINE  11/01/2021   OPHTHALMOLOGY EXAM  11/01/2021   TETANUS/TDAP  10/31/2028   Pneumonia Vaccine 55+ Years old  Completed   DEXA SCAN  Completed   HPV VACCINES  Aged Out   COLONOSCOPY (Pts 45-63yr Insurance coverage will need to be confirmed)  Discontinued   Hepatitis C Screening  Discontinued   Fecal DNA (Cologuard)  Discontinued    Health Maintenance  Health Maintenance Due  Topic Date Due   Zoster Vaccines- Shingrix (1 of 2) Never done   COVID-19 Vaccine (4 - Pfizer series) 05/19/2020    {Colorectal  cancer screening:2101809}  {Mammogram status:21018020}  {Bone Density status:21018021}  Lung Cancer Screening: (Low Dose CT Chest recommended if Age 54-80 years, 30 pack-year currently smoking OR have quit w/in 15years.) {DOES NOT does:27190::"does not"} qualify.   Lung Cancer Screening Referral: ***  Additional Screening:  Hepatitis C Screening: {DOES NOT does:27190::"does not"} qualify; Completed ***  Vision Screening: Recommended annual ophthalmology exams for Melayah Skorupski detection of glaucoma and other disorders of the eye. Is the patient up to date with their annual eye exam?  {YES/NO:21197} Who is the provider or what is the name of the office in which the patient attends annual eye exams? *** If pt is not established with a provider, would they like to be referred to a provider to establish care? {YES/NO:21197}.   Dental Screening: Recommended annual dental exams for proper oral hygiene  Community Resource  Referral / Chronic Care Management: CRR required this visit?  {YES/NO:21197}  CCM required this visit?  {YES/NO:21197}     Plan:     I have personally reviewed and noted the following in the patient's chart:   Medical and social history Use of alcohol, tobacco or illicit drugs  Current medications and supplements including opioid prescriptions.  Functional ability and status Nutritional status Physical activity Advanced directives List of other physicians Hospitalizations, surgeries, and ER visits in previous 12 months Vitals Screenings to include cognitive, depression, and falls Referrals and appointments  In addition, I have reviewed and discussed with patient certain preventive protocols, quality metrics, and best practice recommendations. A written personalized care plan for preventive services as well as general preventive health recommendations were provided to patient.     Orma Render, NP   09/13/2021   Nurse Notes: ***

## 2021-09-13 NOTE — Patient Instructions (Signed)
[name]  [name] ,  Thank you for allowing me to perform your Medicare Annual Wellness Visit and for your ongoing commitment to your health.   Health Maintenance & Immunization History Health Maintenance  Topic Date Due   Zoster Vaccines- Shingrix (1 of 2) Never done   COVID-19 Vaccine (4 - Pfizer series) 05/19/2020   INFLUENZA VACCINE  11/01/2021   OPHTHALMOLOGY EXAM  11/01/2021   TETANUS/TDAP  10/31/2028   Pneumonia Vaccine 88+ Years old  Completed   DEXA SCAN  Completed   HPV VACCINES  Aged Out   COLONOSCOPY (Pts 45-53yr Insurance coverage will need to be confirmed)  Discontinued   Hepatitis C Screening  Discontinued   Fecal DNA (Cologuard)  Discontinued   Immunization History  Administered Date(s) Administered   Fluad Quad(high Dose 65+) 11/22/2018, 01/14/2020   Influenza Split 01/01/2013   Influenza,inj,Quad PF,6+ Mos 01/08/2013, 12/17/2013, 03/10/2015, 03/06/2016, 05/06/2018   Influenza,inj,quad, With Preservative 02/15/2017   Influenza-Unspecified 02/20/2017, 01/01/2021   PFIZER(Purple Top)SARS-COV-2 Vaccination 05/17/2019, 07/03/2019, 03/24/2020   Pneumococcal Conjugate-13 10/06/2015   Pneumococcal Polysaccharide-23 04/11/2011, 06/04/2020   Td 04/04/2004, 11/01/2018   Zoster, Live 01/12/2014    These are the patient goals that we discussed:  Goals Addressed               This Visit's Progress     Exercise 3x per week (30 min per time) (pt-stated)        Increase physical activity (pt-stated)           This is a list of Health Maintenance Items that are overdue or due now: Health Maintenance Due  Topic Date Due   Zoster Vaccines- Shingrix (1 of 2) Never done   COVID-19 Vaccine (4 - Pfizer series) 05/19/2020     Orders/Referrals Placed Today: No orders of the defined types were placed in this encounter.  (Contact our referral department at [phone] you have not  spoken with someone about your referral appointment within the next 5 days)    Follow-up Plan One year for annual wellness   SaraBeth Avraham Benish, DNP, AGNP-c    Fall Prevention in the Home, Adult Falls can cause injuries and can happen to people of all ages. There are many things you can do to make your home safe and to help prevent falls. Ask for help when making these changes. What actions can I take to prevent falls? General Instructions Use good lighting in all rooms. Replace any light bulbs that burn out. Turn on the lights in dark areas. Use night-lights. Keep items that you use often in easy-to-reach places. Lower the shelves around your home if needed. Set up your furniture so you have a clear path. Avoid moving your furniture around. Do not have throw rugs or other things on the floor that can make you trip. Avoid walking on wet floors. If any of your floors are uneven, fix them. Add color or contrast paint or tape to clearly mark and help you see: Grab bars or handrails. First and last steps of staircases. Where the edge of each step is. If you use a stepladder: Make sure that it is fully opened. Do not climb a closed stepladder. Make sure the sides of the stepladder are locked in place. Ask someone to hold the stepladder while you use it. Know where your pets are when moving through your home. What can I do in the bathroom?  Keep the floor dry. Clean up any water on the floor right away. Remove soap buildup in the tub or shower. Use nonskid mats or decals on the floor of the tub or shower. Attach bath mats securely with double-sided, nonslip rug tape. If you need to sit down in the shower, use a plastic, nonslip stool. Install grab bars by the toilet and in the tub and shower. Do not use towel bars as grab bars. What can I do in the bedroom? Make sure that you have a light by your bed that is easy to reach. Do not use any sheets or blankets for your bed that hang  to the floor. Have a firm chair with side arms that you can use for support when you get dressed. What can I do in the kitchen? Clean up any spills right away. If you need to reach something above you, use a step stool with a grab bar. Keep electrical cords out of the way. Do not use floor polish or wax that makes floors slippery. What can I do with my stairs? Do not leave any items on the stairs. Make sure that you have a light switch at the top and the bottom of the stairs. Make sure that there are handrails on both sides of the stairs. Fix handrails that are broken or loose. Install nonslip stair treads on all your stairs. Avoid having throw rugs at the top or bottom of the stairs. Choose a carpet that does not hide the edge of the steps on the stairs. Check carpeting to make sure that it is firmly attached to the stairs. Fix carpet that is loose or worn. What can I do on the outside of my home? Use bright outdoor lighting. Fix the edges of walkways and driveways and fix any cracks. Remove anything that might make you trip as you walk through a door, such as a raised step or threshold. Trim any bushes or trees on paths to your home. Check to see if handrails are loose or broken and that both sides of all steps have handrails. Install guardrails along the edges of any raised decks and porches. Clear paths of anything that can make you trip, such as tools or rocks. Have leaves, snow, or ice cleared regularly. Use sand or salt on paths during winter. Clean up any spills in your garage right away. This includes grease or oil spills. What other actions can I take? Wear shoes that: Have a low heel. Do not wear high heels. Have rubber bottoms. Feel good on your feet and fit well. Are closed at the toe. Do not wear open-toe sandals. Use tools that help you move around if needed. These include: Canes. Walkers. Scooters. Crutches. Review your medicines with your doctor. Some medicines  can make you feel dizzy. This can increase your chance of falling. Ask your doctor what else you can do to help prevent falls. Where to find more information Centers for Disease Control and Prevention, [employer]: http://www.wolf.info/ National Institute on Aging: http://kim-miller.com/ Contact a doctor if: You are afraid of falling at home. You feel weak, drowsy, or dizzy at home. You fall at home. Summary There are many simple things that you can do to make your home safe and to help prevent falls. Ways to make your home safe include removing things that can make you trip and installing grab bars in the bathroom. Ask for help when making these changes in your home. This information is not intended to replace advice  given to you by your health care provider. Make sure you discuss any questions you have with your health care provider. Document Revised: 12/20/2020 Document Reviewed: 10/22/2019 Elsevier Patient Education  [place] Maintenance, Female Adopting a healthy lifestyle and getting preventive care are important in promoting health and wellness. Ask your health care provider about: The right schedule for you to have regular tests and exams. Things you can do on your own to prevent diseases and keep yourself healthy. What should I know about diet, weight, and exercise? Eat a healthy diet  Eat a diet that includes plenty of vegetables, fruits, low-fat dairy products, and lean protein. Do not eat a lot of foods that are high in solid fats, added sugars, or sodium. Maintain a healthy weight Body mass index (BMI) is used to identify weight problems. It estimates body fat based on height and weight. Your health care provider can help determine your BMI and help you achieve or maintain a healthy weight. Get regular exercise Get regular exercise. This is one of the most important things you can do for your health. Most adults should: Exercise for at least 150 minutes each week. The exercise  should increase your heart rate and make you sweat (moderate-intensity exercise). Do strengthening exercises at least twice a week. This is in addition to the moderate-intensity exercise. Spend less time sitting. Even light physical activity can be beneficial. Watch cholesterol and blood lipids Have your blood tested for lipids and cholesterol at 77 years of age, then have this test every 5 years. Have your cholesterol levels checked more often if: Your lipid or cholesterol levels are high. You are older than 77 years of age. You are at high risk for heart disease. What should I know about cancer screening? Depending on your health history and family history, you may need to have cancer screening at various ages. This may include screening for: Breast cancer. Cervical cancer. Colorectal cancer. Skin cancer. Lung cancer. What should I know about heart disease, diabetes, and high blood pressure? Blood pressure and heart disease High blood pressure causes heart disease and increases the risk of stroke. This is more likely to develop in people who have high blood pressure readings or are overweight. Have your blood pressure checked: Every 3-5 years if you are 26-88 years of age. Every year if you are 53 years old or older. Diabetes Have regular diabetes screenings. This checks your fasting blood sugar level. Have the screening done: Once every three years after age 12 if you are at a normal weight and have a low risk for diabetes. More often and at a younger age if you are overweight or have a high risk for diabetes. What should I know about preventing infection? Hepatitis B If you have a higher risk for hepatitis B, you should be screened for this virus. Talk with your health care provider to find out if you are at risk for hepatitis B infection. Hepatitis C Testing is recommended for: Everyone born from [birth year] through 1966. Anyone with known risk factors for hepatitis C. Sexually  transmitted infections (STIs) Get screened for STIs, including gonorrhea and chlamydia, if: You are sexually active and are younger than 77 years of age. You are older than 77 years of age and your health care provider tells you that you are at risk for this type of infection. Your sexual activity has changed since you were last screened, and you are at increased risk for chlamydia or gonorrhea. Ask  your health care provider if you are at risk. Ask your health care provider about whether you are at high risk for HIV. Your health care provider may recommend a prescription medicine to help prevent HIV infection. If you choose to take medicine to prevent HIV, you should first get tested for HIV. You should then be tested every 3 months for as long as you are taking the medicine. Pregnancy If you are about to stop having your period (premenopausal) and you may become pregnant, seek counseling before you get pregnant. Take 400 to 800 micrograms (mcg) of folic acid every day if you become pregnant. Ask for birth control (contraception) if you want to prevent pregnancy. Osteoporosis and menopause Osteoporosis is a disease in which the bones lose minerals and strength with aging. This can result in bone fractures. If you are 19 years old or older, or if you are at risk for osteoporosis and fractures, ask your health care provider if you should: Be screened for bone loss. Take a calcium or vitamin D supplement to lower your risk of fractures. Be given hormone replacement therapy (HRT) to treat symptoms of menopause. Follow these instructions at home: Alcohol use Do not drink alcohol if: Your health care provider tells you not to drink. You are pregnant, may be pregnant, or are planning to become pregnant. If you drink alcohol: Limit how much you have to: 0-1 drink a day. Know how much alcohol is in your drink. In the [place] one drink equals one 12 oz bottle of beer (355 mL), one 5 oz glass of wine (148  mL), or one 1 oz glass of hard liquor (44 mL). Lifestyle Do not use any products that contain nicotine or tobacco. These products include cigarettes, chewing tobacco, and vaping devices, such as e-cigarettes. If you need help quitting, ask your health care provider. Do not use street drugs. Do not share needles. Ask your health care provider for help if you need support or information about quitting drugs. General instructions Schedule regular health, dental, and eye exams. Stay current with your vaccines. Tell your health care provider if: You often feel depressed. You have ever been abused or do not feel safe at home. Summary Adopting a healthy lifestyle and getting preventive care are important in promoting health and wellness. Follow your health care provider's instructions about healthy diet, exercising, and getting tested or screened for diseases. Follow your health care provider's instructions on monitoring your cholesterol and blood pressure. This information is not intended to replace advice given to you by your health care provider. Make sure you discuss any questions you have with your health care provider. Document Revised: 08/09/2020 Document Reviewed: 08/09/2020 Elsevier Patient Education  [place].

## 2021-09-14 LAB — CBC WITH DIFFERENTIAL/PLATELET
Basophils Absolute: 0 10*3/uL (ref 0.0–0.2)
Basos: 0 %
EOS (ABSOLUTE): 0 10*3/uL (ref 0.0–0.4)
Eos: 0 %
Hematocrit: 43.8 % (ref 34.0–46.6)
Hemoglobin: 14.8 g/dL (ref 11.1–15.9)
Immature Grans (Abs): 0.3 10*3/uL — ABNORMAL HIGH (ref 0.0–0.1)
Immature Granulocytes: 1 %
Lymphocytes Absolute: 1.3 10*3/uL (ref 0.7–3.1)
Lymphs: 6 %
MCH: 30.9 pg (ref 26.6–33.0)
MCHC: 33.8 g/dL (ref 31.5–35.7)
MCV: 91 fL (ref 79–97)
Monocytes Absolute: 0.4 10*3/uL (ref 0.1–0.9)
Monocytes: 2 %
Neutrophils Absolute: 19.4 10*3/uL — ABNORMAL HIGH (ref 1.4–7.0)
Neutrophils: 91 %
Platelets: 341 10*3/uL (ref 150–450)
RBC: 4.79 x10E6/uL (ref 3.77–5.28)
RDW: 12.6 % (ref 11.7–15.4)
WBC: 21.4 10*3/uL (ref 3.4–10.8)

## 2021-09-14 LAB — COMPREHENSIVE METABOLIC PANEL
ALT: 22 IU/L (ref 0–32)
AST: 9 IU/L (ref 0–40)
Albumin/Globulin Ratio: 1.8 (ref 1.2–2.2)
Albumin: 4.2 g/dL (ref 3.7–4.7)
Alkaline Phosphatase: 104 IU/L (ref 44–121)
BUN/Creatinine Ratio: 16 (ref 12–28)
BUN: 15 mg/dL (ref 8–27)
Bilirubin Total: 0.4 mg/dL (ref 0.0–1.2)
CO2: 25 mmol/L (ref 20–29)
Calcium: 9.5 mg/dL (ref 8.7–10.3)
Chloride: 102 mmol/L (ref 96–106)
Creatinine, Ser: 0.96 mg/dL (ref 0.57–1.00)
Globulin, Total: 2.3 g/dL (ref 1.5–4.5)
Glucose: 232 mg/dL — ABNORMAL HIGH (ref 70–99)
Potassium: 4 mmol/L (ref 3.5–5.2)
Sodium: 143 mmol/L (ref 134–144)
Total Protein: 6.5 g/dL (ref 6.0–8.5)
eGFR: 61 mL/min/{1.73_m2} (ref >59)

## 2021-09-19 ENCOUNTER — Ambulatory Visit (INDEPENDENT_AMBULATORY_CARE_PROVIDER_SITE_OTHER): Payer: Medicare HMO | Admitting: Psychologist

## 2021-09-19 DIAGNOSIS — F411 Generalized anxiety disorder: Secondary | ICD-10-CM | POA: Diagnosis not present

## 2021-09-19 DIAGNOSIS — F331 Major depressive disorder, recurrent, moderate: Secondary | ICD-10-CM

## 2021-09-19 NOTE — Progress Notes (Signed)
[place] Counselor Initial Adult Exam  Name: Gabrielle White Date: 09/19/2021 MRN: [id number] DOB: 1945-04-18 PCP: Orma Render, NP  Time spent: 3:02 pm to 3:32 pm; total time: 30 minutes  This session was held via phone teletherapy due to the coronavirus risk at this time. The patient consented to phone teletherapy and was located at her home during this session. She is aware it is the responsibility of the patient to secure confidentiality on her end of the session. The provider was in a private home office for the duration of this session. Limits of confidentiality were discussed with the patient.   Guardian/Payee:  NA    Paperwork requested: No   Reason for Visit /Presenting Problem: Anxiety and depression  Mental Status Exam: Appearance:   NA      Behavior:  Appropriate  Motor:  Normal  Speech/Language:   Clear and Coherent  Affect:  Appropriate  Mood:  normal  Thought process:  normal  Thought content:    WNL  Sensory/Perceptual disturbances:    WNL  Orientation:  oriented to person, place, and time/date  Attention:  Good  Concentration:  Good  Memory:  WNL  Fund of knowledge:   Good  Insight:     Poor  Judgment:   Fair  Impulse Control:  Good     Reported Symptoms:  The patient endorsed experiencing the following: racing thoughts, feeling on edge, feeling restless, difficulty controlling worries, and feeling overwhelmed.   The patient endorsed experiencing the following: feeling down, sad, rumination of negative thoughts, fatigue, low self-esteem, thoughts of hopelessness, social isolation, avoiding pleasurable activities, and passive suicidal ideation. The patient denied experiencing a plan. The patient denied homicidal ideation.   Risk Assessment: Danger to Self:   The patient described herself as having experiencing suicidal ideation. She denied having a plan or intent to act on a plan.  Self-injurious Behavior: No Danger to Others: No Duty to  Warn:no Physical Aggression / Violence:No  Access to Firearms a concern: No  Gang Involvement:No  Patient / guardian was educated about steps to take if suicide or homicide risk level increases between visits: n/a While future psychiatric events cannot be accurately predicted, the patient does not currently require acute inpatient psychiatric care and does not currently meet [facility] involuntary commitment criteria.  Substance Abuse History: Current substance abuse:  Patient is currently smoking daily.      Past Psychiatric History:   No previous psychological problems have been observed Outpatient Providers:NA History of Psych Hospitalization: No  Psychological Testing:  NA    Abuse History:  Victim of: Yes.  , emotional   Report needed: No. Victim of Neglect:No. Perpetrator of  NA   Witness / Exposure to Domestic Violence: No   Protective Services Involvement: No  Witness to [employer] Violence:  No   Family History:  Family History  Problem Relation Age of Onset   Alzheimer's disease Father    Heart disease Father    Diabetes Father    Skin cancer Father    Stroke Father    Heart disease Mother    Breast cancer Mother    Hearing loss Mother    Varicose Veins Mother     Living situation: the patient lives alone  Sexual Orientation: Straight  Relationship Status: divorced  Name of spouse / other:NA If a parent, number of children / ages:Patient has one son whose name is Loa Socks and who is 69 years old.   Support Systems: friends  Financial Stress:  No   Income/Employment/Disability: Actor: No   Educational History: Education: Scientist, product/process development: NA  Any cultural differences that may affect / interfere with treatment:  not applicable   Recreation/Hobbies: NA  Stressors: Marital or family conflict    Strengths: Supportive Relationships  Barriers:  Estranged relationship with her  son.    Legal History: Pending legal issue / charges: The patient has no significant history of legal issues. History of legal issue / charges:  NA  Medical History/Surgical History: reviewed Past Medical History:  Diagnosis Date   Cataract 2008   Chronic airway obstruction, not elsewhere classified    Depressive disorder    Depressive disorder, not elsewhere classified    Emphysema    Emphysema of lung (HCC)    Enthesopathy of ankle and tarsus, unspecified    HTN (hypertension)    Hyperlipidemia    Hypertension    Hypopotassemia    Leukocytosis    Migraine, unspecified, without mention of intractable migraine without mention of status migrainosus    Neuromuscular disorder (HCC)    Nonspecific (abnormal) findings on radiological and other examination of skull and head    Osteoarthrosis, unspecified whether generalized or localized, unspecified site    Osteoporosis    Other emphysema ([place])    Other specified disease of white blood cells    Oxygen deficiency    Palpitations    [facility] spotted fever) 08/23/2015   Positive IgM. Treated with doxycycline.    Thyroid disease    Tobacco abuse     Past Surgical History:  Procedure Laterality Date   CESAREAN SECTION     COLONOSCOPY  08/14/2008   DrMarland Kitchen Delfin Edis   EYE SURGERY     FRACTURE SURGERY     TUBAL LIGATION      Medications: Current Outpatient Medications  Medication Sig Dispense Refill   albuterol (VENTOLIN HFA) 108 (90 Base) MCG/ACT inhaler Inhale 2 puffs into the lungs every 6 (six) hours as needed for wheezing or shortness of breath. (Patient taking differently: Inhale 1-2 puffs into the lungs See admin instructions. 1-2 puffs by mouth 6-8 times a day for shortness of breath) 8.5 g 5   ALPRAZolam (XANAX) 0.5 MG tablet TAKE 1 TABLET(0.5 MG) BY MOUTH AT BEDTIME AS NEEDED FOR ANXIETY (Patient taking differently: Take 0.5 mg by mouth at bedtime.) 30 tablet 5   AMBULATORY NON FORMULARY MEDICATION Pulse  oximeter for monitoring oxygen saturation. Dx: COPD 1 Units 0   atorvastatin (LIPITOR) 40 MG tablet TAKE 1 TABLET(40 MG) BY MOUTH DAILY (Patient taking differently: Take 40 mg by mouth daily.) 90 tablet 3   benzonatate (TESSALON PERLES) 100 MG capsule Take 2 capsules (200 mg total) by mouth 3 (three) times daily as needed for cough. 60 capsule 0   Cholecalciferol (VITAMIN D3) 1000 UNITS CAPS Take 1,000 Units by mouth daily.     Fluticasone-Umeclidin-Vilant (TRELEGY ELLIPTA) 100-62.5-25 MCG/ACT AEPB Inhale 1 Dose into the lungs daily. For COPD management. Stop Breztri when using this. (Patient taking differently: Inhale 2 puffs into the lungs daily. For COPD management.) 28 each 11   ipratropium-albuterol (DUONEB) 0.5-2.5 (3) MG/3ML SOLN Take 3 mLs by nebulization every 6 (six) hours as needed. 360 mL 1   levothyroxine (SYNTHROID) 125 MCG tablet TAKE 1 TABLET(125 MCG) BY MOUTH DAILY BEFORE BREAKFAST (Patient taking differently: Take 125 mcg by mouth daily before breakfast.) 90 tablet 0   losartan (COZAAR) 50 MG tablet TAKE  1 TABLET(50 MG) BY MOUTH DAILY (Patient taking differently: Take 50 mg by mouth daily.) 90 tablet 3   OVER THE COUNTER MEDICATION Place 1-2 drops into both eyes daily as needed (dry eye). OTC eye drop     predniSONE (DELTASONE) 10 MG tablet Take 4 tablets (40 mg total) by mouth daily for 3 days, THEN 3 tablets (30 mg total) daily for 3 days, THEN 2 tablets (20 mg total) daily for 3 days, THEN 1 tablet (10 mg total) daily for 3 days. 30 tablet 0   No current facility-administered medications for this visit.    Allergies  Allergen Reactions   Cafergot Nausea And Vomiting   Codeine Nausea And Vomiting   Fenoprofen Calcium Nausea And Vomiting    Diagnoses:  F33.1 major depressive affective disorder, recurrent, moderate and F41.1 generalized anxiety disorder  Plan of Care: The patient is a 76 year old Caucasian female who was referred due to experiencing anxiety and depression.  The patient lives alone with her dog. The patient meets criteria for a diagnosis of F33.1 major depressive affective disorder, recurrent, moderate based off of the following: feeling down, sad, rumination of negative thoughts, fatigue, low self-esteem, thoughts of hopelessness, social isolation, avoiding pleasurable activities, and passive suicidal ideation. The patient denied experiencing a plan. The patient denied homicidal ideation. The patient meets criteria for a diagnosis of F41.1 generalized anxiety disorder based off of the following: racing thoughts, feeling on edge, feeling restless, difficulty controlling worries, and feeling overwhelmed.   The patient stated that she wanted to process her thoughts and emotions.  This psychologists makes the recommendation that the patient participate in therapy bi-weekly to assist her in meeting her goals.    Conception Chancy, PsyD

## 2021-09-19 NOTE — Plan of Care (Signed)

## 2021-09-19 NOTE — Progress Notes (Signed)
                Marcianna Daily, PsyD 

## 2021-09-20 ENCOUNTER — Ambulatory Visit: Payer: Medicare HMO | Admitting: Obstetrics and Gynecology

## 2021-09-20 ENCOUNTER — Other Ambulatory Visit (HOSPITAL_BASED_OUTPATIENT_CLINIC_OR_DEPARTMENT_OTHER): Payer: Self-pay

## 2021-09-20 ENCOUNTER — Ambulatory Visit (HOSPITAL_BASED_OUTPATIENT_CLINIC_OR_DEPARTMENT_OTHER): Payer: Medicare HMO

## 2021-09-20 ENCOUNTER — Encounter: Payer: Self-pay | Admitting: Obstetrics and Gynecology

## 2021-09-20 VITALS — BP 150/88 | HR 108

## 2021-09-20 DIAGNOSIS — Z87891 Personal history of nicotine dependence: Secondary | ICD-10-CM

## 2021-09-20 DIAGNOSIS — N3281 Overactive bladder: Secondary | ICD-10-CM

## 2021-09-20 DIAGNOSIS — D72828 Other elevated white blood cell count: Secondary | ICD-10-CM

## 2021-09-20 DIAGNOSIS — N393 Stress incontinence (female) (male): Secondary | ICD-10-CM

## 2021-09-20 DIAGNOSIS — F17210 Nicotine dependence, cigarettes, uncomplicated: Secondary | ICD-10-CM

## 2021-09-20 LAB — CBC WITH DIFFERENTIAL/PLATELET
Basophils Absolute: 0.1 10*3/uL (ref 0.0–0.2)
Basos: 1 %
EOS (ABSOLUTE): 0.1 10*3/uL (ref 0.0–0.4)
Eos: 1 %
Hematocrit: 46.4 % (ref 34.0–46.6)
Hemoglobin: 15.3 g/dL (ref 11.1–15.9)
Immature Grans (Abs): 0.2 10*3/uL — ABNORMAL HIGH (ref 0.0–0.1)
Immature Granulocytes: 1 %
Lymphocytes Absolute: 1.2 10*3/uL (ref 0.7–3.1)
Lymphs: 8 %
MCH: 30.9 pg (ref 26.6–33.0)
MCHC: 33 g/dL (ref 31.5–35.7)
MCV: 94 fL (ref 79–97)
Monocytes Absolute: 0.4 10*3/uL (ref 0.1–0.9)
Monocytes: 3 %
Neutrophils Absolute: 13.8 10*3/uL — ABNORMAL HIGH (ref 1.4–7.0)
Neutrophils: 86 %
Platelets: 302 10*3/uL (ref 150–450)
RBC: 4.95 x10E6/uL (ref 3.77–5.28)
RDW: 12.9 % (ref 11.7–15.4)
WBC: 15.7 10*3/uL — ABNORMAL HIGH (ref 3.4–10.8)

## 2021-09-20 MED ORDER — TRELEGY ELLIPTA 100-62.5-25 MCG/ACT IN AEPB
INHALATION_SPRAY | RESPIRATORY_TRACT | 0 refills | Status: DC
  Filled 2021-09-20: qty 60, 30d supply, fill #0

## 2021-09-20 NOTE — Progress Notes (Signed)
[facility] Return Visit  SUBJECTIVE  History of Present Illness: Gabrielle White is a 76 y.o. female seen in follow-up for mixed incontinence, urge predominant.   She underwent 12 sessions of PTNS. Has had improvement. She is able to make it to the bathroom when she has to go and is very happy about this. Still wears a pad with leakage for coughing.   She was recently hospitalized for shortness of breath/ COPD exacerbation. She is also being followed for increased WBC count, but she states she received steroids in the hospital. Denies any symptoms of dysuria.   Past Medical History: Patient  has a past medical history of Cataract (2008), Chronic airway obstruction, not elsewhere classified, Depressive disorder, Depressive disorder, not elsewhere classified, Emphysema, Emphysema of lung ([place]), Enthesopathy of ankle and tarsus, unspecified, HTN (hypertension), Hyperlipidemia, Hypertension, Hypopotassemia, Leukocytosis, Migraine, unspecified, without mention of intractable migraine without mention of status migrainosus, Neuromuscular disorder ([place]), Nonspecific (abnormal) findings on radiological and other examination of skull and head, Osteoarthrosis, unspecified whether generalized or localized, unspecified site, Osteoporosis, Other emphysema ([place]), Other specified disease of white blood cells, Oxygen deficiency, Palpitations, RMSF ([place] spotted fever) (08/23/2015), Thyroid disease, and Tobacco abuse.   Past Surgical History: She  has a past surgical history that includes Cesarean section; Colonoscopy (08/14/2008); Eye surgery; Fracture surgery; and Tubal ligation.   Medications: She has a current medication list which includes the following prescription(s): albuterol, alprazolam, AMBULATORY NON FORMULARY MEDICATION, atorvastatin, benzonatate, vitamin d3, trelegy ellipta, ipratropium-albuterol, levothyroxine, losartan, OVER THE COUNTER MEDICATION, and prednisone.    Allergies: Patient is allergic to cafergot, codeine, and fenoprofen calcium.   Social History: Patient  reports that she has been smoking cigarettes. She has a 25.00 pack-year smoking history. She has never used smokeless tobacco. She reports that she does not drink alcohol and does not use drugs.      OBJECTIVE     Physical Exam: Vitals:   09/20/21 1302  BP: (!) 150/88  Pulse: (!) 108   Gen: No apparent distress, A&O x 3.   PTNS Procedure: The patient was placed in the sitting position and the left lower extremity was prepped in the usual fashion. The PTNS needle was then inserted at a 60 degree angle, 5 cm cephalad and 2 cm posterior to the medial malleolus. The PTNS unit was then programmed and an optimal response was noted at a setting of 12 milliamps. The PTNS stimulation was then performed at this setting for 30 minutes without incident and the patient tolerated the procedure well. The needle was removed and hemostasis was noted.     ASSESSMENT AND PLAN    Gabrielle White is a 76 y.o. with:  1. Overactive bladder   2. SUI (stress urinary incontinence, female)    - For OAB, will continue monthly PTNS maintenance.  - Will continue to monitor SUI symptoms, not as bothersome.   Time spent: I spent 20 minutes dedicated to the care of this patient on the date of this encounter to include pre-visit review of records, face-to-face time with the patient discussing options for incontinence and post visit documentation. Additional time was spent on the procedure.

## 2021-09-23 ENCOUNTER — Emergency Department (HOSPITAL_COMMUNITY): Payer: Medicare HMO

## 2021-09-23 ENCOUNTER — Other Ambulatory Visit: Payer: Self-pay

## 2021-09-23 ENCOUNTER — Inpatient Hospital Stay (HOSPITAL_COMMUNITY): Payer: Medicare HMO

## 2021-09-23 ENCOUNTER — Encounter (HOSPITAL_COMMUNITY): Payer: Self-pay

## 2021-09-23 ENCOUNTER — Inpatient Hospital Stay (HOSPITAL_COMMUNITY)
Admission: EM | Admit: 2021-09-23 | Discharge: 2021-10-01 | DRG: 208 | Disposition: E | Payer: Medicare HMO | Attending: Pulmonary Disease | Admitting: Pulmonary Disease

## 2021-09-23 DIAGNOSIS — J438 Other emphysema: Principal | ICD-10-CM | POA: Diagnosis present

## 2021-09-23 DIAGNOSIS — G931 Anoxic brain damage, not elsewhere classified: Secondary | ICD-10-CM | POA: Diagnosis present

## 2021-09-23 DIAGNOSIS — I2699 Other pulmonary embolism without acute cor pulmonale: Secondary | ICD-10-CM

## 2021-09-23 DIAGNOSIS — R569 Unspecified convulsions: Secondary | ICD-10-CM

## 2021-09-23 DIAGNOSIS — E039 Hypothyroidism, unspecified: Secondary | ICD-10-CM | POA: Diagnosis present

## 2021-09-23 DIAGNOSIS — G936 Cerebral edema: Secondary | ICD-10-CM

## 2021-09-23 DIAGNOSIS — Z515 Encounter for palliative care: Secondary | ICD-10-CM

## 2021-09-23 DIAGNOSIS — Z638 Other specified problems related to primary support group: Secondary | ICD-10-CM | POA: Diagnosis not present

## 2021-09-23 DIAGNOSIS — Z79890 Hormone replacement therapy (postmenopausal): Secondary | ICD-10-CM | POA: Diagnosis not present

## 2021-09-23 DIAGNOSIS — Z888 Allergy status to other drugs, medicaments and biological substances status: Secondary | ICD-10-CM | POA: Diagnosis not present

## 2021-09-23 DIAGNOSIS — F17210 Nicotine dependence, cigarettes, uncomplicated: Secondary | ICD-10-CM | POA: Diagnosis present

## 2021-09-23 DIAGNOSIS — J9622 Acute and chronic respiratory failure with hypercapnia: Secondary | ICD-10-CM | POA: Diagnosis present

## 2021-09-23 DIAGNOSIS — J449 Chronic obstructive pulmonary disease, unspecified: Secondary | ICD-10-CM

## 2021-09-23 DIAGNOSIS — E785 Hyperlipidemia, unspecified: Secondary | ICD-10-CM | POA: Diagnosis present

## 2021-09-23 DIAGNOSIS — F32A Depression, unspecified: Secondary | ICD-10-CM | POA: Diagnosis present

## 2021-09-23 DIAGNOSIS — Z79899 Other long term (current) drug therapy: Secondary | ICD-10-CM

## 2021-09-23 DIAGNOSIS — Z66 Do not resuscitate: Secondary | ICD-10-CM | POA: Diagnosis not present

## 2021-09-23 DIAGNOSIS — J9621 Acute and chronic respiratory failure with hypoxia: Secondary | ICD-10-CM

## 2021-09-23 DIAGNOSIS — Z885 Allergy status to narcotic agent status: Secondary | ICD-10-CM | POA: Diagnosis not present

## 2021-09-23 DIAGNOSIS — Z8249 Family history of ischemic heart disease and other diseases of the circulatory system: Secondary | ICD-10-CM

## 2021-09-23 DIAGNOSIS — I10 Essential (primary) hypertension: Secondary | ICD-10-CM | POA: Diagnosis present

## 2021-09-23 DIAGNOSIS — M810 Age-related osteoporosis without current pathological fracture: Secondary | ICD-10-CM | POA: Diagnosis present

## 2021-09-23 DIAGNOSIS — Z20822 Contact with and (suspected) exposure to covid-19: Secondary | ICD-10-CM | POA: Diagnosis present

## 2021-09-23 DIAGNOSIS — Z7951 Long term (current) use of inhaled steroids: Secondary | ICD-10-CM | POA: Diagnosis not present

## 2021-09-23 DIAGNOSIS — E876 Hypokalemia: Secondary | ICD-10-CM | POA: Diagnosis present

## 2021-09-23 DIAGNOSIS — R739 Hyperglycemia, unspecified: Secondary | ICD-10-CM | POA: Diagnosis present

## 2021-09-23 DIAGNOSIS — I469 Cardiac arrest, cause unspecified: Secondary | ICD-10-CM | POA: Diagnosis not present

## 2021-09-23 DIAGNOSIS — I468 Cardiac arrest due to other underlying condition: Secondary | ICD-10-CM | POA: Diagnosis present

## 2021-09-23 LAB — CBC WITH DIFFERENTIAL/PLATELET
Abs Immature Granulocytes: 1 10*3/uL — ABNORMAL HIGH (ref 0.00–0.07)
Basophils Absolute: 0 10*3/uL (ref 0.0–0.1)
Basophils Relative: 0 %
Eosinophils Absolute: 1.3 10*3/uL — ABNORMAL HIGH (ref 0.0–0.5)
Eosinophils Relative: 4 %
HCT: 45.3 % (ref 36.0–46.0)
Hemoglobin: 13.8 g/dL (ref 12.0–15.0)
Lymphocytes Relative: 28 %
Lymphs Abs: 9.1 10*3/uL — ABNORMAL HIGH (ref 0.7–4.0)
MCH: 32.1 pg (ref 26.0–34.0)
MCHC: 30.5 g/dL (ref 30.0–36.0)
MCV: 105.3 fL — ABNORMAL HIGH (ref 80.0–100.0)
Monocytes Absolute: 0.3 10*3/uL (ref 0.1–1.0)
Monocytes Relative: 1 %
Myelocytes: 2 %
Neutro Abs: 20.9 10*3/uL — ABNORMAL HIGH (ref 1.7–7.7)
Neutrophils Relative %: 64 %
Platelets: 187 10*3/uL (ref 150–400)
Promyelocytes Relative: 1 %
RBC: 4.3 MIL/uL (ref 3.87–5.11)
RDW: 13.4 % (ref 11.5–15.5)
WBC: 32.6 10*3/uL — ABNORMAL HIGH (ref 4.0–10.5)
nRBC: 0 /100 WBC
nRBC: 0.1 % (ref 0.0–0.2)

## 2021-09-23 LAB — I-STAT ARTERIAL BLOOD GAS, ED
Acid-Base Excess: 2 mmol/L (ref 0.0–2.0)
Bicarbonate: 30.5 mmol/L — ABNORMAL HIGH (ref 20.0–28.0)
Calcium, Ion: 1.15 mmol/L (ref 1.15–1.40)
HCT: 37 % (ref 36.0–46.0)
Hemoglobin: 12.6 g/dL (ref 12.0–15.0)
O2 Saturation: 100 %
Patient temperature: 96.3
Potassium: 4.1 mmol/L (ref 3.5–5.1)
Sodium: 140 mmol/L (ref 135–145)
TCO2: 33 mmol/L — ABNORMAL HIGH (ref 22–32)
pCO2 arterial: 64.5 mmHg — ABNORMAL HIGH (ref 32–48)
pH, Arterial: 7.276 — ABNORMAL LOW (ref 7.35–7.45)
pO2, Arterial: 192 mmHg — ABNORMAL HIGH (ref 83–108)

## 2021-09-23 LAB — POCT I-STAT 7, (LYTES, BLD GAS, ICA,H+H)
Acid-Base Excess: 2 mmol/L (ref 0.0–2.0)
Acid-base deficit: 3 mmol/L — ABNORMAL HIGH (ref 0.0–2.0)
Bicarbonate: 24.2 mmol/L (ref 20.0–28.0)
Bicarbonate: 28.7 mmol/L — ABNORMAL HIGH (ref 20.0–28.0)
Calcium, Ion: 1.17 mmol/L (ref 1.15–1.40)
Calcium, Ion: 1.2 mmol/L (ref 1.15–1.40)
HCT: 42 % (ref 36.0–46.0)
HCT: 44 % (ref 36.0–46.0)
Hemoglobin: 14.3 g/dL (ref 12.0–15.0)
Hemoglobin: 15 g/dL (ref 12.0–15.0)
O2 Saturation: 94 %
O2 Saturation: 97 %
Patient temperature: 95.9
Patient temperature: 36.9
Potassium: 3.4 mmol/L — ABNORMAL LOW (ref 3.5–5.1)
Potassium: 3.8 mmol/L (ref 3.5–5.1)
Sodium: 141 mmol/L (ref 135–145)
Sodium: 141 mmol/L (ref 135–145)
TCO2: 26 mmol/L (ref 22–32)
TCO2: 30 mmol/L (ref 22–32)
pCO2 arterial: 48.9 mmHg — ABNORMAL HIGH (ref 32–48)
pCO2 arterial: 50.9 mmHg — ABNORMAL HIGH (ref 32–48)
pH, Arterial: 7.295 — ABNORMAL LOW (ref 7.35–7.45)
pH, Arterial: 7.359 (ref 7.35–7.45)
pO2, Arterial: 76 mmHg — ABNORMAL LOW (ref 83–108)
pO2, Arterial: 95 mmHg (ref 83–108)

## 2021-09-23 LAB — I-STAT CHEM 8, ED
BUN: 16 mg/dL (ref 8–23)
Calcium, Ion: 1.18 mmol/L (ref 1.15–1.40)
Chloride: 104 mmol/L (ref 98–111)
Creatinine, Ser: 0.9 mg/dL (ref 0.44–1.00)
Glucose, Bld: 320 mg/dL — ABNORMAL HIGH (ref 70–99)
HCT: 42 % (ref 36.0–46.0)
Hemoglobin: 14.3 g/dL (ref 12.0–15.0)
Potassium: 4 mmol/L (ref 3.5–5.1)
Sodium: 139 mmol/L (ref 135–145)
TCO2: 27 mmol/L (ref 22–32)

## 2021-09-23 LAB — BLOOD GAS, ARTERIAL
Acid-base deficit: 11.7 mmol/L — ABNORMAL HIGH (ref 0.0–2.0)
Bicarbonate: 25 mmol/L (ref 20.0–28.0)
O2 Saturation: 99.8 %
Patient temperature: 37
pCO2 arterial: >123 mmHg (ref 32–48)
pH, Arterial: <6.95 — CL (ref 7.35–7.45)
pO2, Arterial: 280 mmHg — ABNORMAL HIGH (ref 83–108)

## 2021-09-23 LAB — TROPONIN I (HIGH SENSITIVITY)
Troponin I (High Sensitivity): 67 ng/L — ABNORMAL HIGH (ref <18)
Troponin I (High Sensitivity): 191 ng/L (ref <18)

## 2021-09-23 LAB — COMPREHENSIVE METABOLIC PANEL
ALT: 42 U/L (ref 0–44)
AST: 39 U/L (ref 15–41)
Albumin: 3 g/dL — ABNORMAL LOW (ref 3.5–5.0)
Alkaline Phosphatase: 89 U/L (ref 38–126)
Anion gap: 14 (ref 5–15)
BUN: 13 mg/dL (ref 8–23)
CO2: 21 mmol/L — ABNORMAL LOW (ref 22–32)
Calcium: 8.6 mg/dL — ABNORMAL LOW (ref 8.9–10.3)
Chloride: 105 mmol/L (ref 98–111)
Creatinine, Ser: 1.12 mg/dL — ABNORMAL HIGH (ref 0.44–1.00)
GFR, Estimated: 51 mL/min — ABNORMAL LOW (ref >60)
Glucose, Bld: 333 mg/dL — ABNORMAL HIGH (ref 70–99)
Potassium: 5 mmol/L (ref 3.5–5.1)
Sodium: 140 mmol/L (ref 135–145)
Total Bilirubin: 0.6 mg/dL (ref 0.3–1.2)
Total Protein: 5.4 g/dL — ABNORMAL LOW (ref 6.5–8.1)

## 2021-09-23 LAB — MRSA NEXT GEN BY PCR, NASAL: MRSA by PCR Next Gen: NOT DETECTED

## 2021-09-23 LAB — RESP PANEL BY RT-PCR (FLU A&B, COVID) ARPGX2
Influenza A by PCR: NEGATIVE
Influenza B by PCR: NEGATIVE
SARS Coronavirus 2 by RT PCR: NEGATIVE

## 2021-09-23 LAB — GLUCOSE, CAPILLARY
Glucose-Capillary: 281 mg/dL — ABNORMAL HIGH (ref 70–99)
Glucose-Capillary: 295 mg/dL — ABNORMAL HIGH (ref 70–99)
Glucose-Capillary: 240 mg/dL — ABNORMAL HIGH (ref 70–99)
Glucose-Capillary: 150 mg/dL — ABNORMAL HIGH (ref 70–99)

## 2021-09-23 LAB — RAPID URINE DRUG SCREEN, HOSP PERFORMED
Amphetamines: NOT DETECTED
Barbiturates: NOT DETECTED
Benzodiazepines: NOT DETECTED
Cocaine: NOT DETECTED
Opiates: NOT DETECTED
Tetrahydrocannabinol: NOT DETECTED

## 2021-09-23 LAB — APTT: aPTT: 35 seconds (ref 24–36)

## 2021-09-23 LAB — PROTIME-INR
INR: 1.2 (ref 0.8–1.2)
Prothrombin Time: 14.8 seconds (ref 11.4–15.2)

## 2021-09-23 LAB — BRAIN NATRIURETIC PEPTIDE: B Natriuretic Peptide: 27.9 pg/mL (ref 0.0–100.0)

## 2021-09-23 LAB — LACTIC ACID, PLASMA
Lactic Acid, Venous: 7.6 mmol/L (ref 0.5–1.9)
Lactic Acid, Venous: 4.8 mmol/L (ref 0.5–1.9)

## 2021-09-23 MED ORDER — DOCUSATE SODIUM 50 MG/5ML PO LIQD
100.0000 mg | Freq: Two times a day (BID) | ORAL | Status: DC
  Administered 2021-09-23: 100 mg
  Filled 2021-09-23 (×2): qty 10

## 2021-09-23 MED ORDER — LACTATED RINGERS IV BOLUS
1000.0000 mL | Freq: Once | INTRAVENOUS | Status: AC
  Administered 2021-09-23: 1000 mL via INTRAVENOUS

## 2021-09-23 MED ORDER — SODIUM CHLORIDE 0.9 % IV BOLUS: 1000.0000 mL | Freq: Once | INTRAVENOUS | Status: DC

## 2021-09-23 MED ORDER — CHLORHEXIDINE GLUCONATE CLOTH 2 % EX PADS
6.0000 | MEDICATED_PAD | Freq: Every day | CUTANEOUS | Status: DC
  Administered 2021-09-23 – 2021-09-25 (×3): 6 via TOPICAL

## 2021-09-23 MED ORDER — POLYETHYLENE GLYCOL 3350 17 G PO PACK
17.0000 g | PACK | Freq: Every day | ORAL | Status: DC
  Administered 2021-09-24 – 2021-09-25 (×2): 17 g
  Filled 2021-09-23 (×2): qty 1

## 2021-09-23 MED ORDER — LACTATED RINGERS IV BOLUS: 1000.0000 mL | Freq: Once | INTRAVENOUS | Status: DC

## 2021-09-23 MED ORDER — METHYLPREDNISOLONE SODIUM SUCC 125 MG IJ SOLR
80.0000 mg | Freq: Two times a day (BID) | INTRAMUSCULAR | Status: DC
  Administered 2021-09-23 (×2): 80 mg via INTRAVENOUS
  Filled 2021-09-23 (×3): qty 2
  Filled 2021-09-23: qty 1.28

## 2021-09-23 MED ORDER — ORAL CARE MOUTH RINSE
15.0000 mL | OROMUCOSAL | Status: DC
  Administered 2021-09-23 – 2021-09-25 (×24): 15 mL via OROMUCOSAL

## 2021-09-23 MED ORDER — PANTOPRAZOLE SODIUM 40 MG IV SOLR
40.0000 mg | Freq: Two times a day (BID) | INTRAVENOUS | Status: DC
  Administered 2021-09-23: 40 mg via INTRAVENOUS
  Filled 2021-09-23: qty 10

## 2021-09-23 MED ORDER — ORAL CARE MOUTH RINSE: 15.0000 mL | OROMUCOSAL | Status: DC | PRN

## 2021-09-23 MED ORDER — SODIUM BICARBONATE 8.4 % IV SOLN
50.0000 meq | Freq: Once | INTRAVENOUS | Status: AC
  Administered 2021-09-23: 50 meq via INTRAVENOUS
  Filled 2021-09-23: qty 50

## 2021-09-23 MED ORDER — DOCUSATE SODIUM 100 MG PO CAPS: 100.0000 mg | ORAL_CAPSULE | Freq: Two times a day (BID) | ORAL | Status: DC | PRN

## 2021-09-23 MED ORDER — ENOXAPARIN SODIUM 40 MG/0.4ML IJ SOSY
40.0000 mg | PREFILLED_SYRINGE | INTRAMUSCULAR | Status: DC
  Filled 2021-09-23: qty 0.4

## 2021-09-23 MED ORDER — INSULIN DETEMIR 100 UNIT/ML ~~LOC~~ SOLN
10.0000 [IU] | Freq: Two times a day (BID) | SUBCUTANEOUS | Status: DC
  Administered 2021-09-23 – 2021-09-24 (×2): 10 [IU] via SUBCUTANEOUS
  Filled 2021-09-23 (×5): qty 0.1

## 2021-09-23 MED ORDER — INSULIN ASPART 100 UNIT/ML IJ SOLN
0.0000 [IU] | INTRAMUSCULAR | Status: DC
  Administered 2021-09-23: 11 [IU] via SUBCUTANEOUS
  Administered 2021-09-23: 7 [IU] via SUBCUTANEOUS
  Administered 2021-09-23: 11 [IU] via SUBCUTANEOUS
  Administered 2021-09-23: 3 [IU] via SUBCUTANEOUS
  Administered 2021-09-24: 4 [IU] via SUBCUTANEOUS
  Administered 2021-09-24 (×2): 3 [IU] via SUBCUTANEOUS

## 2021-09-23 MED ORDER — POLYETHYLENE GLYCOL 3350 17 G PO PACK: 17.0000 g | PACK | Freq: Every day | ORAL | Status: DC | PRN

## 2021-09-23 MED ORDER — PROSOURCE TF PO LIQD: 45.0000 mL | Freq: Two times a day (BID) | ORAL | Status: DC

## 2021-09-23 MED ORDER — DOCUSATE SODIUM 50 MG/5ML PO LIQD: 100.0000 mg | Freq: Two times a day (BID) | ORAL | Status: DC | PRN

## 2021-09-23 MED ORDER — IPRATROPIUM-ALBUTEROL 0.5-2.5 (3) MG/3ML IN SOLN
3.0000 mL | Freq: Four times a day (QID) | RESPIRATORY_TRACT | Status: DC
  Administered 2021-09-23 – 2021-09-24 (×6): 3 mL via RESPIRATORY_TRACT
  Filled 2021-09-23 (×7): qty 3

## 2021-09-23 MED ORDER — LABETALOL HCL 5 MG/ML IV SOLN: 10.0000 mg | INTRAVENOUS | Status: DC | PRN

## 2021-09-23 MED ORDER — VITAL AF 1.2 CAL PO LIQD
1000.0000 mL | ORAL | Status: DC
  Administered 2021-09-23 – 2021-09-24 (×2): 1000 mL

## 2021-09-23 MED ORDER — INSULIN ASPART 100 UNIT/ML IJ SOLN
6.0000 [IU] | INTRAMUSCULAR | Status: DC
  Administered 2021-09-23 – 2021-09-24 (×6): 6 [IU] via SUBCUTANEOUS

## 2021-09-23 MED ORDER — DOCUSATE SODIUM 50 MG/5ML PO LIQD: 100.0000 mg | Freq: Two times a day (BID) | ORAL | Status: DC

## 2021-09-23 MED ORDER — FENTANYL CITRATE PF 50 MCG/ML IJ SOSY
25.0000 ug | PREFILLED_SYRINGE | INTRAMUSCULAR | Status: DC | PRN
  Administered 2021-09-23: 100 ug via INTRAVENOUS
  Filled 2021-09-23: qty 2

## 2021-09-23 MED ORDER — NICOTINE 21 MG/24HR TD PT24
21.0000 mg | MEDICATED_PATCH | Freq: Every day | TRANSDERMAL | Status: DC
  Administered 2021-09-23: 21 mg via TRANSDERMAL
  Filled 2021-09-23 (×2): qty 1

## 2021-09-23 MED ORDER — ALBUTEROL SULFATE (2.5 MG/3ML) 0.083% IN NEBU
10.0000 mg/h | INHALATION_SOLUTION | RESPIRATORY_TRACT | Status: AC
  Administered 2021-09-23: 10 mg/h via RESPIRATORY_TRACT
  Filled 2021-09-23: qty 12

## 2021-09-23 MED ORDER — POLYETHYLENE GLYCOL 3350 17 G PO PACK: 17.0000 g | PACK | Freq: Every day | ORAL | Status: DC

## 2021-09-23 MED ORDER — IPRATROPIUM-ALBUTEROL 0.5-2.5 (3) MG/3ML IN SOLN
3.0000 mL | RESPIRATORY_TRACT | Status: DC | PRN
  Administered 2021-09-23 – 2021-09-24 (×3): 3 mL via RESPIRATORY_TRACT
  Filled 2021-09-23 (×3): qty 3

## 2021-09-23 MED ORDER — LACTATED RINGERS IV SOLN: INTRAVENOUS | Status: DC

## 2021-09-23 MED ORDER — PROPOFOL 1000 MG/100ML IV EMUL
5.0000 ug/kg/min | INTRAVENOUS | Status: DC
  Administered 2021-09-23: 5 ug/kg/min via INTRAVENOUS
  Filled 2021-09-23: qty 100

## 2021-09-23 MED ORDER — LABETALOL HCL 5 MG/ML IV SOLN
5.0000 mg | INTRAVENOUS | Status: AC | PRN
  Administered 2021-09-24 (×5): 10 mg via INTRAVENOUS
  Filled 2021-09-23 (×5): qty 4

## 2021-09-23 MED ORDER — FAMOTIDINE 20 MG PO TABS
20.0000 mg | ORAL_TABLET | Freq: Two times a day (BID) | ORAL | Status: DC
  Administered 2021-09-23 – 2021-09-25 (×3): 20 mg
  Filled 2021-09-23 (×4): qty 1

## 2021-09-23 NOTE — Progress Notes (Signed)
Pt transported from TRA A to CT and to 2M05 with RN and NT without any complications.

## 2021-09-23 NOTE — Assessment & Plan Note (Addendum)
Due to arrest.  Evaluation of neurological status clouded by sedation for ventilator synchrony.   - Temperature management targeting normothermia.  - Neuroprognostication at 96 h

## 2021-09-23 NOTE — Assessment & Plan Note (Signed)
Frequent admissions for AECOPD.  Continues to smoke  - Nicotine patch - Resume home medications if/when extubated.

## 2021-09-23 NOTE — Progress Notes (Addendum)
Initial Nutrition Assessment  DOCUMENTATION CODES:   Not applicable  INTERVENTION:   Initiate tube feeding via OG tube: Vital AF 1.2 at 25 ml/h, increase by 10 ml every 4 hours to goal rate of 65 ml/h (1560 ml per day).  Provides 1872 kcal, 117 gm protein, 1265 ml free water daily.  NUTRITION DIAGNOSIS:   Inadequate oral intake related to inability to eat as evidenced by NPO status.  GOAL:   Patient will meet greater than or equal to 90% of their needs  MONITOR:   Vent status, Skin, Labs  REASON FOR ASSESSMENT:   Ventilator, Consult Enteral/tube feeding initiation and management  ASSESSMENT:   76 yo female admitted S/P cardiac arrest likely r/t pulmonary arrest. PMH includes COPD, emphysema, HTN, HLD, neuromuscular D/O, osteoporosis, RMSF, tobacco abuse, thyroid disease.  Discussed patient with RN.  Currently dealing with elevated blood sugars in the upper 200s. Spoke with MD, okay to begin TF.  OG tube in place with tip in the stomach.  MD to add TF coverage insulin.  Patient is currently intubated on ventilator support MV: 13.9 L/min Temp (24hrs), Avg:95.8 F (35.4 C), Min:95.4 F (35.2 C), Max:96.8 F (36 C)  Propofol: 3.2 ml/hr providing 84 kcal from lipid.   Labs reviewed.  CBG: 281-295  Medications reviewed and include Colace, Pepcid, Novolog, Solumedrol, Miralax, propofol. IVF: LR at 75 ml/h   Weight history reviewed.  No significant weight changes noted.   NUTRITION - FOCUSED PHYSICAL EXAM:  Flowsheet Row Most Recent Value  Orbital Region No depletion  Upper Arm Region No depletion  Thoracic and Lumbar Region No depletion  Buccal Region Unable to assess  Temple Region No depletion  Clavicle Bone Region No depletion  Clavicle and Acromion Bone Region No depletion  Scapular Bone Region No depletion  Dorsal Hand No depletion  Patellar Region No depletion  Anterior Thigh Region No depletion  Posterior Calf Region No depletion  Edema (RD  Assessment) None  Hair Reviewed  Eyes Unable to assess  Mouth Unable to assess  Skin Reviewed  Nails Reviewed       Diet Order:   Diet Order             Diet NPO time specified  Diet effective now                   EDUCATION NEEDS:   No education needs have been identified at this time  Skin:  Skin Assessment: Reviewed RN Assessment Skin Integrity Issues:: Other (Comment) Other: MASD perineum; skin tears to R arm and R buttocks  Last BM:  6/23 type 6  Height:   Ht Readings from Last 1 Encounters:  Oct 21, 2021 5\' 5"  (1.651 m)    Weight:   Wt Readings from Last 1 Encounters:  21-Oct-2021 78.7 kg    Ideal Body Weight:  56.8 kg  BMI:  Body mass index is 28.87 kg/m.  Estimated Nutritional Needs:   Kcal:  1800-2000  Protein:  110-130 gm  Fluid:  >/= 1.8 L    Gabriel Rainwater RD, LDN, CNSC Please refer to Amion for contact information.

## 2021-09-24 ENCOUNTER — Inpatient Hospital Stay (HOSPITAL_COMMUNITY): Payer: Medicare HMO

## 2021-09-24 DIAGNOSIS — I469 Cardiac arrest, cause unspecified: Secondary | ICD-10-CM

## 2021-09-24 LAB — COMPREHENSIVE METABOLIC PANEL
ALT: 62 U/L — ABNORMAL HIGH (ref 0–44)
AST: 67 U/L — ABNORMAL HIGH (ref 15–41)
Albumin: 3.3 g/dL — ABNORMAL LOW (ref 3.5–5.0)
Alkaline Phosphatase: 84 U/L (ref 38–126)
Anion gap: 12 (ref 5–15)
BUN: 19 mg/dL (ref 8–23)
CO2: 27 mmol/L (ref 22–32)
Calcium: 8.9 mg/dL (ref 8.9–10.3)
Chloride: 103 mmol/L (ref 98–111)
Creatinine, Ser: 0.98 mg/dL (ref 0.44–1.00)
GFR, Estimated: 59 mL/min — ABNORMAL LOW (ref >60)
Glucose, Bld: 147 mg/dL — ABNORMAL HIGH (ref 70–99)
Potassium: 4.2 mmol/L (ref 3.5–5.1)
Sodium: 142 mmol/L (ref 135–145)
Total Bilirubin: 0.3 mg/dL (ref 0.3–1.2)
Total Protein: 5.7 g/dL — ABNORMAL LOW (ref 6.5–8.1)

## 2021-09-24 LAB — GLUCOSE, CAPILLARY
Glucose-Capillary: 132 mg/dL — ABNORMAL HIGH (ref 70–99)
Glucose-Capillary: 140 mg/dL — ABNORMAL HIGH (ref 70–99)
Glucose-Capillary: 82 mg/dL (ref 70–99)
Glucose-Capillary: 153 mg/dL — ABNORMAL HIGH (ref 70–99)
Glucose-Capillary: 139 mg/dL — ABNORMAL HIGH (ref 70–99)
Glucose-Capillary: 97 mg/dL (ref 70–99)
Glucose-Capillary: 119 mg/dL — ABNORMAL HIGH (ref 70–99)

## 2021-09-24 LAB — CBC WITH DIFFERENTIAL/PLATELET
Abs Immature Granulocytes: 0.64 10*3/uL — ABNORMAL HIGH (ref 0.00–0.07)
Basophils Absolute: 0.1 10*3/uL (ref 0.0–0.1)
Basophils Relative: 0 %
Eosinophils Absolute: 0 10*3/uL (ref 0.0–0.5)
Eosinophils Relative: 0 %
HCT: 43.3 % (ref 36.0–46.0)
Hemoglobin: 14.7 g/dL (ref 12.0–15.0)
Immature Granulocytes: 2 %
Lymphocytes Relative: 2 %
Lymphs Abs: 0.8 10*3/uL (ref 0.7–4.0)
MCH: 31.9 pg (ref 26.0–34.0)
MCHC: 33.9 g/dL (ref 30.0–36.0)
MCV: 93.9 fL (ref 80.0–100.0)
Monocytes Absolute: 1.3 10*3/uL — ABNORMAL HIGH (ref 0.1–1.0)
Monocytes Relative: 3 %
Neutro Abs: 35.6 10*3/uL — ABNORMAL HIGH (ref 1.7–7.7)
Neutrophils Relative %: 93 %
Platelets: 196 10*3/uL (ref 150–400)
RBC: 4.61 MIL/uL (ref 3.87–5.11)
RDW: 13.3 % (ref 11.5–15.5)
WBC: 38.3 10*3/uL — ABNORMAL HIGH (ref 4.0–10.5)
nRBC: 0 % (ref 0.0–0.2)

## 2021-09-24 LAB — TROPONIN I (HIGH SENSITIVITY)
Troponin I (High Sensitivity): 340 ng/L (ref <18)
Troponin I (High Sensitivity): 328 ng/L (ref <18)

## 2021-09-24 LAB — POCT I-STAT 7, (LYTES, BLD GAS, ICA,H+H)
Acid-Base Excess: 6 mmol/L — ABNORMAL HIGH (ref 0.0–2.0)
Bicarbonate: 31.2 mmol/L — ABNORMAL HIGH (ref 20.0–28.0)
Calcium, Ion: 1.23 mmol/L (ref 1.15–1.40)
HCT: 44 % (ref 36.0–46.0)
Hemoglobin: 15 g/dL (ref 12.0–15.0)
O2 Saturation: 98 %
Patient temperature: 36.1
Potassium: 3.8 mmol/L (ref 3.5–5.1)
Sodium: 143 mmol/L (ref 135–145)
TCO2: 33 mmol/L — ABNORMAL HIGH (ref 22–32)
pCO2 arterial: 44.6 mmHg (ref 32–48)
pH, Arterial: 7.449 (ref 7.35–7.45)
pO2, Arterial: 94 mmHg (ref 83–108)

## 2021-09-24 LAB — ECHOCARDIOGRAM COMPLETE
AR max vel: 3.78 cm2
AV Area VTI: 3.64 cm2
AV Area mean vel: 3.6 cm2
AV Mean grad: 4.5 mmHg
AV Peak grad: 8.6 mmHg
Ao pk vel: 1.47 m/s
Area-P 1/2: 3.06 cm2
Height: 65 in
S' Lateral: 2.5 cm
Weight: 2768.98 oz

## 2021-09-24 MED ORDER — PREDNISONE 20 MG PO TABS
40.0000 mg | ORAL_TABLET | Freq: Every day | ORAL | Status: DC
  Administered 2021-09-24 – 2021-09-25 (×2): 40 mg
  Filled 2021-09-24 (×2): qty 2

## 2021-09-24 MED ORDER — SODIUM CHLORIDE 0.9 % IV SOLN
2000.0000 mg | Freq: Once | INTRAVENOUS | Status: AC
  Administered 2021-09-24: 2000 mg via INTRAVENOUS
  Filled 2021-09-24: qty 20

## 2021-09-24 MED ORDER — STERILE WATER FOR INJECTION IJ SOLN
INTRAMUSCULAR | Status: AC
  Filled 2021-09-24: qty 10

## 2021-09-24 MED ORDER — LEVETIRACETAM IN NACL 500 MG/100ML IV SOLN
500.0000 mg | Freq: Two times a day (BID) | INTRAVENOUS | Status: DC
  Administered 2021-09-24: 500 mg via INTRAVENOUS
  Filled 2021-09-24: qty 100

## 2021-09-24 MED ORDER — IPRATROPIUM-ALBUTEROL 0.5-2.5 (3) MG/3ML IN SOLN
3.0000 mL | Freq: Three times a day (TID) | RESPIRATORY_TRACT | Status: DC
  Administered 2021-09-25: 3 mL via RESPIRATORY_TRACT
  Filled 2021-09-24 (×2): qty 3

## 2021-09-24 MED ORDER — LEVOTHYROXINE SODIUM 100 MCG PO TABS
125.0000 ug | ORAL_TABLET | Freq: Every day | ORAL | Status: DC
  Administered 2021-09-24: 125 ug
  Filled 2021-09-24 (×2): qty 1

## 2021-09-24 MED ORDER — LEVETIRACETAM 100 MG/ML PO SOLN
500.0000 mg | Freq: Two times a day (BID) | ORAL | Status: DC
  Administered 2021-09-25: 500 mg
  Filled 2021-09-24 (×2): qty 5

## 2021-09-24 MED ORDER — HYDRALAZINE HCL 20 MG/ML IJ SOLN
10.0000 mg | Freq: Four times a day (QID) | INTRAMUSCULAR | Status: DC | PRN
  Administered 2021-09-24 (×2): 10 mg via INTRAVENOUS
  Filled 2021-09-24 (×2): qty 1

## 2021-09-24 NOTE — Progress Notes (Signed)
Patient vomiting small amounts of tan substance suspicious to be tube feeding. OG tube tape in place but loose and soiled from emesis. OG tube appears loose and to have moved. OG tube removed. New OG tube placed.   Dr. Everardo All and Robbie Louis, [facility] notified as patient received tube feed coverage insulin prior to this event and tube feeds are on hold at this time. Plan to recheck CBG in one hour.   09/24/21 4:57 PM Everlean Patterson, RN

## 2021-09-25 ENCOUNTER — Encounter (HOSPITAL_COMMUNITY): Payer: Self-pay | Admitting: Pulmonary Disease

## 2021-09-25 DIAGNOSIS — G936 Cerebral edema: Secondary | ICD-10-CM

## 2021-09-25 DIAGNOSIS — G931 Anoxic brain damage, not elsewhere classified: Secondary | ICD-10-CM

## 2021-09-25 DIAGNOSIS — R569 Unspecified convulsions: Secondary | ICD-10-CM

## 2021-09-25 DIAGNOSIS — I469 Cardiac arrest, cause unspecified: Secondary | ICD-10-CM | POA: Diagnosis not present

## 2021-09-25 HISTORY — DX: Unspecified convulsions: R56.9

## 2021-09-25 LAB — BASIC METABOLIC PANEL
Anion gap: 10 (ref 5–15)
BUN: 20 mg/dL (ref 8–23)
CO2: 30 mmol/L (ref 22–32)
Calcium: 9.1 mg/dL (ref 8.9–10.3)
Chloride: 108 mmol/L (ref 98–111)
Creatinine, Ser: 0.73 mg/dL (ref 0.44–1.00)
GFR, Estimated: >60 mL/min (ref >60)
Glucose, Bld: 120 mg/dL — ABNORMAL HIGH (ref 70–99)
Potassium: 4.5 mmol/L (ref 3.5–5.1)
Sodium: 148 mmol/L — ABNORMAL HIGH (ref 135–145)

## 2021-09-25 LAB — CBC WITH DIFFERENTIAL/PLATELET
Abs Immature Granulocytes: 0 10*3/uL (ref 0.00–0.07)
Basophils Absolute: 0 10*3/uL (ref 0.0–0.1)
Basophils Relative: 0 %
Eosinophils Absolute: 0.4 10*3/uL (ref 0.0–0.5)
Eosinophils Relative: 1 %
HCT: 49 % — ABNORMAL HIGH (ref 36.0–46.0)
Hemoglobin: 16.2 g/dL — ABNORMAL HIGH (ref 12.0–15.0)
Lymphocytes Relative: 0 %
Lymphs Abs: 0 10*3/uL — ABNORMAL LOW (ref 0.7–4.0)
MCH: 31 pg (ref 26.0–34.0)
MCHC: 33.1 g/dL (ref 30.0–36.0)
MCV: 93.9 fL (ref 80.0–100.0)
Monocytes Absolute: 2.5 10*3/uL — ABNORMAL HIGH (ref 0.1–1.0)
Monocytes Relative: 7 %
Neutro Abs: 33.1 10*3/uL — ABNORMAL HIGH (ref 1.7–7.7)
Neutrophils Relative %: 92 %
Platelets: 187 10*3/uL (ref 150–400)
RBC: 5.22 MIL/uL — ABNORMAL HIGH (ref 3.87–5.11)
RDW: 14.1 % (ref 11.5–15.5)
WBC: 36 10*3/uL — ABNORMAL HIGH (ref 4.0–10.5)
nRBC: 0 % (ref 0.0–0.2)
nRBC: 1 /100 WBC — ABNORMAL HIGH

## 2021-09-25 LAB — GLUCOSE, CAPILLARY
Glucose-Capillary: 114 mg/dL — ABNORMAL HIGH (ref 70–99)
Glucose-Capillary: 106 mg/dL — ABNORMAL HIGH (ref 70–99)
Glucose-Capillary: 106 mg/dL — ABNORMAL HIGH (ref 70–99)

## 2021-09-25 LAB — MAGNESIUM: Magnesium: 2.4 mg/dL (ref 1.7–2.4)

## 2021-09-25 MED ORDER — DEXTROSE 5 % IV SOLN: INTRAVENOUS | Status: DC

## 2021-09-25 MED ORDER — FENTANYL BOLUS VIA INFUSION: 100.0000 ug | INTRAVENOUS | Status: DC | PRN

## 2021-09-25 MED ORDER — ACETAMINOPHEN 325 MG PO TABS
650.0000 mg | ORAL_TABLET | Freq: Four times a day (QID) | ORAL | Status: DC | PRN
Start: 2021-09-25 — End: 2021-09-25

## 2021-09-25 MED ORDER — PROPOFOL 1000 MG/100ML IV EMUL: 5.0000 ug/kg/min | INTRAVENOUS | Status: DC

## 2021-09-25 MED ORDER — FENTANYL CITRATE (PF) 100 MCG/2ML IJ SOLN
50.0000 ug | INTRAMUSCULAR | Status: DC | PRN
  Administered 2021-09-25 (×2): 50 ug via INTRAVENOUS
  Filled 2021-09-25 (×2): qty 2

## 2021-09-25 MED ORDER — ACETAMINOPHEN 650 MG RE SUPP: 650.0000 mg | Freq: Four times a day (QID) | RECTAL | Status: DC | PRN

## 2021-09-25 MED ORDER — BUDESONIDE 0.25 MG/2ML IN SUSP: 0.2500 mg | Freq: Two times a day (BID) | RESPIRATORY_TRACT | Status: DC

## 2021-09-25 MED ORDER — POLYVINYL ALCOHOL 1.4 % OP SOLN
1.0000 [drp] | Freq: Four times a day (QID) | OPHTHALMIC | Status: DC | PRN
Start: 2021-09-25 — End: 2021-09-25
  Filled 2021-09-25: qty 15

## 2021-09-25 MED ORDER — ARFORMOTEROL TARTRATE 15 MCG/2ML IN NEBU
15.0000 ug | INHALATION_SOLUTION | Freq: Two times a day (BID) | RESPIRATORY_TRACT | Status: DC
  Filled 2021-09-25 (×2): qty 2

## 2021-09-25 MED ORDER — FENTANYL 2500MCG IN NS 250ML (10MCG/ML) PREMIX INFUSION
0.0000 ug/h | INTRAVENOUS | Status: DC
  Administered 2021-09-25: 50 ug/h via INTRAVENOUS
  Filled 2021-09-25: qty 250

## 2021-09-25 MED ORDER — GLYCOPYRROLATE 1 MG PO TABS: 1.0000 mg | ORAL_TABLET | ORAL | Status: DC | PRN

## 2021-09-25 MED ORDER — LORAZEPAM 2 MG/ML IJ SOLN
2.0000 mg | Freq: Once | INTRAMUSCULAR | Status: AC
  Administered 2021-09-25: 2 mg via INTRAVENOUS
  Filled 2021-09-25: qty 1

## 2021-09-25 MED ORDER — ACETAMINOPHEN 160 MG/5ML PO SOLN
650.0000 mg | Freq: Four times a day (QID) | ORAL | Status: DC | PRN
  Administered 2021-09-25: 650 mg
  Filled 2021-09-25: qty 20.3

## 2021-09-25 MED ORDER — GLYCOPYRROLATE 0.2 MG/ML IJ SOLN
0.2000 mg | INTRAMUSCULAR | Status: DC | PRN
  Administered 2021-09-25: 0.2 mg via INTRAVENOUS
  Filled 2021-09-25: qty 1

## 2021-09-25 MED ORDER — FENTANYL CITRATE (PF) 100 MCG/2ML IJ SOLN: 50.0000 ug | INTRAMUSCULAR | Status: DC | PRN

## 2021-09-25 MED ORDER — DIPHENHYDRAMINE HCL 50 MG/ML IJ SOLN: 25.0000 mg | INTRAMUSCULAR | Status: DC | PRN

## 2021-09-25 MED ORDER — GLYCOPYRROLATE 0.2 MG/ML IJ SOLN: 0.2000 mg | INTRAMUSCULAR | Status: DC | PRN

## 2021-09-26 ENCOUNTER — Other Ambulatory Visit: Payer: Self-pay | Admitting: Internal Medicine

## 2021-09-26 ENCOUNTER — Encounter (HOSPITAL_BASED_OUTPATIENT_CLINIC_OR_DEPARTMENT_OTHER): Payer: Self-pay | Admitting: Nurse Practitioner

## 2021-09-26 ENCOUNTER — Other Ambulatory Visit (HOSPITAL_BASED_OUTPATIENT_CLINIC_OR_DEPARTMENT_OTHER): Payer: Self-pay

## 2021-09-28 LAB — CULTURE, BLOOD (ROUTINE X 2)
Culture: NO GROWTH
Culture: NO GROWTH
Special Requests: ADEQUATE

## 2021-10-01 NOTE — Progress Notes (Signed)
LTM EEG discontinued -  skin breakdown at F7,F8  RN notified at [facility].

## 2021-10-01 DEATH — deceased

## 2021-10-06 ENCOUNTER — Ambulatory Visit: Payer: Medicare HMO | Admitting: Psychologist

## 2021-10-11 ENCOUNTER — Other Ambulatory Visit: Payer: Medicare HMO

## 2021-10-17 ENCOUNTER — Ambulatory Visit: Payer: Medicare HMO | Admitting: Psychologist

## 2022-01-03 ENCOUNTER — Ambulatory Visit (HOSPITAL_BASED_OUTPATIENT_CLINIC_OR_DEPARTMENT_OTHER): Payer: Medicare HMO | Admitting: Nurse Practitioner

## 2022-01-17 ENCOUNTER — Ambulatory Visit: Payer: Medicare HMO | Admitting: Internal Medicine

## 2022-03-08 IMAGING — MG DIGITAL SCREENING BILAT W/ TOMO W/ CAD
8 series · 8 of 24 positions shown · non-contrast
Comparison: Previous exam(s).

CLINICAL DATA: Screening.

EXAM:
DIGITAL SCREENING BILATERAL MAMMOGRAM WITH TOMO AND CAD

[L MLO synth-2D]
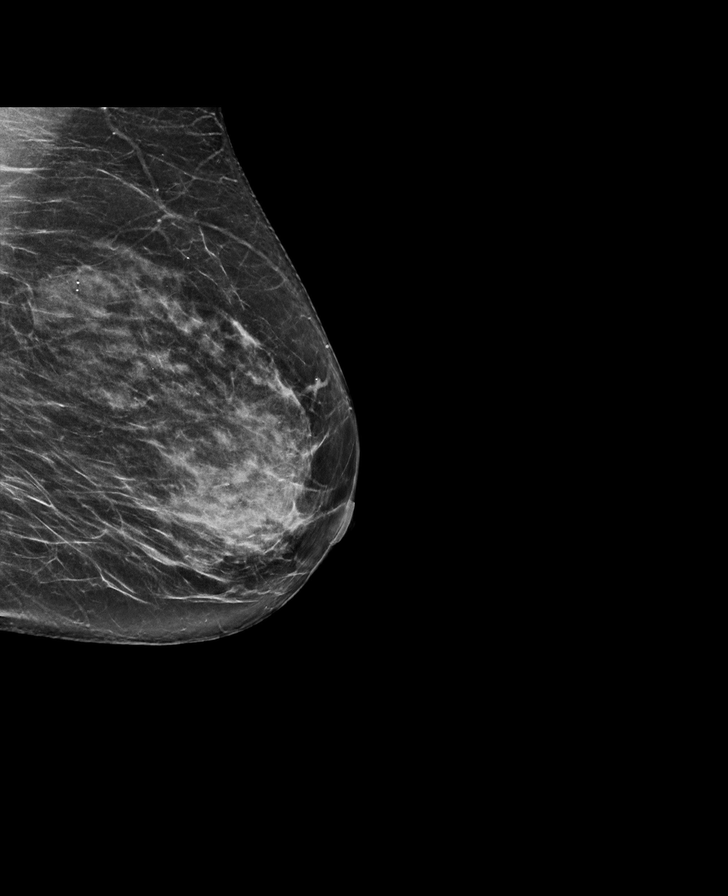

[L CC synth-2D]
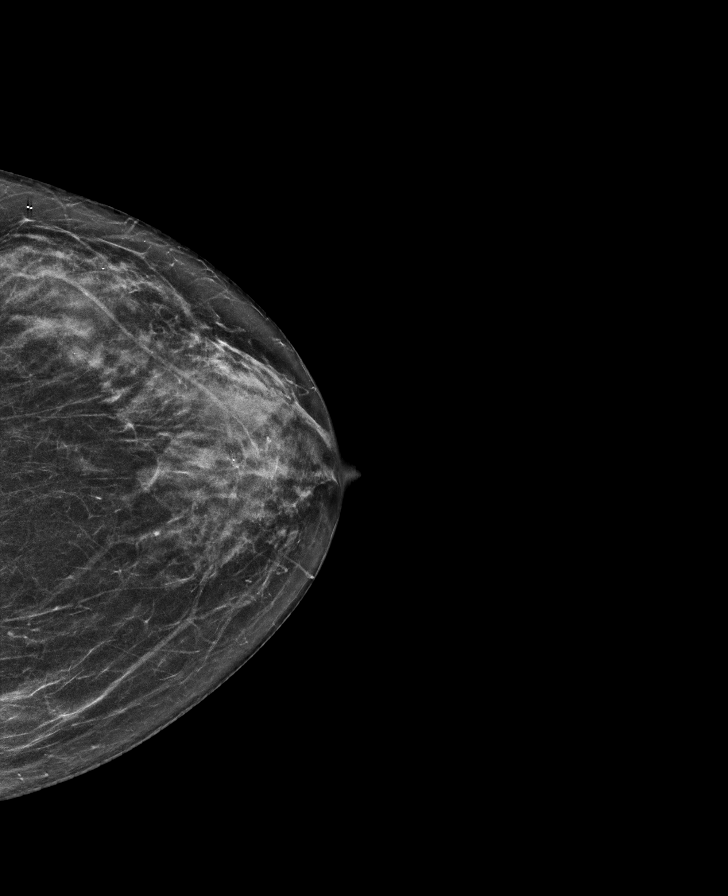

[R MLO synth-2D]
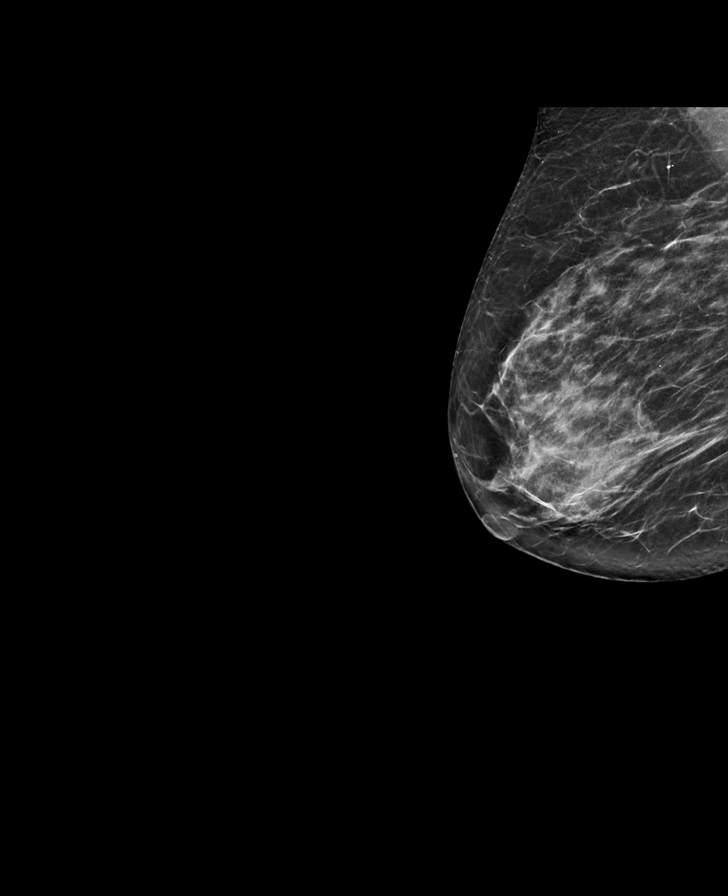

[R CC synth-2D]
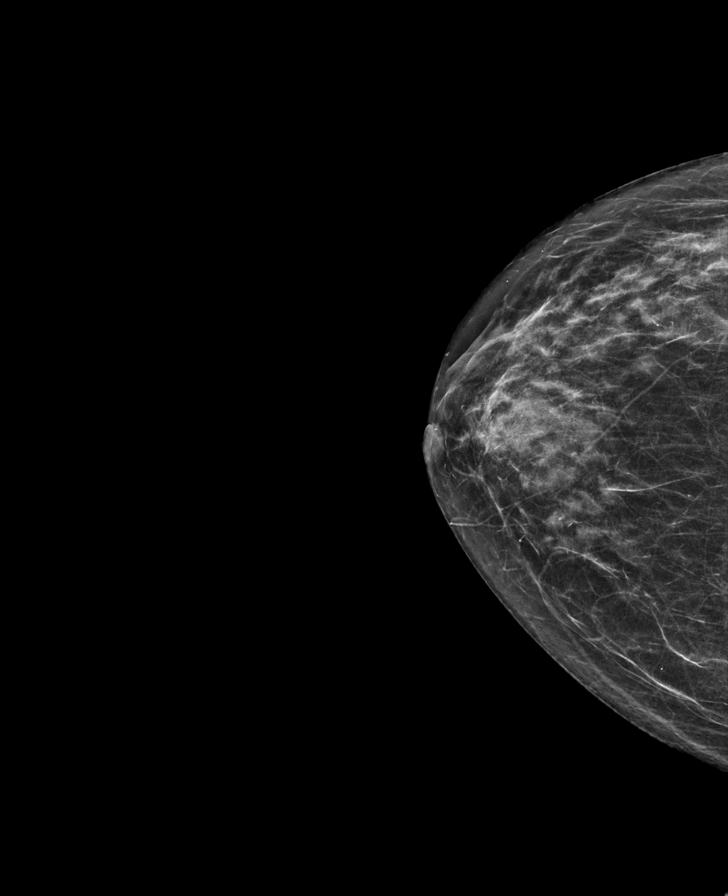

[L CC tomo · tomo slice 29/58.0]
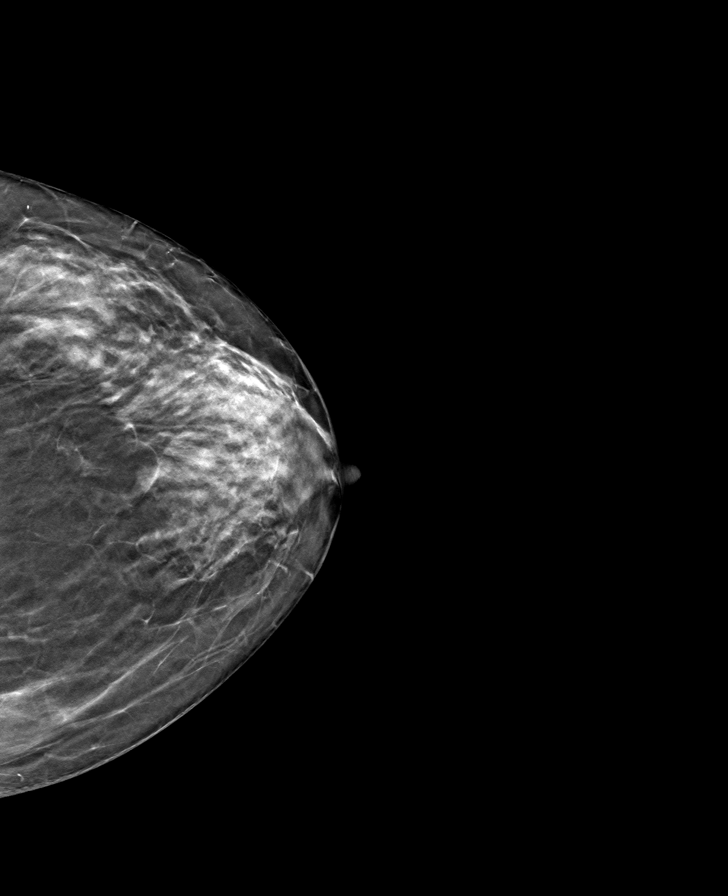

[R MLO tomo · tomo slice 33/64.0]
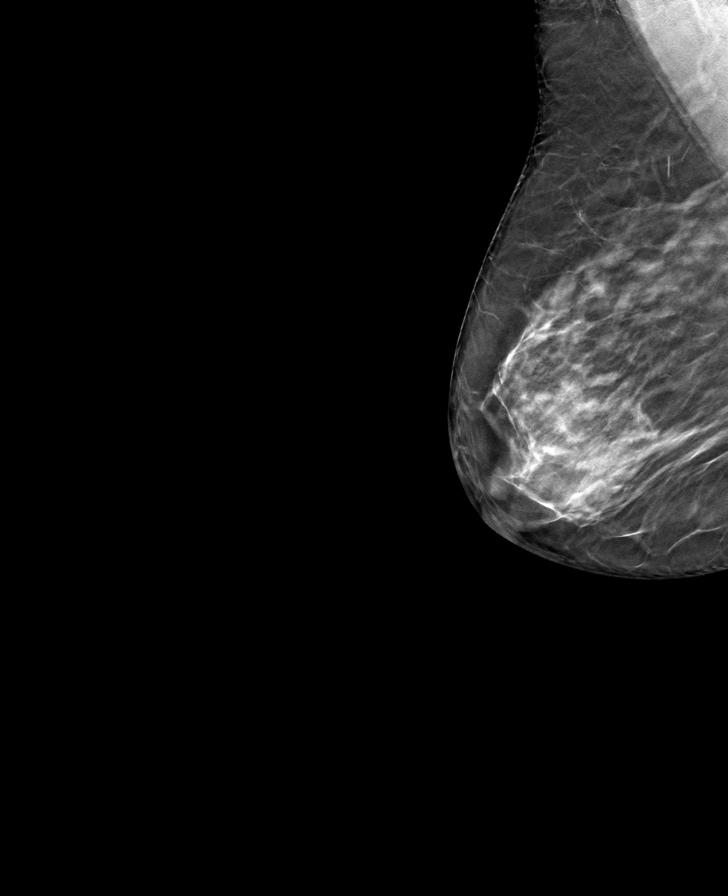

[R CC tomo · tomo slice 27/54.0]
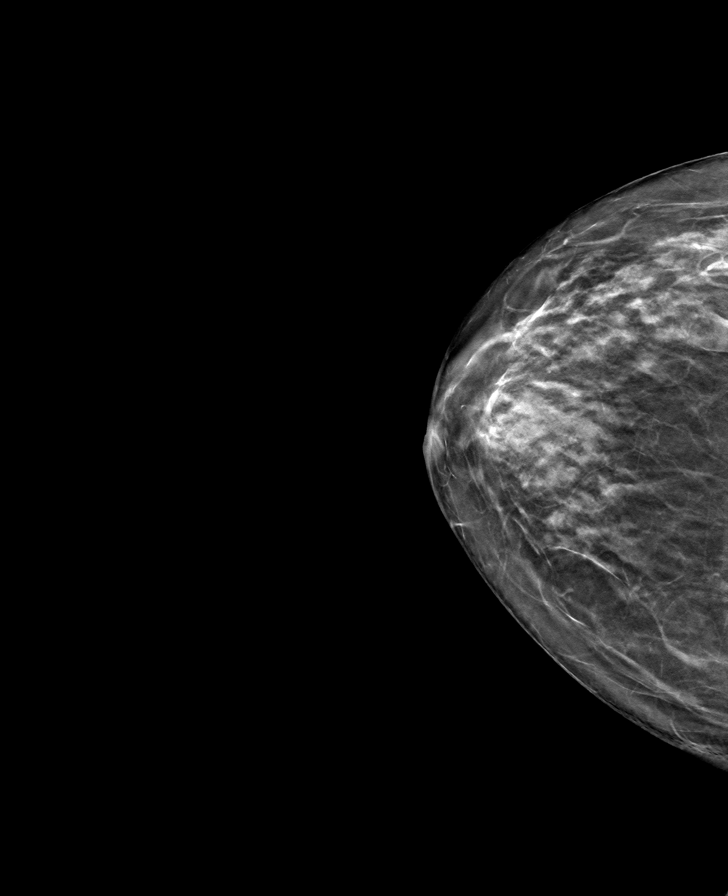

[L MLO tomo · tomo slice 34/67.0]
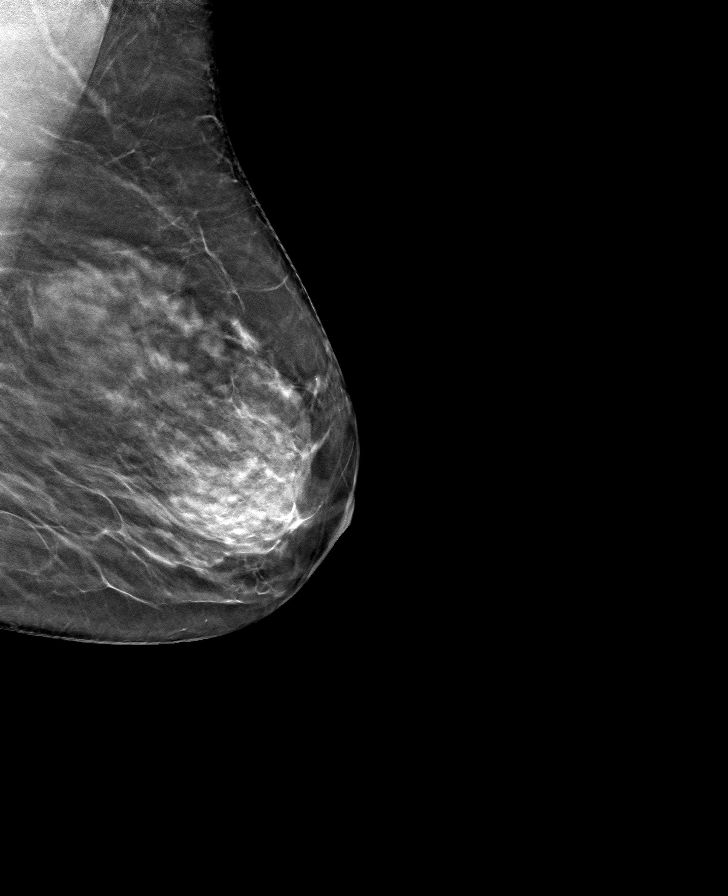

[8 of 24 positions shown; findings below may reference images not displayed]

ACR Breast Density Category c: The breast tissue is heterogeneously
dense, which may obscure small masses.
FINDINGS: There are no findings suspicious for malignancy. Images were
processed with CAD.
IMPRESSION: No mammographic evidence of malignancy. A result letter of this
screening mammogram will be mailed directly to the patient.

RECOMMENDATION:
Screening mammogram in one year. (Code:FT-[id number])

BI-RADS CATEGORY  1: Negative.

## 2022-09-18 ENCOUNTER — Ambulatory Visit (HOSPITAL_BASED_OUTPATIENT_CLINIC_OR_DEPARTMENT_OTHER): Payer: Medicare HMO | Admitting: Nurse Practitioner
